# Patient Record
Sex: Female | Born: 1935 | Race: Asian | Hispanic: No | Smoking: Never smoker
Health system: Southern US, Community
[De-identification: ages and names within clinical notes are randomized; demographics above are authoritative.]

## PROBLEM LIST (undated history)

## (undated) DIAGNOSIS — M545 Low back pain, unspecified: Secondary | ICD-10-CM

## (undated) DIAGNOSIS — IMO0001 Reserved for inherently not codable concepts without codable children: Secondary | ICD-10-CM

## (undated) DIAGNOSIS — Z90710 Acquired absence of both cervix and uterus: Secondary | ICD-10-CM

## (undated) DIAGNOSIS — B1910 Unspecified viral hepatitis B without hepatic coma: Secondary | ICD-10-CM

## (undated) DIAGNOSIS — M4800 Spinal stenosis, site unspecified: Secondary | ICD-10-CM

## (undated) DIAGNOSIS — J449 Chronic obstructive pulmonary disease, unspecified: Secondary | ICD-10-CM

## (undated) DIAGNOSIS — E785 Hyperlipidemia, unspecified: Secondary | ICD-10-CM

## (undated) DIAGNOSIS — K5790 Diverticulosis of intestine, part unspecified, without perforation or abscess without bleeding: Secondary | ICD-10-CM

## (undated) DIAGNOSIS — N390 Urinary tract infection, site not specified: Secondary | ICD-10-CM

## (undated) DIAGNOSIS — I509 Heart failure, unspecified: Secondary | ICD-10-CM

## (undated) DIAGNOSIS — N184 Chronic kidney disease, stage 4 (severe): Secondary | ICD-10-CM

## (undated) DIAGNOSIS — I10 Essential (primary) hypertension: Secondary | ICD-10-CM

## (undated) DIAGNOSIS — N879 Dysplasia of cervix uteri, unspecified: Secondary | ICD-10-CM

## (undated) DIAGNOSIS — I2510 Atherosclerotic heart disease of native coronary artery without angina pectoris: Secondary | ICD-10-CM

## (undated) DIAGNOSIS — G8929 Other chronic pain: Secondary | ICD-10-CM

## (undated) DIAGNOSIS — K219 Gastro-esophageal reflux disease without esophagitis: Secondary | ICD-10-CM

## (undated) DIAGNOSIS — N952 Postmenopausal atrophic vaginitis: Secondary | ICD-10-CM

## (undated) DIAGNOSIS — E119 Type 2 diabetes mellitus without complications: Secondary | ICD-10-CM

## (undated) HISTORY — PX: BACK SURGERY: SHX140

## (undated) HISTORY — DX: Gastro-esophageal reflux disease without esophagitis: K21.9

## (undated) HISTORY — DX: Hyperlipidemia, unspecified: E78.5

## (undated) HISTORY — PX: CARDIAC CATHETERIZATION: SHX172

## (undated) HISTORY — DX: Urinary tract infection, site not specified: N39.0

## (undated) HISTORY — DX: Postmenopausal atrophic vaginitis: N95.2

## (undated) HISTORY — DX: Unspecified viral hepatitis B without hepatic coma: B19.10

## (undated) HISTORY — DX: Type 2 diabetes mellitus without complications: E11.9

## (undated) HISTORY — DX: Acquired absence of both cervix and uterus: Z90.710

## (undated) HISTORY — PX: TOTAL ABDOMINAL HYSTERECTOMY: SHX209

## (undated) HISTORY — DX: Spinal stenosis, site unspecified: M48.00

## (undated) HISTORY — DX: Dysplasia of cervix uteri, unspecified: N87.9

## (undated) HISTORY — DX: Atherosclerotic heart disease of native coronary artery without angina pectoris: I25.10

## (undated) HISTORY — DX: Essential (primary) hypertension: I10

---

## 1997-11-20 ENCOUNTER — Ambulatory Visit: Admission: RE | Admit: 1997-11-20 | Discharge: 1997-11-20 | Payer: Self-pay | Admitting: Gynecologic Oncology

## 1998-05-23 ENCOUNTER — Encounter: Payer: Self-pay | Admitting: Emergency Medicine

## 1998-05-23 ENCOUNTER — Emergency Department (HOSPITAL_COMMUNITY): Admission: EM | Admit: 1998-05-23 | Discharge: 1998-05-23 | Payer: Self-pay | Admitting: Emergency Medicine

## 1998-05-24 ENCOUNTER — Encounter: Payer: Self-pay | Admitting: Emergency Medicine

## 1998-07-02 ENCOUNTER — Ambulatory Visit (HOSPITAL_COMMUNITY): Admission: RE | Admit: 1998-07-02 | Discharge: 1998-07-02 | Payer: Self-pay | Admitting: Orthopedic Surgery

## 1998-08-05 ENCOUNTER — Ambulatory Visit: Admission: RE | Admit: 1998-08-05 | Discharge: 1998-08-05 | Payer: Self-pay | Admitting: Gynecologic Oncology

## 1998-08-06 ENCOUNTER — Other Ambulatory Visit: Admission: RE | Admit: 1998-08-06 | Discharge: 1998-08-06 | Payer: Self-pay | Admitting: Gynecologic Oncology

## 1998-10-15 ENCOUNTER — Emergency Department (HOSPITAL_COMMUNITY): Admission: EM | Admit: 1998-10-15 | Discharge: 1998-10-15 | Payer: Self-pay | Admitting: Emergency Medicine

## 1998-10-15 ENCOUNTER — Encounter: Payer: Self-pay | Admitting: Emergency Medicine

## 1998-10-20 ENCOUNTER — Encounter: Admission: RE | Admit: 1998-10-20 | Discharge: 1998-10-20 | Payer: Self-pay | Admitting: Internal Medicine

## 1999-05-07 ENCOUNTER — Encounter: Admission: RE | Admit: 1999-05-07 | Discharge: 1999-05-07 | Payer: Self-pay | Admitting: Hematology and Oncology

## 1999-05-21 ENCOUNTER — Encounter: Admission: RE | Admit: 1999-05-21 | Discharge: 1999-05-21 | Payer: Self-pay | Admitting: Internal Medicine

## 1999-05-25 ENCOUNTER — Encounter: Admission: RE | Admit: 1999-05-25 | Discharge: 1999-05-25 | Payer: Self-pay | Admitting: Internal Medicine

## 1999-06-10 ENCOUNTER — Encounter: Admission: RE | Admit: 1999-06-10 | Discharge: 1999-06-10 | Payer: Self-pay | Admitting: Internal Medicine

## 1999-08-06 ENCOUNTER — Encounter: Admission: RE | Admit: 1999-08-06 | Discharge: 1999-08-06 | Payer: Self-pay | Admitting: Internal Medicine

## 1999-08-18 ENCOUNTER — Other Ambulatory Visit: Admission: RE | Admit: 1999-08-18 | Discharge: 1999-08-18 | Payer: Self-pay | Admitting: Gynecologic Oncology

## 1999-08-18 ENCOUNTER — Ambulatory Visit: Admission: RE | Admit: 1999-08-18 | Discharge: 1999-08-18 | Payer: Self-pay | Admitting: Gynecologic Oncology

## 2000-01-14 ENCOUNTER — Ambulatory Visit (HOSPITAL_COMMUNITY): Admission: RE | Admit: 2000-01-14 | Discharge: 2000-01-14 | Payer: Self-pay | Admitting: Orthopedic Surgery

## 2000-01-14 ENCOUNTER — Encounter: Payer: Self-pay | Admitting: Orthopedic Surgery

## 2000-02-01 HISTORY — PX: LUMBAR LAMINECTOMY/DECOMPRESSION MICRODISCECTOMY: SHX5026

## 2000-02-26 ENCOUNTER — Encounter: Payer: Self-pay | Admitting: Orthopedic Surgery

## 2000-02-29 ENCOUNTER — Inpatient Hospital Stay (HOSPITAL_COMMUNITY): Admission: RE | Admit: 2000-02-29 | Discharge: 2000-03-03 | Payer: Self-pay | Admitting: Orthopedic Surgery

## 2000-02-29 ENCOUNTER — Encounter: Payer: Self-pay | Admitting: Orthopedic Surgery

## 2000-06-21 ENCOUNTER — Ambulatory Visit (HOSPITAL_COMMUNITY): Admission: RE | Admit: 2000-06-21 | Discharge: 2000-06-21 | Payer: Self-pay

## 2000-06-21 ENCOUNTER — Encounter: Admission: RE | Admit: 2000-06-21 | Discharge: 2000-06-21 | Payer: Self-pay

## 2001-01-11 ENCOUNTER — Encounter: Admission: RE | Admit: 2001-01-11 | Discharge: 2001-01-11 | Payer: Self-pay | Admitting: Internal Medicine

## 2001-01-16 ENCOUNTER — Encounter: Admission: RE | Admit: 2001-01-16 | Discharge: 2001-01-16 | Payer: Self-pay | Admitting: Internal Medicine

## 2001-02-13 ENCOUNTER — Encounter: Admission: RE | Admit: 2001-02-13 | Discharge: 2001-02-13 | Payer: Self-pay | Admitting: Internal Medicine

## 2001-02-23 ENCOUNTER — Encounter: Admission: RE | Admit: 2001-02-23 | Discharge: 2001-02-23 | Payer: Self-pay | Admitting: *Deleted

## 2001-03-08 ENCOUNTER — Ambulatory Visit (HOSPITAL_COMMUNITY): Admission: RE | Admit: 2001-03-08 | Discharge: 2001-03-08 | Payer: Self-pay | Admitting: *Deleted

## 2001-03-16 ENCOUNTER — Encounter: Admission: RE | Admit: 2001-03-16 | Discharge: 2001-03-16 | Payer: Self-pay | Admitting: Internal Medicine

## 2001-05-22 ENCOUNTER — Encounter: Admission: RE | Admit: 2001-05-22 | Discharge: 2001-05-22 | Payer: Self-pay | Admitting: Internal Medicine

## 2001-06-12 ENCOUNTER — Emergency Department (HOSPITAL_COMMUNITY): Admission: EM | Admit: 2001-06-12 | Discharge: 2001-06-12 | Payer: Self-pay | Admitting: Emergency Medicine

## 2001-06-12 ENCOUNTER — Encounter: Payer: Self-pay | Admitting: Emergency Medicine

## 2001-08-23 ENCOUNTER — Encounter: Admission: RE | Admit: 2001-08-23 | Discharge: 2001-08-23 | Payer: Self-pay

## 2002-04-17 ENCOUNTER — Encounter: Admission: RE | Admit: 2002-04-17 | Discharge: 2002-04-17 | Payer: Self-pay | Admitting: Internal Medicine

## 2002-10-26 ENCOUNTER — Emergency Department (HOSPITAL_COMMUNITY): Admission: EM | Admit: 2002-10-26 | Discharge: 2002-10-26 | Payer: Self-pay | Admitting: Emergency Medicine

## 2002-10-27 ENCOUNTER — Emergency Department (HOSPITAL_COMMUNITY): Admission: AD | Admit: 2002-10-27 | Discharge: 2002-10-27 | Payer: Self-pay

## 2002-10-28 ENCOUNTER — Emergency Department (HOSPITAL_COMMUNITY): Admission: EM | Admit: 2002-10-28 | Discharge: 2002-10-28 | Payer: Self-pay | Admitting: Emergency Medicine

## 2002-12-29 ENCOUNTER — Emergency Department (HOSPITAL_COMMUNITY): Admission: EM | Admit: 2002-12-29 | Discharge: 2002-12-29 | Payer: Self-pay

## 2003-01-02 ENCOUNTER — Encounter: Admission: RE | Admit: 2003-01-02 | Discharge: 2003-01-02 | Payer: Self-pay | Admitting: Internal Medicine

## 2003-01-07 ENCOUNTER — Emergency Department (HOSPITAL_COMMUNITY): Admission: AD | Admit: 2003-01-07 | Discharge: 2003-01-08 | Payer: Self-pay | Admitting: Emergency Medicine

## 2003-01-07 ENCOUNTER — Encounter: Payer: Self-pay | Admitting: Emergency Medicine

## 2003-01-25 ENCOUNTER — Encounter: Admission: RE | Admit: 2003-01-25 | Discharge: 2003-01-25 | Payer: Self-pay | Admitting: Internal Medicine

## 2003-02-04 ENCOUNTER — Encounter: Admission: RE | Admit: 2003-02-04 | Discharge: 2003-02-04 | Payer: Self-pay | Admitting: Internal Medicine

## 2003-03-07 ENCOUNTER — Encounter: Admission: RE | Admit: 2003-03-07 | Discharge: 2003-03-07 | Payer: Self-pay | Admitting: Internal Medicine

## 2003-07-08 ENCOUNTER — Emergency Department (HOSPITAL_COMMUNITY): Admission: EM | Admit: 2003-07-08 | Discharge: 2003-07-08 | Payer: Self-pay | Admitting: Family Medicine

## 2003-07-09 ENCOUNTER — Emergency Department (HOSPITAL_COMMUNITY): Admission: EM | Admit: 2003-07-09 | Discharge: 2003-07-10 | Payer: Self-pay | Admitting: Family Medicine

## 2003-07-12 ENCOUNTER — Ambulatory Visit (HOSPITAL_COMMUNITY): Admission: RE | Admit: 2003-07-12 | Discharge: 2003-07-12 | Payer: Self-pay | Admitting: Emergency Medicine

## 2003-08-04 ENCOUNTER — Encounter: Admission: RE | Admit: 2003-08-04 | Discharge: 2003-08-04 | Payer: Self-pay | Admitting: Orthopedic Surgery

## 2003-08-29 ENCOUNTER — Encounter: Admission: RE | Admit: 2003-08-29 | Discharge: 2003-08-29 | Payer: Self-pay | Admitting: Internal Medicine

## 2004-02-08 ENCOUNTER — Emergency Department (HOSPITAL_COMMUNITY): Admission: EM | Admit: 2004-02-08 | Discharge: 2004-02-09 | Payer: Self-pay | Admitting: Emergency Medicine

## 2004-09-17 ENCOUNTER — Emergency Department (HOSPITAL_COMMUNITY): Admission: EM | Admit: 2004-09-17 | Discharge: 2004-09-17 | Payer: Self-pay | Admitting: Family Medicine

## 2004-09-22 ENCOUNTER — Ambulatory Visit: Payer: Self-pay | Admitting: Internal Medicine

## 2004-09-25 ENCOUNTER — Ambulatory Visit: Payer: Self-pay | Admitting: Internal Medicine

## 2004-11-12 ENCOUNTER — Ambulatory Visit: Payer: Self-pay | Admitting: Internal Medicine

## 2004-11-25 ENCOUNTER — Ambulatory Visit: Payer: Self-pay | Admitting: Internal Medicine

## 2004-12-02 ENCOUNTER — Ambulatory Visit: Payer: Self-pay | Admitting: Internal Medicine

## 2004-12-09 ENCOUNTER — Ambulatory Visit: Payer: Self-pay | Admitting: Internal Medicine

## 2005-01-21 ENCOUNTER — Ambulatory Visit: Payer: Self-pay | Admitting: Internal Medicine

## 2005-03-09 ENCOUNTER — Emergency Department (HOSPITAL_COMMUNITY): Admission: EM | Admit: 2005-03-09 | Discharge: 2005-03-09 | Payer: Self-pay | Admitting: Family Medicine

## 2005-06-29 ENCOUNTER — Emergency Department (HOSPITAL_COMMUNITY): Admission: EM | Admit: 2005-06-29 | Discharge: 2005-06-29 | Payer: Self-pay | Admitting: *Deleted

## 2005-06-30 ENCOUNTER — Ambulatory Visit: Payer: Self-pay | Admitting: Internal Medicine

## 2005-07-14 ENCOUNTER — Ambulatory Visit (HOSPITAL_COMMUNITY): Admission: RE | Admit: 2005-07-14 | Discharge: 2005-07-14 | Payer: Self-pay | Admitting: Nephrology

## 2005-07-14 ENCOUNTER — Encounter: Payer: Self-pay | Admitting: Cardiology

## 2005-07-14 ENCOUNTER — Ambulatory Visit: Payer: Self-pay | Admitting: Cardiology

## 2005-08-15 ENCOUNTER — Emergency Department (HOSPITAL_COMMUNITY): Admission: EM | Admit: 2005-08-15 | Discharge: 2005-08-15 | Payer: Self-pay | Admitting: Emergency Medicine

## 2005-09-17 ENCOUNTER — Ambulatory Visit: Payer: Self-pay | Admitting: Internal Medicine

## 2005-10-22 ENCOUNTER — Emergency Department (HOSPITAL_COMMUNITY): Admission: EM | Admit: 2005-10-22 | Discharge: 2005-10-22 | Payer: Self-pay | Admitting: Emergency Medicine

## 2005-11-24 ENCOUNTER — Ambulatory Visit: Payer: Self-pay | Admitting: Hospitalist

## 2005-11-25 ENCOUNTER — Encounter (INDEPENDENT_AMBULATORY_CARE_PROVIDER_SITE_OTHER): Payer: Self-pay | Admitting: Internal Medicine

## 2005-11-25 LAB — CONVERTED CEMR LAB: Microalbumin U total vol: 9.94 mg/L

## 2005-12-13 ENCOUNTER — Ambulatory Visit: Payer: Self-pay | Admitting: Hospitalist

## 2006-02-23 ENCOUNTER — Ambulatory Visit: Payer: Self-pay | Admitting: Internal Medicine

## 2006-02-23 ENCOUNTER — Encounter (INDEPENDENT_AMBULATORY_CARE_PROVIDER_SITE_OTHER): Payer: Self-pay | Admitting: Internal Medicine

## 2006-02-23 LAB — CONVERTED CEMR LAB
AST: 19 units/L (ref 0–37)
Albumin: 4.3 g/dL (ref 3.5–5.2)
BUN: 21 mg/dL (ref 6–23)
Calcium: 9.4 mg/dL (ref 8.4–10.5)
Chloride: 106 meq/L (ref 96–112)
Glucose, Bld: 197 mg/dL — ABNORMAL HIGH (ref 70–99)
HDL: 46 mg/dL (ref >39)
Potassium: 4.4 meq/L (ref 3.5–5.3)
Sodium: 140 meq/L (ref 135–145)
Total Protein: 7.1 g/dL (ref 6.0–8.3)

## 2006-03-03 ENCOUNTER — Ambulatory Visit: Payer: Self-pay | Admitting: Internal Medicine

## 2006-03-03 ENCOUNTER — Encounter (INDEPENDENT_AMBULATORY_CARE_PROVIDER_SITE_OTHER): Payer: Self-pay | Admitting: Internal Medicine

## 2006-03-03 LAB — CONVERTED CEMR LAB: Hgb A1c MFr Bld: 7.4 %

## 2006-03-17 ENCOUNTER — Ambulatory Visit: Payer: Self-pay | Admitting: Internal Medicine

## 2006-03-18 ENCOUNTER — Ambulatory Visit (HOSPITAL_COMMUNITY): Admission: RE | Admit: 2006-03-18 | Discharge: 2006-03-18 | Payer: Self-pay | Admitting: Internal Medicine

## 2006-03-21 ENCOUNTER — Encounter (INDEPENDENT_AMBULATORY_CARE_PROVIDER_SITE_OTHER): Payer: Self-pay | Admitting: Internal Medicine

## 2006-03-21 DIAGNOSIS — Z205 Contact with and (suspected) exposure to viral hepatitis: Secondary | ICD-10-CM | POA: Insufficient documentation

## 2006-03-21 DIAGNOSIS — I10 Essential (primary) hypertension: Secondary | ICD-10-CM | POA: Insufficient documentation

## 2006-03-21 DIAGNOSIS — K219 Gastro-esophageal reflux disease without esophagitis: Secondary | ICD-10-CM | POA: Insufficient documentation

## 2006-03-21 DIAGNOSIS — E785 Hyperlipidemia, unspecified: Secondary | ICD-10-CM

## 2006-03-21 DIAGNOSIS — N879 Dysplasia of cervix uteri, unspecified: Secondary | ICD-10-CM | POA: Insufficient documentation

## 2006-03-21 DIAGNOSIS — E1169 Type 2 diabetes mellitus with other specified complication: Secondary | ICD-10-CM

## 2006-05-17 ENCOUNTER — Telehealth (INDEPENDENT_AMBULATORY_CARE_PROVIDER_SITE_OTHER): Payer: Self-pay | Admitting: *Deleted

## 2006-06-29 ENCOUNTER — Telehealth: Payer: Self-pay | Admitting: *Deleted

## 2006-08-02 DIAGNOSIS — I2510 Atherosclerotic heart disease of native coronary artery without angina pectoris: Secondary | ICD-10-CM

## 2006-08-02 HISTORY — DX: Atherosclerotic heart disease of native coronary artery without angina pectoris: I25.10

## 2006-08-02 HISTORY — PX: CORONARY ARTERY BYPASS GRAFT: SHX141

## 2006-08-03 ENCOUNTER — Encounter (INDEPENDENT_AMBULATORY_CARE_PROVIDER_SITE_OTHER): Payer: Self-pay | Admitting: Internal Medicine

## 2006-08-03 ENCOUNTER — Ambulatory Visit (HOSPITAL_COMMUNITY): Admission: RE | Admit: 2006-08-03 | Discharge: 2006-08-03 | Payer: Self-pay | Admitting: Internal Medicine

## 2006-08-03 ENCOUNTER — Ambulatory Visit: Payer: Self-pay | Admitting: Hospitalist

## 2006-08-03 DIAGNOSIS — R05 Cough: Secondary | ICD-10-CM

## 2006-08-03 LAB — CONVERTED CEMR LAB
Blood Glucose, Fingerstick: 171
Hgb A1c MFr Bld: 8.3 %

## 2006-08-04 ENCOUNTER — Telehealth (INDEPENDENT_AMBULATORY_CARE_PROVIDER_SITE_OTHER): Payer: Self-pay | Admitting: *Deleted

## 2006-08-05 ENCOUNTER — Encounter (INDEPENDENT_AMBULATORY_CARE_PROVIDER_SITE_OTHER): Payer: Self-pay | Admitting: Internal Medicine

## 2006-08-10 ENCOUNTER — Encounter (INDEPENDENT_AMBULATORY_CARE_PROVIDER_SITE_OTHER): Payer: Self-pay | Admitting: Internal Medicine

## 2006-08-10 ENCOUNTER — Ambulatory Visit: Payer: Self-pay | Admitting: Internal Medicine

## 2006-08-10 LAB — CONVERTED CEMR LAB
CO2: 24 meq/L (ref 19–32)
Calcium: 9 mg/dL (ref 8.4–10.5)
Creatinine, Ser: 1.16 mg/dL (ref 0.40–1.20)
Sodium: 141 meq/L (ref 135–145)

## 2006-08-16 ENCOUNTER — Inpatient Hospital Stay (HOSPITAL_COMMUNITY): Admission: EM | Admit: 2006-08-16 | Discharge: 2006-08-22 | Payer: Self-pay | Admitting: *Deleted

## 2006-08-16 ENCOUNTER — Ambulatory Visit: Payer: Self-pay | Admitting: Cardiology

## 2006-08-16 ENCOUNTER — Encounter: Payer: Self-pay | Admitting: Vascular Surgery

## 2006-08-16 ENCOUNTER — Ambulatory Visit: Payer: Self-pay | Admitting: Internal Medicine

## 2006-08-16 ENCOUNTER — Ambulatory Visit: Payer: Self-pay | Admitting: Cardiothoracic Surgery

## 2006-08-17 ENCOUNTER — Telehealth (INDEPENDENT_AMBULATORY_CARE_PROVIDER_SITE_OTHER): Payer: Self-pay | Admitting: *Deleted

## 2006-08-30 ENCOUNTER — Encounter (INDEPENDENT_AMBULATORY_CARE_PROVIDER_SITE_OTHER): Payer: Self-pay | Admitting: *Deleted

## 2006-09-04 ENCOUNTER — Ambulatory Visit: Payer: Self-pay | Admitting: Internal Medicine

## 2006-09-04 ENCOUNTER — Inpatient Hospital Stay (HOSPITAL_COMMUNITY): Admission: EM | Admit: 2006-09-04 | Discharge: 2006-09-08 | Payer: Self-pay | Admitting: Emergency Medicine

## 2006-09-04 ENCOUNTER — Ambulatory Visit: Payer: Self-pay | Admitting: Cardiology

## 2006-09-05 ENCOUNTER — Encounter: Payer: Self-pay | Admitting: Cardiology

## 2006-09-14 ENCOUNTER — Encounter (INDEPENDENT_AMBULATORY_CARE_PROVIDER_SITE_OTHER): Payer: Self-pay | Admitting: Internal Medicine

## 2006-09-29 ENCOUNTER — Ambulatory Visit: Payer: Self-pay | Admitting: Internal Medicine

## 2006-09-29 DIAGNOSIS — Z951 Presence of aortocoronary bypass graft: Secondary | ICD-10-CM | POA: Insufficient documentation

## 2006-10-07 ENCOUNTER — Encounter (INDEPENDENT_AMBULATORY_CARE_PROVIDER_SITE_OTHER): Payer: Self-pay | Admitting: Internal Medicine

## 2006-10-13 ENCOUNTER — Ambulatory Visit (HOSPITAL_COMMUNITY): Admission: RE | Admit: 2006-10-13 | Discharge: 2006-10-13 | Payer: Self-pay | Admitting: Internal Medicine

## 2006-10-13 ENCOUNTER — Ambulatory Visit: Payer: Self-pay | Admitting: Internal Medicine

## 2006-10-13 ENCOUNTER — Encounter (INDEPENDENT_AMBULATORY_CARE_PROVIDER_SITE_OTHER): Payer: Self-pay | Admitting: Dermatology

## 2006-10-13 LAB — CONVERTED CEMR LAB
Blood Glucose, Fingerstick: 185
Hemoglobin: 12.2 g/dL (ref 12.0–15.0)
Lymphocytes Relative: 24 % (ref 12–46)
Lymphs Abs: 1.4 10*3/uL (ref 0.7–3.3)
MCHC: 30.9 g/dL (ref 30.0–36.0)
Monocytes Absolute: 0.5 10*3/uL (ref 0.2–0.7)
Monocytes Relative: 9 % (ref 3–11)
Neutro Abs: 3.7 10*3/uL (ref 1.7–7.7)
Neutrophils Relative %: 64 % (ref 43–77)
RBC: 4.12 M/uL (ref 3.87–5.11)
WBC: 5.9 10*3/uL (ref 4.0–10.5)

## 2006-10-17 ENCOUNTER — Encounter (INDEPENDENT_AMBULATORY_CARE_PROVIDER_SITE_OTHER): Payer: Self-pay | Admitting: Internal Medicine

## 2006-10-17 ENCOUNTER — Ambulatory Visit: Payer: Self-pay | Admitting: Cardiology

## 2006-10-27 ENCOUNTER — Encounter (INDEPENDENT_AMBULATORY_CARE_PROVIDER_SITE_OTHER): Payer: Self-pay | Admitting: Dermatology

## 2006-10-27 ENCOUNTER — Ambulatory Visit: Payer: Self-pay | Admitting: Internal Medicine

## 2006-11-03 ENCOUNTER — Ambulatory Visit: Payer: Self-pay | Admitting: Internal Medicine

## 2006-11-03 LAB — CONVERTED CEMR LAB: Blood Glucose, Fingerstick: 445

## 2006-11-10 ENCOUNTER — Encounter (INDEPENDENT_AMBULATORY_CARE_PROVIDER_SITE_OTHER): Payer: Self-pay | Admitting: Internal Medicine

## 2006-11-15 ENCOUNTER — Encounter (INDEPENDENT_AMBULATORY_CARE_PROVIDER_SITE_OTHER): Payer: Self-pay | Admitting: Internal Medicine

## 2007-01-24 ENCOUNTER — Telehealth (INDEPENDENT_AMBULATORY_CARE_PROVIDER_SITE_OTHER): Payer: Self-pay | Admitting: *Deleted

## 2007-02-20 ENCOUNTER — Ambulatory Visit: Payer: Self-pay | Admitting: Cardiology

## 2007-02-27 ENCOUNTER — Telehealth (INDEPENDENT_AMBULATORY_CARE_PROVIDER_SITE_OTHER): Payer: Self-pay | Admitting: Internal Medicine

## 2007-03-24 ENCOUNTER — Telehealth: Payer: Self-pay | Admitting: *Deleted

## 2007-04-06 ENCOUNTER — Emergency Department (HOSPITAL_COMMUNITY): Admission: EM | Admit: 2007-04-06 | Discharge: 2007-04-06 | Payer: Self-pay | Admitting: Emergency Medicine

## 2007-04-17 ENCOUNTER — Telehealth (INDEPENDENT_AMBULATORY_CARE_PROVIDER_SITE_OTHER): Payer: Self-pay | Admitting: *Deleted

## 2007-05-10 ENCOUNTER — Telehealth (INDEPENDENT_AMBULATORY_CARE_PROVIDER_SITE_OTHER): Payer: Self-pay | Admitting: Internal Medicine

## 2007-06-16 ENCOUNTER — Telehealth (INDEPENDENT_AMBULATORY_CARE_PROVIDER_SITE_OTHER): Payer: Self-pay | Admitting: Internal Medicine

## 2007-07-05 ENCOUNTER — Encounter (INDEPENDENT_AMBULATORY_CARE_PROVIDER_SITE_OTHER): Payer: Self-pay | Admitting: Internal Medicine

## 2007-07-12 ENCOUNTER — Telehealth (INDEPENDENT_AMBULATORY_CARE_PROVIDER_SITE_OTHER): Payer: Self-pay | Admitting: Internal Medicine

## 2007-07-26 ENCOUNTER — Ambulatory Visit: Payer: Self-pay | Admitting: *Deleted

## 2007-07-26 LAB — CONVERTED CEMR LAB
Blood Glucose, Fingerstick: 352
Hgb A1c MFr Bld: 9.1 %

## 2007-08-01 ENCOUNTER — Encounter (INDEPENDENT_AMBULATORY_CARE_PROVIDER_SITE_OTHER): Payer: Self-pay | Admitting: Internal Medicine

## 2007-08-08 ENCOUNTER — Ambulatory Visit: Payer: Self-pay | Admitting: Internal Medicine

## 2007-08-08 ENCOUNTER — Encounter (INDEPENDENT_AMBULATORY_CARE_PROVIDER_SITE_OTHER): Payer: Self-pay | Admitting: Internal Medicine

## 2007-08-08 LAB — CONVERTED CEMR LAB: Blood Glucose, Fingerstick: 257

## 2007-08-10 LAB — CONVERTED CEMR LAB
AST: 16 units/L (ref 0–37)
Albumin: 4.3 g/dL (ref 3.5–5.2)
Alkaline Phosphatase: 93 units/L (ref 39–117)
BUN: 24 mg/dL — ABNORMAL HIGH (ref 6–23)
Creatinine, Ser: 1.15 mg/dL (ref 0.40–1.20)
Creatinine, Urine: 80.8 mg/dL
Glucose, Bld: 204 mg/dL — ABNORMAL HIGH (ref 70–99)
HDL: 34 mg/dL — ABNORMAL LOW (ref >39)
LDL Cholesterol: 81 mg/dL (ref 0–99)
Microalb, Ur: 12.4 mg/dL — ABNORMAL HIGH (ref 0.00–1.89)
Potassium: 4.1 meq/L (ref 3.5–5.3)
Total CHOL/HDL Ratio: 4.4
Triglycerides: 179 mg/dL — ABNORMAL HIGH (ref <150)

## 2007-08-14 ENCOUNTER — Ambulatory Visit (HOSPITAL_COMMUNITY): Admission: RE | Admit: 2007-08-14 | Discharge: 2007-08-14 | Payer: Self-pay | Admitting: Internal Medicine

## 2007-08-29 ENCOUNTER — Emergency Department (HOSPITAL_COMMUNITY): Admission: EM | Admit: 2007-08-29 | Discharge: 2007-08-29 | Payer: Self-pay | Admitting: Emergency Medicine

## 2007-09-12 ENCOUNTER — Encounter (INDEPENDENT_AMBULATORY_CARE_PROVIDER_SITE_OTHER): Payer: Self-pay | Admitting: Internal Medicine

## 2007-09-12 ENCOUNTER — Ambulatory Visit: Payer: Self-pay | Admitting: Internal Medicine

## 2007-09-12 LAB — CONVERTED CEMR LAB: Blood Glucose, Fingerstick: 278

## 2007-09-29 ENCOUNTER — Encounter (INDEPENDENT_AMBULATORY_CARE_PROVIDER_SITE_OTHER): Payer: Self-pay | Admitting: Internal Medicine

## 2007-10-02 ENCOUNTER — Ambulatory Visit: Payer: Self-pay | Admitting: *Deleted

## 2007-10-02 LAB — CONVERTED CEMR LAB

## 2008-03-01 ENCOUNTER — Ambulatory Visit: Payer: Self-pay | Admitting: Cardiology

## 2008-03-19 ENCOUNTER — Encounter (INDEPENDENT_AMBULATORY_CARE_PROVIDER_SITE_OTHER): Payer: Self-pay | Admitting: Internal Medicine

## 2008-03-19 ENCOUNTER — Telehealth (INDEPENDENT_AMBULATORY_CARE_PROVIDER_SITE_OTHER): Payer: Self-pay | Admitting: Internal Medicine

## 2008-03-27 ENCOUNTER — Encounter (INDEPENDENT_AMBULATORY_CARE_PROVIDER_SITE_OTHER): Payer: Self-pay | Admitting: Internal Medicine

## 2008-03-27 ENCOUNTER — Ambulatory Visit (HOSPITAL_COMMUNITY): Admission: RE | Admit: 2008-03-27 | Discharge: 2008-03-27 | Payer: Self-pay | Admitting: *Deleted

## 2008-03-27 ENCOUNTER — Ambulatory Visit: Payer: Self-pay | Admitting: *Deleted

## 2008-03-27 LAB — CONVERTED CEMR LAB
Bilirubin Urine: NEGATIVE
Blood Glucose, Fingerstick: 292
CO2: 23 meq/L (ref 19–32)
Calcium: 9.7 mg/dL (ref 8.4–10.5)
Cholesterol: 196 mg/dL (ref 0–200)
HDL: 47 mg/dL (ref >39)
Hemoglobin, Urine: NEGATIVE
Ketones, ur: NEGATIVE mg/dL
Potassium: 4.5 meq/L (ref 3.5–5.3)
Protein, ur: NEGATIVE mg/dL
Sodium: 138 meq/L (ref 135–145)
Total CHOL/HDL Ratio: 4.2
Urine Glucose: >1000 mg/dL — AB
Urobilinogen, UA: 0.2 (ref 0.0–1.0)
VLDL: 45 mg/dL — ABNORMAL HIGH (ref 0–40)

## 2008-03-31 ENCOUNTER — Ambulatory Visit: Payer: Self-pay | Admitting: Cardiology

## 2008-04-01 ENCOUNTER — Inpatient Hospital Stay (HOSPITAL_COMMUNITY): Admission: EM | Admit: 2008-04-01 | Discharge: 2008-04-03 | Payer: Self-pay | Admitting: Emergency Medicine

## 2008-04-03 ENCOUNTER — Ambulatory Visit: Payer: Self-pay | Admitting: Internal Medicine

## 2008-04-10 ENCOUNTER — Ambulatory Visit: Payer: Self-pay | Admitting: Internal Medicine

## 2008-04-23 ENCOUNTER — Ambulatory Visit: Payer: Self-pay | Admitting: Internal Medicine

## 2008-04-23 ENCOUNTER — Encounter (INDEPENDENT_AMBULATORY_CARE_PROVIDER_SITE_OTHER): Payer: Self-pay | Admitting: Internal Medicine

## 2008-04-23 LAB — CONVERTED CEMR LAB
BUN: 26 mg/dL — ABNORMAL HIGH (ref 6–23)
CO2: 22 meq/L (ref 19–32)
Eosinophils Relative: 1 % (ref 0–5)
Glucose, Bld: 299 mg/dL — ABNORMAL HIGH (ref 70–99)
HCT: 42.2 % (ref 36.0–46.0)
Hemoglobin: 13.3 g/dL (ref 12.0–15.0)
Lymphocytes Relative: 11 % — ABNORMAL LOW (ref 12–46)
Lymphs Abs: 0.8 10*3/uL (ref 0.7–4.0)
Monocytes Absolute: 0.6 10*3/uL (ref 0.1–1.0)
Monocytes Relative: 8 % (ref 3–12)
Platelets: 153 10*3/uL (ref 150–400)
Protein, ur: NEGATIVE mg/dL
RBC: 4.5 M/uL (ref 3.87–5.11)
Sodium: 141 meq/L (ref 135–145)
Total Bilirubin: 0.8 mg/dL (ref 0.3–1.2)
Total Protein: 7 g/dL (ref 6.0–8.3)
Urine Glucose: >1000 mg/dL — AB
Urobilinogen, UA: 0.2 (ref 0.0–1.0)
WBC: 7.1 10*3/uL (ref 4.0–10.5)

## 2008-04-29 ENCOUNTER — Telehealth (INDEPENDENT_AMBULATORY_CARE_PROVIDER_SITE_OTHER): Payer: Self-pay | Admitting: *Deleted

## 2008-05-14 ENCOUNTER — Ambulatory Visit: Payer: Self-pay | Admitting: Infectious Disease

## 2008-05-14 ENCOUNTER — Encounter (INDEPENDENT_AMBULATORY_CARE_PROVIDER_SITE_OTHER): Payer: Self-pay | Admitting: Internal Medicine

## 2008-05-19 ENCOUNTER — Encounter (INDEPENDENT_AMBULATORY_CARE_PROVIDER_SITE_OTHER): Payer: Self-pay | Admitting: Internal Medicine

## 2008-05-21 ENCOUNTER — Encounter (INDEPENDENT_AMBULATORY_CARE_PROVIDER_SITE_OTHER): Payer: Self-pay | Admitting: Internal Medicine

## 2008-05-21 ENCOUNTER — Ambulatory Visit: Payer: Self-pay | Admitting: Infectious Disease

## 2008-05-21 LAB — CONVERTED CEMR LAB
BUN: 23 mg/dL (ref 6–23)
CO2: 22 meq/L (ref 19–32)
Calcium: 9.2 mg/dL (ref 8.4–10.5)
Chloride: 105 meq/L (ref 96–112)
Cholesterol: 185 mg/dL (ref 0–200)
Creatinine, Ser: 1.06 mg/dL (ref 0.40–1.20)
HDL: 47 mg/dL (ref >39)
Total CHOL/HDL Ratio: 3.9

## 2008-05-29 ENCOUNTER — Encounter (INDEPENDENT_AMBULATORY_CARE_PROVIDER_SITE_OTHER): Payer: Self-pay | Admitting: Internal Medicine

## 2008-05-29 ENCOUNTER — Ambulatory Visit: Payer: Self-pay | Admitting: Internal Medicine

## 2008-05-29 LAB — CONVERTED CEMR LAB
BUN: 20 mg/dL (ref 6–23)
Chloride: 105 meq/L (ref 96–112)
Glucose, Bld: 248 mg/dL — ABNORMAL HIGH (ref 70–99)
Potassium: 5 meq/L (ref 3.5–5.3)

## 2008-06-17 ENCOUNTER — Ambulatory Visit: Payer: Self-pay | Admitting: *Deleted

## 2008-06-17 ENCOUNTER — Encounter (INDEPENDENT_AMBULATORY_CARE_PROVIDER_SITE_OTHER): Payer: Self-pay | Admitting: Internal Medicine

## 2008-06-17 LAB — CONVERTED CEMR LAB: Blood Glucose, Fingerstick: 175

## 2008-08-05 ENCOUNTER — Telehealth: Payer: Self-pay | Admitting: Internal Medicine

## 2008-08-05 ENCOUNTER — Telehealth (INDEPENDENT_AMBULATORY_CARE_PROVIDER_SITE_OTHER): Payer: Self-pay | Admitting: *Deleted

## 2008-09-09 ENCOUNTER — Telehealth (INDEPENDENT_AMBULATORY_CARE_PROVIDER_SITE_OTHER): Payer: Self-pay | Admitting: Internal Medicine

## 2008-09-24 ENCOUNTER — Telehealth (INDEPENDENT_AMBULATORY_CARE_PROVIDER_SITE_OTHER): Payer: Self-pay | Admitting: Internal Medicine

## 2008-10-29 ENCOUNTER — Ambulatory Visit: Payer: Self-pay | Admitting: Cardiology

## 2008-11-26 ENCOUNTER — Emergency Department (HOSPITAL_COMMUNITY): Admission: EM | Admit: 2008-11-26 | Discharge: 2008-11-27 | Payer: Self-pay | Admitting: Emergency Medicine

## 2008-11-30 ENCOUNTER — Emergency Department (HOSPITAL_COMMUNITY): Admission: EM | Admit: 2008-11-30 | Discharge: 2008-11-30 | Payer: Self-pay | Admitting: Emergency Medicine

## 2009-01-02 ENCOUNTER — Telehealth: Payer: Self-pay | Admitting: Internal Medicine

## 2009-02-26 ENCOUNTER — Ambulatory Visit: Payer: Self-pay | Admitting: Internal Medicine

## 2009-02-26 LAB — CONVERTED CEMR LAB
BUN: 21 mg/dL (ref 6–23)
Blood Glucose, Fingerstick: 314
Cholesterol: 161 mg/dL (ref 0–200)
Creatinine, Urine: 40 mg/dL
Hgb A1c MFr Bld: 10.3 %
Microalb, Ur: 5.39 mg/dL — ABNORMAL HIGH (ref 0.00–1.89)
Potassium: 4.2 meq/L (ref 3.5–5.3)
Sodium: 140 meq/L (ref 135–145)
Triglycerides: 226 mg/dL — ABNORMAL HIGH (ref <150)
VLDL: 45 mg/dL — ABNORMAL HIGH (ref 0–40)

## 2009-04-03 ENCOUNTER — Telehealth: Payer: Self-pay | Admitting: Internal Medicine

## 2009-04-29 ENCOUNTER — Encounter (INDEPENDENT_AMBULATORY_CARE_PROVIDER_SITE_OTHER): Payer: Self-pay | Admitting: *Deleted

## 2009-06-02 ENCOUNTER — Ambulatory Visit: Payer: Self-pay | Admitting: Cardiology

## 2009-06-17 ENCOUNTER — Telehealth: Payer: Self-pay | Admitting: Internal Medicine

## 2009-06-25 ENCOUNTER — Ambulatory Visit: Payer: Self-pay | Admitting: Internal Medicine

## 2009-07-03 ENCOUNTER — Telehealth: Payer: Self-pay | Admitting: Internal Medicine

## 2009-07-10 ENCOUNTER — Telehealth: Payer: Self-pay | Admitting: Internal Medicine

## 2009-07-10 ENCOUNTER — Encounter: Payer: Self-pay | Admitting: Internal Medicine

## 2009-07-11 ENCOUNTER — Ambulatory Visit: Payer: Self-pay | Admitting: Infectious Diseases

## 2009-07-23 ENCOUNTER — Telehealth: Payer: Self-pay | Admitting: Internal Medicine

## 2009-07-31 ENCOUNTER — Encounter: Payer: Self-pay | Admitting: Internal Medicine

## 2009-08-01 ENCOUNTER — Ambulatory Visit: Payer: Self-pay | Admitting: Internal Medicine

## 2009-08-01 ENCOUNTER — Telehealth: Payer: Self-pay | Admitting: Internal Medicine

## 2009-08-12 ENCOUNTER — Telehealth: Payer: Self-pay | Admitting: Internal Medicine

## 2009-12-25 ENCOUNTER — Telehealth: Payer: Self-pay | Admitting: Internal Medicine

## 2009-12-28 ENCOUNTER — Encounter: Payer: Self-pay | Admitting: Internal Medicine

## 2009-12-29 ENCOUNTER — Ambulatory Visit (HOSPITAL_COMMUNITY): Admission: RE | Admit: 2009-12-29 | Discharge: 2009-12-29 | Payer: Self-pay | Admitting: Internal Medicine

## 2009-12-29 ENCOUNTER — Ambulatory Visit: Payer: Self-pay | Admitting: Internal Medicine

## 2009-12-29 DIAGNOSIS — N390 Urinary tract infection, site not specified: Secondary | ICD-10-CM

## 2009-12-29 DIAGNOSIS — N185 Chronic kidney disease, stage 5: Secondary | ICD-10-CM | POA: Insufficient documentation

## 2009-12-29 DIAGNOSIS — R093 Abnormal sputum: Secondary | ICD-10-CM | POA: Insufficient documentation

## 2009-12-29 LAB — CONVERTED CEMR LAB
Bilirubin Urine: NEGATIVE
Blood Glucose, Fingerstick: 365
Glucose, Urine, Semiquant: >=1000
Ketones, urine, test strip: NEGATIVE
Nitrite: POSITIVE
Urobilinogen, UA: 0.2
WBC Urine, dipstick: NEGATIVE
pH: 6.5

## 2009-12-30 LAB — CONVERTED CEMR LAB
ALT: 26 units/L (ref 0–35)
AST: 24 units/L (ref 0–37)
Albumin: 4.1 g/dL (ref 3.5–5.2)
Alkaline Phosphatase: 78 units/L (ref 39–117)
Glucose, Bld: 344 mg/dL — ABNORMAL HIGH (ref 70–99)
HCT: 36.8 % (ref 36.0–46.0)
Hemoglobin: 11.9 g/dL — ABNORMAL LOW (ref 12.0–15.0)
MCHC: 32.3 g/dL (ref 30.0–36.0)
MCV: 90 fL (ref >=78.0)
Potassium: 4.2 meq/L (ref 3.5–5.3)
RBC: 4.09 M/uL (ref 3.87–5.11)
RDW: 13.5 % (ref 11.5–15.5)
Sodium: 138 meq/L (ref 135–145)
Total Protein: 6.7 g/dL (ref 6.0–8.3)

## 2009-12-31 ENCOUNTER — Ambulatory Visit: Payer: Self-pay | Admitting: Cardiology

## 2009-12-31 DIAGNOSIS — R0989 Other specified symptoms and signs involving the circulatory and respiratory systems: Secondary | ICD-10-CM

## 2010-01-01 ENCOUNTER — Telehealth: Payer: Self-pay | Admitting: Internal Medicine

## 2010-01-03 ENCOUNTER — Observation Stay (HOSPITAL_COMMUNITY): Admission: EM | Admit: 2010-01-03 | Discharge: 2010-01-05 | Payer: Self-pay | Admitting: Emergency Medicine

## 2010-01-03 ENCOUNTER — Ambulatory Visit: Payer: Self-pay | Admitting: Infectious Diseases

## 2010-01-03 ENCOUNTER — Encounter: Payer: Self-pay | Admitting: Internal Medicine

## 2010-01-05 ENCOUNTER — Encounter: Payer: Self-pay | Admitting: Ophthalmology

## 2010-01-29 ENCOUNTER — Telehealth: Payer: Self-pay | Admitting: Internal Medicine

## 2010-02-05 ENCOUNTER — Emergency Department (HOSPITAL_COMMUNITY): Admission: EM | Admit: 2010-02-05 | Discharge: 2010-02-06 | Payer: Self-pay | Admitting: Emergency Medicine

## 2010-02-06 ENCOUNTER — Encounter: Payer: Self-pay | Admitting: Internal Medicine

## 2010-02-20 ENCOUNTER — Ambulatory Visit: Payer: Self-pay

## 2010-02-20 ENCOUNTER — Encounter: Payer: Self-pay | Admitting: Cardiology

## 2010-03-11 ENCOUNTER — Ambulatory Visit: Payer: Self-pay | Admitting: Internal Medicine

## 2010-03-11 DIAGNOSIS — K591 Functional diarrhea: Secondary | ICD-10-CM | POA: Insufficient documentation

## 2010-05-15 ENCOUNTER — Inpatient Hospital Stay (HOSPITAL_COMMUNITY)
Admission: EM | Admit: 2010-05-15 | Discharge: 2010-05-16 | Payer: Self-pay | Source: Home / Self Care | Attending: Internal Medicine | Admitting: Internal Medicine

## 2010-05-16 ENCOUNTER — Encounter: Payer: Self-pay | Admitting: Internal Medicine

## 2010-05-16 DIAGNOSIS — N185 Chronic kidney disease, stage 5: Secondary | ICD-10-CM

## 2010-05-16 DIAGNOSIS — E1122 Type 2 diabetes mellitus with diabetic chronic kidney disease: Secondary | ICD-10-CM | POA: Insufficient documentation

## 2010-05-18 LAB — BASIC METABOLIC PANEL
BUN: 22 mg/dL (ref 6–23)
CO2: 22 mEq/L (ref 19–32)
Calcium: 9.2 mg/dL (ref 8.4–10.5)
Chloride: 104 mEq/L (ref 96–112)
Creatinine, Ser: 1.55 mg/dL — ABNORMAL HIGH (ref 0.4–1.2)
GFR calc Af Amer: 39 mL/min — ABNORMAL LOW (ref >60)
GFR calc non Af Amer: 32 mL/min — ABNORMAL LOW (ref >60)
Glucose, Bld: 157 mg/dL — ABNORMAL HIGH (ref 70–99)
Potassium: 3.6 mEq/L (ref 3.5–5.1)
Sodium: 137 mEq/L (ref 135–145)

## 2010-05-18 LAB — POCT CARDIAC MARKERS
CKMB, poc: 1.1 ng/mL (ref 1.0–8.0)
Myoglobin, poc: 95 ng/mL (ref 12–200)
Troponin i, poc: <0.05 ng/mL (ref 0.00–0.09)

## 2010-05-18 LAB — MRSA PCR SCREENING: MRSA by PCR: NEGATIVE

## 2010-05-18 LAB — DIFFERENTIAL
Basophils Absolute: 0 10*3/uL (ref 0.0–0.1)
Basophils Relative: 0 % (ref 0–1)
Eosinophils Absolute: 0.2 10*3/uL (ref 0.0–0.7)
Eosinophils Relative: 2 % (ref 0–5)
Lymphocytes Relative: 18 % (ref 12–46)
Lymphs Abs: 1.4 10*3/uL (ref 0.7–4.0)
Monocytes Absolute: 0.5 10*3/uL (ref 0.1–1.0)
Monocytes Relative: 6 % (ref 3–12)
Neutro Abs: 5.8 10*3/uL (ref 1.7–7.7)
Neutrophils Relative %: 75 % (ref 43–77)

## 2010-05-18 LAB — URINALYSIS, ROUTINE W REFLEX MICROSCOPIC
Bilirubin Urine: NEGATIVE
Hgb urine dipstick: NEGATIVE
Ketones, ur: NEGATIVE mg/dL
Nitrite: POSITIVE — AB
Protein, ur: 30 mg/dL — AB
Specific Gravity, Urine: 1.011 (ref 1.005–1.030)
Urine Glucose, Fasting: 250 mg/dL — AB
Urobilinogen, UA: 0.2 mg/dL (ref 0.0–1.0)
pH: 7 (ref 5.0–8.0)

## 2010-05-18 LAB — COMPREHENSIVE METABOLIC PANEL
ALT: 14 U/L (ref 0–35)
AST: 21 U/L (ref 0–37)
Albumin: 4.3 g/dL (ref 3.5–5.2)
Alkaline Phosphatase: 42 U/L (ref 39–117)
BUN: 26 mg/dL — ABNORMAL HIGH (ref 6–23)
CO2: 24 mEq/L (ref 19–32)
Calcium: 9.9 mg/dL (ref 8.4–10.5)
Chloride: 99 mEq/L (ref 96–112)
Creatinine, Ser: 1.32 mg/dL — ABNORMAL HIGH (ref 0.4–1.2)
GFR calc Af Amer: 47 mL/min — ABNORMAL LOW (ref >60)
GFR calc non Af Amer: 39 mL/min — ABNORMAL LOW (ref >60)
Glucose, Bld: 37 mg/dL — CL (ref 70–99)
Potassium: 3.5 mEq/L (ref 3.5–5.1)
Sodium: 137 mEq/L (ref 135–145)
Total Bilirubin: 0.8 mg/dL (ref 0.3–1.2)
Total Protein: 7.6 g/dL (ref 6.0–8.3)

## 2010-05-18 LAB — GLUCOSE, CAPILLARY
Glucose-Capillary: 143 mg/dL — ABNORMAL HIGH (ref 70–99)
Glucose-Capillary: 214 mg/dL — ABNORMAL HIGH (ref 70–99)
Glucose-Capillary: 221 mg/dL — ABNORMAL HIGH (ref 70–99)
Glucose-Capillary: 37 mg/dL — CL (ref 70–99)

## 2010-05-18 LAB — TSH: TSH: 0.496 u[IU]/mL (ref 0.350–4.500)

## 2010-05-18 LAB — CBC
HCT: 36.5 % (ref 36.0–46.0)
Hemoglobin: 11.7 g/dL — ABNORMAL LOW (ref 12.0–15.0)
MCH: 28.8 pg (ref 26.0–34.0)
MCHC: 32.1 g/dL (ref 30.0–36.0)
MCV: 89.9 fL (ref 78.0–100.0)
Platelets: 205 10*3/uL (ref 150–400)
RBC: 4.06 MIL/uL (ref 3.87–5.11)
RDW: 13.9 % (ref 11.5–15.5)
WBC: 7.8 10*3/uL (ref 4.0–10.5)

## 2010-05-18 LAB — HEMOGLOBIN A1C
Hgb A1c MFr Bld: 7.5 % — ABNORMAL HIGH (ref <5.7)
Mean Plasma Glucose: 169 mg/dL — ABNORMAL HIGH (ref <117)

## 2010-05-18 LAB — LIPID PANEL
Cholesterol: 142 mg/dL (ref 0–200)
HDL: 48 mg/dL (ref >39)
LDL Cholesterol: 70 mg/dL (ref 0–99)
Total CHOL/HDL Ratio: 3 RATIO
Triglycerides: 120 mg/dL (ref <150)
VLDL: 24 mg/dL (ref 0–40)

## 2010-05-18 LAB — URINE MICROSCOPIC-ADD ON

## 2010-05-18 LAB — CARDIAC PANEL(CRET KIN+CKTOT+MB+TROPI)
CK, MB: 1.3 ng/mL (ref 0.3–4.0)
Relative Index: INVALID (ref 0.0–2.5)
Total CK: 36 U/L (ref 7–177)
Troponin I: 0.01 ng/mL (ref 0.00–0.06)

## 2010-05-18 LAB — CLOSTRIDIUM DIFFICILE BY PCR: Toxigenic C. Difficile by PCR: NEGATIVE

## 2010-05-18 LAB — CK TOTAL AND CKMB (NOT AT ARMC)
CK, MB: 1.4 ng/mL (ref 0.3–4.0)
Relative Index: INVALID (ref 0.0–2.5)
Total CK: 51 U/L (ref 7–177)

## 2010-05-18 LAB — PROTIME-INR
INR: 0.96 (ref 0.00–1.49)
Prothrombin Time: 13 seconds (ref 11.6–15.2)

## 2010-05-18 LAB — TROPONIN I: Troponin I: 0.01 ng/mL (ref 0.00–0.06)

## 2010-05-18 LAB — SODIUM, URINE, RANDOM: Sodium, Ur: 95 mEq/L

## 2010-05-18 LAB — LIPASE, BLOOD: Lipase: 31 U/L (ref 11–59)

## 2010-05-20 LAB — URINE CULTURE
Colony Count: >=100000
Culture  Setup Time: 201201141730

## 2010-05-24 ENCOUNTER — Encounter: Payer: Self-pay | Admitting: Internal Medicine

## 2010-05-25 LAB — CULTURE, BLOOD (ROUTINE X 2)
Culture  Setup Time: 201201141122
Culture  Setup Time: 201201141122
Culture: NO GROWTH
Culture: NO GROWTH

## 2010-05-29 ENCOUNTER — Telehealth: Payer: Self-pay | Admitting: Internal Medicine

## 2010-06-04 NOTE — Assessment & Plan Note (Signed)
Summary: rov per pt call/lg  Medications Added NITROGLYCERIN 0.4 MG  SUBL (NITROGLYCERIN) take 1 tablet under tongue every 5 min foe a maximum of 3 doses CRESTOR 20 MG TABS (ROSUVASTATIN CALCIUM) 1 by mouth daily      Allergies Added: NKDA  Visit Type:  Follow-up Primary Provider:  Jackson Latino MD  CC:  CAD.  History of Present Illness: The patient returns for followup. Since I last saw her she has done well. She has missed a few appointments including with her primary provider. However, she denies any symptoms consistent with previous angina. She does get some pain in her right upper abdominal or lower rib area. This is sharp and fleeting. She does not have substernal chest pressure, neck or arm discomfort. She doesn't notice any palpitations and has had no presyncope or syncope. She has no PND or orthopnea.  Of note her last lipids demonstrated an HDL of 47, LDL 69 but triglycerides of 226. I do not see a recent hemoglobin A1c. She is not sure how well controlled her blood sugar is.  Current Medications (verified): 1)  Metformin Hcl 500 Mg Tabs (Metformin Hcl) .... Take 1 Tablet By Mouth Two Times A Day 2)  Multivitamins  Tabs (Multiple Vitamin) .... Take 1 Tablet By Mouth Once A Day 3)  Lantus 100 Unit/ml Soln (Insulin Glargine) .... Inject 55 Units Subcutaneously At Bedtime 4)  Cozaar 50 Mg Tabs (Losartan Potassium) .... Take 1 Tablet By Mouth Once A Day 5)  Coreg 25 Mg Tabs (Carvedilol) .... Take 1 Tablet By Mouth Two Times A Day 6)  Plavix 75 Mg Tabs (Clopidogrel Bisulfate) .... Take 1 Tablet By Mouth Once A Day 7)  Aspir-Low 81 Mg Tbec (Aspirin) .... Take 1 Tablet By Mouth Once A Day 8)  Nitroglycerin 0.4 Mg  Subl (Nitroglycerin) .... Take 1 Tablet Under Tongue Every 5 Min Foe A Maximum of 3 Doses 9)  Onetouch Ultrasoft Lancets  Misc (Lancets) .... Use As Directed To Check Blood Sugar At Least Twice A Day 10)  Onetouch Ultra Test  Strp (Glucose Blood) .... Use As Directed 11)   Crestor 20 Mg Tabs (Rosuvastatin Calcium) .Marland Kitchen.. 1 By Mouth Daily  Allergies (verified): No Known Drug Allergies  Past History:  Past Medical History: Reviewed history from 10/28/2008 and no changes required. Diabetes mellitus, type II GERD Hepatitis B Hyperlipidemia Hypertension Cervical dysplasia Atrophic vaginitis Positive PPD in 2008 (Normal CXR) CAD (April 2008 with LIMA to the LAD, SVG to OM, SVG to distal right coronary artery and PDA.  She had repeat cath along with this demonstrating 99% stenosis at the anastomosis of the graft to the PDA.  She was managed medically), Spinal stenosis s/p decompression  Past Surgical History: Reviewed history from 10/28/2008 and no changes required. Back surgery (decompression L3-S1) Hysterectomy (cervical dysplasia) CABG  Review of Systems       As stated in the HPI and negative for all other systems.   Vital Signs:  Patient profile:   77 year old female Height:      61 inches Weight:      112 pounds BMI:     21.24 Pulse rate:   71 / minute Resp:     16 per minute BP sitting:   174 / 83  (right arm)  Vitals Entered By: Marrion Coy, CNA (June 02, 2009 11:18 AM)  Physical Exam  General:  Well developed, well nourished, in no acute distress. Head:  normocephalic and atraumatic Eyes:  PERRLA/EOM  intact; conjunctiva and lids normal. Mouth:  Teeth, gums and palate normal. Oral mucosa normal. Neck:  Neck supple, no JVD. No masses, thyromegaly or abnormal cervical nodes. Chest Wall:  Well healed sternotomy scar. Lungs:  Clear bilaterally to auscultation and percussion. Heart:  Non-displaced PMI, chest non-tender; regular rate and rhythm, S1, S2 without murmurs, rubs or gallops. Carotid upstroke normal, no bruit. Normal abdominal aortic size, no bruits. Femorals normal pulses, no bruits. Pedals normal pulses. No edema, no varicosities. Abdomen:  Bowel sounds positive; abdomen soft and non-tender without masses, organomegaly,  or hernias noted. No hepatosplenomegaly. Msk:  Back normal, normal gait. Muscle strength and tone normal. Extremities:  No clubbing or cyanosis. Neurologic:  Alert and oriented x 3. Skin:  Intact without lesions or rashes. Psych:  Normal affect.   EKG  Procedure date:  06/02/2009  Findings:      sinus rhythm, rate 71, axis within normal limits, intervals within normal limits, old inferior infarct, no acute ST-T wave changes.  Impression & Recommendations:  Problem # 1:  CAD (ICD-414.00) The patient has disease as described. She is being managed medically. No further cardiovascular testing is suggested at this point. She needs continued risk reduction. Orders: EKG w/ Interpretation (93000)  Problem # 2:  HYPERTENSION (ICD-401.9) Her blood pressure was elevated today but for some reason she did not take her medications. It looks like it has been well controlled otherwise. I will make no change her regimen.  Problem # 3:  HYPERLIPIDEMIA (ICD-272.4) Her triglycerides were elevated. However, I suspect her blood sugar is not well controlled. I have asked her to please schedule with her primary physician as she has missed some appointments. Her daughter has not been involved in this. And will begin to take control of this.  Patient Instructions: 1)  Your physician recommends that you schedule a follow-up appointment in: 1 year 2)  Your physician recommends that you continue on your current medications as directed. Please refer to the Current Medication list given to you today. Prescriptions: NITROGLYCERIN 0.4 MG  SUBL (NITROGLYCERIN) take 1 tablet under tongue every 5 min foe a maximum of 3 doses  #25 x 10   Entered by:   Marrion Coy, CNA   Authorized by:   Rollene Rotunda, MD, [facility]   Signed by:   Marrion Coy, CNA on 06/02/2009   Method used:   Electronically to        [facility] [phone]* (retail)       [address]       [place], [place]  [address]       Ph: [phone] or  [phone]       Fax: [phone]   RxID:   [id number]

## 2010-06-04 NOTE — Assessment & Plan Note (Signed)
Summary: rov/new chest pains      Allergies Added: NKDA  Visit Type:  Follow-up Primary Provider:  Jackson Latino MD  CC:  CAD.  History of Present Illness: The patient presents for followup of known coronary disease. Since I last saw her she has had no new cardiovascular complaints. His difficult given the language barrier. However, we spoke through an interpreter phone. She has mostly had problems with weakness, cough productive of green and occasional blood-tinged sputum. She was having some throat discomfort. She has occasional sharp discomfort under her left breast. However, she's not had any symptoms consistent with angina. She has not had substernal chest pressure, neck or arm discomfort. She has not had palpitations, presyncope or syncope. She has had no PND or orthopnea.  Current Medications (verified): 1)  Metformin Hcl 500 Mg Tabs (Metformin Hcl) .... Take 2 Tablets By Mouth Two Times A Day 2)  Multivitamins  Tabs (Multiple Vitamin) .... Take 1 Tablet By Mouth Once A Day 3)  Lantus 100 Unit/ml Soln (Insulin Glargine) .... Inject 55 Units Subcutaneously At Bedtime 4)  Cozaar 100 Mg Tabs (Losartan Potassium) .... Take 1 Tablet By Mouth Daily 5)  Coreg 25 Mg Tabs (Carvedilol) .... Take 1 Tablet By Mouth Two Times A Day 6)  Plavix 75 Mg Tabs (Clopidogrel Bisulfate) .... Take 1 Tablet By Mouth Once A Day 7)  Aspir-Low 81 Mg Tbec (Aspirin) .... Take 1 Tablet By Mouth Once A Day 8)  Nitroglycerin 0.4 Mg  Subl (Nitroglycerin) .... Take 1 Tablet Under Tongue Every 5 Min Foe A Maximum of 3 Doses 9)  Onetouch Ultrasoft Lancets  Misc (Lancets) .... Use As Directed To Check Blood Sugar At Least Twice A Day 10)  Onetouch Ultra Test  Strp (Glucose Blood) .... Use As Directed 11)  Crestor 20 Mg Tabs (Rosuvastatin Calcium) .Marland Kitchen.. 1 By Mouth Daily 12)  Donatussin 02-01-14-100 Mg/63ml Syrp (Pe-Cpm-Dm-Gg) .... Take 10ml By Mouth Every 6 Hours As Needed For Cough 13)  Pepcid 20 Mg Tabs (Famotidine)  .... Take 1 Tablet By Mouth Two Times A Day 14)  Sb Insulin Syringe 31g X 5/16" 1 Ml Misc (Insulin Syringe-Needle U-100) .... Use To Inject Lantus Insulin One Time A Day- Patient Needs Shortest  Needles- 8mm or Smaller 15)  Levaquin 500 Mg Tabs (Levofloxacin) .... Take 1 Tablet By Mouth Daily For 10 Days  Allergies (verified): No Known Drug Allergies  Past History:  Past Medical History: Reviewed history from 10/28/2008 and no changes required. Diabetes mellitus, type II GERD Hepatitis B Hyperlipidemia Hypertension Cervical dysplasia Atrophic vaginitis Positive PPD in 2008 (Normal CXR) CAD (April 2008 with LIMA to the LAD, SVG to OM, SVG to distal right coronary artery and PDA.  She had repeat cath along with this demonstrating 99% stenosis at the anastomosis of the graft to the PDA.  She was managed medically), Spinal stenosis s/p decompression  Past Surgical History: Reviewed history from 12/29/2009 and no changes required. Back surgery (decompression L3-S1) Hysterectomy (cervical dysplasia) CABG - April 2008  Review of Systems       As stated in the HPI and negative for all other systems.   Vital Signs:  Patient profile:   77 year old female Height:      61 inches Weight:      110 pounds BMI:     20.86 Pulse rate:   68 / minute BP sitting:   148 / 78  (right arm) Cuff size:   regular  Vitals Entered By:  Marrion Coy, CNA (December 31, 2009 3:31 PM)  New Orders:     1)  Carotid Duplex (Carotid Duplex)   Physical Exam  General:  Well developed, well nourished, in no acute distress. Head:  normocephalic and atraumatic Eyes:  PERRLA/EOM intact; conjunctiva and lids normal. Mouth:  Teeth, gums and palate normal. Oral mucosa normal. Neck:  Neck supple, no JVD. No masses, thyromegaly or abnormal cervical nodes, positive carotid bruit on the right Chest Wall:  Well-healed sternotomy scar Lungs:  Clear bilaterally to auscultation and percussion. Heart:  S1 and S2  within normal limits, no S3, no S4, no clicks, no rubs Abdomen:  Bowel sounds positive; abdomen soft and non-tender without masses, organomegaly, or hernias noted. No hepatosplenomegaly. Msk:  Back normal, normal gait. Muscle strength and tone normal. Extremities:  No clubbing or cyanosis. Neurologic:  Alert and oriented x 3. Skin:  Intact without lesions or rashes. Cervical Nodes:  no significant adenopathy Psych:  Normal affect.   Impression & Recommendations:  Problem # 1:  CAD (ICD-414.00) She is having no new symptoms. No further cardiovascular testing is suggested. She needs to continue with aggressive risk reduction. I will see her yearly. I will defer management of her lipids to her primary care physician.  Problem # 2:  CAROTID BRUIT (ICD-785.9) I will followup with the carotid Doppler. Orders: Carotid Duplex (Carotid Duplex)  Problem # 3:  ABNORMAL SPUTUM (ICD-786.4) The patient is having a course of antibiotics. However, these were her main complaints. I have lowered he been in communication with her primary physician. They have treated her urinary infection. I have asked him to followup with her cough and other complaints.  Patient Instructions: 1)  Your physician recommends that you schedule a follow-up appointment in: 12 months with Dr Antoine Poche 2)  Your physician recommends that you continue on your current medications as directed. Please refer to the Current Medication list given to you today. 3)  Your physician has requested that you have a carotid duplex. This test is an ultrasound of the carotid arteries in your neck. It looks at blood flow through these arteries that supply the brain with blood. Allow one hour for this exam. There are no restrictions or special instructions.

## 2010-06-04 NOTE — Discharge Summary (Signed)
Summary: Hospital Discharge Update    Hospital Discharge Update:  Date of Admission: 05/16/2010 Date of Discharge: 05/16/2010  Brief Summary:  Patient was admitted for new onset weakness and 3 episodes of diarrhea since friday morning. Her CBG's were found to be in 30's while in ED. Patient was hypothermic in ED and we gave her a dose of antibiotic for asymptomatic bacteriuria. Patient was discharged home tolerating diet well and c.diff negative with a HFU in clinic early next week.  Lab or other results pending at discharge:  urine culture  Labs needed at follow-up: Basic metabolic panel  Other follow-up issues:  1.Patient needs her insulin regimen to be up titrated as per her meter readings as we recently decreased her lantus from 60units to 30 units 2/2 hypoglycemia. Also consider d/c metformin if she is having crt>1.4 consistently. 2. Please follow up urine culture results and enquire about urinary symptoms. Consider treatment if symptomatic. 3. Please obtain a Bmet as patient's creatinine was slightly above baseline at discharge.  Problem list changes:  Changed problem from DIABETES MELLITUS, TYPE II (ICD-250.00) to DIABETES MELLITUS, TYPE II (ICD-250.00)  Medication list changes:  Changed medication from LANTUS 100 UNIT/ML SOLN (INSULIN GLARGINE) Inject 55 units subcutaneously at bedtime to LANTUS 100 UNIT/ML SOLN (INSULIN GLARGINE) Inject 30 units subcutaneously at bedtime  The medication, problem, and allergy lists have been updated.  Please see the dictated discharge summary for details.  Discharge medications:  METFORMIN HCL 500 MG TABS (METFORMIN HCL) Take 2 tablets by mouth two times a day MULTIVITAMINS  TABS (MULTIPLE VITAMIN) Take 1 tablet by mouth once a day LANTUS 100 UNIT/ML SOLN (INSULIN GLARGINE) Inject 30 units subcutaneously at bedtime COZAAR 100 MG TABS (LOSARTAN POTASSIUM) Take 1 tablet by mouth daily COREG 25 MG TABS (CARVEDILOL) Take 1 tablet by mouth two  times a day PLAVIX 75 MG TABS (CLOPIDOGREL BISULFATE) Take 1 tablet by mouth once a day ASPIR-LOW 81 MG TBEC (ASPIRIN) Take 1 tablet by mouth once a day NITROGLYCERIN 0.4 MG  SUBL (NITROGLYCERIN) take 1 tablet under tongue every 5 min foe a maximum of 3 doses ONETOUCH ULTRASOFT LANCETS  MISC (LANCETS) Use as directed to check blood sugar at least twice a day ONETOUCH ULTRA TEST  STRP (GLUCOSE BLOOD) use as directed CRESTOR 20 MG TABS (ROSUVASTATIN CALCIUM) 1 by mouth daily DONATUSSIN 02-01-14-100 MG/5ML SYRP (PE-CPM-DM-GG) Take 10ml by mouth every 6 hours as needed for cough PEPCID 20 MG TABS (FAMOTIDINE) Take 1 tablet by mouth two times a day SB INSULIN SYRINGE 31G X 5/16" 1 ML MISC (INSULIN SYRINGE-NEEDLE U-100) use to inject lantus insulin one time a day- patient needs shortest  needles- 8mm or smaller PROMETHAZINE HCL 12.5 MG TABS (PROMETHAZINE HCL) Take 1 tablet by mouth every 4-6 hours as needed for nausea/vomiting HYDROCHLOROTHIAZIDE 25 MG TABS (HYDROCHLOROTHIAZIDE) Take one (1) by mouth daily TRAMADOL HCL 50 MG TABS (TRAMADOL HCL) Take 1 tablet by mouth three times a day as needed  Other patient instructions:  Please call up clinic on Tuesday to find out your appointment time. Please note that your lantus has been decreased to 30 units daily. Please continue to rehydrate yourself for next few days untill your diarrhea is completely resolved.  Note: Hospital Discharge Medications & Other Instructions handout was printed, one copy for patient and a second copy to be placed in hospital chart.

## 2010-06-04 NOTE — Progress Notes (Signed)
Summary: refill/gg  Phone Note Refill Request  on January 29, 2010 2:22 PM  Refills Requested: Medication #1:  COREG 25 MG TABS Take 1 tablet by mouth two times a day   Last Refilled: 11/22/2008 Please note the last refill date.   Hospital d/c 01/05/10   Method Requested: Electronic Initial call taken by: Merrie Roof RN,  January 29, 2010 2:22 PM  Follow-up for Phone Call        Rx denied because this was refilled recently. Follow-up by: Jackson Latino MD,  February 01, 2010 7:50 PM

## 2010-06-04 NOTE — Assessment & Plan Note (Signed)
Summary: EST-ROUTINE F/U VISIT PER [facility]   Vital Signs:  Patient profile:   77 year old female Height:      61 inches (154.94 cm) Weight:      107.1 pounds (48.68 kg) BMI:     20.31 Temp:     98.6 degrees F oral Pulse rate:   71 / minute BP sitting:   151 / 87  (left arm) Cuff size:   regular  Vitals Entered By: Cynda Familia Duncan Dull) (March 11, 2010 9:08 AM) CC: pt c/o diarrhea all night last night, couldnt sleep Is Patient Diabetic? Yes Did you bring your meter with you today? Yes Pain Assessment Patient in pain? yes     Location: left knee Intensity: unable to rat Type: sharp Onset of pain  s/p fracture 1 mth ago Nutritional Status BMI of 19 -24 = normal CBG Result 117  Have you ever been in a relationship where you felt threatened, hurt or afraid?Unable to ask   Does patient need assistance? Functional Status Self care Ambulation Normal   Diabetic Foot Exam Foot Inspection Is there a history of a foot ulcer?              No Is there a foot ulcer now?              No Is there swelling or an abnormal foot shape?          No Are the toenails long?                No Are the toenails thick?                No Are the toenails ingrown?              No Is there heavy callous build-up?              No Is there pain in the calf muscle (Intermittent claudication) when walking?    NoIs there a claw toe deformity?              No Is there elevated skin temperature?            No Is there limited ankle dorsiflexion?            No Is there foot or ankle muscle weakness?            No  Diabetic Foot Care Education Patient educated on appropriate care of diabetic feet.  Pulse Check          Right Foot          Left Foot Posterior Tibial:        normal            normal Dorsalis Pedis:        normal            normal  High Risk Feet? No   10-g (5.07) Semmes-Weinstein Monofilament Test Performed by: Lynn Ito          Right Foot          Left Foot Site 1          normal         normal Site 2         normal         normal Site 3         normal         normal Site 4  normal         normal Site 5         normal         normal Site 6         normal         normal  Impression      normal         normal   Primary Care Provider:  Jackson Latino MD  CC:  pt c/o diarrhea all night last night and couldnt sleep.  History of Present Illness: Patient is a 77 yr old woman with PMH of poorly controlled DM, HTN, and CAD  comes to the clinic for diarrhea. She said that last night she had diarrhea after drinking milk, watery, every 2-3 hours, no blood in her stool. This happened several times before after drinking milk. No abdominal pain, fever or muscle pain. This morning feels much better, no diarrhea so far. Good appetite. She also complaints of her left knee pain for several months and wants to get pain med. Patient reports that she CBGs were usually 80-120. No CP, dizziness, SOB, or other c/o.   Problems Prior to Update: 1)  Carotid Bruit  (ICD-785.9) 2)  Renal Insufficiency  (ICD-588.9) 3)  Uti  (ICD-599.0) 4)  Abnormal Sputum  (ICD-786.4) 5)  Preventive Health Care  (ICD-V70.0) 6)  Positive Ppd  (ICD-795.5) 7)  Cad  (ICD-414.00) 8)  Cough, Chronic  (ICD-786.2) 9)  Chest Pain  (ICD-786.50) 10)  Dysplasia, Cervix Nos  (ICD-622.10) 11)  Hysterectomy, Hx of  (ICD-V45.77) 12)  Hypertension  (ICD-401.9) 13)  Hyperlipidemia  (ICD-272.4) 14)  Hepatitis B, Hx of  (ICD-V12.09) 15)  Gerd  (ICD-530.81) 16)  Diabetes Mellitus, Type II  (ICD-250.00)  Medications Prior to Update: 1)  Metformin Hcl 500 Mg Tabs (Metformin Hcl) .... Take 2 Tablets By Mouth Two Times A Day 2)  Multivitamins  Tabs (Multiple Vitamin) .... Take 1 Tablet By Mouth Once A Day 3)  Lantus 100 Unit/ml Soln (Insulin Glargine) .... Inject 55 Units Subcutaneously At Bedtime 4)  Cozaar 100 Mg Tabs (Losartan Potassium) .... Take 1 Tablet By Mouth Daily 5)  Coreg 25 Mg Tabs  (Carvedilol) .... Take 1 Tablet By Mouth Two Times A Day 6)  Plavix 75 Mg Tabs (Clopidogrel Bisulfate) .... Take 1 Tablet By Mouth Once A Day 7)  Aspir-Low 81 Mg Tbec (Aspirin) .... Take 1 Tablet By Mouth Once A Day 8)  Nitroglycerin 0.4 Mg  Subl (Nitroglycerin) .... Take 1 Tablet Under Tongue Every 5 Min Foe A Maximum of 3 Doses 9)  Onetouch Ultrasoft Lancets  Misc (Lancets) .... Use As Directed To Check Blood Sugar At Least Twice A Day 10)  Onetouch Ultra Test  Strp (Glucose Blood) .... Use As Directed 11)  Crestor 20 Mg Tabs (Rosuvastatin Calcium) .Marland Kitchen.. 1 By Mouth Daily 12)  Donatussin 02-01-14-100 Mg/77ml Syrp (Pe-Cpm-Dm-Gg) .... Take 10ml By Mouth Every 6 Hours As Needed For Cough 13)  Pepcid 20 Mg Tabs (Famotidine) .... Take 1 Tablet By Mouth Two Times A Day 14)  Sb Insulin Syringe 31g X 5/16" 1 Ml Misc (Insulin Syringe-Needle U-100) .... Use To Inject Lantus Insulin One Time A Day- Patient Needs Shortest  Needles- 8mm or Smaller 15)  Promethazine Hcl 12.5 Mg Tabs (Promethazine Hcl) .... Take 1 Tablet By Mouth Every 4-6 Hours As Needed For Nausea/vomiting 16)  Augmentin 500-125 Mg Tabs (Amoxicillin-Pot Clavulanate) .... Take 1 Tablet By Mouth Twice A Day For 4 Days  Current Medications (verified): 1)  Metformin Hcl 500 Mg Tabs (Metformin Hcl) .... Take 2 Tablets By Mouth Two Times A Day 2)  Multivitamins  Tabs (Multiple Vitamin) .... Take 1 Tablet By Mouth Once A Day 3)  Lantus 100 Unit/ml Soln (Insulin Glargine) .... Inject 55 Units Subcutaneously At Bedtime 4)  Cozaar 100 Mg Tabs (Losartan Potassium) .... Take 1 Tablet By Mouth Daily 5)  Coreg 25 Mg Tabs (Carvedilol) .... Take 1 Tablet By Mouth Two Times A Day 6)  Plavix 75 Mg Tabs (Clopidogrel Bisulfate) .... Take 1 Tablet By Mouth Once A Day 7)  Aspir-Low 81 Mg Tbec (Aspirin) .... Take 1 Tablet By Mouth Once A Day 8)  Nitroglycerin 0.4 Mg  Subl (Nitroglycerin) .... Take 1 Tablet Under Tongue Every 5 Min Foe A Maximum of 3 Doses 9)   Onetouch Ultrasoft Lancets  Misc (Lancets) .... Use As Directed To Check Blood Sugar At Least Twice A Day 10)  Onetouch Ultra Test  Strp (Glucose Blood) .... Use As Directed 11)  Crestor 20 Mg Tabs (Rosuvastatin Calcium) .Marland Kitchen.. 1 By Mouth Daily 12)  Donatussin 02-01-14-100 Mg/32ml Syrp (Pe-Cpm-Dm-Gg) .... Take 10ml By Mouth Every 6 Hours As Needed For Cough 13)  Pepcid 20 Mg Tabs (Famotidine) .... Take 1 Tablet By Mouth Two Times A Day 14)  Sb Insulin Syringe 31g X 5/16" 1 Ml Misc (Insulin Syringe-Needle U-100) .... Use To Inject Lantus Insulin One Time A Day- Patient Needs Shortest  Needles- 8mm or Smaller 15)  Promethazine Hcl 12.5 Mg Tabs (Promethazine Hcl) .... Take 1 Tablet By Mouth Every 4-6 Hours As Needed For Nausea/vomiting  Allergies (verified): No Known Drug Allergies  Past History:  Past Medical History: Last updated: 10/28/2008 Diabetes mellitus, type II GERD Hepatitis B Hyperlipidemia Hypertension Cervical dysplasia Atrophic vaginitis Positive PPD in 2008 (Normal CXR) CAD (April 2008 with LIMA to the LAD, SVG to OM, SVG to distal right coronary artery and PDA.  She had repeat cath along with this demonstrating 99% stenosis at the anastomosis of the graft to the PDA.  She was managed medically), Spinal stenosis s/p decompression  Past Surgical History: Last updated: 12/29/2009 Back surgery (decompression L3-S1) Hysterectomy (cervical dysplasia) CABG - April 2008  Family History: Last updated: 10/28/2008  Noncontributory.  Social History: Last updated: 12/29/2009 She is a [place] immigrant She has been a housewife.   She is widowed and lives w/ her son. Tobacco - none Alcohol Use - no Illicit Drug Use - no  Risk Factors: Smoking Status: never (12/29/2009)  Family History: Reviewed history from 10/28/2008 and no changes required.  Noncontributory.  Social History: Reviewed history from 12/29/2009 and no changes required. She is a [place] immigrant She  has been a housewife.   She is widowed and lives w/ her son. Tobacco - none Alcohol Use - no Illicit Drug Use - no  Review of Systems  The patient denies fever, chest pain, syncope, dyspnea on exertion, peripheral edema, prolonged cough, headaches, hemoptysis, abdominal pain, and melena.    Physical Exam  General:  alert, well-developed, well-nourished, and well-hydrated.   Nose:  no nasal discharge.   Mouth:  pharynx pink and moist.   Neck:  supple.   Lungs:  normal respiratory effort, no intercostal retractions, no crackles, and no wheezes.   Heart:  normal rate, regular rhythm, no murmur, and no JVD.   Abdomen:  soft, non-tender, normal bowel sounds, no distention, and no masses.   Msk:  normal ROM, no joint swelling,  no joint warmth, and no redness over joints.  Left knee tenderness.  Pulses:  2+ Extremities:  No edema. Neurologic:  alert & oriented X3, cranial nerves II-XII intact, strength normal in all extremities, sensation intact to light touch, gait normal, and DTRs symmetrical and normal.    Diabetes Management Exam:    Foot Exam (with socks and/or shoes not present):       Sensory-Monofilament:          Left foot: normal          Right foot: normal   Impression & Recommendations:  Problem # 1:  DIARRHEA, FUNCTIONAL (ICD-564.5) Assessment Improved Her diarrhea is likely from intolerance to milk. Advised her to try soy milk or something else. Since her diarrha is better, no dehydration, no need for further management. Asked her to come if diarhhrea becomes worse.   Problem # 2:  DIABETES MELLITUS, TYPE II (ICD-250.00) Assessment: Improved Her A1C significantly improved, will continue current regimen. Will do foot exam and have eye referral. Her updated medication list for this problem includes:    Metformin Hcl 500 Mg Tabs (Metformin hcl) .Marland Kitchen... Take 2 tablets by mouth two times a day    Lantus 100 Unit/ml Soln (Insulin glargine) ..... Inject 55 units subcutaneously  at bedtime    Cozaar 100 Mg Tabs (Losartan potassium) .Marland Kitchen... Take 1 tablet by mouth daily    Aspir-low 81 Mg Tbec (Aspirin) .Marland Kitchen... Take 1 tablet by mouth once a day  Orders: T-Hgb A1C (in-house) (86578IO) T- Capillary Blood Glucose (96295) Ophthalmology Referral (Ophthalmology)  Labs Reviewed: Creat: 1.09 (12/29/2009)   Microalbumin: 9.94 (11/25/2005) Reviewed HgBA1c results: 7.3 (03/11/2010)  10.6 (12/29/2009)  Problem # 3:  HYPERTENSION (ICD-401.9) Assessment: Unchanged Her BP still not well controlled, will add HCTZ and recheck at next visit.  Her updated medication list for this problem includes:    Cozaar 100 Mg Tabs (Losartan potassium) .Marland Kitchen... Take 1 tablet by mouth daily    Coreg 25 Mg Tabs (Carvedilol) .Marland Kitchen... Take 1 tablet by mouth two times a day    Hydrochlorothiazide 25 Mg Tabs (Hydrochlorothiazide) .Marland Kitchen... Take one (1) by mouth daily  BP today: 151/87 Prior BP: 148/78 (12/31/2009)  Labs Reviewed: K+: 4.2 (12/29/2009) Creat: : 1.09 (12/29/2009)   Chol: 161 (02/26/2009)   HDL: 47 (02/26/2009)   LDL: 69 (02/26/2009)   TG: 226 (02/26/2009)  Problem # 4:  OSTEOARTHRITIS (ICD-715.90) Assessment: New Her left knee pain is likely due to OA. Will give tramadol for pain control. Advised her to exercise.  Her updated medication list for this problem includes:    Aspir-low 81 Mg Tbec (Aspirin) .Marland Kitchen... Take 1 tablet by mouth once a day    Tramadol Hcl 50 Mg Tabs (Tramadol hcl) .Marland Kitchen... Take 1 tablet by mouth three times a day as needed  Discussed use of medications, application of heat or cold, and exercises.   Complete Medication List: 1)  Metformin Hcl 500 Mg Tabs (Metformin hcl) .... Take 2 tablets by mouth two times a day 2)  Multivitamins Tabs (Multiple vitamin) .... Take 1 tablet by mouth once a day 3)  Lantus 100 Unit/ml Soln (Insulin glargine) .... Inject 55 units subcutaneously at bedtime 4)  Cozaar 100 Mg Tabs (Losartan potassium) .... Take 1 tablet by mouth daily 5)  Coreg  25 Mg Tabs (Carvedilol) .... Take 1 tablet by mouth two times a day 6)  Plavix 75 Mg Tabs (Clopidogrel bisulfate) .... Take 1 tablet by mouth once a day 7)  Aspir-low 81 Mg Tbec (Aspirin) .... Take 1 tablet by mouth once a day 8)  Nitroglycerin 0.4 Mg Subl (Nitroglycerin) .... Take 1 tablet under tongue every 5 min foe a maximum of 3 doses 9)  Onetouch Ultrasoft Lancets Misc (Lancets) .... Use as directed to check blood sugar at least twice a day 10)  Onetouch Ultra Test Strp (Glucose blood) .... Use as directed 11)  Crestor 20 Mg Tabs (Rosuvastatin calcium) .Marland Kitchen.. 1 by mouth daily 12)  Donatussin 02-01-14-100 Mg/65ml Syrp (Pe-cpm-dm-gg) .... Take 10ml by mouth every 6 hours as needed for cough 13)  Pepcid 20 Mg Tabs (Famotidine) .... Take 1 tablet by mouth two times a day 14)  Sb Insulin Syringe 31g X 5/16" 1 Ml Misc (Insulin syringe-needle u-100) .... Use to inject lantus insulin one time a day- patient needs shortest  needles- 8mm or smaller 15)  Promethazine Hcl 12.5 Mg Tabs (Promethazine hcl) .... Take 1 tablet by mouth every 4-6 hours as needed for nausea/vomiting 16)  Hydrochlorothiazide 25 Mg Tabs (Hydrochlorothiazide) .... Take one (1) by mouth daily 17)  Tramadol Hcl 50 Mg Tabs (Tramadol hcl) .... Take 1 tablet by mouth three times a day as needed  Patient Instructions: 1)  Please schedule a follow-up appointment in 2-3 months. 2)  Check your blood sugars regularly. If your readings are usually above : or below 70 you should contact our office. 3)  See your eye doctor yearly to check for diabetic eye damage. 4)  Check your feet each night for sore areas, calluses or signs of infection. Prescriptions: TRAMADOL HCL 50 MG TABS (TRAMADOL HCL) Take 1 tablet by mouth three times a day as needed  #60 x 2   Entered and Authorized by:   Jackson Latino MD   Signed by:   Jackson Latino MD on 03/11/2010   Method used:   Print then Give to Patient   RxID:   [id number] HYDROCHLOROTHIAZIDE 25  MG TABS (HYDROCHLOROTHIAZIDE) Take one (1) by mouth daily  #30 x 6   Entered and Authorized by:   Jackson Latino MD   Signed by:   Jackson Latino MD on 03/11/2010   Method used:   Reprint   RxID:   [id number] HYDROCHLOROTHIAZIDE 25 MG TABS (HYDROCHLOROTHIAZIDE) Take one (1) by mouth daily  #30 x 6   Entered and Authorized by:   Jackson Latino MD   Signed by:   Jackson Latino MD on 03/11/2010   Method used:   Print then Give to Patient   RxID:   [id number]    Orders Added: 1)  T-Hgb A1C (in-house) [01027OZ] 2)  T- Capillary Blood Glucose [82948] 3)  Ophthalmology Referral [Ophthalmology] 4)  Est. Patient Level IV [36644]    Prevention & Chronic Care Immunizations   Influenza vaccine: Historical  (02/23/2006)   Influenza vaccine deferral: Not indicated  (02/26/2009)    Tetanus booster: Not documented    Pneumococcal vaccine: Not documented   Pneumococcal vaccine deferral: Refused  (03/11/2010)    H. zoster vaccine: Not documented  Colorectal Screening   Hemoccult: Not documented   Hemoccult action/deferral: Refused  (07/11/2009)    Colonoscopy: Not documented   Colonoscopy action/deferral: Refused  (02/26/2009)  Other Screening   Pap smear: Not documented   Pap smear action/deferral: Not indicated S/P hysterectomy  (07/11/2009)    Mammogram: Not documented   Mammogram action/deferral: Refused  (02/26/2009)    DXA bone density scan: Not documented   Smoking status: never  (12/29/2009)  Diabetes Mellitus  HgbA1C: 7.3  (03/11/2010)    Eye exam: Not documented   Diabetic eye exam action/deferral: Ophthalmology referral  (03/11/2010)    Foot exam: yes  (03/11/2010)   Foot exam action/deferral: Do today   High risk foot: No  (03/11/2010)   Foot care education: Done  (03/11/2010)   Foot exam due: 12/14/2006    Urine microalbumin/creatinine ratio: 339.7  (12/29/2009)   Urine microalbumin action/deferral: Ordered   Urine microalbumin/cr due:  11/25/2006    Diabetes flowsheet reviewed?: Yes   Progress toward A1C goal: Improved  Lipids   Total Cholesterol: 161  (02/26/2009)   Lipid panel action/deferral: Lipid Panel ordered   LDL: 69  (02/26/2009)   LDL Direct: Not documented   HDL: 47  (02/26/2009)   Triglycerides: 226  (02/26/2009)   Lipid panel due: 02/24/2007    SGOT (AST): 24  (12/29/2009)   SGPT (ALT): 26  (12/29/2009)   Alkaline phosphatase: 78  (12/29/2009)   Total bilirubin: 0.9  (12/29/2009)    Lipid flowsheet reviewed?: Yes   Progress toward LDL goal: At goal  Hypertension   Last Blood Pressure: 151 / 87  (03/11/2010)   Serum creatinine: 1.09  (12/29/2009)   BMP action: Ordered   Serum potassium 4.2  (12/29/2009)    Hypertension flowsheet reviewed?: Yes   Progress toward BP goal: Deteriorated  Self-Management Support :   Personal Goals (by the next clinic visit) :     Personal A1C goal: 7  (02/26/2009)     Personal blood pressure goal: 130/80  (02/26/2009)     Personal LDL goal: 100  (02/26/2009)    Patient will work on the following items until the next clinic visit to reach self-care goals:     Medications and monitoring: take my medicines every day  (03/11/2010)     Eating: drink diet soda or water instead of juice or soda, eat more vegetables, use fresh or frozen vegetables, eat foods that are low in salt, eat baked foods instead of fried foods, eat fruit for snacks and desserts, limit or avoid alcohol  (12/29/2009)     Activity: take a 30 minute walk every day  (12/29/2009)    Diabetes self-management support: Copy of home glucose meter record, Written self-care plan  (03/11/2010)   Diabetes care plan printed   Last diabetes self-management training by diabetes educator: 08/01/2009   Last medical nutrition therapy: 05/21/2008    Hypertension self-management support: Written self-care plan  (03/11/2010)   Hypertension self-care plan printed.    Lipid self-management support: Written self-care  plan  (03/11/2010)   Lipid self-care plan printed.   Nursing Instructions: Give Flu vaccine today Diabetic foot exam today Refer for screening diabetic eye exam (see order)    Laboratory Results   Blood Tests   Date/Time Received: March 11, 2010 9:17 AM Date/Time Reported: Alric Quan  March 11, 2010 9:17 AM   HGBA1C: 7.3%   (Normal Range: Non-Diabetic - 3-6%   Control Diabetic - 6-8%) CBG Random:: 117mg /dL

## 2010-06-04 NOTE — Progress Notes (Signed)
Summary: pharmacy/gg  Phone Note From Pharmacy   Summary of Call: Pharmacy called for clarification on metformin Rx. Do you want metformin 500 mg 2 tabs three times a day ??  If so will yo change to # 124 instead of # 62 Initial call taken by: Merrie Roof RN,  July 23, 2009 11:16 AM  Follow-up for Phone Call        she should take metformin 500mg /tab, 2 tablets, by mouth two times a day  Follow-up by: Jackson Latino MD,  July 24, 2009 9:31 AM    Prescriptions: METFORMIN HCL 500 MG TABS (METFORMIN HCL) Take 2 tablets by mouth two times a day  #124 x 12   Entered and Authorized by:   Jackson Latino MD   Signed by:   Jackson Latino MD on 07/24/2009   Method used:   F[facility]ed to ...       [facility]       [address]       [place], [place]  [address]       Ph: [phone]       Fax: [phone]   RxID:   [id number]   Appended Document: pharmacy/gg faxed in

## 2010-06-04 NOTE — Progress Notes (Signed)
Summary: refill/ hla  Phone Note Refill Request Message from:  Pharmacy on July 03, 2009 5:30 PM  Refills Requested: Medication #1:  COZAAR 50 MG TABS Take 1 tablet by mouth once a day   Dosage confirmed as above?Dosage Confirmed   Last Refilled: 2/1 last visit 2/23 and labs 10/10  Initial call taken by: Marin Roberts RN,  July 03, 2009 5:30 PM    Prescriptions: COZAAR 50 MG TABS (LOSARTAN POTASSIUM) Take 1 tablet by mouth once a day  #30 x 11   Entered and Authorized by:   Jackson Latino MD   Signed by:   Jackson Latino MD on 07/07/2009   Method used:   [facility] to        [facility] [phone]* (retail)       [address]       [place], [place]  [address]       Ph: [phone] or [phone]       Fax: [phone]   RxID:   [id number]

## 2010-06-04 NOTE — Discharge Summary (Signed)
Summary: Hospital Discharge Update    Hospital Discharge Update:  Date of Admission: 01/03/2010 Date of Discharge: 01/05/2010  Brief Summary:  This is a 77 year old female who presented with nausea/vomiting, urinary retention, and productive cough in the setting of recent antibiotic treatment for UTI with both levofloxacin and augmentin.  During her hospitalization the patient was placed on primaxin initially which was changed over to Unasyn.  The patient received a total of 3 days or appropriate antibiotics.  By the time of discharge the patient was tolerating a full diet well and was no longer having urinary complaints.   Lab or other results pending at discharge:  Urine Culture, Cdiff toxin assay  Labs needed at follow-up: CBC with differential, Basic metabolic panel  Other follow-up issues:  Pt was anemic on admission, this improved by discharge.  Medication list changes:  Removed medication of LEVAQUIN 500 MG TABS (LEVOFLOXACIN) Take 1 tablet by mouth daily for 10 days Changed medication from AUGMENTIN 500-125 MG TABS (AMOXICILLIN-POT CLAVULANATE) Take 1 tablet by mouth twice a day for 7 days to AUGMENTIN 500-125 MG TABS (AMOXICILLIN-POT CLAVULANATE) Take 1 tablet by mouth twice a day for 4 days  The medication, problem, and allergy lists have been updated.  Please see the dictated discharge summary for details.  Discharge medications:  METFORMIN HCL 500 MG TABS (METFORMIN HCL) Take 2 tablets by mouth two times a day MULTIVITAMINS  TABS (MULTIPLE VITAMIN) Take 1 tablet by mouth once a day LANTUS 100 UNIT/ML SOLN (INSULIN GLARGINE) Inject 55 units subcutaneously at bedtime COZAAR 100 MG TABS (LOSARTAN POTASSIUM) Take 1 tablet by mouth daily COREG 25 MG TABS (CARVEDILOL) Take 1 tablet by mouth two times a day PLAVIX 75 MG TABS (CLOPIDOGREL BISULFATE) Take 1 tablet by mouth once a day ASPIR-LOW 81 MG TBEC (ASPIRIN) Take 1 tablet by mouth once a day NITROGLYCERIN 0.4 MG  SUBL  (NITROGLYCERIN) take 1 tablet under tongue every 5 min foe a maximum of 3 doses ONETOUCH ULTRASOFT LANCETS  MISC (LANCETS) Use as directed to check blood sugar at least twice a day ONETOUCH ULTRA TEST  STRP (GLUCOSE BLOOD) use as directed CRESTOR 20 MG TABS (ROSUVASTATIN CALCIUM) 1 by mouth daily DONATUSSIN 02-01-14-100 MG/5ML SYRP (PE-CPM-DM-GG) Take 10ml by mouth every 6 hours as needed for cough PEPCID 20 MG TABS (FAMOTIDINE) Take 1 tablet by mouth two times a day SB INSULIN SYRINGE 31G X 5/16" 1 ML MISC (INSULIN SYRINGE-NEEDLE U-100) use to inject lantus insulin one time a day- patient needs shortest  needles- 8mm or smaller PROMETHAZINE HCL 12.5 MG TABS (PROMETHAZINE HCL) Take 1 tablet by mouth every 4-6 hours as needed for nausea/vomiting AUGMENTIN 500-125 MG TABS (AMOXICILLIN-POT CLAVULANATE) Take 1 tablet by mouth twice a day for 4 days  Other patient instructions:  You will be contacted with your appointment date at the [facility].  Please come to this appointment. Please call [phone] if you are not contacted within one week.   Please take your medication as prescribed below. You will take your antibiotic (Augmentin) twice a day for four more days.     If you have any problem, Please call the clinic.     In case of an emergency dial 911 or go to the emergency department.   Note: Hospital Discharge Medications & Other Instructions handout was printed, one copy for patient and a second copy to be placed in hospital chart.

## 2010-06-04 NOTE — Progress Notes (Signed)
Summary: refill/gg  Phone Note Refill Request  on July 10, 2009 3:43 PM  Refills Requested: Medication #1:  PLAVIX 75 MG TABS Take 1 tablet by mouth once a day   Dosage confirmed as above?Dosage Confirmed  Method Requested: Electronic Initial call taken by: Merrie Roof RN,  July 10, 2009 3:43 PM    Prescriptions: PLAVIX 75 MG TABS (CLOPIDOGREL BISULFATE) Take 1 tablet by mouth once a day  #31 Tablet x 12   Entered and Authorized by:   Jackson Latino MD   Signed by:   Jackson Latino MD on 07/10/2009   Method used:   [facility]        [facility] [phone]* (retail)       [address]       [place], [place]  [address]       Ph: [phone] or [phone]       Fax: [phone]   RxID:   [id number]

## 2010-06-04 NOTE — Letter (Signed)
Summary: ONE TOUCH/02-06-2010-03-07-2010  ONE TOUCH/02-06-2010-03-07-2010   Imported By: Margie Billet 03/16/2010 15:42:16  _____________________________________________________________________  External Attachment:    Type:   Image     Comment:   External Document

## 2010-06-04 NOTE — Progress Notes (Signed)
  Phone Note Outgoing Call   Call placed by: Danelle Berry, MD,  January 01, 2010 3:01 PM Call placed to: Patient Summary of Call: Patient phoned to have results of CXR explained.  Pt's daughter explained that patient is nauseous with taking levaquin - appearantly this has happened in the past.  Follow-up for Phone Call        phoned patient back and spoke with daughter - phenergan by mouth Rx called in to pharmacy.  Accidentally called in suppositories...called pharmacy and corrected to tablets. Follow-up by: Danelle Berry, MD,  January 01, 2010 3:06 PM    New/Updated Medications: PROMETHAZINE HCL 12.5 MG SUPP (PROMETHAZINE HCL) Take 1 tablet by mouth every 4-6 hours as needed for nausea/vomiting. PROMETHAZINE HCL 12.5 MG TABS (PROMETHAZINE HCL) Take 1 tablet by mouth every 4-6 hours as needed for nausea/vomiting Prescriptions: PROMETHAZINE HCL 12.5 MG TABS (PROMETHAZINE HCL) Take 1 tablet by mouth every 4-6 hours as needed for nausea/vomiting  #30 x 0   Entered and Authorized by:   Danelle Berry, MD   Signed by:   Danelle Berry, MD on 01/01/2010   Method used:   Electronically to        [facility] [phone]* (retail)       [address], [place]  [address]       Ph: [phone] or [phone]       Fax: [phone]   RxID:   [id number] PROMETHAZINE HCL 12.5 MG SUPP (PROMETHAZINE HCL) Take 1 tablet by mouth every 4-6 hours as needed for nausea/vomiting.  #30 x 0   Entered and Authorized by:   Danelle Berry, MD   Signed by:   Danelle Berry, MD on 01/01/2010   Method used:   [facility] to        [facility] [phone]* (retail)       [address]       [place], [place]  [address]       Ph: [phone] or [phone]       Fax: [phone]   RxID:   [id number]

## 2010-06-04 NOTE — Assessment & Plan Note (Signed)
Summary: tired, heartburn, cough and runny nose 1- 2 wks/pcp-yang/hla   Vital Signs:  Patient profile:   77 year old female Height:      61 inches (154.94 cm) Weight:      109.0 pounds (49.55 kg) BMI:     20.67 Temp:     97.9 degrees F (36.61 degrees C) oral Pulse rate:   70 / minute BP sitting:   159 / 89  (right arm)  Vitals Entered By: Filomena Jungling NT II (June 25, 2009 9:29 AM) CC: cough, itching throat, runny nose x 2 weeks Is Patient Diabetic? Yes Did you bring your meter with you today? No Pain Assessment Patient in pain? no      Nutritional Status BMI of 19 -24 = normal  Does patient need assistance? Functional Status Self care Ambulation Normal   Primary Care Provider:  Jackson Latino MD  CC:  cough, itching throat, and runny nose x 2 weeks.  History of Present Illness: 77 yr old woman with pmhx as described below comes to the clinic for follow up. Patient reports that she has not checked her sugars for more than 2 months because she did not have any strips for her meter. [facility] were too expensive so she didn't buy them.   Patient reports to feeling sleepy alot. Associated with increase thirst, tremors, weakness. She is only taking metformin once a day.   Complains of abdominal fullness, burping, and alot of gas. When she burp its sour.    Preventive Screening-Counseling & Management  Alcohol-Tobacco     Smoking Status: never  Problems Prior to Update: 1)  Headache  (ICD-784.0) 2)  Preventive Health Care  (ICD-V70.0) 3)  Rlq Pain  (ICD-789.03) 4)  Urinary Frequency  (ICD-788.41) 5)  Pneumonia, Left Lower Lobe  (ICD-481) 6)  Closed Fracture of Unspecified Part of Humerus  (ICD-812.20) 7)  Sprain/strain, Back Nos  (ICD-847.9) 8)  Positive Ppd  (ICD-795.5) 9)  Cellulitis  (ICD-682.9) 10)  Cad  (ICD-414.00) 11)  Dental Pain  (ICD-525.9) 12)  Cough, Chronic  (ICD-786.2) 13)  Chest Pain  (ICD-786.50) 14)   Dysplasia, Cervix Nos  (ICD-622.10) 15)  Hysterectomy, Hx of  (ICD-V45.77) 16)  Hypertension  (ICD-401.9) 17)  Hyperlipidemia  (ICD-272.4) 18)  Hepatitis B, Hx of  (ICD-V12.09) 19)  Gerd  (ICD-530.81) 20)  Diabetes Mellitus, Type II  (ICD-250.00)  Medications Prior to Update: 1)  Metformin Hcl 500 Mg Tabs (Metformin Hcl) .... Take 1 Tablet By Mouth Two Times A Day 2)  Multivitamins  Tabs (Multiple Vitamin) .... Take 1 Tablet By Mouth Once A Day 3)  Lantus 100 Unit/ml Soln (Insulin Glargine) .... Inject 55 Units Subcutaneously At Bedtime 4)  Cozaar 50 Mg Tabs (Losartan Potassium) .... Take 1 Tablet By Mouth Once A Day 5)  Coreg 25 Mg Tabs (Carvedilol) .... Take 1 Tablet By Mouth Two Times A Day 6)  Plavix 75 Mg Tabs (Clopidogrel Bisulfate) .... Take 1 Tablet By Mouth Once A Day 7)  Aspir-Low 81 Mg Tbec (Aspirin) .... Take 1 Tablet By Mouth Once A Day 8)  Nitroglycerin 0.4 Mg  Subl (Nitroglycerin) .... Take 1 Tablet Under Tongue Every 5 Min Foe A Maximum of 3 Doses 9)  Onetouch Ultrasoft Lancets  Misc (Lancets) .... Use As Directed To Check Blood Sugar At Least Twice A Day 10)  Onetouch Ultra Test  Strp (Glucose Blood) .... Use As Directed 11)  Crestor 20  Mg Tabs (Rosuvastatin Calcium) .Marland Kitchen.. 1 By Mouth Daily  Current Medications (verified): 1)  Metformin Hcl 500 Mg Tabs (Metformin Hcl) .... Take 1 Tablet By Mouth Two Times A Day 2)  Multivitamins  Tabs (Multiple Vitamin) .... Take 1 Tablet By Mouth Once A Day 3)  Lantus 100 Unit/ml Soln (Insulin Glargine) .... Inject 55 Units Subcutaneously At Bedtime 4)  Cozaar 50 Mg Tabs (Losartan Potassium) .... Take 1 Tablet By Mouth Once A Day 5)  Coreg 25 Mg Tabs (Carvedilol) .... Take 1 Tablet By Mouth Two Times A Day 6)  Plavix 75 Mg Tabs (Clopidogrel Bisulfate) .... Take 1 Tablet By Mouth Once A Day 7)  Aspir-Low 81 Mg Tbec (Aspirin) .... Take 1 Tablet By Mouth Once A Day 8)  Nitroglycerin 0.4 Mg  Subl (Nitroglycerin) .... Take 1 Tablet Under  Tongue Every 5 Min Foe A Maximum of 3 Doses 9)  Onetouch Ultrasoft Lancets  Misc (Lancets) .... Use As Directed To Check Blood Sugar At Least Twice A Day 10)  Onetouch Ultra Test  Strp (Glucose Blood) .... Use As Directed 11)  Crestor 20 Mg Tabs (Rosuvastatin Calcium) .Marland Kitchen.. 1 By Mouth Daily  Allergies: No Known Drug Allergies  Past History:  Past Medical History: Last updated: 10/28/2008 Diabetes mellitus, type II GERD Hepatitis B Hyperlipidemia Hypertension Cervical dysplasia Atrophic vaginitis Positive PPD in 2008 (Normal CXR) CAD (April 2008 with LIMA to the LAD, SVG to OM, SVG to distal right coronary artery and PDA.  She had repeat cath along with this demonstrating 99% stenosis at the anastomosis of the graft to the PDA.  She was managed medically), Spinal stenosis s/p decompression  Past Surgical History: Last updated: 10/28/2008 Back surgery (decompression L3-S1) Hysterectomy (cervical dysplasia) CABG  Family History: Last updated: 10/28/2008  Noncontributory.  Social History: Last updated: 10/28/2008 She is a [place] immigrant She has been a housewife.   She is widowed and lives w/ her son. Alcohol Use - no Illicit Drug Use - no  Risk Factors: Smoking Status: never (06/25/2009)  Family History: Reviewed history from 10/28/2008 and no changes required.  Noncontributory.  Social History: Reviewed history from 10/28/2008 and no changes required. She is a [place] immigrant She has been a housewife.   She is widowed and lives w/ her son. Alcohol Use - no Illicit Drug Use - no  Review of Systems  The patient denies fever, chest pain, dyspnea on exertion, peripheral edema, hemoptysis, abdominal pain, melena, hematochezia, hematuria, muscle weakness, and difficulty walking.    Physical Exam  General:  alert, well-developed, well-nourished, and well-hydrated.   Mouth:  erythema pharynx, no exudates Neck:  supple.   Lungs:  normal respiratory effort,  normal breath sounds, no crackles, and no wheezes.   Heart:  normal rate, regular rhythm, no murmur, and no JVD.   Abdomen:  soft, non-tender, normal bowel sounds, no distention, and no masses.   Msk:  normal ROM, no joint tenderness, no joint swelling, no joint warmth, and no redness over joints.   Neurologic:  alert & oriented X3, cranial nerves II-XII intact, strength normal in all extremities, sensation intact to light touch, and gait normal.     Impression & Recommendations:  Problem # 1:  COUGH (ICD-786.2) Likely 2/2 Viral URTI. Will treat symptomatically. Patient was instructed to take docussin as directed.  Problem # 2:  DIABETES MELLITUS, TYPE II (ICD-250.00) Uncontrolled. Patient has not checked sugars since November 2010. Educated on importance of compliance to medication, and diet. Patient is  not taking medication as prescribed. Reports to have symptoms suggestive of hypoglycemia although her HgA1c is 11.4. Will have her start taking medication as prescribed, although would like to increase metformin but will consider after reviewing meter. Instructed patient to check sugars three times a day before meals X2 weeks until next visit. Will reasses at next visit.  Her updated medication list for this problem includes:    Metformin Hcl 500 Mg Tabs (Metformin hcl) .Marland Kitchen... Take 1 tablet by mouth two times a day    Lantus 100 Unit/ml Soln (Insulin glargine) ..... Inject 55 units subcutaneously at bedtime    Cozaar 50 Mg Tabs (Losartan potassium) .Marland Kitchen... Take 1 tablet by mouth once a day    Aspir-low 81 Mg Tbec (Aspirin) .Marland Kitchen... Take 1 tablet by mouth once a day  Orders: T- Capillary Blood Glucose (82948) T-Hgb A1C (in-house) (16109UE)  Problem # 3:  GERD (ICD-530.81) Currently experiencing symptoms suggestive of GERD. Patient had been taking protonix but it was discontinued as may decrease efficacy of clopidogrel. Will start H2 blocker and monitor response to medicaiton.  Her updated  medication list for this problem includes:    Pepcid 20 Mg Tabs (Famotidine) .Marland Kitchen... Take 1 tablet by mouth two times a day  Problem # 4:  HYPERTENSION (ICD-401.9) Improved but still elevated. Will recheck in two weeks and consider increasing Losartan.  Her updated medication list for this problem includes:    Cozaar 50 Mg Tabs (Losartan potassium) .Marland Kitchen... Take 1 tablet by mouth once a day    Coreg 25 Mg Tabs (Carvedilol) .Marland Kitchen... Take 1 tablet by mouth two times a day  BP today: 159/89 Prior BP: 174/83 (06/02/2009)  Labs Reviewed: K+: 4.2 (02/26/2009) Creat: : 1.23 (02/26/2009)   Chol: 161 (02/26/2009)   HDL: 47 (02/26/2009)   LDL: 69 (02/26/2009)   TG: 226 (02/26/2009)  Problem # 5:  CAD (ICD-414.00) Continue per Cards.  Her updated medication list for this problem includes:    Cozaar 50 Mg Tabs (Losartan potassium) .Marland Kitchen... Take 1 tablet by mouth once a day    Coreg 25 Mg Tabs (Carvedilol) .Marland Kitchen... Take 1 tablet by mouth two times a day    Plavix 75 Mg Tabs (Clopidogrel bisulfate) .Marland Kitchen... Take 1 tablet by mouth once a day    Aspir-low 81 Mg Tbec (Aspirin) .Marland Kitchen... Take 1 tablet by mouth once a day    Nitroglycerin 0.4 Mg Subl (Nitroglycerin) .Marland Kitchen... Take 1 tablet under tongue every 5 min foe a maximum of 3 doses  Problem # 6:  HYPERLIPIDEMIA (ICD-272.4) At goal. Continue current regimen. Check liver function test on follow up.  Her updated medication list for this problem includes:    Crestor 20 Mg Tabs (Rosuvastatin calcium) .Marland Kitchen... 1 by mouth daily  Labs Reviewed: SGOT: 15 (05/14/2008)   SGPT: 14 (05/14/2008)   HDL:47 (02/26/2009), 47 (05/14/2008)  LDL:69 (02/26/2009), 104 (45/40/9811)  Chol:161 (02/26/2009), 185 (05/14/2008)  Trig:226 (02/26/2009), 170 (05/14/2008)  Complete Medication List: 1)  Metformin Hcl 500 Mg Tabs (Metformin hcl) .... Take 1 tablet by mouth two times a day 2)  Multivitamins Tabs (Multiple vitamin) .... Take 1 tablet by mouth once a day 3)  Lantus 100 Unit/ml Soln  (Insulin glargine) .... Inject 55 units subcutaneously at bedtime 4)  Cozaar 50 Mg Tabs (Losartan potassium) .... Take 1 tablet by mouth once a day 5)  Coreg 25 Mg Tabs (Carvedilol) .... Take 1 tablet by mouth two times a day 6)  Plavix 75 Mg Tabs (Clopidogrel bisulfate) .Marland KitchenMarland KitchenMarland Kitchen  Take 1 tablet by mouth once a day 7)  Aspir-low 81 Mg Tbec (Aspirin) .... Take 1 tablet by mouth once a day 8)  Nitroglycerin 0.4 Mg Subl (Nitroglycerin) .... Take 1 tablet under tongue every 5 min foe a maximum of 3 doses 9)  Onetouch Ultrasoft Lancets Misc (Lancets) .... Use as directed to check blood sugar at least twice a day 10)  Onetouch Ultra Test Strp (Glucose blood) .... Use as directed 11)  Crestor 20 Mg Tabs (Rosuvastatin calcium) .Marland Kitchen.. 1 by mouth daily 12)  Donatussin 02-01-14-100 Mg/49ml Syrp (Pe-cpm-dm-gg) .... Take 10ml by mouth every 6 hours as needed for cough 13)  Pepcid 20 Mg Tabs (Famotidine) .... Take 1 tablet by mouth two times a day  Patient Instructions: 1)  Please schedule a follow-up appointment in 2 weeks for diabetes and hypertension management. 2)  Check sugars three times a day: once before breakfast, once before lunch, and once before dinner for the next two weeks. 3)  Bring meter to clinic visit. 4)  Start taking Metformin twice a day. 5)  Take Pepcid for bloating, and burping. 6)  Take Donatussin for cough as directed. Prescriptions: PEPCID 20 MG TABS (FAMOTIDINE) Take 1 tablet by mouth two times a day  #60 x 11   Entered and Authorized by:   Laren Everts MD   Signed by:   Laren Everts MD on 06/25/2009   Method used:   Print then Give to Patient   RxID:   [id number] DONATUSSIN 02-01-14-100 MG/5ML SYRP (PE-CPM-DM-GG) Take 10ml by mouth every 6 hours as needed for cough  #426ml x 0   Entered and Authorized by:   Laren Everts MD   Signed by:   Laren Everts MD on 06/25/2009   Method used:   Print then Give to Patient   RxID:    [id number] ONETOUCH ULTRA TEST  STRP (GLUCOSE BLOOD) use as directed  #100 x 11   Entered and Authorized by:   Laren Everts MD   Signed by:   Laren Everts MD on 06/25/2009   Method used:   Print then Give to Patient   RxID:   [id number] ONETOUCH ULTRASOFT LANCETS  MISC (LANCETS) Use as directed to check blood sugar at least twice a day  #1 box x 11   Entered and Authorized by:   Laren Everts MD   Signed by:   Laren Everts MD on 06/25/2009   Method used:   Print then Give to Patient   RxID:   [id number] METFORMIN HCL 500 MG TABS (METFORMIN HCL) Take 1 tablet by mouth two times a day  #60 x 5   Entered and Authorized by:   Laren Everts MD   Signed by:   Laren Everts MD on 06/25/2009   Method used:   Print then Give to Patient   RxID:   [id number] METFORMIN HCL 500 MG TABS (METFORMIN HCL) Take 1 tablet by mouth two times a day  #60 x 5   Entered and Authorized by:   Laren Everts MD   Signed by:   Laren Everts MD on 06/25/2009   Method used:   Print then Give to Patient   RxID:   [id number] ONETOUCH ULTRASOFT LANCETS  MISC (LANCETS) Use as directed to check blood sugar at least twice a day  #1 box x 11   Entered and Authorized by:   Laren Everts MD   Signed by:   Laren Everts MD on 06/25/2009   Method used:  Print then Give to Patient   RxID:   [id number] [facility] ULTRA TEST  STRP (GLUCOSE BLOOD) use as directed  #100 x 11   Entered and Authorized by:   Laren Everts MD   Signed by:   Laren Everts MD on 06/25/2009   Method used:   Print then Give to Patient   RxID:   [id number] PEPCID 20 MG TABS (FAMOTIDINE) Take 1 tablet by mouth two times a day  #60 x 11   Entered and Authorized by:   Laren Everts MD   Signed by:   Laren Everts MD on 06/25/2009   Method used:   Print then Give to Patient   RxID:    [id number] DONATUSSIN 02-01-14-100 MG/5ML SYRP (PE-CPM-DM-GG) Take 10ml by mouth every 6 hours as needed for cough  #430ml x 0   Entered and Authorized by:   Laren Everts MD   Signed by:   Laren Everts MD on 06/25/2009   Method used:   Print then Give to Patient   RxID:   [id number]    Laboratory Results   Blood Tests   Date/Time Received: June 25, 2009 10:28 AM Date/Time Reported: Alric Quan  June 25, 2009 10:28 AM   HGBA1C: 11.4%   (Normal Range: Non-Diabetic - 3-6%   Control Diabetic - 6-8%) CBG Fasting:: 278mg /dL       Prevention & Chronic Care Immunizations   Influenza vaccine: Historical  (02/23/2006)   Influenza vaccine deferral: Not indicated  (02/26/2009)    Tetanus booster: Not documented    Pneumococcal vaccine: Not documented    H. zoster vaccine: Not documented  Colorectal Screening   Hemoccult: Not documented    Colonoscopy: Not documented   Colonoscopy action/deferral: Refused  (02/26/2009)  Other Screening   Pap smear: Not documented   Pap smear action/deferral: Deferred  (02/26/2009)    Mammogram: Not documented   Mammogram action/deferral: Refused  (02/26/2009)    DXA bone density scan: Not documented   Smoking status: never  (06/25/2009)  Diabetes Mellitus   HgbA1C: 11.4  (06/25/2009)    Eye exam: Not documented   Diabetic eye exam action/deferral: Deferred  (02/26/2009)    Foot exam: yes  (02/26/2009)   Foot exam action/deferral: Do today   High risk foot: Not documented   Foot care education: Not documented   Foot exam due: 12/14/2006    Urine microalbumin/creatinine ratio: 134.8  (02/26/2009)   Urine microalbumin action/deferral: Ordered   Urine microalbumin/cr due: 11/25/2006    Diabetes flowsheet reviewed?: Yes   Progress toward A1C goal: Deteriorated  Lipids   Total Cholesterol: 161  (02/26/2009)   Lipid panel action/deferral: Lipid Panel ordered   LDL: 69  (02/26/2009)    LDL Direct: Not documented   HDL: 47  (02/26/2009)   Triglycerides: 226  (02/26/2009)   Lipid panel due: 02/24/2007    SGOT (AST): 15  (05/14/2008)   SGPT (ALT): 14  (05/14/2008)   Alkaline phosphatase: 99  (05/14/2008)   Total bilirubin: 0.9  (05/14/2008)    Lipid flowsheet reviewed?: Yes   Progress toward LDL goal: At goal  Hypertension   Last Blood Pressure: 159 / 89  (06/25/2009)   Serum creatinine: 1.23  (02/26/2009)   BMP action: Ordered   Serum potassium 4.2  (02/26/2009)    Hypertension flowsheet reviewed?: Yes   Progress toward BP goal: Improved  Self-Management Support :   Personal Goals (by the next clinic visit) :     Personal A1C goal: 7  (  02/26/2009)     Personal blood pressure goal: 130/80  (02/26/2009)     Personal LDL goal: 100  (02/26/2009)    Patient will work on the following items until the next clinic visit to reach self-care goals:     Medications and monitoring: take my medicines every day, bring all of my medications to every visit  (06/25/2009)     Eating: eat more vegetables, use fresh or frozen vegetables, eat foods that are low in salt  (06/25/2009)    Diabetes self-management support: Written self-care plan  (06/25/2009)   Diabetes care plan printed   Last diabetes self-management training by diabetes educator: 05/21/2008   Last medical nutrition therapy: 05/21/2008    Hypertension self-management support: Written self-care plan  (06/25/2009)   Hypertension self-care plan printed.    Lipid self-management support: Written self-care plan  (06/25/2009)   Lipid self-care plan printed.

## 2010-06-04 NOTE — Initial Assessments (Signed)
INTERNAL MEDICINE ADMISSION HISTORY AND PHYSICAL  PCP: Dr. Threasa Beards  1st contact Dr. Narda Bonds [phone] 2nd contact Dr Eben Burow [phone] Holidays or 5pm on weekdays:  1st contact [phone] 2nd contact [phone]  CC: Weakness  HPI: Patient is a 77 y/o female with PMH DM type II, GERD, HTN, and CAD s/p CABG who presents to the ED with a chief complaint of weakness.  The patient is [place] and speaks very limited [place], but her daughter was present to translate.  Per the patient at approximately 6am on the morning of admission she got sweaty and vomited twice and had 3 episodes of watery stool.  She then had a diminished appetite and felt weak all day, only eating a few spoonfuls of rice soup.  At 6pm she took her normal dose of 60u of lantus and shortly thereafter she began to feel chills and physically cold.  Her family thought she looked ill, so they brought her to the ED.  The patient endorses recent compliance with her medicines. She states that her blood sugars have been running in the 70s-80s for the past 2 weeks or so, and during this time she has felt weaker and slightly dizzy.  On arrival to the ED there is a report (but no written record) of a temperature of 54F, so the patient was placed under a Bair hugger, and when we went to examine her a few hours after her arrival she states that she is feeling much better, back to normal and her temp was 9F. Patient denies any fevers, chest pain, shortness of breath, abdominal pain, dysuria, frequency, urgency.  Endorses occasional cough that is chronic and unchanged.  ALLERGIES: NKDA  PAST MEDICAL HISTORY: Diabetes mellitus, type II - HgbA1c of 7.3% in Nov 2011 Renal insufficiency with baseline Cr 1.1.-1.2 GERD Hepatitis B Hyperlipidemia Hypertension Cervical dysplasia Atrophic vaginitis Positive PPD in 2008 (Normal CXR) CAD (April 2008 CABG with LIMA to the LAD, SVG to OM, SVG to distal right coronary artery and PDA;      cath May 2008  demonstrating  EF 40% and 99% stenosis at the anastomosis of graft to the PDA.) Spinal stenosis s/p decompression h/o UTI caused by E.Coli resistant to bactrim, ciprofloxacin, sensitive to aminoglycosides   MEDICATIONS: METFORMIN HCL 500 MG TABS (METFORMIN HCL) Take 2 tablets by mouth two times a day MULTIVITAMINS  TABS (MULTIPLE VITAMIN) Take 1 tablet by mouth once a day LANTUS 100 UNIT/ML SOLN (INSULIN GLARGINE) Inject 55 units subcutaneously at bedtime COZAAR 100 MG TABS (LOSARTAN POTASSIUM) Take 1 tablet by mouth daily COREG 25 MG TABS (CARVEDILOL) Take 1 tablet by mouth two times a day PLAVIX 75 MG TABS (CLOPIDOGREL BISULFATE) Take 1 tablet by mouth once a day ASPIR-LOW 81 MG TBEC (ASPIRIN) Take 1 tablet by mouth once a day NITROGLYCERIN 0.4 MG  SUBL (NITROGLYCERIN) take 1 tab under tongue every 5 min for max 3 doses ONETOUCH ULTRASOFT LANCETS  MISC (LANCETS) Check blood sugar at least twice a day ONETOUCH ULTRA TEST  STRP (GLUCOSE BLOOD) use as directed CRESTOR 20 MG TABS (ROSUVASTATIN CALCIUM) 1 by mouth daily DONATUSSIN 02-01-14-100 MG/5ML SYRP (PE-CPM-DM-GG) Take 10ml by mouth every 6 hours as needed for cough PEPCID 20 MG TABS (FAMOTIDINE) Take 1 tablet by mouth two times a day SB INSULIN SYRINGE 31G X 5/16" 1 ML MISC (INSULIN SYRINGE-NEEDLE U-100) use to inject lantus insulin one time a day- patient needs shortest  needles- 8mm or smaller PROMETHAZINE HCL 12.5 MG TABS (PROMETHAZINE HCL) Take 1  tablet by mouth every 4-6 hours as needed for nausea/vomiting HYDROCHLOROTHIAZIDE 25 MG TABS (HYDROCHLOROTHIAZIDE) Take one (1) by mouth daily TRAMADOL HCL 50 MG TABS (TRAMADOL HCL) Take 1 tablet by mouth three times a day as needed   SOCIAL HISTORY: She is a [place] immigrant.  She has been a housewife.  She is widowed and lives w/ her son. Tobacco - none Alcohol Use - no Illicit Drug Use - no   FAMILY HISTORY: Noncontributory.   ROS: As per HPI, all other systems reviewed and  negative  VITALS: T:(94?) 97  P:61  BP:201/86>>120/66  RR:16  O2SAT: 100% ON: RA  PHYSICAL EXAM: General:  alert, well-developed, and cooperative.  Patient speaks limited English but daughter able to translate. Head:  normocephalic and atraumatic.   Eyes:  vision grossly intact, pupils equal, pupils round, pupils reactive to light, no injection and anicteric.   Mouth:  pharynx pink and moist, no erythema, and no exudates.   Neck:  supple, full ROM   Lungs:  normal respiratory effort, normal breath sounds, no crackles, and no wheezes.  Heart:  normal rate, regular rhythm, no murmur.   Abdomen:  soft, non-tender, normal bowel sounds, no distention, no guarding, no rebound tenderness, no hepatomegaly, and no splenomegaly.   GU: no CVA tenderness Msk:  no joint swelling, no joint warmth, and no redness over joints.   Pulses:  1+ DP pulses bilaterally Extremities:  No edema  Neurologic:  alert & oriented X3, cranial nerves II-XII intact, strength normal in all extremities, sensation intact to light touch   Skin:  decreased turgor and no rashes.   Psych:  Oriented X3, memory intact for recent and remote, normally interactive, good eye contact, not anxious appearing, and not depressed appearing.  LABS:   WBC                                      7.8               4.0-10.5         K/uL  RBC                                      4.06              3.87-5.11        MIL/uL  Hemoglobin (HGB)                         11.7       l      12.0-15.0        g/dL  Hematocrit (HCT)                         36.5              36.0-46.0        %  MCV                                      89.9              78.0-100.0       fL  MCH -  28.8              26.0-34.0        pg  MCHC                                     32.1              30.0-36.0        g/dL  RDW                                      13.9              11.5-15.5        %  Platelet Count (PLT)                     205                150-400          K/uL  Neutrophils, %                           75                43-77            %  Lymphocytes, %                           18                12-46            %  Monocytes, %                             6                 3-12             %`  Eosinophils, %                           2                 0-5              %  Basophils, %                             0                 0-1              %  Neutrophils, Absolute                    5.8               1.7-7.7          K/uL  Lymphocytes, Absolute                    1.4               0.7-4.0          K/uL  Monocytes, Absolute  0.5               0.1-1.0          K/uL  Eosinophils, Absolute                    0.2               0.0-0.7          K/uL  Basophils, Absolute                      0.0               0.0-0.1          K/uL   Sodium (NA)                              137               135-145          mEq/L  Potassium (K)                            3.5               3.5-5.1          mEq/L  Chloride                                 99                96-112           mEq/L  CO2                                      24                19-32            mEq/L  Glucose                                  37         L      70-99            mg/dL  BUN                                      26         h      6-23             mg/dL  Creatinine                               1.32       h      0.4-1.2          mg/dL  GFR, Est Non African American            39         l      >60  mL/min  GFR, Est African American                47         l      >60              mL/min  Bilirubin, Total                         0.8               0.3-1.2          mg/dL  Alkaline Phosphatase                     42                39-117           U/L  SGOT (AST)                               21                0-37             U/L  SGPT (ALT)                               14                0-35             U/L  Total  Protein                            7.6               6.0-8.3          g/dL  Albumin-Blood                            4.3               3.5-5.2          g/dL  Calcium                                  9.9               8.4-10.5         mg/dL   Lipase                                   31                11-59            U/L    CKMB, POC                                1.1               1.0-8.0          ng/mL  Troponin I, POC                          <  0.05             0.00-0.09        ng/mL  Myoglobin, POC                           95.0              12-200           ng/mL   Protime ( Prothrombin Time)              13.0              11.6-15.2        seconds  INR                                      0.96              0.00-1.49   Color, Urine                             YELLOW            YELLOW  Appearance                               CLOUDY     a      CLEAR  Specific Gravity                         1.011             1.005-1.030  pH                                       7.0               5.0-8.0  Urine Glucose                            250        a      NEG              mg/dL  Bilirubin                                NEGATIVE          NEG  Ketones                                  NEGATIVE          NEG              mg/dL  Blood                                    NEGATIVE          NEG  Protein  30         a      NEG              mg/dL  Urobilinogen                             0.2               0.0-1.0          mg/dL  Nitrite                                  POSITIVE   a      NEG  Leukocytes                               TRACE      a      NEG   Squamous Epithelial / LPF                RARE              RARE  Casts / HPF                              SEE NOTE.  a      NEG    HYALINE CASTS  WBC / HPF                                0-2               <3               WBC/hpf  Bacteria / HPF                           MANY       a      RARE    CHEST - 2 VIEW  Comparison:  01/03/2010    Findings:   Upper normal-sized cardiac silhouette post CABG.   Atherosclerotic calcifications aorta.   Pulmonary vascularity normal.   Lungs clear.   No pleural effusion or pneumothorax.   Bones appear demineralized.    IMPRESSION:   Prior CABG.   No acute abnormalities.   ASSESSMENT AND PLAN:  (1) hypoglycemia in setting of DM, type II: HgbA1C 7.3 (03/2010). Pt has had nausea, vomiting and reduced by mouth intake for the past several days, with low CBG's reported at home for several weeks.  Lantus 60 Units and Metformin. In the ED her CBG was 37.  Plan: -Admit to tele for observation - check orthostatics -Check CBG q4h  -Reduce lantus to half her home dose -holding metformin -Full regular diet -Reglan prn for nausea.  () Hypothermia: most likely 2/2 to hypoglycemia, CXR was negative, UA only showed asymptomatic bacteruria, pt is normotensive, with no fever or whilte count which makes infection much less likely.  Will check TSH to r/o hyopthyroidism. However in the setting of hypothermia and pt is elderly female with h/o UTI, will treat asymptomatic bacteruria, see below.  () bacteruria: On UA patient has positive nitirite, trace leucocytes, however denies dysuria, urgency or change in urine color, given that patient is hypothermic we  will treat with Fosfomycin, given that her urine cultures were [facility] multidrug resistant in the past.   () HTN: well controlled, will continue home meds except for HCTZ given mild dehydration, Will continue to monitor.   () CAD: s/p CABG in 08/2006, denies CP, No acute EKG changes, however given her history will cycle cardiac enzymes X3 and monitor on TELE.   () chronic renal insufficiency: CKD stage 3:  Baseline Cr 1.1-1.25, Cr now 1.31, which is near her baseline.  Patient appears mildly dehydrated, will check Urine Na and give IVF.   () GERD:  Will provide protonix while hospitalized.  () VTE Proph: Lovenox   ATTENDING: I  performed and/or observed a history and physical examination of the patient.  I discussed the case with the residents as noted and reviewed the residents' notes.  I agree with the findings and plan--please refer to the attending physician note for more details.  Signature________________________________  Printed Name_____________________________

## 2010-06-04 NOTE — Progress Notes (Signed)
Summary: tired- appt/ hla  Phone Note Call from Patient   Caller: Daughter Summary of Call: daughter calls to say pt is tired all the time, has heartburn and has a cold w/ cough, runny nose x 1-2 wks. hard to gather info due to language barrier. appt offered for 2/16, states thurs or fri is best, fri am appt given. Initial call taken by: Marin Roberts RN,  June 17, 2009 2:20 PM  Follow-up for Phone Call        If her symptoms become worse can come to the [facility] if she can not follow the appointment this Friday.  Thanks Follow-up by: Jackson Latino MD,  June 18, 2009 11:39 AM

## 2010-06-04 NOTE — Letter (Signed)
Summary: BLOOD GLUCOSE LOG  BLOOD GLUCOSE LOG   Imported By: Margie Billet 08/05/2009 14:00:29  _____________________________________________________________________  External Attachment:    Type:   Image     Comment:   External Document

## 2010-06-04 NOTE — Assessment & Plan Note (Signed)
Summary: ACUTE/YANG/DM SUGARS RUNNING OVER 200/CH   Vital Signs:  Patient profile:   77 year old female Height:      61 inches Weight:      111.2 pounds BMI:     21.09 Temp:     97.5 degrees F oral Pulse rate:   71 / minute BP sitting:   143 / 77  (left arm) Cuff size:   regular  Vitals Entered By: Theotis Barrio NT II (December 29, 2009 11:45 AM) CC: SORE THROAT WITH PORDUCTIVE COUGH-SPUTUM THICK -GREENISH IN COLOR  X 1 MONTH -COUGHS ESPECIALY AT NIGHT Pain Assessment Patient in pain? yes     Location: THROAT Intensity:    8 Type:    SORE Onset of pain  FOR ABOUT A MONTH Nutritional Status BMI of 19 -24 = normal CBG Result 365  Have you ever been in a relationship where you felt threatened, hurt or afraid?No   Does patient need assistance? Functional Status Self care Ambulation Normal   Primary Care Jalee Saine:  Jackson Latino MD  CC:  SORE THROAT WITH PORDUCTIVE COUGH-SPUTUM THICK -GREENISH IN COLOR  X 1 MONTH -COUGHS ESPECIALY AT NIGHT.  History of Present Illness: The patient has not taken metformin for 2 months 55 units of lantus nightly. Patient is complaining of blurry vision and also headaches with some dizziness, increased fatigue.  Her blood sugards have been been running in the 300-400s since stopping the metformin.    Patient complains today of cough productive of green and red sputum with hard pieces that are red in color, possibly blood for the past 2 months.  Some sore throat.  Some subjective fevers and some chills.  She feels like these symptoms are getting worse.  Patient has not traveled overseas in 5 years.  h/o positive PPD - she took 9 months of the medicine.  Patient never smoked cigarettes.   For past few days pt has had occasional right sided, under her breast pain.  No difference with breathing. The pain feels like a pinch and lasts up to 30 minutes and comes and goes.  She takes NTG and the pain gets better within few minutes. States this pain is  different from her anginal pain only in location. Patient does not have an appointment with her cardiologist for follow-up,and last saw him in January 2011.  The pain is unrelated to exercise.  Can occur at rest.   She thinks it may be increasing in frequency.  Patient thinks she has been urinating more frequenty for the past 2 days with some "tightness" feeling with urination. Has h/o UTIs.  No headaches, nausea, abdominal pain, difficulty swallowing.  Preventive Screening-Counseling & Management  Alcohol-Tobacco     Smoking Status: never  Caffeine-Diet-Exercise     Does Patient Exercise: yes     Type of exercise: WALKING     Exercise (avg: min/session): FOR ABOUT 20 MIN     Times/week: 7  Current Problems (verified): 1)  Renal Insufficiency  (ICD-588.9) 2)  Uti  (ICD-599.0) 3)  Abnormal Sputum  (ICD-786.4) 4)  Preventive Health Care  (ICD-V70.0) 5)  Positive Ppd  (ICD-795.5) 6)  Cad  (ICD-414.00) 7)  Cough, Chronic  (ICD-786.2) 8)  Chest Pain  (ICD-786.50) 9)  Dysplasia, Cervix Nos  (ICD-622.10) 10)  Hysterectomy, Hx of  (ICD-V45.77) 11)  Hypertension  (ICD-401.9) 12)  Hyperlipidemia  (ICD-272.4) 13)  Hepatitis B, Hx of  (ICD-V12.09) 14)  Gerd  (ICD-530.81) 15)  Diabetes Mellitus, Type II  (ICD-250.00)  Current Medications (verified): 1)  Metformin Hcl 500 Mg Tabs (Metformin Hcl) .... Take 2 Tablets By Mouth Two Times A Day 2)  Multivitamins  Tabs (Multiple Vitamin) .... Take 1 Tablet By Mouth Once A Day 3)  Lantus 100 Unit/ml Soln (Insulin Glargine) .... Inject 55 Units Subcutaneously At Bedtime 4)  Cozaar 50 Mg Tabs (Losartan Potassium) .... Take 1 Tablet By Mouth Once A Day 5)  Coreg 25 Mg Tabs (Carvedilol) .... Take 1 Tablet By Mouth Two Times A Day 6)  Plavix 75 Mg Tabs (Clopidogrel Bisulfate) .... Take 1 Tablet By Mouth Once A Day 7)  Aspir-Low 81 Mg Tbec (Aspirin) .... Take 1 Tablet By Mouth Once A Day 8)  Nitroglycerin 0.4 Mg  Subl (Nitroglycerin) .... Take 1  Tablet Under Tongue Every 5 Min Foe A Maximum of 3 Doses 9)  Onetouch Ultrasoft Lancets  Misc (Lancets) .... Use As Directed To Check Blood Sugar At Least Twice A Day 10)  Onetouch Ultra Test  Strp (Glucose Blood) .... Use As Directed 11)  Crestor 20 Mg Tabs (Rosuvastatin Calcium) .Marland Kitchen.. 1 By Mouth Daily 12)  Donatussin 02-01-14-100 Mg/47ml Syrp (Pe-Cpm-Dm-Gg) .... Take 10ml By Mouth Every 6 Hours As Needed For Cough 13)  Pepcid 20 Mg Tabs (Famotidine) .... Take 1 Tablet By Mouth Two Times A Day 14)  Sb Insulin Syringe 31g X 5/16" 1 Ml Misc (Insulin Syringe-Needle U-100) .... Use To Inject Lantus Insulin One Time A Day- Patient Needs Shortest  Needles- 8mm or Smaller 15)  Levaquin 500 Mg Tabs (Levofloxacin) .... Take 1 Tablet By Mouth Daily For 10 Days  Allergies (verified): No Known Drug Allergies  Past History:  Family History: Last updated: 10/28/2008  Noncontributory.  Social History: Last updated: 12/29/2009 She is a [place] immigrant She has been a housewife.   She is widowed and lives w/ her son. Tobacco - none Alcohol Use - no Illicit Drug Use - no  Risk Factors: Exercise: yes (12/29/2009)  Risk Factors: Smoking Status: never (12/29/2009)  Past medical, surgical, family and social histories (including risk factors) reviewed for relevance to current acute and chronic problems.  Past Medical History: Reviewed history from 10/28/2008 and no changes required. Diabetes mellitus, type II GERD Hepatitis B Hyperlipidemia Hypertension Cervical dysplasia Atrophic vaginitis Positive PPD in 2008 (Normal CXR) CAD (April 2008 with LIMA to the LAD, SVG to OM, SVG to distal right coronary artery and PDA.  She had repeat cath along with this demonstrating 99% stenosis at the anastomosis of the graft to the PDA.  She was managed medically), Spinal stenosis s/p decompression  Past Surgical History: Back surgery (decompression L3-S1) Hysterectomy (cervical dysplasia) CABG -  April 2008  Family History: Reviewed history from 10/28/2008 and no changes required.  Noncontributory.  Social History: Reviewed history from 10/28/2008 and no changes required. She is a [place] immigrant She has been a housewife.   She is widowed and lives w/ her son. Tobacco - none Alcohol Use - no Illicit Drug Use - no Does Patient Exercise:  yes  Review of Systems       see HPI  Physical Exam  General:  NAD Mouth:  pharynx pink and moist, no erythema, no exudates, and no lesions.   Neck:  supple, full ROM, and no masses.   Chest Wall:  no tenderness.   Lungs:  normal respiratory effort, normal breath sounds, no crackles, and no wheezes.   Heart:  normal rate, regular rhythm, and no murmur.   Abdomen:  soft, non-tender, and normal bowel sounds.   Pulses:  diminished bilateral pedal pulses Extremities:  no edema bilaterally Neurologic:  alert & oriented X3.  CN grossly intact. gait wnl. Cervical Nodes:  no anterior cervical adenopathy.  no anterior cervical adenopathy.   Psych:  memory intact for recent and remote, normally interactive, good eye contact, not anxious appearing, and not depressed appearing.  memory intact for recent and remote, normally interactive, good eye contact, not anxious appearing, and not depressed appearing.     Impression & Recommendations:  Problem # 1:  ABNORMAL SPUTUM (ICD-786.4) Assessment New Pt has had bloody sputum for 2 months, green in color with red flecks or "pieces".  Pt with h/o positive PPD, s/p 9 month isoniazid treatment.  Will repeat CXR today.  Pt not a smoker and does not have COPD, however with h/o pneumonia and increased malaise and change in sputurm - Will give levaquin x10d to cover for possible infection.  f/u with [place] in 4 weeks.  Orders: Diagnostic X-Ray/Fluoroscopy (Diagnostic X-Ray/Flu)  Problem # 2:  UTI (ICD-599.0) Assessment: New UA showed positive nitrites, though negative leuks.  Will obtain culture and  fortunately levaquin given for #1 should cover for UTI.  Orders: T-Culture, Urine ([id number])  Her updated medication list for this problem includes:    Levaquin 500 Mg Tabs (Levofloxacin) .Marland Kitchen... Take 1 tablet by mouth daily for 10 days  Problem # 3:  CAD (ICD-414.00) Assessment: Deteriorated Patient with new right sided CP that resembles the angina she has had in the past.  EKG shows no changes from previous EKG from earlier this year.  Pt is diabetic with h/o CAD s/p CABG.  Last saw Dr. Antoine Poche (cards) in January 2011.  Will make referral for patient to be seen soon.  Will continue current medical management.  f/u with [place] in 4 weeks. Her updated medication list for this problem includes:    Cozaar 50 Mg Tabs (Losartan potassium) .Marland Kitchen... Take 1 tablet by mouth once a day    Coreg 25 Mg Tabs (Carvedilol) .Marland Kitchen... Take 1 tablet by mouth two times a day    Plavix 75 Mg Tabs (Clopidogrel bisulfate) .Marland Kitchen... Take 1 tablet by mouth once a day    Aspir-low 81 Mg Tbec (Aspirin) .Marland Kitchen... Take 1 tablet by mouth once a day    Nitroglycerin 0.4 Mg Subl (Nitroglycerin) .Marland Kitchen... Take 1 tablet under tongue every 5 min foe a maximum of 3 doses  Problem # 4:  HYPERLIPIDEMIA (ICD-272.4) Assessment: Comment Only Refilled crestor today. Needs FLP - can obtain at next visit in 4 weeks. CMet today. Her updated medication list for this problem includes:    Crestor 20 Mg Tabs (Rosuvastatin calcium) .Marland Kitchen... 1 by mouth daily  Problem # 5:  HYPERTENSION (ICD-401.9) Assessment: Improved Patient's BP improved but not quite at goal.  Will not change regiment for now, but continue meds at current dosing. Her updated medication list for this problem includes:    Cozaar 50 Mg Tabs (Losartan potassium) .Marland Kitchen... Take 1 tablet by mouth once a day    Coreg 25 Mg Tabs (Carvedilol) .Marland Kitchen... Take 1 tablet by mouth two times a day  Problem # 6:  DIABETES MELLITUS, TYPE II (ICD-250.00) Assessment: Comment Only Patient with very poor DM  control.  HgbA1c today and will need repeat in 3 months.  Will continue metformin - pt was explained dosing of 2 pills TWICE daily.  Pt historically hard to manage with insulin - refuses to check sugars more  frequently 2/2 cost.  Encouraged her to check her sugars once daily and we will reassess her insulin dosing once we have more data.  Patient was seen by Jamison Neighbor in April 2011. The patient would benefit from more frequent insulin dosing, however this may not be possible if she cannot check her sugars more frequently.  For now will continue with current Lantus dose of 55u nightly.     Her updated medication list for this problem includes:    Metformin Hcl 500 Mg Tabs (Metformin hcl) .Marland Kitchen... Take 2 tablets by mouth two times a day    Lantus 100 Unit/ml Soln (Insulin glargine) ..... Inject 55 units subcutaneously at bedtime    Cozaar 50 Mg Tabs (Losartan potassium) .Marland Kitchen... Take 1 tablet by mouth once a day    Aspir-low 81 Mg Tbec (Aspirin) .Marland Kitchen... Take 1 tablet by mouth once a day  Orders: T- Capillary Blood Glucose ([id number]) T-Hgb A1C (in-house) ([id number]) T-Urine Microalbumin w/creat. ratio [id number]) T-CMP with Estimated GFR ([id number]) Ophthalmology Referral (Ophthalmology)  Problem # 7:  RENAL INSUFFICIENCY (ICD-588.9) Assessment: Comment Only Likely 2/2 patient's poorly controlled diabetes. Cr in Oct 2010 1.23.  Ur microalb/creatinine and C-Met today.  pt on metformin - hopefully Cr not continuing to trend up (if so may need to consider stopping metformin).  Complete Medication List: 1)  Metformin Hcl 500 Mg Tabs (Metformin hcl) .... Take 2 tablets by mouth two times a day 2)  Multivitamins Tabs (Multiple vitamin) .... Take 1 tablet by mouth once a day 3)  Lantus 100 Unit/ml Soln (Insulin glargine) .... Inject 55 units subcutaneously at bedtime 4)  Cozaar 50 Mg Tabs (Losartan potassium) .... Take 1 tablet by mouth once a day 5)  Coreg 25 Mg Tabs (Carvedilol) .... Take 1 tablet by  mouth two times a day 6)  Plavix 75 Mg Tabs (Clopidogrel bisulfate) .... Take 1 tablet by mouth once a day 7)  Aspir-low 81 Mg Tbec (Aspirin) .... Take 1 tablet by mouth once a day 8)  Nitroglycerin 0.4 Mg Subl (Nitroglycerin) .... Take 1 tablet under tongue every 5 min foe a maximum of 3 doses 9)  Onetouch Ultrasoft Lancets Misc (Lancets) .... Use as directed to check blood sugar at least twice a day 10)  Onetouch Ultra Test Strp (Glucose blood) .... Use as directed 11)  Crestor 20 Mg Tabs (Rosuvastatin calcium) .Marland Kitchen.. 1 by mouth daily 12)  Donatussin 02-01-14-100 Mg/39ml Syrp (Pe-cpm-dm-gg) .... Take 10ml by mouth every 6 hours as needed for cough 13)  Pepcid 20 Mg Tabs (Famotidine) .... Take 1 tablet by mouth two times a day 14)  Sb Insulin Syringe 31g X 5/16" 1 Ml Misc (Insulin syringe-needle u-100) .... Use to inject lantus insulin one time a day- patient needs shortest  needles- 8mm or smaller 15)  Levaquin 500 Mg Tabs (Levofloxacin) .... Take 1 tablet by mouth daily for 10 days  Other Orders: Cardiology Referral (Cardiology) T-CBC No Diff ([id number]) 12 Lead EKG (12 Lead EKG) T-Urinalysis Dipstick only ([id number])  Patient Instructions: 1)  Please take the antibiotic levaquin 1 tablet by mouth daily for 10 days. 2)  You have been given a refill of your crestor (1 tablet daily) and metformin (2 tablets TWICE daily). 3)  Please continue to check your blood sugars every day. 4)  You will be called if any of your lab results are abnormal. 5)  Today you will get a chest X-ray.  If there are any abnormalities, you will be called. 6)  You will be called with an appointment to see your cardiologist soon. 7)  You will be called with an appointment to see an ophthalmologist for an eye exam.  8)  Please follow-up with [place] in 4 weeks, with Dr. Threasa Beards, if possible, for morning (fasting) appointment. Prescriptions: CRESTOR 20 MG TABS (ROSUVASTATIN CALCIUM) 1 by mouth daily  #30 x 11   Entered and  Authorized by:   Danelle Berry, MD   Signed by:   Danelle Berry, MD on 12/29/2009   Method used:   Electronically to        [facility] [phone]* (retail)       [address], [place]  [address]       Ph: [phone] or [phone]       Fax: [phone]   RxID:   [id number] METFORMIN HCL 500 MG TABS (METFORMIN HCL) Take 2 tablets by mouth two times a day  #120 x 11   Entered and Authorized by:   Danelle Berry, MD   Signed by:   Danelle Berry, MD on 12/29/2009   Method used:   Electronically to        [facility] [phone]* (retail)       [address], [place]  [address]       Ph: [phone] or [phone]       Fax: [phone]   RxID:   [id number] LEVAQUIN 500 MG TABS (LEVOFLOXACIN) Take 1 tablet by mouth daily for 10 days  #10 x 0   Entered and Authorized by:   Danelle Berry, MD   Signed by:   Danelle Berry, MD on 12/29/2009   Method used:   [facility] to        [facility] [phone]* (retail)       [address], [place]  [address]       Ph: [phone] or [phone]       Fax: [phone]   RxID:   [id number]  Process Orders Check Orders Results:     [facility]: Check successful Tests Sent for requisitioning (December 29, 2009 3:33 PM):     12/29/2009: [facility] -- T-Urine Microalbumin w/creat. ratio [[id number]] (signed)     12/29/2009: [facility] -- T-CBC No Diff [[id number]] (signed)     12/29/2009: [facility] -- T-CMP with Estimated GFR [[id number]] (signed)     12/29/2009: [facility] -- T-Culture, Urine [[id number]] (signed)     Prevention & Chronic Care Immunizations   Influenza vaccine: Historical  (02/23/2006)   Influenza vaccine deferral: Not indicated  (02/26/2009)    Tetanus booster: Not documented    Pneumococcal vaccine: Not documented    H. zoster vaccine: Not documented  Colorectal  Screening   Hemoccult: Not documented   Hemoccult action/deferral: Refused  (07/11/2009)    Colonoscopy: Not documented   Colonoscopy action/deferral: Refused  (02/26/2009)  Other Screening   Pap smear: Not documented   Pap smear action/deferral: Not indicated S/P hysterectomy  (07/11/2009)    Mammogram: Not documented   Mammogram action/deferral: Refused  (02/26/2009)    DXA bone density scan: Not documented   Smoking status: never  (12/29/2009)  Diabetes Mellitus   HgbA1C: 10.6  (12/29/2009)    Eye exam: Not documented   Diabetic eye exam action/deferral: Ophthalmology referral  (12/29/2009)  Foot exam: yes  (02/26/2009)   Foot exam action/deferral: Do today   High risk foot: Not documented   Foot care education: Not documented   Foot exam due: 12/14/2006    Urine microalbumin/creatinine ratio: 134.8  (02/26/2009)   Urine microalbumin action/deferral: Ordered   Urine microalbumin/cr due: 11/25/2006    Diabetes flowsheet reviewed?: Yes   Progress toward A1C goal: Deteriorated  Lipids   Total Cholesterol: 161  (02/26/2009)   Lipid panel action/deferral: Lipid Panel ordered   LDL: 69  (02/26/2009)   LDL Direct: Not documented   HDL: 47  (02/26/2009)   Triglycerides: 226  (02/26/2009)   Lipid panel due: 02/24/2007    SGOT (AST): 15  (05/14/2008)   SGPT (ALT): 14  (05/14/2008)   Alkaline phosphatase: 99  (05/14/2008)   Total bilirubin: 0.9  (05/14/2008)    Lipid flowsheet reviewed?: Yes   Progress toward LDL goal: Unchanged  Hypertension   Last Blood Pressure: 143 / 77  (12/29/2009)   Serum creatinine: 1.23  (02/26/2009)   BMP action: Ordered   Serum potassium 4.2  (02/26/2009)    Hypertension flowsheet reviewed?: Yes   Progress toward BP goal: Improved  Self-Management Support :   Personal Goals (by the next clinic visit) :     Personal A1C goal: 7  (02/26/2009)     Personal blood pressure goal: 130/80  (02/26/2009)     Personal LDL goal: 100   (02/26/2009)    Patient will work on the following items until the next clinic visit to reach self-care goals:     Medications and monitoring: take my medicines every day, check my blood sugar, bring all of my medications to every visit, examine my feet every day  (12/29/2009)     Eating: drink diet soda or water instead of juice or soda, eat more vegetables, use fresh or frozen vegetables, eat foods that are low in salt, eat baked foods instead of fried foods, eat fruit for snacks and desserts, limit or avoid alcohol  (12/29/2009)     Activity: take a 30 minute walk every day  (12/29/2009)    Diabetes self-management support: Education handout, Resources for patients handout, Written self-care plan  (07/11/2009)   Last diabetes self-management training by diabetes educator: 08/01/2009   Last medical nutrition therapy: 05/21/2008    Hypertension self-management support: Education handout, Resources for patients handout, Written self-care plan  (07/11/2009)    Lipid self-management support: Education handout, Resources for patients handout, Written self-care plan  (07/11/2009)    Nursing Instructions: Refer for screening diabetic eye exam (see order)    Laboratory Results   Urine Tests  Date/Time Received: 12/29/09  1:01PM Date/Time Reported: same  Routine Urinalysis   Color: yellow Appearance: Clear Glucose: >=1000   (Normal Range: Negative) Bilirubin: negative   (Normal Range: Negative) Ketone: negative   (Normal Range: Negative) Spec. Gravity: 1.015   (Normal Range: 1.003-1.035) Blood: negative   (Normal Range: Negative) pH: 6.5   (Normal Range: 5.0-8.0) Protein: 30   (Normal Range: Negative) Urobilinogen: 0.2   (Normal Range: 0-1) Nitrite: positive   (Normal Range: Negative) Leukocyte Esterace: negative   (Normal Range: Negative)     Blood Tests   Date/Time Received: December 29, 2009 1:21 PM Date/Time Reported: Burke Keels  December 29, 2009 1:21 PM   HGBA1C:  10.6%   (Normal Range: Non-Diabetic - 3-6%   Control Diabetic - 6-8%) CBG Random:: 365mg /dL         Appended Document: ACUTE/YANG/DM SUGARS  RUNNING OVER 200/CH I discussed Ms Hallstrom with Dr Claudette Laws and I agree with her note above. Due to pul sxs it would be worthwhile to obtain CXR but emperically tx for a PNA. If the hemoptysis doesn;t improve following the ABX, she will need further imaging and evaluation. The CP is atypical but pt is a female with known CAD and poorly controlled DM. EKG shows no change. Refer pt back to Dr Antoine Poche.

## 2010-06-04 NOTE — Progress Notes (Signed)
Summary: refill/gg  Phone Note Refill Request  on August 12, 2009 11:51 AM  Refills Requested: Medication #1:  LANTUS 100 UNIT/ML SOLN Inject 55 units subcutaneously at bedtime   Dosage confirmed as above?Dosage Confirmed   Last Refilled: 05/07/2009  Method Requested: Electronic Initial call taken by: Merrie Roof RN,  August 12, 2009 11:51 AM  Follow-up for Phone Call        Rx completed in Dr. Tiajuana Amass Follow-up by: Jackson Latino MD,  August 12, 2009 3:07 PM    Prescriptions: LANTUS 100 UNIT/ML SOLN (INSULIN GLARGINE) Inject 55 units subcutaneously at bedtime  #1 bottle x 6   Entered and Authorized by:   Jackson Latino MD   Signed by:   Jackson Latino MD on 08/12/2009   Method used:   [facility] to        [facility] [phone]* (retail)       [address]       [place], [place]  [address]       Ph: [phone] or [phone]       Fax: [phone]   RxID:   [id number]

## 2010-06-04 NOTE — Assessment & Plan Note (Signed)
Summary: Bianca training/vs  Is Patient Diabetic? Yes Did you bring your meter with you today? Yes   Allergies: No Known Drug Allergies   Complete Medication List: 1)  Metformin Hcl 500 Mg Tabs (Metformin hcl) .... Take 2 tablets by mouth two times a day 2)  Multivitamins Tabs (Multiple vitamin) .... Take 1 tablet by mouth once a day 3)  Lantus 100 Unit/ml Soln (Insulin glargine) .... Inject 55 units subcutaneously at bedtime 4)  Cozaar 50 Mg Tabs (Losartan potassium) .... Take 1 tablet by mouth once a day 5)  Coreg 25 Mg Tabs (Carvedilol) .... Take 1 tablet by mouth two times a day 6)  Plavix 75 Mg Tabs (Clopidogrel bisulfate) .... Take 1 tablet by mouth once a day 7)  Aspir-low 81 Mg Tbec (Aspirin) .... Take 1 tablet by mouth once a day 8)  Nitroglycerin 0.4 Mg Subl (Nitroglycerin) .... Take 1 tablet under tongue every 5 min foe a maximum of 3 doses 9)  Onetouch Ultrasoft Lancets Misc (Lancets) .... Use as directed to check blood sugar at least twice a day 10)  Onetouch Ultra Test Strp (Glucose blood) .... Use as directed 11)  Crestor 20 Mg Tabs (Rosuvastatin calcium) .Marland Kitchen.. 1 by mouth daily 12)  Donatussin 02-01-14-100 Mg/40ml Syrp (Pe-cpm-Bianca-gg) .... Take 10ml by mouth every 6 hours as needed for cough 13)  Pepcid 20 Mg Tabs (Famotidine) .... Take 1 tablet by mouth two times a day 14)  Sb Insulin Syringe 31g X 5/16" 1 Ml Misc (Insulin syringe-needle u-100) .... Use to inject lantus insulin one time a day  Other Orders: DSMT(Medicare) Individual, 30 Minutes (G0108)  Diabetes Self Management Training  PCP: Jackson Latino MD Referring MD: wilson Date diagnosed with diabetes: 05/03/2000 Diabetes Type: Type 2 insulin treated Other persons present: Other family-daughter is translating Current smoking Status: never  Assessment Work Hours: Not currently working Sources of Support: family- lives with son Special needs or Barriers: per daughter cannot see well enough to record blood  sugars in logbook. drew up 65 unit sof insulin instead of 55 Affect: Appropriate  Potential Barriers  Visual Impairment  Transportation  Economic/Supplies  Low Literacy  Non-English Speaking  Diabetes Medications:  Lipid lowering Meds? Yes Anti-platelet Meds? Yes Herbs or Supplements: Yes Comments: Meter showed increasing CBgs throughout day indicating insulin openia. weight is within normal limits so unable to use weight loss as tool to improve CBgs/A1C. Doubt patient as been taking lantus  insulin as prescribed for several reasons: 1- says she has been taking 60 units each day, drew up 64 in my office and 55 units is listed on her medication record, 2- has only been getting 1 vial whihc is only enough to take 30 units a day and prescription ran out in October 2010, 3- patient and daughter resport they have been paying cash for the lantus, but it is very expensive 60-80.00 per vial- which most persons would call us or question. Suggest starting with recalculated dose based on 0.5 units/kg = TDD= 25 units /day, suggest 50%or less of this be reinitiated as  lantus with  small - 2-3 units meal time doses . then follow-up to increase either the meal time or basal one or the other at each visit until targets reached. will need ot increae slef moniroting to 3-4 times a day at least with multiple daily injections    Monitoring Self monitoring blood glucose 2 times a day Name of Meter  One Touch Ultrasmart  Recent Episodes of: Requiring  Help from another person  Hyperglycemia : Yes Hypoglycemia: Yes Severe Hypoglycemia : No  Other Assessment Had an amputation due to diabetes? No   [employer] for Low Blood sugar No Can you tell if your blood sugar is low? Yes    Activity Limitations  Appropriate physical activity Would you  say you are physically active: Yes Diabetes Disease Process  Discussed today  Medications State name-action-dose-duration-side effects-and time to take  medication: Needs review/assistance   State appropriate timing of food related to medication: Needs review/assistance   Demonstrates/verbalizes site selection and rotation for injections Needs review/assistance   Correctly draw up and administer insulin-[place]-Symlin-glucagon: Needs review/assistance    Nutritional Management  Monitoring State purpose and frequency of monitoring BG-ketones-HgbA1C  : Needs review/assistance   Perform glucose monitoring/ketone testing and record results correctly: Needs review/assistance    State target blood glucose and HgbA1C goals: Needs review/assistance    Complications State the causes-signs and symptoms and prevention of Hyperglycemia: Needs review/assistance   Explain proper treatment of hyperglycemia: Needs review/assistance   State the causes- signs and symptoms and prevention of hypoglycemia: Needs review/assistance   Explain proper treatment of hypoglycemia: Demonstrates competency    Exercise Diabetes Management Education Done: 08/01/2009        Daugher request change in insulin as soon as possible so when patient goes on [place] in June- she will have her diabetes controlled and insulin pens in use and comfortable with them.   New One touch mini meter provided today with clock set.   strongly encouraged insulin pen today and despite patient practicing and daughter 's encouragement she was not ready to change. Has been paying cash for syringes and just bought some and gave this as a reason not ot change to pens today. Suggested to daughter that Neylan continue using syringes for lantus and usepens for meal time dose which she needs to improve her increasing daytime blood sugars. Lantus dose will need to be decreased to accommodate meal time doses as [employer] mostly in target wiht a few hypoglycemic.   F/up: ASAP   Self management support: son, daughter and clinic staff   Appended Document: Bianca training/vs note: patient brought  pill bottles today- reports that she is back to tkaing metformin 500 mg twice daily because the increased dose caused dizziness.

## 2010-06-04 NOTE — Letter (Signed)
Summary: DIABETIC LOGBOOK-07/30-08/28/2011  DIABETIC LOGBOOK-07/30-08/28/2011   Imported By: Shon Hough 01/06/2010 11:48:32  _____________________________________________________________________  External Attachment:    Type:   Image     Comment:   External Document

## 2010-06-04 NOTE — Progress Notes (Signed)
Summary: syringe prescription/dmr  Phone Note Outgoing Call   Call placed by: Jamison Neighbor RD,CDE,  August 01, 2009 4:15 PM Summary of Call: needs prescriptions for syringes    New/Updated Medications: SB INSULIN SYRINGE 31G X 5/16" 1 ML MISC (INSULIN SYRINGE-NEEDLE U-100) use to inject lantus insulin one time a day- patient needs shortest  needles- 8mm or smaller Prescriptions: SB INSULIN SYRINGE 31G X 5/16" 1 ML MISC (INSULIN SYRINGE-NEEDLE U-100) use to inject lantus insulin one time a day- patient needs shortest  needles- 8mm or smaller  #100 x 5   Entered by:   Jamison Neighbor RD,CDE   Authorized by:   Jackson Latino MD   Signed by:   Jackson Latino MD on 08/06/2009   Method used:   Electronically to        [facility]. [phone]* (retail)       [address]       [place], [place]  [address]       Ph: [phone] or [phone]       Fax: [phone]   RxID:   [id number]

## 2010-06-04 NOTE — Progress Notes (Signed)
Summary: appt/ hla  Phone Note Call from Patient   Summary of Call: relayed by charsetta, pt's daughter calls for appt, pt cbg >200 and she is sleepy= lethargic. appt given for pm, has not been to clinic since march 2011 Initial call taken by: Marin Roberts RN,  December 25, 2009 1:56 PM  Follow-up for Phone Call        Agree with the need to be seen in clinic if she is lethargic without explaination. Follow-up by: Doneen Poisson MD,  December 25, 2009 2:01 PM

## 2010-06-04 NOTE — Assessment & Plan Note (Signed)
Summary: EST- CALLED FOR INTER FOR CAMBODIAN/CFB   Vital Signs:  Patient profile:   77 year old female Height:      61 inches Weight:      110 pounds BMI:     20.86 Temp:     97.0 degrees F oral Pulse rate:   71 / minute BP sitting:   150 / 82  (right arm)  Vitals Entered By: Filomena Jungling NT II (July 11, 2009 10:34 AM) CC: CHECK-UP Is Patient Diabetic? Yes Did you bring your meter with you today? Yes Pain Assessment Patient in pain? no      Nutritional Status BMI of 19 -24 = normal  Does patient need assistance? Functional Status Self care Ambulation Normal   Primary Care Provider:  Jackson Latino MD  CC:  CHECK-UP.  History of Present Illness: Patient is a 77 yr old woman with PMH of poorly controlled DM, HTN, and CAD  comes to the clinic for follow up for her CBG. She doesn't speak [place] and the daughter accompanies with her and can speak [place]. Patient reports that she CBGs were usually above 300 and has been taking her metformin and lantus 55 units as instructed, no CP or SOB or abdominal pain, denies diarrhea or urination problem.    Preventive Screening-Counseling & Management  Alcohol-Tobacco     Smoking Status: never  Problems Prior to Update: 1)  Headache  (ICD-784.0) 2)  Preventive Health Care  (ICD-V70.0) 3)  Rlq Pain  (ICD-789.03) 4)  Urinary Frequency  (ICD-788.41) 5)  Pneumonia, Left Lower Lobe  (ICD-481) 6)  Closed Fracture of Unspecified Part of Humerus  (ICD-812.20) 7)  Sprain/strain, Back Nos  (ICD-847.9) 8)  Positive Ppd  (ICD-795.5) 9)  Cellulitis  (ICD-682.9) 10)  Cad  (ICD-414.00) 11)  Dental Pain  (ICD-525.9) 12)  Cough, Chronic  (ICD-786.2) 13)  Chest Pain  (ICD-786.50) 14)  Dysplasia, Cervix Nos  (ICD-622.10) 15)  Hysterectomy, Hx of  (ICD-V45.77) 16)  Hypertension  (ICD-401.9) 17)  Hyperlipidemia  (ICD-272.4) 18)  Hepatitis B, Hx of  (ICD-V12.09) 19)  Gerd  (ICD-530.81) 20)  Diabetes Mellitus, Type II   (ICD-250.00)  Medications Prior to Update: 1)  Metformin Hcl 500 Mg Tabs (Metformin Hcl) .... Take 1 Tablet By Mouth Two Times A Day 2)  Multivitamins  Tabs (Multiple Vitamin) .... Take 1 Tablet By Mouth Once A Day 3)  Lantus 100 Unit/ml Soln (Insulin Glargine) .... Inject 55 Units Subcutaneously At Bedtime 4)  Cozaar 50 Mg Tabs (Losartan Potassium) .... Take 1 Tablet By Mouth Once A Day 5)  Coreg 25 Mg Tabs (Carvedilol) .... Take 1 Tablet By Mouth Two Times A Day 6)  Plavix 75 Mg Tabs (Clopidogrel Bisulfate) .... Take 1 Tablet By Mouth Once A Day 7)  Aspir-Low 81 Mg Tbec (Aspirin) .... Take 1 Tablet By Mouth Once A Day 8)  Nitroglycerin 0.4 Mg  Subl (Nitroglycerin) .... Take 1 Tablet Under Tongue Every 5 Min Foe A Maximum of 3 Doses 9)  Onetouch Ultrasoft Lancets  Misc (Lancets) .... Use As Directed To Check Blood Sugar At Least Twice A Day 10)  Onetouch Ultra Test  Strp (Glucose Blood) .... Use As Directed 11)  Crestor 20 Mg Tabs (Rosuvastatin Calcium) .Marland Kitchen.. 1 By Mouth Daily 12)  Donatussin 02-01-14-100 Mg/45ml Syrp (Pe-Cpm-Dm-Gg) .... Take 10ml By Mouth Every 6 Hours As Needed For Cough 13)  Pepcid 20 Mg Tabs (Famotidine) .... Take 1 Tablet By Mouth Two Times A Day  Current Medications (  verified): 1)  Metformin Hcl 500 Mg Tabs (Metformin Hcl) .... Take 1 Tablet By Mouth Two Times A Day 2)  Multivitamins  Tabs (Multiple Vitamin) .... Take 1 Tablet By Mouth Once A Day 3)  Lantus 100 Unit/ml Soln (Insulin Glargine) .... Inject 55 Units Subcutaneously At Bedtime 4)  Cozaar 50 Mg Tabs (Losartan Potassium) .... Take 1 Tablet By Mouth Once A Day 5)  Coreg 25 Mg Tabs (Carvedilol) .... Take 1 Tablet By Mouth Two Times A Day 6)  Plavix 75 Mg Tabs (Clopidogrel Bisulfate) .... Take 1 Tablet By Mouth Once A Day 7)  Aspir-Low 81 Mg Tbec (Aspirin) .... Take 1 Tablet By Mouth Once A Day 8)  Nitroglycerin 0.4 Mg  Subl (Nitroglycerin) .... Take 1 Tablet Under Tongue Every 5 Min Foe A Maximum of 3 Doses 9)   Onetouch Ultrasoft Lancets  Misc (Lancets) .... Use As Directed To Check Blood Sugar At Least Twice A Day 10)  Onetouch Ultra Test  Strp (Glucose Blood) .... Use As Directed 11)  Crestor 20 Mg Tabs (Rosuvastatin Calcium) .Marland Kitchen.. 1 By Mouth Daily 12)  Donatussin 02-01-14-100 Mg/31ml Syrp (Pe-Cpm-Dm-Gg) .... Take 10ml By Mouth Every 6 Hours As Needed For Cough 13)  Pepcid 20 Mg Tabs (Famotidine) .... Take 1 Tablet By Mouth Two Times A Day  Allergies (verified): No Known Drug Allergies  Past History:  Past Medical History: Last updated: 10/28/2008 Diabetes mellitus, type II GERD Hepatitis B Hyperlipidemia Hypertension Cervical dysplasia Atrophic vaginitis Positive PPD in 2008 (Normal CXR) CAD (April 2008 with LIMA to the LAD, SVG to OM, SVG to distal right coronary artery and PDA.  She had repeat cath along with this demonstrating 99% stenosis at the anastomosis of the graft to the PDA.  She was managed medically), Spinal stenosis s/p decompression  Past Surgical History: Last updated: 10/28/2008 Back surgery (decompression L3-S1) Hysterectomy (cervical dysplasia) CABG  Family History: Last updated: 10/28/2008  Noncontributory.  Social History: Last updated: 10/28/2008 She is a [place] immigrant She has been a housewife.   She is widowed and lives w/ her son. Alcohol Use - no Illicit Drug Use - no  Risk Factors: Smoking Status: never (07/11/2009)  Family History: Reviewed history from 10/28/2008 and no changes required.  Noncontributory.  Social History: Reviewed history from 10/28/2008 and no changes required. She is a [place] immigrant She has been a housewife.   She is widowed and lives w/ her son. Alcohol Use - no Illicit Drug Use - no  Review of Systems  The patient denies anorexia, fever, decreased hearing, chest pain, dyspnea on exertion, peripheral edema, prolonged cough, headaches, abdominal pain, melena, hematuria, and abnormal bleeding.    Physical  Exam  General:  alert, well-developed, well-nourished, and well-hydrated.   Head:  normocephalic.   Ears:  no external deformities.   Nose:  no external erythema and no nasal discharge.   Mouth:  pharynx pink and moist.   Neck:  supple.   Lungs:  normal respiratory effort, normal breath sounds, no crackles, and no wheezes.   Heart:  normal rate, regular rhythm, no murmur, no gallop, and no JVD.   Abdomen:  soft, non-tender, normal bowel sounds, no distention, and no masses.   Msk:  normal ROM, no joint tenderness, no joint swelling, and no joint warmth.   Pulses:  2+ Extremities:  No edema. Neurologic:  alert & oriented X3, cranial nerves II-XII intact, strength normal in all extremities, sensation intact to light touch, gait normal, and DTRs symmetrical  and normal.     Impression & Recommendations:  Problem # 1:  DIABETES MELLITUS, TYPE II (ICD-250.00) Assessment Deteriorated Her CBG is poorly controlled, she said she has been taking metformin 500 mg two times a day  and lantus 55 daily recently. Because her CBg mostly above 300, will increase metformin to 1000 mg two times a day and recheck in 2-3 months. May increase lantus if still poorly controlled after maximizing metformin. Also will have DM education with Jamison Neighbor. She denies eye exam. Her updated medication list for this problem includes:    Metformin Hcl 500 Mg Tabs (Metformin hcl) .Marland Kitchen... Take 2 tablets by mouth two times a day    Lantus 100 Unit/ml Soln (Insulin glargine) ..... Inject 55 units subcutaneously at bedtime    Cozaar 50 Mg Tabs (Losartan potassium) .Marland Kitchen... Take 1 tablet by mouth once a day    Aspir-low 81 Mg Tbec (Aspirin) .Marland Kitchen... Take 1 tablet by mouth once a day  Labs Reviewed: Creat: 1.23 (02/26/2009)   Microalbumin: 9.94 (11/25/2005) Reviewed HgBA1c results: 11.4 (06/25/2009)  10.3 (02/26/2009)  Orders: Diabetic Clinic Referral (Diabetic)  Problem # 2:  HYPERTENSION (ICD-401.9) Assessment: Unchanged Her  BP fairly controlled, will continue the current medications.  Her updated medication list for this problem includes:    Cozaar 50 Mg Tabs (Losartan potassium) .Marland Kitchen... Take 1 tablet by mouth once a day    Coreg 25 Mg Tabs (Carvedilol) .Marland Kitchen... Take 1 tablet by mouth two times a day  BP today: 150/82 Prior BP: 159/89 (06/25/2009)  Labs Reviewed: K+: 4.2 (02/26/2009) Creat: : 1.23 (02/26/2009)   Chol: 161 (02/26/2009)   HDL: 47 (02/26/2009)   LDL: 69 (02/26/2009)   TG: 226 (02/26/2009)  Problem # 3:  Preventive Health Care (ICD-V70.0) Assessment: Comment Only She denies mamogram and colonoscopy or stool hemo card at this visit.  Complete Medication List: 1)  Metformin Hcl 500 Mg Tabs (Metformin hcl) .... Take 2 tablets by mouth two times a day 2)  Multivitamins Tabs (Multiple vitamin) .... Take 1 tablet by mouth once a day 3)  Lantus 100 Unit/ml Soln (Insulin glargine) .... Inject 55 units subcutaneously at bedtime 4)  Cozaar 50 Mg Tabs (Losartan potassium) .... Take 1 tablet by mouth once a day 5)  Coreg 25 Mg Tabs (Carvedilol) .... Take 1 tablet by mouth two times a day 6)  Plavix 75 Mg Tabs (Clopidogrel bisulfate) .... Take 1 tablet by mouth once a day 7)  Aspir-low 81 Mg Tbec (Aspirin) .... Take 1 tablet by mouth once a day 8)  Nitroglycerin 0.4 Mg Subl (Nitroglycerin) .... Take 1 tablet under tongue every 5 min foe a maximum of 3 doses 9)  Onetouch Ultrasoft Lancets Misc (Lancets) .... Use as directed to check blood sugar at least twice a day 10)  Onetouch Ultra Test Strp (Glucose blood) .... Use as directed 11)  Crestor 20 Mg Tabs (Rosuvastatin calcium) .Marland Kitchen.. 1 by mouth daily 12)  Donatussin 02-01-14-100 Mg/19ml Syrp (Pe-cpm-dm-gg) .... Take 10ml by mouth every 6 hours as needed for cough 13)  Pepcid 20 Mg Tabs (Famotidine) .... Take 1 tablet by mouth two times a day  Patient Instructions: 1)  Please schedule a follow-up appointment in 2 months. 2)  Check your blood sugars regularly. If  your readings are usually above : or below 70 you should contact our office. 3)  It is important that your Diabetic A1c level is checked every 3 months. 4)  Check your Blood Pressure regularly. If  it is above: you should make an appointment. Prescriptions: METFORMIN HCL 500 MG TABS (METFORMIN HCL) Take 2 tablets by mouth two times a day  #62 x 12   Entered and Authorized by:   Jackson Latino MD   Signed by:   Jackson Latino MD on 07/11/2009   Method used:   Print then Give to Patient   RxID:   [id number]   Prevention & Chronic Care Immunizations   Influenza vaccine: Historical  (02/23/2006)   Influenza vaccine deferral: Not indicated  (02/26/2009)    Tetanus booster: Not documented    Pneumococcal vaccine: Not documented    H. zoster vaccine: Not documented  Colorectal Screening   Hemoccult: Not documented   Hemoccult action/deferral: Refused  (07/11/2009)    Colonoscopy: Not documented   Colonoscopy action/deferral: Refused  (02/26/2009)  Other Screening   Pap smear: Not documented   Pap smear action/deferral: Not indicated S/P hysterectomy  (07/11/2009)    Mammogram: Not documented   Mammogram action/deferral: Refused  (02/26/2009)    DXA bone density scan: Not documented  Reports requested:   Last Pap report requested.  Smoking status: never  (07/11/2009)  Diabetes Mellitus   HgbA1C: 11.4  (06/25/2009)    Eye exam: Not documented   Diabetic eye exam action/deferral: Refused  (07/11/2009)    Foot exam: yes  (02/26/2009)   Foot exam action/deferral: Do today   High risk foot: Not documented   Foot care education: Not documented   Foot exam due: 12/14/2006    Urine microalbumin/creatinine ratio: 134.8  (02/26/2009)   Urine microalbumin action/deferral: Ordered   Urine microalbumin/cr due: 11/25/2006    Diabetes flowsheet reviewed?: Yes   Progress toward A1C goal: Deteriorated  Lipids   Total Cholesterol: 161  (02/26/2009)   Lipid panel  action/deferral: Lipid Panel ordered   LDL: 69  (02/26/2009)   LDL Direct: Not documented   HDL: 47  (02/26/2009)   Triglycerides: 226  (02/26/2009)   Lipid panel due: 02/24/2007    SGOT (AST): 15  (05/14/2008)   SGPT (ALT): 14  (05/14/2008)   Alkaline phosphatase: 99  (05/14/2008)   Total bilirubin: 0.9  (05/14/2008)    Lipid flowsheet reviewed?: Yes   Progress toward LDL goal: At goal  Hypertension   Last Blood Pressure: 150 / 82  (07/11/2009)   Serum creatinine: 1.23  (02/26/2009)   BMP action: Ordered   Serum potassium 4.2  (02/26/2009)    Hypertension flowsheet reviewed?: Yes   Progress toward BP goal: Unchanged  Self-Management Support :   Personal Goals (by the next clinic visit) :     Personal A1C goal: 7  (02/26/2009)     Personal blood pressure goal: 130/80  (02/26/2009)     Personal LDL goal: 100  (02/26/2009)    Patient will work on the following items until the next clinic visit to reach self-care goals:     Medications and monitoring: take my medicines every day, bring all of my medications to every visit  (07/11/2009)     Eating: drink diet soda or water instead of juice or soda, eat more vegetables, use fresh or frozen vegetables, eat foods that are low in salt, eat baked foods instead of fried foods  (07/11/2009)    Diabetes self-management support: Education handout, Resources for patients handout, Written self-care plan  (07/11/2009)   Diabetes care plan printed   Diabetes education handout printed   Last diabetes self-management training by diabetes educator: 05/21/2008   Referred for diabetes self-mgmt  training.   Last medical nutrition therapy: 05/21/2008    Hypertension self-management support: Education handout, Resources for patients handout, Written self-care plan  (07/11/2009)   Hypertension self-care plan printed.   Hypertension education handout printed    Lipid self-management support: Education handout, Resources for patients handout,  Written self-care plan  (07/11/2009)   Lipid self-care plan printed.   Lipid education handout printed      Resource handout printed.   Nursing Instructions: Request report of last Pap

## 2010-06-04 NOTE — Letter (Signed)
Summary: MeterDownLoad  MeterDownLoad   Imported By: Florinda Marker 07/15/2009 09:35:31  _____________________________________________________________________  External Attachment:    Type:   Image     Comment:   External Document

## 2010-06-04 NOTE — Initial Assessments (Signed)
INTERNAL MEDICINE ADMISSION HISTORY AND PHYSICAL  First Contact: Dr. Arnell Asal [phone] Second Contact: Dr. Bethel Born [phone] Attending: Dr. Doneen Poisson  PCP: Dr. Threasa Beards  CC: Nausea/vomiting in setting levofloxacin therapy  HPI: Bianca [name] is a 77 year old woman with pmh significant for DM II, CAD, HTN, HLD and latent TB s/p INH therapy who was recently seen in clinic 08/29 for evaluation of urinary symptoms and productive sputum. She was given a prescription for Levaquin, however culture data came back showed it was resistant, and it was changed to Augmentin. Pt also complained of subjective fevers and blood tinged productive sputum. It was thought that Levaquin would cover CAP. Pt returns to ED today accompanied by her son, for complaints of inability to urinate, nausea, vomiting, and poor oral intake. She has not been able to keep down orals including antibiotics. Her son provides history, and denies any cough or productive sputum today and one day prior. She has also not vomited for the past 10hours. Patient denies abdominal pain, dysuria, urgency, or changes in bowel habits.  ALLERGIES:NKDA  PAST MEDICAL HISTORY:  Diabetes mellitus, type II GERD Hepatitis B Hyperlipidemia Hypertension Cervical dysplasia Atrophic vaginitis Positive PPD in 2008 (Normal CXR) CAD (April 2008 with LIMA to the LAD, SVG to OM, SVG to distal right coronary artery and PDA.  She had repeat cath along with this demonstrating 99% stenosis at the anastomosis of the graft to the PDA.  She was managed medically), Spinal stenosis s/p decompression Latent TB s/p INH therapy (08/2007)   MEDICATIONS:  METFORMIN HCL 500 MG TABS (METFORMIN HCL) Take 2 tablets by mouth two times a day MULTIVITAMINS  TABS (MULTIPLE VITAMIN) Take 1 tablet by mouth once a day LANTUS 100 UNIT/ML SOLN (INSULIN GLARGINE) Inject 55 units subcutaneously at bedtime COZAAR 100 MG TABS (LOSARTAN POTASSIUM) Take 1 tablet by mouth  daily COREG 25 MG TABS (CARVEDILOL) Take 1 tablet by mouth two times a day PLAVIX 75 MG TABS (CLOPIDOGREL BISULFATE) Take 1 tablet by mouth once a day ASPIR-LOW 81 MG TBEC (ASPIRIN) Take 1 tablet by mouth once a day NITROGLYCERIN 0.4 MG  SUBL (NITROGLYCERIN) take 1 tablet under tongue every 5 min foe a maximum of 3 doses ONETOUCH ULTRASOFT LANCETS  MISC (LANCETS) Use as directed to check blood sugar at least twice a day ONETOUCH ULTRA TEST  STRP (GLUCOSE BLOOD) use as directed CRESTOR 20 MG TABS (ROSUVASTATIN CALCIUM) 1 by mouth daily DONATUSSIN 02-01-14-100 MG/5ML SYRP (PE-CPM-DM-GG) Take 10ml by mouth every 6 hours as needed for cough PEPCID 20 MG TABS (FAMOTIDINE) Take 1 tablet by mouth two times a day SB INSULIN SYRINGE 31G X 5/16" 1 ML MISC (INSULIN SYRINGE-NEEDLE U-100) use to inject lantus insulin one time a day- patient needs shortest  needles- 8mm or smaller LEVAQUIN 500 MG TABS (LEVOFLOXACIN) Take 1 tablet by mouth daily for 10 days PROMETHAZINE HCL 12.5 MG TABS (PROMETHAZINE HCL) Take 1 tablet by mouth every 4-6 hours as needed for nausea/vomiting AUGMENTIN 500-125 MG TABS (AMOXICILLIN-POT CLAVULANATE) Take 1 tablet by mouth twice a day for 7 days   SOCIAL HISTORY: Social History: She is a [place] immigrant She has been a housewife.   She is widowed and lives w/ her son. Tobacco - none Alcohol Use - no Illicit Drug Use - no   FAMILY HISTORY Noncontributory.   ROS: As per HPI  VITALS: T:98.1 Tmax 98.6  P: 67 BP:140/77  R: 16 O2SAT: 98% ON:RA  PHYSICAL EXAM: General:  alert, well-developed,  and cooperative to examination.   Head:  normocephalic and atraumatic.   Eyes:  vision grossly intact, pupils equal, pupils round, pupils reactive to light, no injection and anicteric.   Mouth:  pharynx pink and moist, no erythema, and no exudates.   Neck:  supple, full ROM, no thyromegaly, no JVD, and no carotid bruits.   Lungs:  normal respiratory effort, no accessory muscle use,  normal breath sounds, no crackles, and no wheezes.  Heart:  normal rate, regular rhythm, no murmur, no gallop, and no rub.   Abdomen:  soft, non-tender, normal bowel sounds, no distention, no guarding, no rebound tenderness, no hepatomegaly, and no splenomegaly.   Msk:  no joint swelling, no joint warmth, and no redness over joints.   Pulses:  2+ DP/PT pulses bilaterally Extremities:  No cyanosis, clubbing, edema  Neurologic:  no focal deficits   LABS:   Color, Urine                             YELLOW            YELLOW  Appearance                               CLOUDY     a      CLEAR  Specific Gravity                         1.015             1.005-1.030  pH                                       5.5               5.0-8.0  Urine Glucose                            250        a      NEG              mg/dL  Bilirubin                                NEGATIVE          NEG  Ketones                                  NEGATIVE          NEG              mg/dL  Blood                                    NEGATIVE          NEG  Protein                                  NEGATIVE          NEG  mg/dL  Urobilinogen                             0.2               0.0-1.0          mg/dL  Nitrite                                  POSITIVE   a      NEG  Leukocytes                               NEGATIVE          NEG   WBC / HPF                                0-2               <3               WBC/hpf  RBC / HPF                                0-2               <3               RBC/hpf  Bacteria / HPF                           MANY       a      RARE  Urine-Other                              SEE NOTE.    MUCOUS PRESENT  Sodium (NA)                              136               135-145          mEq/L  Potassium (K)                            4.0               3.5-5.1          mEq/L  Chloride                                 106               96-112           mEq/L  CO2                                       20                19-32  mEq/L  Glucose                                  201        h      70-99            mg/dL  BUN                                      18                6-23             mg/dL  Creatinine                               1.28       h      0.4-1.2          mg/dL  GFR, Est Non African American            41         l      >60              mL/min  GFR, Est African American                49         l      >60              mL/min    Oversized comment, see footnote  1  Calcium                                  9.5               8.4-10.5         mg/dL   WBC                                      8.8               4.0-10.5         K/uL  RBC                                      4.07              3.87-5.11        MIL/uL  Hemoglobin (HGB)                         12.2              12.0-15.0        g/dL  Hematocrit (HCT)                         36.2              36.0-46.0        %  MCV  88.9              78.0-100.0       fL  MCH -                                    30.0              26.0-34.0        pg  MCHC                                     33.7              30.0-36.0        g/dL  RDW                                      13.6              11.5-15.5        %  Platelet Count (PLT)                     153               150-400          K/uL  Neutrophils, %                           75                43-77            %  Lymphocytes, %                           17                12-46            %  Monocytes, %                             7                 3-12             %  Eosinophils, %                           2                 0-5              %  Basophils, %                             0                 0-1              %  Neutrophils, Absolute                    6.6               1.7-7.7  K/uL  Lymphocytes, Absolute                    1.5               0.7-4.0          K/uL  Monocytes, Absolute                      0.6                0.1-1.0          K/uL  Eosinophils, Absolute                    0.1               0.0-0.7          K/uL  Basophils, Absolute                      0.0               0.0-0.1          K/uL  UC&S from clinic Tests: (1) Culture, Urine ([id number]) ! COLONY COUNT:       RESULT: >=100,000 COLONIES/ML ! FINAL REPORT              ESCHERICHIA COLI     Confirmed Extended Spectrum Beta-Lactamase     Producer (ESBL)  Tests: (2) Sensitivity for: ESCHERICHIA COLI (ECOL) ! AMPICILLIN                R, >=32 ! AMPICILLIN/SUL            S, 8 ! IMIPENEM                  S, <=1 ! CEFAZOLIN                 R, >=64 ! CEFOXITIN                 R, 32 ! CEFTRIAXONE               R, >=64 ! CEFTAZIDIME               R ! CEFEPIME                  R ! AMIKACIN                  S, 16 ! GENTAMICIN                S, 4 ! TOBRAMYCIN                S, 2 ! CIPROFLOXACIN             R, >=4 ! LEVOFLOXACIN              R, >=8 ! NITROFURANTOIN            S, 32 ! TRIMETH/SULFA             R, >=320 Labs from clinic 08/29 Tests: (2) Microalbumin Creatinine Ratio (6100)   Microalbumin         [H]  14.03 mg/dL                 (9.14-7.82   Creatinine, Urine         41.3 mg/dL  Microalbumin/Creatinine Ratio                        [  H]  339.7 mg/g                  (0.0-30.0)  Tests: (3) CBC NO Diff (Complete Blood Count) (10000)   WBC                       7.8 K/uL                    (4.0-10.5)   RBC                       4.09 MIL/uL                 (3.87-5.11   Hemoglobin           [L]  11.9 g/dL                   (38.7-56.4   Hematocrit                36.8 %                      (36.0-46.0   MCV                       90.0 fL                     (78.0-100. ! MCH                       29.1 pg                     (26.0-34.0   MCHC                      32.3 g/dL                   (33.2-95.1   RDW                       13.5 %                      (11.5-15.5   Platelet Count            153 K/uL                     (150-400)   CXR 08/29 From Clinic  IMPRESSION:   Mild right lower lung opacity - suspect infection/pneumonia. Chest   x-ray follow-up to resolution is recommended.   ASSESSMENT AND PLAN:  1) Nausea/vomiting with urinary complaints - secondary to UTI. UC from 09/01 positive for E Coli, sensitive to augmentin. Pt however cannot tolerate by mouth meds, and it is also sensitive to Imipenem, thus will start imipenem, while in hospital. New culture data is pending. Her repeat UA in ED is nitrite positive, and leukocyte negative with no wbc per high power field. Lytes such as potassium are currently stable.   2) Productive sputum - Symptom onset x1 week. Hx of latent TB s/p INH tx x36m. Her CXR from 08/29, is not concerning for re-activated TB, and shows some haziness suggestive for right lower lobe infiltrate, suspicious for PNA. Will treat for CAP with Azithro and Imipenem (as it covers gm positive, gm negative, and anaerobes). Pt has no white count.   3) DM II -  Poorly controlled, A1C 10.6 08/29. Will start her on sliding scale in setting of poor oral intake. Can restart her lantus if her blood sugars remain high. Last microalb/cr ratio indicate she has significant proteinuria, and is on Losartan. In setting of nausea and vomiting, will not restart her ARB to prevent pre-renal azotemia.   4) Acute on chronic renal insufficiency - Baseline Cr 1.2. Last Cr obtained from clinic on 09/29, was 1.09. Today it is 1.28, just slightly above her baseline. This is likely pre-renal in setting nausea and vomiting. Will give fluids, NS at 125 cc/hr, hold Losartan while in hospital and check Cr in AM. Pt does not have hx of CHF, last Echo was done in 2008, which showed EF of 50-55% and normal systolic function.   5) CAD - s/p 5-vessel CABG in 2008, Hx of NSTEMI in April 2008 after CABG. Pt is followed by Dr. Antoine Poche of [facility]. Pt seen recently in his clinic, and he did not feel her chest pain was  cardiac in nature, and believe it was more pleuritic, related to her PNA. Will continue medical management with Coreg, Plavix and ASA.  6) HTN - Will resume Coreg, Hold Losarta, see above  7) HLD - Will resume Crestor, and check FLP.   8) VTE PROPH: lovenox  Attending Physician: I performed and/or observed a history and physical examination of the patient.  I discussed the case with the residents as noted and reviewed the residents' notes.  I agree with the findings and plan--please refer to the attending physician note for more details.  Signature  Printed Name

## 2010-06-10 NOTE — Progress Notes (Signed)
Summary: Refill/gh  Phone Note Refill Request Message from:  Fax from Pharmacy on May 29, 2010 12:17 PM  Refills Requested: Medication #1:  Levofloxacin 500 mg tablets 1 tablet by mouth every day for 10 days.   Last Refilled: 12/29/2009 Pt is having painful urination.  Cough.  Goes to the bathroom frequently.  No fever or chills.  Does not have any abdominal or back pain.  Daughter feels she needs.   Method Requested: Electronic Initial call taken by: Angelina Ok RN,  May 29, 2010 12:18 PM  Follow-up for Phone Call        Rx completed in Dr. Tiajuana Amass. Will give levaquin for 3 days. If no improvement, she needs to come in [facility] for further evaluation.  Follow-up by: Jackson Latino MD,  June 02, 2010 12:28 PM    New/Updated Medications: LEVAQUIN 500 MG TABS (LEVOFLOXACIN) Take 1 tablet by mouth once a day for 3 days Prescriptions: LEVAQUIN 500 MG TABS (LEVOFLOXACIN) Take 1 tablet by mouth once a day for 3 days  #3 x 0   Entered and Authorized by:   Jackson Latino MD   Signed by:   Jackson Latino MD on 06/02/2010   Method used:   Electronically to        [facility] [phone]* (retail)       [address]       [place], [place]  [address]       Ph: [phone] or [phone]       Fax: [phone]   RxID:   [id number]

## 2010-07-06 ENCOUNTER — Encounter: Payer: Self-pay | Admitting: Internal Medicine

## 2010-07-06 ENCOUNTER — Ambulatory Visit (INDEPENDENT_AMBULATORY_CARE_PROVIDER_SITE_OTHER): Payer: Self-pay | Admitting: Internal Medicine

## 2010-07-06 VITALS — BP 156/85 | HR 73 | Temp 97.1°F | Resp 20 | Ht 61.0 in | Wt 109.6 lb

## 2010-07-06 DIAGNOSIS — M1990 Unspecified osteoarthritis, unspecified site: Secondary | ICD-10-CM

## 2010-07-06 DIAGNOSIS — I2510 Atherosclerotic heart disease of native coronary artery without angina pectoris: Secondary | ICD-10-CM

## 2010-07-06 DIAGNOSIS — G4700 Insomnia, unspecified: Secondary | ICD-10-CM

## 2010-07-06 DIAGNOSIS — E119 Type 2 diabetes mellitus without complications: Secondary | ICD-10-CM

## 2010-07-06 DIAGNOSIS — I10 Essential (primary) hypertension: Secondary | ICD-10-CM

## 2010-07-06 LAB — BASIC METABOLIC PANEL
CO2: 26 mEq/L (ref 19–32)
Chloride: 104 mEq/L (ref 96–112)
Glucose, Bld: 144 mg/dL — ABNORMAL HIGH (ref 70–99)
Potassium: 4.3 mEq/L (ref 3.5–5.3)
Sodium: 140 mEq/L (ref 135–145)

## 2010-07-06 MED ORDER — ZOLPIDEM TARTRATE 5 MG PO TABS: 5.0000 mg | ORAL_TABLET | Freq: Every evening | ORAL | Status: DC | PRN

## 2010-07-06 MED ORDER — HYDROCODONE-ACETAMINOPHEN 5-500 MG PO TABS: 1.0000 | ORAL_TABLET | ORAL | Status: DC | PRN

## 2010-07-06 NOTE — Progress Notes (Signed)
  Subjective:    Patient ID: Bianca [name], female    DOB: [birth year], 77 y.o.   MRN: [id number]  HPI Pt is a 77 yo asian female with PMH of DM, HTN, CKD and OA who came here for regular f/u. She has been doing well, no CP, SOB, diarrhea or abdominal pain, dysuria. She had some poor sleep due to a lots stress from taking care of children and also had both knee pain from her OA, tramadol does not help too much. She has no other c/o.      Review of Systems  Constitutional: Negative for fever, chills, activity change, appetite change and fatigue.  HENT: Negative.   Eyes: Negative for pain.  Respiratory: Negative for apnea, cough, shortness of breath and wheezing.   Cardiovascular: Negative for chest pain, palpitations and leg swelling.  Gastrointestinal: Negative for nausea, abdominal pain, diarrhea, constipation and blood in stool.  Genitourinary: Negative for dysuria and hematuria.  Musculoskeletal: Positive for arthralgias.  Neurological: Negative for dizziness, seizures, syncope and light-headedness.       Objective:   Physical Exam  Constitutional: She is oriented to person, place, and time. She appears well-developed and well-nourished.  HENT:  Head: Normocephalic and atraumatic.  Eyes: Conjunctivae and EOM are normal. Pupils are equal, round, and reactive to light.  Neck: Normal range of motion. Neck supple.  Cardiovascular: Normal rate, regular rhythm, normal heart sounds and intact distal pulses.   Pulmonary/Chest: Effort normal and breath sounds normal.  Abdominal: Soft. Bowel sounds are normal.  Musculoskeletal: Normal range of motion. She exhibits tenderness.       Both knees have mild tenderness.   Neurological: She is alert and oriented to person, place, and time. She has normal reflexes.          Assessment & Plan:

## 2010-07-06 NOTE — Assessment & Plan Note (Addendum)
Her CBG has been well controlled, ranging 89-167.  Will check BMET for Cr, if eGFR still low, will decrease her metformin and increase her lantus to avoid lactic acidosis. Will have eye referral for annual exam.

## 2010-07-06 NOTE — Assessment & Plan Note (Signed)
No chest pain, will continue her current meds including plavix, ARB, and BB, control BP.

## 2010-07-06 NOTE — Assessment & Plan Note (Signed)
Her BP is above her goal and but at her baseline, will continue her current meds and check BMET. Also give her ambien for sleep, which may help her BP.

## 2010-07-06 NOTE — Assessment & Plan Note (Signed)
This is likely due to life stress from taking care of her grandchildren. Will give ambien and advised to decrease this stress if possible.

## 2010-07-06 NOTE — Patient Instructions (Signed)
Please check your blood pressure at least twice a week and bring your measurements back with you at next visit.  We will call you if any abnormal labs.

## 2010-07-06 NOTE — Assessment & Plan Note (Signed)
Her both knees still painful, and tramadol does not help too much, will change tramadol to vicodin. Also advised to use heating/icy pad.

## 2010-07-16 LAB — CBC
HCT: 31.7 % — ABNORMAL LOW (ref 36.0–46.0)
HCT: 35 % — ABNORMAL LOW (ref 36.0–46.0)
HCT: 35.5 % — ABNORMAL LOW (ref 36.0–46.0)
HCT: 36.2 % (ref 36.0–46.0)
MCHC: 32.4 g/dL (ref 30.0–36.0)
MCHC: 33.7 g/dL (ref 30.0–36.0)
MCV: 88.2 fL (ref 78.0–100.0)
MCV: 88.9 fL (ref 78.0–100.0)
MCV: 89.9 fL (ref 78.0–100.0)
RBC: 3.97 MIL/uL (ref 3.87–5.11)
RDW: 13.6 % (ref 11.5–15.5)
RDW: 13.7 % (ref 11.5–15.5)
RDW: 13.8 % (ref 11.5–15.5)
WBC: 4.5 10*3/uL (ref 4.0–10.5)
WBC: 6.1 10*3/uL (ref 4.0–10.5)
WBC: 8.8 10*3/uL (ref 4.0–10.5)

## 2010-07-16 LAB — BASIC METABOLIC PANEL
BUN: 13 mg/dL (ref 6–23)
BUN: 18 mg/dL (ref 6–23)
BUN: 6 mg/dL (ref 6–23)
CO2: 20 mEq/L (ref 19–32)
CO2: 24 mEq/L (ref 19–32)
Calcium: 8.4 mg/dL (ref 8.4–10.5)
Chloride: 106 mEq/L (ref 96–112)
Chloride: 109 mEq/L (ref 96–112)
Chloride: 113 mEq/L — ABNORMAL HIGH (ref 96–112)
GFR calc Af Amer: >60 mL/min (ref >60)
GFR calc non Af Amer: 55 mL/min — ABNORMAL LOW (ref >60)
Glucose, Bld: 121 mg/dL — ABNORMAL HIGH (ref 70–99)
Glucose, Bld: 201 mg/dL — ABNORMAL HIGH (ref 70–99)
Potassium: 3.3 mEq/L — ABNORMAL LOW (ref 3.5–5.1)
Potassium: 3.7 mEq/L (ref 3.5–5.1)
Potassium: 3.7 mEq/L (ref 3.5–5.1)
Potassium: 4 mEq/L (ref 3.5–5.1)
Sodium: 142 mEq/L (ref 135–145)
Sodium: 142 mEq/L (ref 135–145)

## 2010-07-16 LAB — GLUCOSE, CAPILLARY
Glucose-Capillary: 128 mg/dL — ABNORMAL HIGH (ref 70–99)
Glucose-Capillary: 173 mg/dL — ABNORMAL HIGH (ref 70–99)
Glucose-Capillary: 198 mg/dL — ABNORMAL HIGH (ref 70–99)
Glucose-Capillary: 298 mg/dL — ABNORMAL HIGH (ref 70–99)
Glucose-Capillary: 361 mg/dL — ABNORMAL HIGH (ref 70–99)
Glucose-Capillary: 390 mg/dL — ABNORMAL HIGH (ref 70–99)

## 2010-07-16 LAB — DIFFERENTIAL
Basophils Absolute: 0 10*3/uL (ref 0.0–0.1)
Basophils Absolute: 0 10*3/uL (ref 0.0–0.1)
Basophils Relative: 0 % (ref 0–1)
Eosinophils Relative: 2 % (ref 0–5)
Eosinophils Relative: 2 % (ref 0–5)
Lymphocytes Relative: 17 % (ref 12–46)
Monocytes Absolute: 0.6 10*3/uL (ref 0.1–1.0)
Monocytes Absolute: 0.6 10*3/uL (ref 0.1–1.0)

## 2010-07-16 LAB — LIPID PANEL
Cholesterol: 102 mg/dL (ref 0–200)
LDL Cholesterol: 22 mg/dL (ref 0–99)
Triglycerides: 218 mg/dL — ABNORMAL HIGH (ref <150)
VLDL: 44 mg/dL — ABNORMAL HIGH (ref 0–40)

## 2010-07-16 LAB — URINALYSIS, ROUTINE W REFLEX MICROSCOPIC
Bilirubin Urine: NEGATIVE
Glucose, UA: 250 mg/dL — AB
Ketones, ur: NEGATIVE mg/dL
Nitrite: POSITIVE — AB
pH: 5.5 (ref 5.0–8.0)

## 2010-07-16 LAB — LEGIONELLA ANTIGEN, URINE

## 2010-07-16 LAB — URINE CULTURE

## 2010-07-16 LAB — URINE MICROSCOPIC-ADD ON

## 2010-07-20 ENCOUNTER — Other Ambulatory Visit: Payer: Self-pay | Admitting: Internal Medicine

## 2010-08-06 LAB — GLUCOSE, CAPILLARY: Glucose-Capillary: 314 mg/dL — ABNORMAL HIGH (ref 70–99)

## 2010-08-09 LAB — CBC
HCT: 34.3 % — ABNORMAL LOW (ref 36.0–46.0)
Hemoglobin: 11.5 g/dL — ABNORMAL LOW (ref 12.0–15.0)
Platelets: 137 10*3/uL — ABNORMAL LOW (ref 150–400)
WBC: 4.9 10*3/uL (ref 4.0–10.5)

## 2010-08-09 LAB — GLUCOSE, CAPILLARY: Glucose-Capillary: 236 mg/dL — ABNORMAL HIGH (ref 70–99)

## 2010-08-09 LAB — DIFFERENTIAL
Eosinophils Relative: 2 % (ref 0–5)
Lymphocytes Relative: 16 % (ref 12–46)
Lymphs Abs: 0.8 10*3/uL (ref 0.7–4.0)
Monocytes Absolute: 0.5 10*3/uL (ref 0.1–1.0)

## 2010-08-12 ENCOUNTER — Encounter: Payer: Self-pay | Admitting: Internal Medicine

## 2010-08-18 LAB — GLUCOSE, CAPILLARY: Glucose-Capillary: 175 mg/dL — ABNORMAL HIGH (ref 70–99)

## 2010-08-28 ENCOUNTER — Other Ambulatory Visit: Payer: Self-pay | Admitting: *Deleted

## 2010-08-28 MED ORDER — FAMOTIDINE 20 MG PO TABS: 20.0000 mg | ORAL_TABLET | Freq: Two times a day (BID) | ORAL | Status: DC

## 2010-09-02 ENCOUNTER — Encounter: Payer: Self-pay | Admitting: Internal Medicine

## 2010-09-15 NOTE — H&P (Signed)
[name], [name]                  ACCOUNT NO.:  [id number]   MEDICAL RECORD NO.:  [id number]          PATIENT TYPE:  INP   LOCATION:  3709                         FACILITY:  [facility]   PHYSICIAN:  Rollene Rotunda, MD, FACCDATE OF BIRTH:  17-Feb-1931   DATE OF ADMISSION:  03/31/2008  DATE OF DISCHARGE:                              HISTORY & PHYSICAL   PRIMARY CARE PHYSICIAN:  Dr. Ernest Haber at the outpatient clinic.   PRIMARY CARDIOLOGIST:  Rollene Rotunda, MD, [facility].   CHIEF COMPLAINT:  Lower abdominal pain, chest pain.   HISTORY OF PRESENT ILLNESS:  The patient is a 77 year old female who is  non-English speaking with past medical history of coronary artery  disease status post CABG in April 2008 and NSTEMI post CABG,  hypertension, hyperlipidemia, and poorly controlled insulin-dependent  diabetes mellitus who presented to the emergency room complaining of  several hours of lower abdominal pain that radiated to her chest.  It is  worse with exertion and associated with nausea, global weakness,  shortness of breath, and dizziness.  She says it feels different than  her previous chest pain when she had symptoms of angina and previous MI.  She also continues to have a nonproductive cough that has been ongoing  for a year and is presently being treated with Levaquin in the  outpatient setting.  She notes that she has recently been walking around  for exercise and has not had any exertional shortness of breath or chest  pain.  She denies headache, fevers, and diarrhea.  She also notes  increased urinary frequency last week and dysuria for the last 3 to 4  days.  Her symptoms have currently resolved at this time, but has been  recurring intermittently for the last 6-7 hours.   PAST MEDICAL HISTORY:  1. Coronary artery disease with      a.     CABG with LIMA to LAD.      b.     Saphenous vein graft to OM.      c.     Saphenous vein graft to distal RCA with  anastomosis to PDA.  2. Repeat cath with patent graft except for 99% stenosis and      anastomosis of graft PDA.  3. NSTEMI in May 2008.  4. Ischemic cardiomyopathy with EF of 40% with repeat echo showing EF      50-55% in May 2008.  5. Dyslipidemia.  6. Hypertension.  7. Insulin-dependent diabetes with A1c this month 10.3  8. Spinal stenosis.  9. GERD.  10.Hepatis B.  11.Positive PPD in the past.  12.Right humerus fracture.   HOME MEDICATIONS:  1. Glipizide 10 mg p.o. b.i.d.  2. Protonix 40 mg p.o. daily.  3. Plavix 75 mg p.o. daily.  4. Aspirin 81 mg p.o. daily.  5. Coreg 12.5 mg p.o. b.i.d.  6. Nitroglycerin p.r.n.  7. Zocor 40 mg p.o. daily.  8. Lantus 40 units nightly.  9. Cozaar 50 mg p.o. daily.  10.Levaquin 500 mg p.o. daily.   ALLERGIES:  No known drug allergies.  SOCIAL HISTORY:  She lives in [place] with her daughter and she has 9  children in total.  She denies any tobacco, alcohol, or any illicit drug  use.  She is active and walks multiple times a week.   FAMILY HISTORY:  She does not know parents' medical history.  Her  siblings are healthy.   REVIEW OF SYSTEMS:  Significant for lower abdominal pain, nausea, chest  pain, shortness of breath, dry cough, vertigo, increased urinary  frequency, dysuria, and global weakness.  All other systems reviewed and  were negative.   PHYSICAL EXAMINATION:  VITALS:  Temperature 98.0, blood pressure 178/99  rechecked at 132/72, heart rate 71, respiratory rate 16, and O2 sat 99%  on room air.  GENERAL:  She is alert, oriented, and no distress.  HEENT:  Sclerae were anicteric bilaterally, arcus senilis bilaterally,  PERRLA, EOMI, moist mucous membranes, multiple teeth missing.  NECK:  Supple without lymphadenopathy, thyromegaly, carotid bruit, JVD.  CARDIOVASCULAR:  Normal S1, S2.  Regular rate and rhythm without  murmurs, rubs, or gallops.  Carotid, radial, femoral, popliteal, and  dorsalis pedis pulses were 2+  bilaterally.  LUNGS:  Clear to auscultation bilaterally with normal respiratory  effort.  ABDOMEN:  Good bowel sounds, soft, nontender, and nondistended.  No  hepatosplenomegaly noted.  EXTREMITIES:  A 1+ pitting edema bilateral lower extremities.  NEUROLOGIC:  Alert and oriented x3.  Cranial II through XII were intact.   DIAGNOSTIC IMAGING:  Portable chest x-ray without an acute process.   EKG shows a normal sinus rhythm with heart rate of 74, normal axis,  normal intervals  with question of left atrial enlargement.  Q-waves  noted inferiorly.  Some mild T-wave flattening in V2 and no T-wave  inversion, ST segment depression, or ST segment elevation.   ADMISSION LABORATORY:  ISTAT; sodium 137, potassium 3.8, chloride 107,  bicarb 22, BUN 13, creatinine 1.1, and glucose 213.  Hemoglobin 12.9.  D-  dimer 0.701.  CK-MB 1.0.  Troponin less than 0.05 and myoglobin 32.   ASSESSMENT AND PLAN:  The patient is a 77 year old female with;  1. Abdominal/chest pain.  Acute coronary syndrome is a possibility      especially given her history of diabetes and known coronary artery      disease.  We will cycle cardiac enzymes x3, repeat EKG in the      morning, and monitor on telemetry.  Also consider other etiologies      because this is quite atypical for ischemic pain and the patient      notes that the pain is markedly different than her previous angina.      We will check a urinalysis and urine culture.  Also check lipase      and LFTs.  Consider aortic pathology in the differential, but the      patient has very good pulses all over and history is not consistent      with a dissection.  I do not feel like any additional imaging is      indicated at this time.  For now, we will continue her home      medications to include aspirin, Plavix, Coreg, and statin therapy.      We will continue on nitroglycerin drip for now since that has been      started.  She is pain free presently, I do not think  she needs a      heparin drip at this point.  2.  Hypertension.  We will restart her medications.  3. Hyperlipidemia.  We will check fasting lipid panel in the morning      and adjust statin therapy as needed for goal LDL less than 70.  4. Diabetes mellitus, insulin dependent.  Hemoglobin A1c recently      checked this month and elevated at 10.3.  We will start slightly      lower dose of Lantus and add sliding scale insulin to her home      regimen.      Joaquin Courts, MD  Electronically Signed      Rollene Rotunda, MD, [facility]  Electronically Signed    VW/MEDQ  D:  04/01/2008  T:  04/01/2008  Job:  [id number]

## 2010-09-15 NOTE — Discharge Summary (Signed)
NAMELARUEN, RISSER                  ACCOUNT NO.:  [id number]   MEDICAL RECORD NO.:  [id number]          PATIENT TYPE:  INP   LOCATION:  2033                         FACILITY:  [facility]   PHYSICIAN:  Rollene Rotunda, MD, FACCDATE OF BIRTH:  05/17/1930   DATE OF ADMISSION:  09/04/2006  DATE OF DISCHARGE:  09/08/2006                         DISCHARGE SUMMARY - REFERRING   DISCHARGE DIAGNOSES:  1. Non-ST segment elevated myocardial infarction.  2. Progressive coronary artery disease with occlusion of anastomotic      saphenous vein graft to the posterior descending artery.  3. Hyperglycemia with a history of diabetes and elevated hemoglobin      A1c.  4. Normocytic anemia.  5. Hyperlipidemia.  6. Non-English speaking.  7. Cellulitis.  8. History as noted below.   PROCEDURES PERFORMED:  Cardiac catheterization on Sep 06, 2006, by Dr.  Samule Ohm.   SUMMARY OF HISTORY:  [name] is a 77 year old female who underwent  five-vessel bypass surgery on August 17, 2006, by Dr. Donata Clay.  She was  awakened suddenly at 2:00 a.m. from sleep with chest discomfort  associated with shortness of breath and diaphoresis.  She gave it a 7 on  a scale of 0-10.  She has done well since her bypass surgery until the  day of admission.  She felt the discomfort was similar to her symptoms  prior to her bypass surgery when she had a non-ST segment elevated  myocardial infarction.  She also notes some slight cellulitis at her  vein harvest site, that she has been taking antibiotics.  The patient  does not speak [place], her daughter provide the majority of  translation.   PAST MEDICAL HISTORY:  1. Also notable for diabetes.  2. Hypertension.  3. Hyperlipidemia.  4. Status post laminectomy.   LABORATORY DATA:  Admission chest x-ray on Sep 04, 2006, showed  borderline cardiomegaly without edema, stable mid indistinctness of the  left hemidiaphragm suggesting atelectasis or possibly a small left  pleural  effusion.   Echocardiogram on Sep 05, 2006, revealed moderate hypokinesis of the mid  distal inferior septum, mild hypokinesis of the distal inferior wall,  overall ejection fraction was between 50-55%, mild increased aortic  valve thickness.  Discharge, weight was 49.5 kg.   Admission H&H was 10.5 and 31.3, normal indices, platelets 14.4, WBC 6.  On Sep 07, 2006, H&H was 9.8 and 29.2, normal indices, platelets 214,  WBCs 5.  Admission PTT 34, PT 13.8.  Admission sodium was 135, potassium  4.5, BUN 26, creatinine 1.18, glucose 267.  Alkaline phosphatase, AST  and ALT were slightly elevated at 131, 201 and 83, respectively.  On Sep 07, 2006, sodium was 135, potassium 4.7, BUN 10, creatinine 0.89,  glucose 163.  Hemoglobin A1c on admission was 7.7.  CK, MBs, relative  indexes were within normal limits x4.  Admission troponin was 1.8,  subsequent troponins x2 were declining.  Fasting lipids showed a total  cholesterol 139, triglycerides 181, HDL 31, LDL 72.   EKGs showed normal sinus rhythm, normal axis, evolving inferior lateral  myocardial infarction.  EKG  showed diffuse ST-segment elevation  inferiorly and anterolaterally, Q-waves inferiorly with slight ST-  segment elevation inferiorly.  It was felt that these changes were not  new compared to her recent admission.   HOSPITAL COURSE:  Ms. Siciliano was admitted to the unit 2000.  She was  placed on heparin and Integrilin.  Her initial chest discomfort was  relieved, however, she had a residual chest discomfort that was painful  to the touch.  On comparison, her old EKGs did not show any new changes.  She was initially admitted to the CCU for further evaluation..  Her  Keflex was continued for her cellulitis.  The patient had improved  symptomatically with beta blockers, nitroglycerin and Reglan was  discontinued.  Plavix was added given it was felt that she had a non-ST  segment elevated myocardial infarction.  Catheterization was felt  to be  necessary, given her presentation echocardiograms performed.  Catheterization was performed on Sep 06, 2006 by Dr. Samule Ohm.  This  revealed native three-vessel coronary artery disease.  The LIMA to the  LAD was patent, the saphenous vein graft to the OM1 was patent, the  saphenous vein graft to the distal RCA was patent.  The anastomosis to  the PDA had a long 99% lesion with TIMI-1 flow.  Dr. Samule Ohm reviewed  this and felt that she should continue medical treatment..  She had some  shoulder discomfort that was relieved with Percocet.  Medications  continued to be adjusted.  Findings were also reviewed by Dr. Gala Romney  who agreed with plan.  She was noted to be hyperglycemia post her  catheterization.  Her glipizide was resumed.  Cardiac rehab assisted  with education and ambulation.  By Sep 07, 2006, she was doing much  better.  By Sep 08, 2006, Dr. Antoine Poche felt that she could be discharged  home.  Prior to her discharge, he asked for a CBC and a BMET to be  ordered. It is noted at the time of this dictation that those labs are  pending.   DISPOSITION:  Prior to discharge, her CBC and BMET will be called to  myself for approval of final discharge.  She was asked to maintain a low-  sodium, heart-healthy, ADA diet.  Wound care and activities are per  supplemental sheet and post bypass instructions.   MEDICATION CHANGES:  1. A new prescription for Keflex 500 mg daily for 4 days.  2. Plavix 75 mg daily.  3. Coreg 12.5 mg b.i.d.  4. Nitroglycerin 0.4 p.r.n.  5. Lisinopril 20 mg daily.  6. Her aspirin was increased to 325 mg daily.  7. She was asked to continue her Lipitor 40 mg nightly.  8. Protonix 40 mg daily.  9. Glipizide 10 mg b.i.d.  10.Multivitamin daily.  She was asked to bring all medications to all appointments.  She was  asked to discontinue her Lopressor, Cozaar and Lasix.  She will follow up with Dr. Antoine Poche on Sep 20, 2006, at 10:45 a.m.  She  was also asked  to call Dr. Harvie Junior to arrange a two-week follow-up  appointment in regards to her history of diabetes.  She will follow up  with CVTS as dictated.   DISCHARGE TIME:  45 minutes.      Joellyn Rued, PA-C      Rollene Rotunda, MD, [facility]  Electronically Signed    EW/MEDQ  D:  09/08/2006  T:  09/08/2006  Job:  [id number]   cc:   Steele Berg. Phifer,  M.D.  Kerin Perna, M.D.

## 2010-09-15 NOTE — Cardiovascular Report (Signed)
NAMEDEAJA, RIZO                  ACCOUNT NO.:  [id number]   MEDICAL RECORD NO.:  [id number]          PATIENT TYPE:  INP   LOCATION:  2033                         FACILITY:  [facility]   PHYSICIAN:  Salvadore Farber, MD  DATE OF BIRTH:  05/17/1930   DATE OF PROCEDURE:  09/06/2006  DATE OF DISCHARGE:                            CARDIAC CATHETERIZATION   PROCEDURE:  Left heart catheterization, left ventriculography, coronary  bypass graft angiography.   INDICATIONS:  [name] is a 77 year old woman with atherosclerotic  coronary disease for which she is status post recent coronary artery  bypass grafting surgery.  She now presents with non-ST elevation  myocardial infarction with troponin rising to 1.5.  She is referred for  diagnostic angiography.   PROCEDURAL TECHNIQUE:  Informed consent was obtained.  Under 1%  lidocaine local anesthesia, a 5-French sheath was placed in the right  common femoral artery using modified Seldinger technique.  Diagnostic  angiography and ventriculography were performed using JL4, JR4, and  pigtail catheters.  Angiography of the grafts was performed using JR4  catheter for each of the veins and a LIMA catheter for the left internal  mammary artery.  The mammary was not selectively engaged due to  tortuosity in the subclavian artery, but the LIMA and LAD were well  visualized from a nonselective injection.   The patient was then transferred to the holding room in stable condition  having tolerated the procedure well.   COMPLICATIONS:  None.   FINDINGS:  1. EF 40% with inferior and inferoapical hypokinesis.  2. No aortic stenosis or mitral regurgitation.  3. Left main:  Angiographically normal.  4. LAD:  Moderate-sized vessel giving rise to a single diagonal      branch.  The LAD has an 80% stenosis proximally and a second 80%      stenosis in the midvessel.  The LIMA is anastomosed to the distal      LAD were it has good runoff.  The vein graft to  the diagonal is      occluded.  There is a 90% stenosis in the diagonal.  There is TIMI      I flow in the diagonal itself.  5. Circumflex:  Moderate-sized vessel giving rise to one large      marginal.  This marginal has a proximal 80% stenosis.  The vein      graft to the marginal is widely patent with good runoff.  6. RCA:  The vessel is occluded proximally.  There is a Y-graft to the      distal RCA and the PDA.  The portion of the graft to the distal RCA      is widely patent with excellent distal runoff.  The Y portion      supplying the most distal portion of the PDA, beyond the 95%      stenosis in the mid PDA, is full of thrombus with approximately 99%      stenosis.   IMPRESSION/PLAN:  Occlusion of the vein graft to diagonal and subtotal  occlusion of the portion of  the Y-graft supplying the right posterior  descending artery.  Given the TIMI I flow to the diagonal, this may be  the acute lesion.  However, the inferior hypokinesis argues that the posterior descending  artery is the acute lesion.  In either case, she has had hemoptysis and  no postinfarct angina.  I, therefore, recommend medical therapy while  the cause of her hemoptysis is evaluated.      Salvadore Farber, MD  Electronically Signed     WED/MEDQ  D:  09/08/2006  T:  09/08/2006  Job:  [id number]

## 2010-09-15 NOTE — Assessment & Plan Note (Signed)
[facility]                            CARDIOLOGY OFFICE NOTE   NAME:App, Bianca [name]                         MRN:          [id number]  DATE:03/01/2008                            DOB:          05/17/1930    PRIMARY CARE PHYSICIAN:  [facility],  Dr. Elvera Lennox   REASON FOR PRESENTATION:  Evaluate the patient with coronary artery  disease.   HISTORY OF PRESENT ILLNESS:  The patient returns for yearly followup.  Since I last saw her she has had no new cardiovascular problems.  However, she has been complaining of a cough.  Turns out that this has  been particularly problematic.  It is causing her sometimes to have  difficulty sleeping.  It is a nonproductive cough.  It has been fairly  persistent.  She is not describing any shortness of breath.  She has not  been having palpitation, syncope or syncope.  She has not been having  any chest discomfort, neck or arm discomfort.  Previous incisional chest  discomfort seems to have completely healed.  She is doing some walking,  but not as routinely as I would hope.   PAST MEDICAL HISTORY:  Coronary artery disease (status post CABG with a  LIMA to the LAD, SVG to OM, SVG to distal right coronary artery with  anastomosed to the PDA.  Following, the patient did have a  catheterization demonstrating patent grafts except for 99% stenosis with  TIMI 1 flow at the anastomosis of the graft to the PDA.  This was  managed medically.), mildly reduced ejection fraction (40% with  inferoapical hypokinesis), dyslipidemia, diabetes mellitus,  hypertension, spinal stenosis, gastroesophageal reflux disease,  hepatitis B, lumbar spine compression, apparent positive PPD in the  past.   ALLERGIES:  None.   MEDICATIONS:  1. Glipizide 10 mg b.i.d.  2. Multivitamin.  3. Lantus.  4. Cozaar 25 mg daily.  5. Coreg 12.5 b.i.d.  6. Plavix 75 mg daily.  7. Aspirin 81 mg daily.  8. Lisinopril 20 mg daily.  9. Vitamin  B6.  10.Simvastatin 40 mg daily.   REVIEW OF SYSTEMS:  As stated in the HPI and otherwise negative other  systems.   PHYSICAL EXAMINATION:  GENERAL:  The patient is in no distress.  VITAL SIGNS:  Blood pressure 141/90, heart rate 79 and regular, weight  118 pounds, body mass index 23.  HEENT:  Eyelids are unremarkable.  Pupils are equal, round and reactive  to light.  Fundi not visualized.  Oral mucosa unremarkable.  NECK:  No jugular venous distention at 45 degrees.  Carotid upstroke  brisk and symmetric.  No bruits.  No thyromegaly.  LYMPHATICS:  No cervical, axillary or inguinal adenopathy.  LUNGS:  Clear to auscultation bilaterally.  BACK:  No costovertebral angle tenderness.  CHEST:  Unremarkable.  HEART:  PMI not displaced or sustained.  S1 and S2 within normal.  No  S3, no S4, no clicks, no rubs, no murmurs.  ABDOMEN:  Flat, positive bowel sounds normal in frequency and pitch.  No  bruits, no rebound, no guarding or  midline pulsatile mass, no  organomegaly.  SKIN:  No rashes, no nodules.  EXTREMITIES:  2+ pulse, no edema.   EKG sinus rhythm, old inferior infarct, no acute ST-T wave changes.   ASSESSMENT AND PLAN:  1. Coronary artery disease.  The patient is having no new symptoms.      No further cardiovascular testing is suggested.  She should      continue with risk reduction.  2. Dyslipidemia.  I reviewed her labs.  Her HDL is a little bit low.      I have encouraged to increase walking.  She might need combinations      therapy, but I will defer her to primary care doctor.  The goal      should be an LDL less than 70 and HDL greater than 50.  3. Cough.  This may well be related to the ACE inhibitor.  I have      taken the liberty of discontinuing the lisinopril.  I have doubled      her Cozaar.  Because of this, I will be seeing her back in a couple      months.  4. Hypertension.  It has been managed in the context of treating her      coronary artery disease and  with the changes as above.  5. Diabetes, per her primary care physician.  6. Followup.  I will see her back in 2 months and then hopefully      yearly thereafter.     Rollene Rotunda, MD, [facility]  Electronically Signed    JH/MedQ  DD: 03/01/2008  DT: 03/02/2008  Job #: [id number]

## 2010-09-15 NOTE — Assessment & Plan Note (Signed)
[facility]                            CARDIOLOGY OFFICE NOTE   NAME:Bianca [name], Bianca [name]                         MRN:          [id number]  DATE:10/17/2006                            DOB:          05/17/1930    PRIMARY CARE PHYSICIAN:  [facility].   REASON FOR PRESENTATION:  Patient is status post a coronary artery  bypass graft surgery.   HISTORY OF PRESENT ILLNESS:  The patient is a 77 year old who presents  for followup of coronary artery disease as described below.  She has had  some mild incisional chest discomfort; however, she has been walking and  doing some cooking.  She is not able to bring on the same kind of  symptoms that she had prior to her bypass.  She is not describing any  substernal chest pressure, neck or arm discomfort.  She is not having  any palpitations, pre-syncope or syncope.  She is not having any PND or  orthopnea.  She has had a cough, which she was having before her bypass.  She did get a chest x-ray on October 13, 2006, ordered by her primary care  physician apparently.  This demonstrated no acute findings, with normal  postoperative change.  There was no mention of any effusion.  She has  not seen Dr. Kathlee Nations Trigt.  She is taking medicines as described  below.  She is not having any palpitations, pre-syncope or syncope.  She  is having no PND or orthopnea.  She is having no fevers or chills.  She  has a good appetite.   PAST MEDICAL HISTORY:  1. Coronary artery disease, status post coronary artery bypass graft      surgery with a LIMA to the LAD, saphenous vein graft to OM-I,      saphenous vein graft to distal right coronary artery with      anastomosis to the PDA.  The last catheterization demonstrated all      the grafts to be patent, except for a 99% stenosis with TIMI-I flow      at the anastomosis of the PDA.  This was managed medically.      (Mildly reduced ejection fraction of  40%, with inferior and      inferoapical hypokinesis.)  2. Dyslipidemia.  3. Diabetes.  4. Hypertension.  5. Spinal stenosis.  6. Gastroesophageal reflux disease.  7. Hepatitis-B.  8. Lumbar spine decompression.   ALLERGIES:  No known drug allergies.   MEDICAL DECISION MAKING:  1. Glipizide 10 mg b.i.d.  2. Multivitamin.  3. Protonix 40 mg daily.  4. Lipitor 40 mg daily.  5. Lantus.  6. Cozaar 25 mg daily.  7. Coreg 12.5 mg b.i.d.  8. Plavix 75 mg daily.  9. Aspirin 81 mg daily.   REVIEW OF SYSTEMS:  As stated in the HPI and otherwise negative for  other systems.   PHYSICAL EXAMINATION:  GENERAL:  The patient is in no distress.  VITAL SIGNS:  Blood pressure 153/91, heart rate 88 and regular, weight  110 pounds, body mass  index 22.  HEENT:  Eyes unremarkable.  Pupils equal, round, reactive to light.  Fundi not visualized.  Oral mucosa not remarkable.  NECK:  No jugular venous distention at 45 degrees.  Carotid upstrokes  brisk and symmetric.  No bruits, no thyromegaly.  LYMPHATICS:  No cervical, axillary or inguinal adenopathy.  LUNGS:  Clear to auscultation bilaterally.  BACK:  No costovertebral angle tenderness.  CHEST:  A well-healed sternotomy scar with a small remnant of suture  protruding from the cephalad area of the scar.  HEART:  PMI not displaced or sustained.  S1 and S2 within normal limits.  No S3, no S4, no clicks, no rubs, no murmurs.  ABDOMEN:  Flat, positive bowel sounds, normal in frequency and pitch.  No bruits, no rebound, no guarding, no midline pulsatile mass, no  organomegaly, no splenomegaly.  SKIN:  No rashes, no nodules.  EXTREMITIES:  With 2+ pulses throughout.  No clubbing, cyanosis or  edema.  NEUROLOGIC:  Grossly intact throughout.   IMPRESSION:  1. Coronary artery disease:  The patient is doing well with respect to      this.  She is going to continue secondary risk reduction.  No      further cardiovascular testing is suggested.  She is  encouraged to      continue walking.  2. Sternal wound suture:  I extracted this.  There was no erythema or      exudate.  This was bandaged and she was given instructions on care.      I do not suspect this will be a problem.  3. Ischemic cardiomyopathy:  I will titrate her medications over time.      She will continue the current regimen for now.  4. Dyslipidemia:  Will follow this over time.  5. Diabetes:  She is encouraged to follow with her primary care      physician, as they will manage this.  6. Hypertension:  Her blood pressure is slightly elevated but she has      just started on the Cozaar.  This is a drug we will titrate upward      for her ischemic cardiomyopathy as well, but I will not make any      changes, as this was just started.   FOLLOWUP:  She should come back in about one month or sooner if needed.     Rollene Rotunda, MD, [facility]  Electronically Signed    JH/MedQ  DD: 10/17/2006  DT: 10/18/2006  Job #: [id number]   cc:   [facility]

## 2010-09-15 NOTE — Assessment & Plan Note (Signed)
[facility]                            CARDIOLOGY OFFICE NOTE   NAME:Bianca [name], Bianca [name]                         MRN:          [id number]  DATE:02/20/2007                            DOB:          05/17/1930    PRIMARY CARE PHYSICIAN:  [facility].   REASON FOR PROCEDURE:  Evaluate the patient with coronary disease status  post CABG.   HISTORY OF PRESENT ILLNESS:  The patient returns for follow up.  She is  still getting some sternal chest discomfort that is related to the  bypass incision.  However, this was mild.  She is not getting any of the  chest discomfort that was previous angina.  She is not having any  shortness of breath, PND or orthopnea.  She walks outside when the  weather is nice.  She is followed according to her granddaughter at  [facility] and has had her medications adjusted for diabetes control.  She thinks her cholesterol has been checked as well.   PAST MEDICAL HISTORY:  1. Coronary artery disease (status post CABG with LIMA to the LAD, SVG      to OM1, SVG to distal right coronary artery with anastomosis to the      PDA.  Following this demonstrated patent grafts.  There was a 99%      stenosis with TIMI I flow at the anastomosis with PDA.  This was      managed medically).  Mildly reduced ejection fraction (40% with      inferoapical hypokinesis).  2. Dyslipidemia.  3. Diabetes mellitus.  4. Hypertension.  5. Spinal stenosis.  6. Gastroesophageal reflux disease.  7. Hepatitis B.  8. Lumbar spine compression.  9. Apparent positive PPD (I do not have a report of this, although,      she is on active treatment.).   ALLERGIES:  None.   CURRENT MEDICATIONS:  1. Glipizide 10 mg b.i.d.  2. Multivitamin.  3. Protonix 40 mg daily.  4. Lipitor 40 mg daily.  5. Lantus.  6. Cozaar 25 mg daily.  7. Coreg 12.5 mg b.i.d.  8. Aspirin 81 mg daily.  9. Isoniazid 300 mg daily.  10.Lisinopril 20 mg daily.  11.Vitamin  B6.   REVIEW OF SYSTEMS:  As stated in the HPI and otherwise negative for  other systems.   PHYSICAL EXAMINATION:  GENERAL:  The patient is in no distress.  VITAL SIGNS:  Blood pressure 138/76, heart rate 70 and regular, weight  108 pounds, body mass index 22.  HEENT:  Eyes unremarkable.  Pupils equal, round and reactive to light.  Fundi not visualized.  Oral mucosa unremarkable.  NECK:  No jugular venous distention at 45 degrees.  Carotid upstrokes  brisk and symmetric.  No bruits or thyromegaly.  LYMPHATICS.  No adenopathy.  LUNGS:  Clear to auscultation bilaterally.  CHEST:  Well-healed sternotomy scar.  HEART:  PMI not displaced or sustained.  S1, S2 within normal limits.  No S3, S4.  No clicks, rubs, murmurs.  ABDOMEN:  Flat, positive bowel sounds.  Normal in frequency  and pitch.  No bruits, rebound, guarding.  No midline pulsatile mass.  No  hepatomegaly.  No splenomegaly.  SKIN:  No rashes, no nodules.  EXTREMITIES:  Pulses 2+ without edema.   ASSESSMENT/PLAN:  1. Coronary Disease: The patient is doing well except for this.  No      further cardiovascular testing is suggested.  She needs aggressive      primary risk reduction, and I did review this at length with her      granddaughter.  They understand and agree to comply with followup      at [facility].  I have instructed her on an exercise regimen too,      and thought maybe getting a bicycle in the house would be the most      prudent for her activity.  2. Hypertension.  She is on medications as listed for control of blood      pressure as well as risk reduction and because of her diabetes.      However, she was coughing.  It was thought that it might have been      the Lisinopril, so she was to substitute Cozaar.  However, they      were confused and did not stop the Cozaar.  She has been taking      both.  I told her that she could stop the Cozaar as the cough is      very mild.  This decision could be reversed in  the future, and she      could take the ARB if her primary care doctor feels that ACE is      indeed contributing to problematic cough.   Of note, I did get a request from the dentist to discontinue the Plavix  prior to the procedure that she is to have.  I did give the okay for  this.   FOLLOWUP:  She will come back to this clinic in one year, sooner if  needed.     Rollene Rotunda, MD, [facility]  Electronically Signed    JH/MedQ  DD: 02/20/2007  DT: 02/21/2007  Job #: [id number]

## 2010-09-18 NOTE — Cardiovascular Report (Signed)
NAMEAKYA, FIORELLO NO.:  [id number]   MEDICAL RECORD NO.:  [id number]          PATIENT TYPE:  INP   LOCATION:  6531                         FACILITY:  [facility]   PHYSICIAN:  Jonelle Sidle, MD DATE OF BIRTH:  Sep 04, 1930   DATE OF PROCEDURE:  08/16/2006  DATE OF DISCHARGE:                            CARDIAC CATHETERIZATION   CARDIOLOGIST:  Rollene Rotunda, MD, [facility]   INDICATIONS:  Ms. Fazekas is a 77 year old [place] woman with a history  of type 2 diabetes mellitus, hypertension, hyperlipidemia,  gastroesophageal reflux disease and hepatitis B.  She has a 2 to 63-month  history of chest discomfort that has increased in frequency and  intensity of late.  She is admitted to the hospital now on the teaching  service, and her cardiac markers were abnormal with a troponin I level  of 0.24.  She was seen by Dr. Antoine Poche in consultation, and a diagnostic  cardiac catheterization was recommended.  I discussed this with the  patient in the cardiac catheterization lab through the use of a language  interpreter, and she is in agreement to proceed.  Informed consent was  obtained.   PROCEDURE PERFORMED:  Selective coronary angiography.   ACCESS AND EQUIPMENT:  The area about the right femoral artery was  anesthetized with 1% lidocaine, and a 5-French sheath was placed in the  right femoral artery via the modified Seldinger technique.  Standard  preformed 5-French JL-4 and JR-4 catheters were used for selective  coronary angiography.  Left ventriculography was not requested given the  fact that the patient had some mild baseline renal insufficiency, and an  echocardiogram already demonstrated preserved ventricular systolic  function. A total of 50 mL Omnipaque were used.  The patient tolerated  the procedure well without immediate complications.   HEMODYNAMIC RESULTS:  Aorta 155/74 mmHg.   ANGIOGRAPHIC FINDINGS:  1. The left main coronary artery gives rise to the  left anterior      descending and circumflex vessels.  This is a relatively smooth-      caliber vessel with approximately 20% distal tapering.  2. The left anterior descending is a small-caliber vessel with      proximal and distal diagonal branches.  There is calcification      noted in the proximal portion of the vessel.  There is a diffuse      area of 80% stenosis in the mid left anterior descending around the      origin of the first diagonal branch which itself has a 50% mid      vessel stenosis.  More distally in the left anterior descending is      noted a 75% stenosis.  These lesions did not improve significantly      following intracoronary nitroglycerin.  3. The circumflex vessel is medium in caliber.  There are four obtuse      marginal branches, the third of which is the largest.  Luminal      irregularities are noted within the circumflex proper,  no more      than approximately 20%.  There is a very small first obtuse      marginal that has a 75% area of diffuse stenosis.  The large third      obtuse marginal branch also has a 75% stenosis in the mid portion.  4. The right coronary artery is a dominant, medium-caliber vessel with      large posterior descending branch.  There is a focal 99% stenosis      in the proximal vessel followed by 30% mid vessel stenosis as well      as a 70% stenosis within the posterior descending.  These lesions      did not improve significantly following intracoronary      nitroglycerin.   Ventriculography was not performed.   DIAGNOSES:  1. Multivessel coronary artery disease as outlined above including a      99% proximal right coronary artery stenosis, 70% posterior      descending stenosis, 80% mid left anterior descending stenosis, and      75% obtuse marginal stenosis.  The vessels are relatively small in      caliber in the setting of diabetes mellitus, and there was no      significant improvement following intracoronary  nitroglycerin.  2,  Left ventriculography was not performed given some baseline degree  of renal insufficiency and the fact an echocardiogram and already been  obtained.   DISCUSSION:  I reviewed the films today with Dr. Juanda Chance.  I also spoke  with the patient through an interpreter and with the patient's family  about the results.  Given the relatively small caliber vessels in the  setting of diabetes, multivessel percutaneous intervention would carry a  very high restenosis risk.  Coronary artery bypass grafting should be  discussed, and we will ask for a CVTS consultation to review the films  and discuss options with the patient and her family.      Jonelle Sidle, MD  Electronically Signed     SGM/MEDQ  D:  08/16/2006  T:  08/16/2006  Job:  [id number]   cc:   Rollene Rotunda, MD, [facility]

## 2010-09-18 NOTE — Op Note (Signed)
[name], Bianca [name]                  ACCOUNT NO.:  [id number]   MEDICAL RECORD NO.:  [id number]          PATIENT TYPE:  INP   LOCATION:  2305                         FACILITY:  [facility]   PHYSICIAN:  Bedelia Person, M.D.        DATE OF BIRTH:  09-15-1930   DATE OF PROCEDURE:  08/17/2006  DATE OF DISCHARGE:                               OPERATIVE REPORT   SURGEON:  Kerin Perna, M.D.   PROCEDURE:  Transesophageal echocardiography done intraoperatively.   DESCRIPTION OF PROCEDURE:  Ms. Wildermuth denied any esophageal or gastric  medical conditions.  The TE will be used intraoperatively to assess left  ventricular function; as well as valvular function, some: no left  ventriculogram was performed during cardiac catheterization.  After  induction of general anesthesia, the airway with secured with an oral  endotracheal tube.  The transesophageal transducer was heavily  lubricated, placed in the sleeve, and the sleeve was lubricated and  passed on the oropharynx; blindly to the 40 cm mark.  They remained on  various Omniplane views throughout the procedure.  During the bypass  period it was left in the neutral, unflexed position.   The pre-bypass examination revealed the left ventricle to be slightly  thickened.  There was good overall contractility.  No segmental defects  were detected.  Mitral valve was normal in appearance and function.  The  color Doppler of the mitral valve revealed a trace to 1+ central  regurgitate pattern, along the entire coapting surface of the leaflets.  The appendage was clean.  The left atrium was normal size.  There was no  septal defects.  The aortic valve had three leaflets which opened and  closed appropriately.  The color Doppler revealed no aortic  insufficiency.  The aorta appear normal size.  The tricuspid valve was  normal in appearance.  There was a 1+ regurgitate flow.  It should be  noted that the Swan-Ganz catheter was across the valve.   The patient  underwent coronary artery bypass grafting x3.  At the  completion of the procedure a small amount of inotropic dobutamine was  used.  This was all within the dynamic contractility of the left  ventricle.  No other changes in the pre-bypass examination were noted.  No segmental defects, again, were detected in the left ventricle.           ______________________________  Bedelia Person, M.D.     LK/MEDQ  D:  08/18/2006  T:  08/18/2006  Job:  [id number]

## 2010-09-18 NOTE — Consult Note (Signed)
NAMEJATAYA, Bianca [name]                  ACCOUNT NO.:  [id number]   MEDICAL RECORD NO.:  [id number]          PATIENT TYPE:  INP   LOCATION:  6531                         FACILITY:  [facility]   PHYSICIAN:  Kerin Perna, M.D.  DATE OF BIRTH:  April 14, 1931   DATE OF CONSULTATION:  08/16/2006  DATE OF DISCHARGE:                                 CONSULTATION   REASON FOR CONSULTATION:  Severe three-vessel coronary artery disease,  Class IV unstable angina, subendocardial MI.   CHIEF COMPLAINT:  Chest pain and dizziness.   HISTORY OF PRESENT ILLNESS:  I was asked to evaluate this 77 year old  [place] female for potential surgical coronary revascularization for  recently diagnosed severe three-vessel coronary artery disease.  The  patient was admitted with recent onset of exertional chest pain and  dizziness and shortness of breath as well as nausea.  She was evaluated  in the emergency department earlier today by the teaching service and  was found to have positive cardiac enzymes.  Her 12-lead EKG showed  nonspecific changes.  A CT scan ruled out pulmonary embolus or  dissection.  A 2-D echocardiogram was ordered.  The patient underwent  cardiac catheterization by Dr. Diona Browner which demonstrated severe three-  vessel disease.  A left ventriculogram was not performed due to  creatinine clearance of 40 mL per minute.  She was found to have an 80%  stenosis of the LAD diagonal, 80% stenosis of the OM, and a 99% stenosis  of the proximal right with a 70% stenosis of the mid distal PDA.  The  last echocardiogram one year ago showed normal LV function with EF of 55-  60% and no significant valvular abnormalities.  Based on her coronary  anatomy and her diabetic history, surgical revascularization was  recommended.   PAST MEDICAL HISTORY:  1. Diabetes mellitus.  2. Hypertension.  3. Hyperlipidemia.  4. Status post lumbar laminectomy in 2001.  5. No known drug allergies.   HOME MEDICATIONS:  1.  Lantus insulin 15 units nightly.  2. Lipitor 40 mg.  3. Glipizide 10 mg b.i.d.  4. Cozaar 25 mg daily.  5. Lopressor 25 mg b.i.d.  6. Aspirin daily.  7. Protonix 40 mg daily.   SOCIAL HISTORY:  The patient is retired and lives with children.  Does  not smoke.   FAMILY HISTORY:  Noncontributory.   REVIEW OF SYSTEMS:  Negative for weight loss or fever.  She has a  history of hepatitis B, but apparently that has resolved with a negative  serology now.  She denies any significant trauma, DVT, varicose veins,  or bleeding disorder.  She says she tolerated the lumbar laminectomy in  2001 without problems from anesthesia, bleeding, or infection.  No  history of stroke, mini stroke, seizure, or concussion.  She does have  intermittent constipation and GERD.   PHYSICAL EXAMINATION:  VITAL SIGNS:  She is 5 feet tall, weighs 112  pounds.  Blood pressure 140/80, pulse 80, respirations 18.  GENERAL:  She is a pleasant, Oriental, petite woman in her hospital room  following cardiac catheterization,  surrounded by family, in no distress.  HEENT:  Normocephalic.  NECK:  Without JVD or carotid bruits.  LYMPHATICS:  No palpable adenopathy.  LUNGS:  Breath sounds are clear, and there is no thoracic deformity.  CARDIAC:  Regular rate and rhythm without murmur or S3 gallop.  ABDOMEN:  Soft without pulsatile mass.  EXTREMITIES:  Reveal no clubbing or cyanosis, and pulses are present.  SKIN:  Without rash or lesion.  NEUROLOGIC:  Exam is nonfocal.   She does not speak English, and the entire evaluation, discussion, and  recommendations were translated from the daughter to the patient and  back to me.   LABORATORY DATA:  I have reviewed the coronary arteriograms.  She has  severe three-vessel disease.  I reviewed the last echocardiogram in  2007, and she has good LV function.  Blood sugar was 320 on presentation  with a creatinine of 1.2.  BNP is 90.  Troponin 0.1.  LFTs normal.   PLAN:  The  patient will be prepared for surgical revascularization in  the morning.  I discussed the procedure in detail with the patient and  family including alternatives and associated risks of bleeding, MI,  stroke, infection, and death.  All members of the family including the  patient understand and agree to proceed with surgery in the morning.   Thank you for the consultation.      Kerin Perna, M.D.  Electronically Signed     PV/MEDQ  D:  08/16/2006  T:  08/16/2006  Job:  [id number]   cc:   CVTS Office  [facility]

## 2010-09-18 NOTE — Op Note (Signed)
[facility]  Patient:    Bianca [name], Bianca [name]                           MRN: [id number] Proc. Date: 02/29/00 Adm. Date:  [id number] Attending:  Marlowe Kays Page                           Operative Report  PREOPERATIVE DIAGNOSIS:  Central and lateral restenosis L3-4, L4-5, L5-S1 status post central laminectomy and diskectomy L4-5.  POSTOPERATIVE DIAGNOSIS:  Central and lateral restenosis L3-4, L4-5, L5-S1 status post central laminectomy and diskectomy L4-5.  PROCEDURE:  Central and lateral decompression L3-4, L4-5, L5-S1.  SURGEON:  Illene Labrador. [place], M.D.  ASSISTANT:  Philips J. Montez Morita, M.D.  ANESTHESIA:  General.  PROCEDURE:  In 1992 she had had a central decompression bilateral diskectomy at L4-5 and had done well until several years ago when she fell sustaining a fracture of the body of L4.  She has had progressive left leg pain mainly, but slightly right leg pain, and lumbar myelogram has demonstrated spinal stenosis at L3-4, L4-5, and L5-S1 with poor filling of the nerve roots bilaterally. She has subluxation of L4 on L5.  PROCEDURE:  Prophylactic antibiotics, satisfactory general anesthesia, Foley catheter inserted, placed in knee chest position on the [place] frame.  Back was prepped with duraPrep and draped in a sterile field, IV employed. Surgical approach was through the old surgical incision and extended proximally and distally.  The defect of the previous removal of the spinous process and neural arch of L4 was noted and the 2 spinous processes above and below were tied with Kocher clamps and lateral x-ray taken to confirm that these were L3 and L5.  We carefully dissected the soft tissue off the neural arches of L3 and L5 including partly the sacrum, and L2.  Self retaining McCullough retractors were inserted being careful that we did not injure the dura at the previous level of surgery.  We first used a combination of double action  rongeur and Kerrison rongeurs to remove the neural arch of L5 and portion of superior sacrum.  She was quite tight at this level.  Removed bone and ligamentum flavum laterally and did a wide foraminotomy decompressing the S1 nerve root.  I then began working superiorly, working around the previous bony perimeter until we could up to the L5 nerve roots.  Bilateral foraminotomies were performed so that hockey stick fit easily in both the L5 nerve root foramina bilaterally.  We then went superiorly and removed the spinous process and neural arch of L3, working up to L2.  Then cautiously worked distally getting to down to the previous level of dissection, removing bone and ligamentum flavum.  A wide foraminotomy of the L4 nerve roots was performed with direct visualization of the nerve.  This brought [place] down to the superior level of the previous central decompression and from both ends I worked removing some of the bone at the L4-5 space, but I tried to minimize a risk by not removing any more scar than need be once the nerve roots of L4, L5 and S1 had been decompressed on each side.  When we felt the decompression had been completed and that centrally she was wide open at both ends beneath the previous scar at L4-5, we went ahead and concluded the case.  The wound was irrigated with sterile saline. She had  a good bit of oozing throughout the case.  A 1/4 inch Penrose drain was placed subcutaneously on the left side and on direct visualization the wound was closed around it with interrupted #1 Vicryl in the paralumbar muscles and fascia, 0 in the deep subcutaneous tissue, and 2-0 Vicryl in the superficial subcutaneous tissue and staples in the skin.  Betadine, Adaptic, dry sterile dressing were applied.  She tolerated the procedure well and was taken to the recovery room in satisfactory condition with no known complications.  ESTIMATED BLOOD LOSS:  Roughly 400 cc.  No blood replacement. DD:   02/29/00 TD:  02/29/00 Job: [id number]

## 2010-09-18 NOTE — Discharge Summary (Signed)
NAMESTEPHENIA, Bianca [name]                  ACCOUNT NO.:  [id number]   MEDICAL RECORD NO.:  [id number]          PATIENT TYPE:  INP   LOCATION:  3709                         FACILITY:  [facility]   PHYSICIAN:  Rollene Rotunda, MD, FACCDATE OF BIRTH:  01-Sep-1930   DATE OF ADMISSION:  04/01/2008  DATE OF DISCHARGE:  04/03/2008                               DISCHARGE SUMMARY   REASON FOR PRESENTATION:  Abdominal pain.   PRIMARY DISCHARGE DIAGNOSIS:  Abdominal pain of unclear etiology,  gastrointestinal bleed.   HISTORY OF PRESENT ILLNESS:  The patient was admitted on April 01, 2008, with what was said to be chest discomfort, but really was  intermittent abdominal pain.  It did radiate to her chest.  It was  different from the previous angina.  There was no objective evidence of  ischemia.  She did have prior coronary disease as described.  She had no  fevers or chills.  She did have some mild nausea.  She is weak.  She is  dizzy.  She has some mild shortness of breath.  This has been going on  for several hours prior to admission.   PAST MEDICAL HISTORY:  Coronary artery disease, status post CABG (April  2008 with LIMA to the LAD, SVG to OM, SVG to distal right coronary  artery and PDA.  She had repeat cath along with this demonstrating 99%  stenosis at the anastomosis of the graft to the PDA.  She was managed  medically), mildly reduced ejection fraction (40%), dyslipidemia,  diabetes mellitus, hypertension, spinal stenosis, gastroesophageal  reflux disease, hepatitis B, positive PPD in the past, positive right  humerus fracture.   HOSPITAL COURSE:  The patient was admitted.  She ruled out for  myocardial infarction.  She had negative EKGs.  During the course of her  hospitalization, she was found not to have a urinary infection.  She had  normal lipase.  She did not develop white count.  She did have elevated  blood sugars and this was treated with adjusted medications.  Her  triglycerides  were noted to be slightly elevated at 211, but again  treated with better blood pressure control.  She was found to have a  urinary tract infection with cultures being identified positive as an  outpatient.  This was managed as an outpatient.  Prior to discharge, she  was noted to be somewhat anemic.  She had stool guaiac that was  positive.  GI was consulted.  She was seen by Dr. Lina Sar.  The plan  was for an outpatient colonoscopy.  The patient was given these  instructions.   DISCHARGE INSTRUCTIONS:  The patient was on medications as listed prior.  She is to follow up with her primary care doctor.  She is to follow up  for management of UTI with cultures coming back after discharge.  She is  to follow up with Dr. Lina Sar for management of her heme positive  stool and anemia.  Cardiology also called her for followup appointment.   DISCHARGE MEDICATIONS:  1. Glipizide 10 mg daily b.i.d.  2. Protonix 40 mg daily.  3. Plavix 75 mg daily.  4. Aspirin 81 mg daily.  5. Coreg 12.5 mg b.i.d.  6. Nitroglycerin p.r.n.  7. Zocor 40 mg daily.  8. Lantus 40 units nightly.  9. Cozaar 50 mg daily.      Rollene Rotunda, MD, [facility]  Electronically Signed     JH/MEDQ  D:  06/13/2008  T:  06/13/2008  Job:  [id number]

## 2010-09-18 NOTE — Op Note (Signed)
NAMEVILA, DORY                  ACCOUNT NO.:  [id number]   MEDICAL RECORD NO.:  [id number]          PATIENT TYPE:  INP   LOCATION:  2305                         FACILITY:  [facility]   PHYSICIAN:  Kerin Perna, M.D.  DATE OF BIRTH:  [birth year]   DATE OF PROCEDURE:  08/17/2006  DATE OF DISCHARGE:                               OPERATIVE REPORT   OPERATION:  1. Coronary artery bypass grafting x5 (left internal mammary artery to      left anterior descending, sequential saphenous vein graft to right      coronary artery and posterior descending, saphenous vein graft to      diagonal, saphenous vein graft to obtuse marginal).  2. Endoscopic harvest of the greater saphenous vein, bilateral thighs.   SURGEON:  Kerin Perna, M.D.   ASSISTANT:  Coral Ceo, P.A.-C.   ANESTHESIA:  General.   PREOPERATIVE DIAGNOSIS:  Unstable angina, subendocardial myocardial  infarction, severe three vessel coronary artery disease, diabetes  mellitus.   POSTOPERATIVE DIAGNOSIS:  Unstable angina, subendocardial myocardial  infarction, severe three vessel coronary artery disease, diabetes  mellitus.   INDICATIONS:  The patient is a 77 year old [place] female who has had  exertional chest pain and shortness of breath.  This became more severe  and at rest and she was admitted with positive enzymes.  Cardiac  catheterization performed by Dr. Diona Browner demonstrated severe three  vessel disease with preserved LV function.  She is felt to be a  candidate for surgical evaluation.   Prior to surgery, I discussed the situation and results of the cath with  the patient and her family.  The patient does not understand English  well and the information I provided at the consultation and discussion  was translated by the daughters and family.  The patient and family  understood the indications and benefits as well as alternatives to  surgery for treatment of her severe three vessel coronary artery  disease.  They understood the major aspects including the choice of  conduit, location of the surgical incisions, use of general anesthesia  and cardiopulmonary bypass, and the expected postoperative recovery.  I  discussed with the patient and family the risks including the  probability of requiring blood transfusions due to her anemia and small  stature, the risks of MI, CVA, infection, and death.  After reviewing  this information, the patient and her daughters and family indicated  their understanding and agreement to proceed with surgery.   OPERATIVE FINDINGS:  The saphenous vein was harvested from both thighs  and was of good quality.  The mammary artery was small but had excellent  flow.  The coronaries were severely diseased.  The patient required 3  units of packed cells in the operating room to keep her hematocrit above  20.  The patient was given a unit of platelets at the end of the pump  run for a platelet count of 68,000.   PROCEDURE:  The patient was brought to operating room and placed supine  on the operating table where general anesthesia was induced.  The chest,  abdomen and legs were prepped with Betadine and draped as a sterile  field.  A sternal incision was made as the saphenous vein was harvested  endoscopically from both upper legs.  The left internal mammary artery  was harvested as a pedicle graft from its origin at the subclavian  vessels.  The sternal retractor was placed and the pericardium was  opened and suspended.  Pursestrings were placed in the ascending aorta  and right atrium and after the vein had been harvested and inspected,  the patient was cannulated and placed on bypass after heparin was  administered and the ACT was therapeutic.  The coronaries were  identified for grafting and the mammary artery and vein grafts were  prepared for the distal anastomoses.  A cardioplegia catheter was placed  in the ascending aorta and the patient was cooled to  32 degrees.  The  aortic crossclamp was applied and 500 mL of cold blood cardioplegia was  delivered to the aortic root with good cardioplegic arrest and septal  temperature dropping to less than 12 degrees.  The patient was given  cardioplegia every 20 minutes while the crossclamp was in place.   The distal coronary anastomoses were then performed.  The first distal  anastomosis was to the right coronary artery and was a component of a  sequential vein graft to the RCA continuing to the posterior descending.  The RCA was a 1.5 mm vessel and had a proximal 99% stenosis and a side-  to-side anastomosis of the vein was constructed using running 7-0  Prolene.  The second distal anastomosis was a continuation of the  sequential vein graft to the posterior descending.  This was a smaller  1.5 mm vessel but had a proximal 80-90% stenosis and the anastomosis was  constructed end-to-side with a running 7-0 Prolene.  Cardioplegia was  dosed to the vein graft.  The third distal anastomosis was the OM branch  of the circumflex.  This was a 1.5 mm vessel and had a proximal 80%  stenosis and a reverse saphenous vein was sewn end-to-side with running  7-0 Prolene.  The fourth distal anastomosis was to the diagonal branch  of the LAD.  This was a 1.4 mm vessel with a proximal 80% stenosis and a  reverse saphenous vein was sewn end-to-side with running 7-0 Prolene.  Cardioplegia was redosed.  The fifth distal anastomosis was to the  distal aspect of the LAD.  This was a 1.5 mm vessel and had a proximal  90% stenosis.  The left IMA pedicle was brought through an opening  created in the left lateral pericardium and was brought down to the LAD  and sewn end-to-side with running 8-0 Prolene.  There was good flow  through the anastomosis after briefly releasing the pedicle bulldog on  the mammary artery.  The mammary pedicle was clamped with the bulldog and the pedicle secured to the epicardium with 6-0  Prolene.  Cardioplegia was redosed.   While the crossclamp was still in place, three proximal vein anastomoses  were performed on the ascending aorta using a 4 mm punch and running 6-0  Prolene.  Prior to tying down the final proximal anastomosis, air was  vented from the coronaries and the left side of the heart using the  usual de-airing maneuvers on bypass.  The crossclamp was then removed.   The heart resumed a spontaneous rhythm.  Air was aspirated from the vein  grafts with a 27 gauge needle.  The proximal and distal anastomoses were  checked and found to be hemostatic.  The patient was rewarmed to 37  degrees.  Temporary pacing wires were applied.  The lungs were re-  expanded and the ventilator was resumed.  The patient was weaned from  bypass after being rewarmed to 37 degrees and reperfused.  Cardiac  output and blood pressure were stable.  Protamine was administered  without adverse reaction.  The cannulae were removed and the mediastinum  was irrigated with warm antibiotic irrigation.  The leg incision was  irrigated and closed in a standard fashion.  The superior pericardial  fat was closed over the aorta and two mediastinal and a left pleural  chest tube were placed and brought out through separate incisions.  The  sternum was closed with interrupted steel wire.  The pectoralis fascia  and subcutaneous layers were closed using running Vicryl.  The skin was  closed with a subcuticular suture.  Total bypass time was 130 minutes  with a crossclamp time of 81 minutes.     Kerin Perna, M.D.  Electronically Signed    PV/MEDQ  D:  08/17/2006  T:  08/17/2006  Job:  [id number]

## 2010-09-18 NOTE — Consult Note (Signed)
Bianca [name], Bianca [name]                  ACCOUNT NO.:  [id number]   MEDICAL RECORD NO.:  [id number]          PATIENT TYPE:  INP   LOCATION:  6529                         FACILITY:  [facility]   PHYSICIAN:  Rollene Rotunda, MD, FACCDATE OF BIRTH:  09-Mar-1931   DATE OF CONSULTATION:  08/16/2006  DATE OF DISCHARGE:                                 CONSULTATION   PRIMARY CARE PHYSICIAN:  Dr. Harriett Sine Phifer.   REASON FOR CONSULTATION:  Evaluate patient with chest pain and abnormal  cardiac enzymes.   HISTORY OF PRESENT ILLNESS:  The patient is a 77 year old [place]  female.  She had had no prior cardiac history, but has multiple  cardiovascular risk factors.  Her granddaughter interprets for her.  She  reports 2-3 months of chest discomfort.  This has been a left-sided  sharp discomfort.  It typically radiates through to her back.  She has  been noticing it reproducibly with the walking that she does daily when  she gets to a certain level.  It seems to be coming on with more  frequency and intensity.  She started having resting discomfort  recently, and that is what brought her to the emergency room.  She  describes the discomfort as severe and occasionally 10/10.  It has been  improved with nitrates but not completely gone away here.  It has been  improved with morphine as well.  She has been short of breath with it.  She gets somewhat nauseated.  She is not having any diaphoresis.  She is  not having any palpitations, presyncope or syncope.  She is not having  PND or orthopnea.  She has never had this kind of discomfort prior to  this.  There may be some worsening with deep inspiration.  She did have  a CT which was negative for any acute process and negative for pulmonary  embolism.   PAST MEDICAL HISTORY:  Insulin-dependent diabetes mellitus x3-4 years,  hypertension x3-4 years, hyperlipidemia x3-4 years, spinal stenosis,  gastroesophageal reflux disease, hepatitis B.   PAST SURGICAL  HISTORY:  Lumbar spine decompression November 2001.   ALLERGIES:  None.   MEDICATIONS:  1. Lantus insulin.  2. Lipitor 40 mg daily.  3. Glipizide.  4. Cozaar 25 mg daily.  5. Robitussin.  6. Metoprolol 25 mg b.i.d.  7. Aspirin.  8. Protonix.   SOCIAL HISTORY:  The patient lives in [place] with family.  She has  eight children.  Does not smoke cigarettes or drink alcohol.   FAMILY HISTORY:  Unknown.   REVIEW OF SYSTEMS:  Positive for some dizziness in the ER yesterday,  constipation, questionable fevers and sweats at home.  Negative for  other systems.   PHYSICAL EXAMINATION:  GENERAL:  Patient is in no distress.  She is  pleasant.  VITAL SIGNS:  Blood pressure 156/76, heart rate 76 and regular,  temperature 98.6, blood pressure 110/56.  HEENT:  Eyes unremarkable, pupils equal, round, and react to light,  fundi not visualized, oral mucosa unremarkable.  NECK:  No jugular distension, waveform within normal limits, carotid  upstroke brisk  and symmetric, no bruits, no thyromegaly.  LYMPHATICS:  No cervical, axillary, inguinal adenopathy.  LUNGS:  Clear to auscultation bilaterally.  BACK:  No costovertebral angle tenderness.  CHEST:  Unremarkable.  HEART:  PMI not displaced or sustained, S1, S2 within normal limits.  No  S3, no S4.  No clicks, no rubs, no murmurs.  ABDOMEN:  Flat, positive bowel sounds normal in frequency and pitch.  No  bruits, no rebound, no guarding, no midline pulsatile mass.  No  hepatomegaly, no splenomegaly.  SKIN:  No rashes.  EXTREMITIES:  2+ pulses throughout.  No edema, no cyanosis, no clubbing.  NEUROLOGICAL:  Oriented to person, place, time.  Cranial nerves II-XII  grossly intact.  Motor grossly intact.   EKG:  Sinus rhythm, rate 62, axis within normal limits, intervals within  normal limits.  No acute ST-T wave change.   LABORATORY DATA:  WBC 5.7, hemoglobin 11.5, platelets 177.  Sodium 143,  potassium 4, BUN 19, creatinine 1.1. Troponin  0.24, CK-MB 98 and 2.3.   ASSESSMENT/PLAN:  1. Chest:  The patient's chest discomfort has some typical but also      some atypical features.  However, she has significant      cardiovascular risk factors.  She has an abnormal troponin.  There      is no other etiology identified.  Given this the pretest      probability of obstructive coronary disease is very high as the      culprit for her pain.  Therefore, the next step would be a cardiac      catheterization.  I have explained this to the granddaughter in      great detail including the risk of stroke, death, myocardial      infarction, allergy, renal insufficiency, embolism, vascular      trauma, bleeding, bruising, and infection.  She understands and      agrees to proceed.  She will continue with current therapy to      include heparin.  Will use beta blocker and aspirin.  2. Renal sufficiency.  She still does have some mild renal      insufficiency.  She is being hydrated currently.  Will avoid a left      ventriculogram (I do know that her echo shows a normal EF).      However, I think given her ongoing symptoms it is important to      proceed with the cardiac catheterization using minimal dye.  3. Diabetes per the primary team.  4. Risk reduction.  She needs a lipid profile and TSH.      Rollene Rotunda, MD, [facility]  Electronically Signed     JH/MEDQ  D:  08/16/2006  T:  08/16/2006  Job:  [id number]

## 2010-09-18 NOTE — H&P (Signed)
Bianca [name], Bianca [name]                  ACCOUNT NO.:  [id number]   MEDICAL RECORD NO.:  [id number]          PATIENT TYPE:  INP   LOCATION:  1827                         FACILITY:  [facility]   PHYSICIAN:  Duke Salvia, MD, FACCDATE OF BIRTH:  05/17/1930   DATE OF ADMISSION:  09/04/2006  DATE OF DISCHARGE:                              HISTORY & PHYSICAL   PRIMARY CARDIOLOGIST:  Dr. Antoine Poche.   CHIEF COMPLAINT:  Chest pain.   HISTORY OF PRESENT ILLNESS:  This is a 77 year old woman who recently  underwent a five-vessel CABG on August 17, 2006 who had sudden onset of  chest pain at 2 a.m. while asleep.  She describes the chest pain as all  throughout her anterior precordium, chest pain with shortness of breath  and diaphoresis.  She endorses a pleuritic component but no relievers.  She says it is a 7 out of 10.  She describes not radiating pain but  whole body pain.  She states that she has done well after her CABG and  has been without any symptoms of chest pain.  She states the chest pain  that brought her into the emergency room tonight was similar to the  chest pain that she had when she had when she had her non-ST segment  elevation myocardial infarction that prompted her three-vessel CABG.  She states that she had also been compliant with her home regimen of  medications.  Furthermore, she states that she has had a little bit of a  right thigh cellulitis from her vein harvest site that she had been  taking antibiotics for.  Her daughter provides the bulk of translation  given that the patient does  not speak much [place].   In the Emergency Department, the patient was started on nitroglycerin  drip and given four 81 mg doses of aspirin with some improvement in her  symptoms.   REVIEW OF SYSTEMS:  As above in the HPI.  Remaining 18-point review of  systems is negative.   PAST MEDICAL HISTORY:  1. Non-ST-elevation MI in April 2008 with coronary catheterization      that showed  three-vessel coronary disease status post five CABG on      08/17/2006 with a LIMA to the LAD, a sequential SVG graft to the      RCA and the PDA, a SVG to a diagonal, and an SVG to an OM.  2. Diabetes mellitus.  3. Hypertension.  4. Hyperlipidemia.  5. Status post laminectomy in 2001.   ALLERGIES:  None.   MEDICATIONS:  1. Cozaar 25 mg daily.  2. Lipitor 40 mg daily.  3. Protonix 40 mg daily.  4. Lasix 20 mg daily.  5. Glipizide 10 mg b.i.d.   SOCIAL HISTORY:  She lives in [place] with her son.  She has no  habits.   FAMILY HISTORY:  Noncontributory.   PHYSICAL EXAMINATION:  VITAL SIGNS:  Blood pressure is 132/78.  Pulse is  93.  Respiratory rate 13.  Temperature 98.0.  Her weight is 108 pounds.  O2 saturation is 97% on room air.  GENERAL:  In general, her daughter is at her bedside.  She is a thin-  appearing Asian woman.  HEENT EXAM:  Normocephalic, atraumatic.  Pupils are equal, round, and  reactive to light.  Extraocular movements are intact.  NECK:  Her neck is supple with no lymphadenopathy; no bruits; JVP is  flat.  LUNGS:  Clear to auscultation bilaterally without any wheezes, rhonchi,  or rales.  CARDIOVASCULAR EXAM:  Regular rate and rhythm; normal S1 and S2 without  any murmurs, rubs, or gallops.  She has some fremitus with slight  palpation along her sternum; however her median sternotomy wound is well  healed.  ABDOMEN:  Positive bowel sounds; soft, nontender, nondistended without  any palpable masses.  EXTREMITY EXAM:  2+ pulses; no lower extremity edema, clubbing, or  cyanosis.  She has erythema probably about 10 cm x 10 cm along her right  medial thigh where her vein harvest was done on her thigh.  Extremities:  2+ pulses; no lower extremity edema, clubbing, or cyanosis.  NEUROLOGIC EXAM:  Alert and oriented x3; cranial nerves III-XII; she is  moving all extremities spontaneously.   LABORATORY DATA:  Chest x-ray shows status post median sternotomy  with a  small left effusion; normal cardiac silhouette without any acute  pulmonary edema.  Her EKG shows normal sinus rhythm at a rate of 92 with  normal axis, normal intervals, with inferior Q waves and T-wave  inversions laterally which are new compared to a prior during her  previous hospitalization.  White count of 6.1, hematocrit 31, platelets  228, creatinine 4.3, BUN 26, glucose 287, potassium 4.5, bicarbonate 21.  CK-MB is 2.1 with troponin of 1.2.   ASSESSMENT:  1. Potential non-ST-elevation myocardial infarction in the setting of      a recent five-vessel coronary artery bypass graft; this could most      likely be due to early graft failure.  2. Diabetes mellitus.  3. Hypertension.  4. Mild cellulitis in the harvest site along the right medial aspect      of her thigh.   PLAN:  The patient will be admitted to a stepdown bed.  She will be  started on heparin and a IIb/IIIa.  She will also be continued on  secondary prevention with beta blocker, aspirin, and an ARB.  She will  be placed on an insulin sliding scale.  We will hold her oral  hypoglycemics.  We will have the primary team decide in the morning  about the timing of the cardiac catheterization. We will continue to  titrate up her nitroglycerin drip until she is chest pain-free as well  as give her some morphine as needed for her ongoing chest pain.  Additionally, we will continue to cycle her enzymes, check basic labs  again in the morning.     ______________________________  Eston Esters. Sherryll Burger, MD      Duke Salvia, MD, [facility]  Electronically Signed   BRS/MEDQ  D:  09/04/2006  T:  09/04/2006  Job:  [id number]

## 2010-09-18 NOTE — Discharge Summary (Signed)
Bianca [name], [name] NO.:  [id number]   MEDICAL RECORD NO.:  [id number]          PATIENT TYPE:  INP   LOCATION:  2033                         FACILITY:  [facility]   PHYSICIAN:  Kerin Perna, M.D.  DATE OF BIRTH:  11/16/1930   DATE OF ADMISSION:  08/15/2006  DATE OF DISCHARGE:                               DISCHARGE SUMMARY   PRIMARY DIAGNOSES:  1. Unstable angina.  2. Subendocardial myocardial infarction.  3. Severe three-vessel coronary artery disease.   IN-HOSPITAL DIAGNOSIS:  Postoperative thrombocytopenia.   SECONDARY DIAGNOSES:  1. Diabetes mellitus.  2. Hypertension.  3. Hyperlipidemia.  4. Status post lumbar laminectomy in 2001.   IN-HOSPITAL OPERATIONS AND PROCEDURES:  1. Cardiac catheterization.  2. Coronary artery bypass grafting x5 using a left internal mammary      artery to the left anterior descending, sequential saphenous vein      graft to right coronary artery and posterior descending, saphenous      vein graft to the diagonal, saphenous vein graft to obtuse      marginal.  3. Endoscopic vein harvesting of greater saphenous veins from      bilateral thighs.   HISTORY PHYSICAL AND HOSPITAL COURSE:  The patient is a 77 year old  [place] female who was admitted with recent onset of exertional chest  pain and dizziness with shortness of breath as well as nausea.  He was  evaluated in the emergency department and was found to have positive  cardiac enzymes.  A 12-lead EKG showed nonspecific changes.  CT scan  ruled out pulmonary emboli or dissection.  The patient then underwent  cardiac catheterization by Dr. Diona Browner, which demonstrates severe three-  vessel disease.  A left ventriculogram was not performed due to  creatinine clearance of 48 mL/min.  Following catheterization, Dr. Donata Clay was consulted.  Dr. Donata Clay saw and evaluated the patient.  He  discussed with patient undergoing coronary artery bypass grafting.  The  patient  and family acknowledged their understanding and agreed to  proceed.  Surgery was scheduled for August 17, 2006.  Preoperatively, the  patient underwent bilateral carotid duplex ultrasounds showing no  evidence of significant ICA stenosis.  She also had bilateral ABIs done  and showed bilateral of greater than 1.0.  The patient remained stable  prior to going to the operating room.  For details of the patient's past  medical history and physical exam, please see dictated H&P.   The patient was taken to the operating room on August 17, 2006, where she  underwent coronary artery bypass grafting x5, using a left internal  mammary artery to the left anterior descending, sequential saphenous  vein graft to right coronary artery and posterior descending, saphenous  vein graft to the diagonal, saphenous vein graft to obtuse marginal.  The patient tolerated the procedure well and was transferred to the  intensive care unit in stable condition.  Immediately postoperatively,  the patient was hemodynamically stable.  She was extubated the evening  of surgery.  Following extubation, patient was noted to be alert and  oriented  x4.  The patient's postoperative course was pretty much  unremarkable.  Vital signs postop day #1 remained stable.  Postoperative  chest x-ray was stable.  The patient's pulmonary status was stable.  Lines and chest tubes were discontinued.  The patient was transferred  out to 2000 postop day #2.  Vital signs continued to be monitored.  The  patient was able to be weaned off oxygen, saturating greater than 90% on  room air.  The patient does have a history of diabetes mellitus and  blood sugars were followed closely.  The patient was restarted back on  her glipizide p.o. and continue and sliding-scale insulin.  If blood  sugars remain elevated, we will need to restart Lantus insulin.  The  patient did have postoperative thrombocytopenia; this was followed  closely and seemed to be  improving prior to discharge home.  The patient  remained in normal sinus rhythm postoperatively.  The patient's  pulmonary status remained stable.  Incisions were clean, dry and intact  and healing well.  The patient was out of bed and ambulating well with  assistance.  She was tolerating diet well with no nausea or vomiting  noted.   Labs postop day #4:  White count showed 10.8, hemoglobin 11.5,  hematocrit 32.8 and platelet count was 115,000.  BMP postop day #3  showed a sodium of 139, potassium 3.8, chloride of 104, bicarb of 26,  BUN of 10, creatinine of 0.9, glucose of 73.   The patient is tentatively ready for discharge home on the a.m., postop  day #5, August 22, 2006.   FOLLOWUP APPOINTMENTS:  A followup appointment will be arranged with Dr.  Donata Clay to occur in 3 weeks.  Our office will contact the patient with  this information.  The patient will need to follow up Dr. Antoine Poche in 2  weeks; she will need to contact Dr. Jenene Slicker office to arrange this  appointment.   ACTIVITY:  The patient is instructed in no driving until released to do  so, no lifting over 10 pounds.  The patient is told to ambulate 3-4  times per day, progress as tolerated and to continue her breathing  exercises.   INCISIONAL CARE:  The patient is told to shower, washing her incisions  using soap and water.  She is contact the office if she develops any  drainage or opening from any of her incision sites.   DIET:  The patient was educated on diet to be low-fat, low-salt as well  as carbohydrate-modified medium-calorie diet.   DISCHARGE MEDICATIONS:  1. Aspirin 81 mg daily.  2. Lopressor 25 mg b.i.d..  3. Lipitor 40 mg at night.  4. Glipizide 10 mg b.i.d.  5. Multivitamin daily.  6. Protonix 40 mg daily.  7. Cozaar 25 mg daily.  8. Vicodin 5/325 one to two tablets q.4-6 h. p.r.n..      Bianca Belfast, PA      Kerin Perna, M.D.  Electronically Signed    KMD/MEDQ  D:   08/21/2006  T:  08/22/2006  Job:  [id number]   cc:   Rollene Rotunda, MD, [facility]

## 2010-09-18 NOTE — Discharge Summary (Signed)
[place]  Patient:    Bianca [name], Bianca [name]                           MRN: [id number] Adm. Date:  [id number] Disc. Date: [id number] Attending:  Marlowe Kays Page Dictator:   Alexzandrew L. Perkins, P.A.-C.                           Discharge Summary  ADMITTING DIAGNOSES: 1. Spinal stenosis L3-4, L4-5, L5-S1. 2. Hypertension. 3. Insulin dependent diabetes mellitus. 4. Hypercholesterolemia.  DISCHARGE DIAGNOSES: 1. Central and lateral restenosis at L3-4, L4-5, L5-S1 status post central and    lateral decompression L3-4, L4-5, L5-S1. 2. Postoperative hypokalemia, improved. 3. Postoperative hemorrhagic anemia. 4. Status post transfusion. 5. Hypertension. 6. Insulin-dependent diabetes mellitus. 7. Hypercholesterolemia.  PROCEDURES:  The patient was taken to the OR on February 29, 2000 and underwent a central and lateral decompression of the L3-4, L4-5, L5-S1 levels.  SURGEON:  Illene Labrador. Aplington, M.D.  ASSISTANT:  Philips J. Montez Morita, M.D.  ANESTHESIA:  General.  CONSULTS:  None.  BRIEF HISTORY:  The patient is a 77 year old female who previously had a central decompression and bilateral diskectomy at L4-5 back in 1992. She did quite well for several years until sustaining a fall and a fracture of the body of L4. She has had progressive left leg pain mainly, but slight right leg pain also. She was seen and evaluated and sent for a lumbar myelogram, which did demonstrate a spinal stenosis at L3-4, L4-5 and L5-S1 with poor filling of the _____ bilaterally. She also had some subluxation of the L4 on L5. These findings were discussed and it was felt that she would benefit from undergoing surgical intervention. She was subsequently admitted to [facility].  LABORATORY DATA:  CBC on admission; hemoglobin 13.4, hematocrit 38.1, white cell count 8.1, red cell count 4.23. Postoperative hemoglobin dropped down to a level of 7.6 and then again noted  at 7.1 with a hematocrit of 20. She was transfused with a post transfusion hemoglobin of 9.2 and 25.9. The last noted H&H was 9.3 and 26.9. Differential on admission was within normal limits. PT and PTT on admission were 12.6 and 29 respectively with an INR of 1.0. Chemistry panel on admission; slightly low potassium to start out with on admission of 3.4, glucose 116, AST elevation at 47. The remaining chemistry panel all within normal limits. Followup BMET showed a large drop in potassium fracture from 3.4 to 2.9 and then again was noted at 3.0 on the following day, October 30 and October 31 respectively. The patient was treated for hypokalemia and the potassium came back up to 3.7. She was also noted to have a drop in sodium from 135 to 130. Fluids were adjusted and the sodium came back up to 135, noted on March 03, 2000. Urinalysis on admission showed a moderate leukocyte esterase with a few epithelial cells, 0 to 5 white cells and only a few bacteria, blood group type B+.  EKG dated February 26, 2000, normal sinus rhythm, T wave flattening, normal EKG, confirmed by Dr. Berton Mount. Portable spine film dated February 29, 2000, single lateral view shows clamps on the spinous processes at L3 and L5. Chest x-ray report dated on the chart as October 15, 1998, right hilar fullness suspicious for adenopathy. Lungs are clear, heart is normal in appearance, increased from the prior exam suspicious  of adenopathy and CT is recommended.  HOSPITAL COURSE:  The patient was admitted to [facility] and taken to the OR and underwent the above set of procedures without complications. The patient tolerated the procedure well and was later taken to the recovery room and then to the orthopedic floor for continued postoperative care. Vital signs are followed on the patient throughout the hospital course. A Penrose drain was placed at the time of surgery. The patient was found to have a drop  in hemoglobin postoperatively to 7.6. The patient was typed and crossed and transfused with blood. Communication with the patient throughout the hospital course was somewhat difficult since the patient did not speak [place]. However, she did have a daughter which was very attentive during the hospital course and that aided in translation and communication with this patient. The patient tolerated the transfusion well. Hemoglobin responded. BMETs were followed very closely and she was noted to have a drop in potassium. She was placed on potassium supplements and responded well. Physical therapy was consulted to assist with gait training and ambulation. The patient tolerated therapy quite well and improved daily. By day #3, she was ambulating greater than 250 feet. She had been weaned on the hospital down to p.o. analgesics. The Penrose drain was pulled by postoperative day #2. The Foley was discontinued on postoperative day #2. By day #3, the patient was doing quite well and it was decided that the patient could be discharged home.  DISCHARGE PLAN:  The patient is discharged home on March 03, 2000.  DISCHARGE DIAGNOSES:  Please see above.  DISCHARGE MEDICATIONS: 1. The patient was given Percocet p.r.n. pain. 2. Robaxin p.r.n. spasm. 3. Colace.  FOLLOWUP:  Two weeks from surgery.  DISCHARGE DIET:  As tolerated.  DISCHARGE ACTIVITIES:  Up as tolerated. Walking for exercise. Placed on back precautions.  DISPOSITION:  Home.  CONDITION ON DISCHARGE:  Improved. DD:  03/23/00 TD:  03/25/00 Job: [id number]

## 2010-09-29 ENCOUNTER — Other Ambulatory Visit: Payer: Self-pay | Admitting: Internal Medicine

## 2010-10-21 ENCOUNTER — Other Ambulatory Visit: Payer: Self-pay | Admitting: Cardiology

## 2010-10-28 ENCOUNTER — Other Ambulatory Visit (INDEPENDENT_AMBULATORY_CARE_PROVIDER_SITE_OTHER): Payer: Medicare Other | Admitting: *Deleted

## 2010-10-28 DIAGNOSIS — E119 Type 2 diabetes mellitus without complications: Secondary | ICD-10-CM

## 2010-10-28 MED ORDER — ONETOUCH ULTRASOFT LANCETS MISC: Status: DC

## 2010-11-09 ENCOUNTER — Other Ambulatory Visit: Payer: Self-pay | Admitting: *Deleted

## 2010-11-09 MED ORDER — GLUCOSE BLOOD VI STRP: ORAL_STRIP | Status: DC

## 2010-12-14 ENCOUNTER — Emergency Department (HOSPITAL_COMMUNITY): Payer: Medicare Other

## 2010-12-14 ENCOUNTER — Inpatient Hospital Stay (HOSPITAL_COMMUNITY)
Admission: EM | Admit: 2010-12-14 | Discharge: 2010-12-16 | DRG: 287 | Disposition: A | Discharge status: Home / Self Care | Payer: Medicare Other | Source: Ambulatory Visit | Attending: Cardiology | Admitting: Cardiology

## 2010-12-14 DIAGNOSIS — Z794 Long term (current) use of insulin: Secondary | ICD-10-CM

## 2010-12-14 DIAGNOSIS — I2510 Atherosclerotic heart disease of native coronary artery without angina pectoris: Secondary | ICD-10-CM

## 2010-12-14 DIAGNOSIS — E876 Hypokalemia: Secondary | ICD-10-CM | POA: Diagnosis present

## 2010-12-14 DIAGNOSIS — I25810 Atherosclerosis of coronary artery bypass graft(s) without angina pectoris: Principal | ICD-10-CM | POA: Diagnosis present

## 2010-12-14 DIAGNOSIS — E785 Hyperlipidemia, unspecified: Secondary | ICD-10-CM | POA: Diagnosis present

## 2010-12-14 DIAGNOSIS — I129 Hypertensive chronic kidney disease with stage 1 through stage 4 chronic kidney disease, or unspecified chronic kidney disease: Secondary | ICD-10-CM | POA: Diagnosis present

## 2010-12-14 DIAGNOSIS — K219 Gastro-esophageal reflux disease without esophagitis: Secondary | ICD-10-CM | POA: Diagnosis present

## 2010-12-14 DIAGNOSIS — I252 Old myocardial infarction: Secondary | ICD-10-CM

## 2010-12-14 DIAGNOSIS — D696 Thrombocytopenia, unspecified: Secondary | ICD-10-CM | POA: Diagnosis present

## 2010-12-14 DIAGNOSIS — E119 Type 2 diabetes mellitus without complications: Secondary | ICD-10-CM | POA: Diagnosis present

## 2010-12-14 DIAGNOSIS — N183 Chronic kidney disease, stage 3 unspecified: Secondary | ICD-10-CM | POA: Diagnosis present

## 2010-12-14 DIAGNOSIS — Z7982 Long term (current) use of aspirin: Secondary | ICD-10-CM

## 2010-12-14 DIAGNOSIS — D649 Anemia, unspecified: Secondary | ICD-10-CM | POA: Diagnosis present

## 2010-12-14 DIAGNOSIS — R079 Chest pain, unspecified: Secondary | ICD-10-CM

## 2010-12-14 DIAGNOSIS — Z7902 Long term (current) use of antithrombotics/antiplatelets: Secondary | ICD-10-CM

## 2010-12-14 DIAGNOSIS — I200 Unstable angina: Secondary | ICD-10-CM | POA: Diagnosis present

## 2010-12-14 LAB — CK TOTAL AND CKMB (NOT AT ARMC)
CK, MB: 2.2 ng/mL (ref 0.3–4.0)
Relative Index: INVALID (ref 0.0–2.5)
Total CK: 66 U/L (ref 7–177)

## 2010-12-14 LAB — CBC
Hemoglobin: 11.1 g/dL — ABNORMAL LOW (ref 12.0–15.0)
MCHC: 33 g/dL (ref 30.0–36.0)
Platelets: 130 10*3/uL — ABNORMAL LOW (ref 150–400)
RDW: 14.2 % (ref 11.5–15.5)

## 2010-12-14 LAB — GLUCOSE, CAPILLARY
Glucose-Capillary: 180 mg/dL — ABNORMAL HIGH (ref 70–99)
Glucose-Capillary: 125 mg/dL — ABNORMAL HIGH (ref 70–99)
Glucose-Capillary: 341 mg/dL — ABNORMAL HIGH (ref 70–99)

## 2010-12-14 LAB — HEPATIC FUNCTION PANEL
ALT: 12 U/L (ref 0–35)
AST: 19 U/L (ref 0–37)
Alkaline Phosphatase: 52 U/L (ref 39–117)
Bilirubin, Direct: 0.1 mg/dL (ref 0.0–0.3)
Indirect Bilirubin: 0.6 mg/dL (ref 0.3–0.9)

## 2010-12-14 LAB — POCT I-STAT TROPONIN I: Troponin i, poc: 0 ng/mL (ref 0.00–0.08)

## 2010-12-14 LAB — BASIC METABOLIC PANEL
Calcium: 9.3 mg/dL (ref 8.4–10.5)
GFR calc Af Amer: 51 mL/min — ABNORMAL LOW (ref >60)
GFR calc non Af Amer: 42 mL/min — ABNORMAL LOW (ref >60)
Potassium: 3.2 mEq/L — ABNORMAL LOW (ref 3.5–5.1)
Sodium: 138 mEq/L (ref 135–145)

## 2010-12-14 LAB — DIFFERENTIAL
Basophils Absolute: 0 10*3/uL (ref 0.0–0.1)
Basophils Relative: 0 % (ref 0–1)
Eosinophils Absolute: 0.1 10*3/uL (ref 0.0–0.7)
Monocytes Absolute: 0.5 10*3/uL (ref 0.1–1.0)
Neutro Abs: 3.7 10*3/uL (ref 1.7–7.7)

## 2010-12-14 LAB — CARDIAC PANEL(CRET KIN+CKTOT+MB+TROPI)
CK, MB: 1.8 ng/mL (ref 0.3–4.0)
Total CK: 41 U/L (ref 7–177)

## 2010-12-14 LAB — HEPARIN LEVEL (UNFRACTIONATED): Heparin Unfractionated: 0.13 IU/mL — ABNORMAL LOW (ref 0.30–0.70)

## 2010-12-14 LAB — POCT ACTIVATED CLOTTING TIME: Activated Clotting Time: 144 seconds

## 2010-12-14 NOTE — H&P (Addendum)
Bianca [name], TSE NO.:  [id number]  MEDICAL RECORD NO.:  [id number]  LOCATION:  [facility]                         FACILITY:  [facility]  PHYSICIAN:  Cassell Clement, M.D. DATE OF BIRTH:  [birth year]  DATE OF ADMISSION:  12/14/2010 DATE OF DISCHARGE:                             HISTORY & PHYSICAL   PRIMARY CARDIOLOGIST:  Rollene Rotunda, MD, [facility]  PRIMARY MEDICAL DOCTOR:  [facility].  CHIEF COMPLAINT:  Chest pain.  HISTORY OF PRESENT ILLNESS:  [name] is a 77 year old female, non- English speaking [place] lady, with history of CAD status post CABG in 2008. Her last catheterization was in 09/2006, when she suffered an NSTEMI one month after her bypass surgery with catheterization showing occluded vein graft to the diagonal and subtotally occluded Y-graft to the right PDA, for medical therapy.  She also has history of hypertension and diabetes mellitus.  Translation was utilized via the telephone translation system for the patient here in the ER.  She is admitted to the hospital with chest pain.  It started yesterday afternoon while riding in car, and has been intermittent since then.  It is the tearing/pressure-like pain that comes and goes.  It is not made worse by anything.  She has had some dizziness associated with this.  She has tried Tylenol with no relief.  Sublingual nitroglycerin at 5:00 a.m. helped a little bit.  The pain continues to be in and out. She generally overall feels very fatigued, and has had some nausea without vomiting as well.  She told Dr. Ethelda Chick that it reminded her of her prior heart pain, the patient is unclear on this to [place].  Cardiac enzymes are negative x1.  The EKG is with similar changes as before. She is currently on a nitro drip and received morphine with relief of her pain.  PAST MEDICAL HISTORY: 1. Coronary artery disease.     a.     Status post CABG x1 in April 2008 having had an MI at that      time.     b.     Status post NSTEMI in May 2008 with occluded vein graft to      the diagonal and subtotally occluded Y-graft to the right PDA, for      medical therapy.  There was some statement that the patient had      hemoptysis at that time which is why medical therapy was elected. 2. Diabetes mellitus. 3. Hypertension. 4. GERD. 5. Chronic renal insufficiency with baseline creatinine 1.3, stage     III. 6. Hyperlipidemia. 7. Hepatitis B. 8. Cervical dysplasia status post hysterectomy. 9. Positive PPD in 2008 with normal chest x-ray reportedly at that     time. 10.Spinal stenosis status post decompression. 11.History of multidrug resistant UTIs. 12.Carotid bruit with negative carotid Dopplers, August 2011. 13.LVEF of 50-55% in 2008. 14.Mild anemia and thrombocytopenia over the last year.  MEDICATIONS:  The patient brings in a bag of per medicines which do not include Lantus which is on her previous list.  We will ask pharmacy to clarify.  Otherwise include: 1. Metformin 500 mg two tabs b.i.d. 2. Cozaar 50 mg  daily.3. Coreg 12.5 mg two times b.i.d.  Unclear if the patient is actually     taking her Coreg as she brings in three bottles that are relatively     full of pills.  However, she does endorse taking her medication. 4. Plavix 75 mg daily. 5. Aspirin 81 mg daily. 6. Nitroglycerin sublingual 0.4 mg every 5 minutes as needed up to 3     doses for chest pain. 7. Crestor 20 mg daily. 8. Pepcid 20 mg b.i.d. 9. Promethazine 12.5 mg p.r.n.  She received morphine and nitroglycerin drip and 40 mEq of potassium in the ER.  ALLERGIES:  No known drug allergies.  She has had a cough with an ACE INHIBITOR.  SOCIAL HISTORY:  The patient lives in [place].  Her primary contact is her son, Selena Batten [phone], who is English speaking.  She is retired. She does not have a history of smoking cigarettes and does not drink.  FAMILY HISTORY:  Unknown per the patient.  REVIEW OF SYSTEMS:  She  does endorse some occasional dizziness as well as nausea.  All other systems reviewed and otherwise negative.  LABORATORY DATA:  WBC 5.4, hemoglobin 11.1, hematocrit 32.6, platelet count 190.  Sodium 138, potassium 3.2, chloride 101, CO2 is 25, glucose 168, BUN 23, creatinine 1.23.  Cardiac enzymes are negative x1.  EKG shows normal sinus rhythm with Q-waves in II, III, and aVF and borderline T-waves in V5-V6 with similar to January 2012, although with some T-wave flattening.  RADIOLOGIC STUDIES:  Chest x-ray showed no active cardiopulmonary disease.  PHYSICAL EXAMINATION:  VITAL SIGNS:  Temperature 97.1, pulse 68, respirations 18, blood pressure 171/85 with most recent BP 113/58, pulse ox 100% on 2 liters. GENERAL:  This is a pleasant Asian elderly woman, in no acute distress. HEENT:  Head is normocephalic, atraumatic with extraocular movements intact.  Clear sclerae.  Nares are without discharge. NECK:  Supple without carotid bruit. HEART:  Auscultation of the heart reveals regular rate and rhythm with S1 and S2 without murmurs, rubs, or gallops. LUNGS:  Clear to auscultation bilaterally without wheezes, rales, or rhonchi. ABDOMEN:  Soft, nontender, and nondistended with positive bowel sounds. No areas of pulsations.  No rebound or guarding. EXTREMITIES:  Warm, dry, and without edema.  She has 2+ pedal pulses which equal bilaterally.  Radial pulses are also equal bilaterally. NEUROLOGIC:  She is alert and oriented x3, responds to questions appropriately with the use of the [place] translator, although it is somewhat tangential at times.  ASSESSMENT/PLAN:  The patient was seen and examined by Dr. Patty Sermons and myself.  This is a 77 year old [place] lady with a history of known prior coronary artery disease status post coronary artery bypass graft, diabetes mellitus, and mild chronic renal insufficiency who presents with recurrent chest pain.  Pain may be similar to her prior  heart pain but is difficult to tell secondary to translation.  However, there are concerning factors including her nausea and continued intermittent discomfort.  It was partially relieved by nitroglycerin and then mostly relieved here in the ER with IV nitroglycerin, although she continues to have intermittent grade 2 pain.  EKG shows no acute changes.  We would Bianca [name] cardiac catheterization given that her last cath was 4 years ago. The patient prefers to wait her son's arrival to explain the procedure further to the patient.  This was discussed in depth with the translation services via the translator phone, but the patient prefers to have a family member present  which is understandable.  We have asked the ER to page [place] when he is available, and the patient's son is unaware and plans to come up to the ER.     Ronie Spies, P.A.C.   ______________________________ Cassell Clement, M.D.    DD/MEDQ  D:  12/14/2010  T:  12/14/2010  Job:  [id number]  cc:   Rollene Rotunda, MD, [facility]  Electronically Signed by Ronie Spies  on 12/14/2010 01:21:47 PM Electronically Signed by Cassell Clement M.D. on 12/14/2010 02:35:46 PM

## 2010-12-15 DIAGNOSIS — I200 Unstable angina: Secondary | ICD-10-CM

## 2010-12-15 LAB — CARDIAC PANEL(CRET KIN+CKTOT+MB+TROPI)
CK, MB: 1.7 ng/mL (ref 0.3–4.0)
Relative Index: INVALID (ref 0.0–2.5)
Total CK: 34 U/L (ref 7–177)
Troponin I: <0.3 ng/mL (ref <0.30)

## 2010-12-15 LAB — CBC
HCT: 29.2 % — ABNORMAL LOW (ref 36.0–46.0)
Hemoglobin: 9.7 g/dL — ABNORMAL LOW (ref 12.0–15.0)
MCH: 29.2 pg (ref 26.0–34.0)
MCHC: 33.2 g/dL (ref 30.0–36.0)
RBC: 3.32 MIL/uL — ABNORMAL LOW (ref 3.87–5.11)

## 2010-12-15 LAB — BASIC METABOLIC PANEL
CO2: 22 mEq/L (ref 19–32)
Calcium: 8.5 mg/dL (ref 8.4–10.5)
Chloride: 105 mEq/L (ref 96–112)
Glucose, Bld: 192 mg/dL — ABNORMAL HIGH (ref 70–99)
Sodium: 137 mEq/L (ref 135–145)

## 2010-12-15 LAB — GLUCOSE, CAPILLARY
Glucose-Capillary: 132 mg/dL — ABNORMAL HIGH (ref 70–99)
Glucose-Capillary: 143 mg/dL — ABNORMAL HIGH (ref 70–99)

## 2010-12-15 LAB — HEPARIN LEVEL (UNFRACTIONATED): Heparin Unfractionated: <0.1 IU/mL — ABNORMAL LOW (ref 0.30–0.70)

## 2010-12-16 LAB — CBC
HCT: 31.4 % — ABNORMAL LOW (ref 36.0–46.0)
Hemoglobin: 10.3 g/dL — ABNORMAL LOW (ref 12.0–15.0)
MCH: 28.7 pg (ref 26.0–34.0)
MCHC: 32.8 g/dL (ref 30.0–36.0)
MCV: 87.5 fL (ref 78.0–100.0)
RDW: 14.2 % (ref 11.5–15.5)

## 2010-12-22 ENCOUNTER — Telehealth: Payer: Self-pay | Admitting: *Deleted

## 2010-12-22 ENCOUNTER — Telehealth: Payer: Self-pay | Admitting: Cardiology

## 2010-12-22 NOTE — Telephone Encounter (Signed)
Daughter calls to ask for appt for mother, pt recently disch from hosp/ cardiology- dr hochrein. States pt has been weak, dizzy, very sleepy since disch and seems worse since 8/20. Daughter was ask to take pt to ED, she would like appt in clinic. Dr Coralee Pesa spoke to daughter and explained that pt may be too sick at this point and may need to be in emergency setting and that office visit may delay emergent care. She agrees to take pt to ED. Dr Coralee Pesa instructed triage nurse to call ED and make chg nurse aware of pt being in route.  Daughter calls back and states pt tells via ph that she feels much better today and does not want to go to ED. Daughter is ask to call dr hochrein's office and speak w/ triage for possible eval in his office today or possible clinic appt on his recommendation, she is given his office# and instructed to call clinic back with plan for pt's care.

## 2010-12-22 NOTE — Telephone Encounter (Signed)
Pt's dtr calling re pt having dizziness and fatigue, worse yesterday though

## 2010-12-22 NOTE — Telephone Encounter (Signed)
Agree, as noted

## 2010-12-22 NOTE — Cardiovascular Report (Signed)
NAMEYOUSRA, IVENS                  ACCOUNT NO.:  [id number]  MEDICAL RECORD NO.:  [id number]  LOCATION:                                 FACILITY:  PHYSICIAN:  Noralyn Pick. Eden Emms, MD, FACCDATE OF BIRTH:  05/14/1930  DATE OF PROCEDURE: DATE OF DISCHARGE:                           CARDIAC CATHETERIZATION   PROCEDURE:  Coronary arteriography.  OPERATOR:  Noralyn Pick. Eden Emms, MD, [facility]  INDICATIONS:  Known coronary artery disease with previous bypass surgery.  Last cath in July 2011 showed an aneurysmal graft with poor runoff to an obtuse marginal branch and an occlusion of the vein graft to the diagonal branch.  The Y-portion of the vein graft to distal RCA which was supposed to anastomose with the PDA was also occluded.  The patient was admitted with chest pain.  History is somewhat difficult as it is done through a [place] interpreter.  Cine catheterization was done from the right femoral artery as before. It was impossible to selectively engage the LIMA due to unusual anatomy. However, it was well visualized with nonselective shots of the left subclavian  Left main coronary artery was normal.  Left anterior descending artery was 100% occluded proximally.  Circumflex coronary artery was nondominant.  First obtuse marginal branch was small with 30-40% diffuse disease.  The second obtuse marginal branch was larger and had an 80% long tubular area of disease proximally.  There was a 60% focal area of disease distal to the old anastomotic site, which tented the artery somewhat.  The right coronary artery was 100% occluded proximally.  The saphenous vein graft to the right coronary artery was patent to the distal RCA.  The Y-graft area was 100% occluded and noted previously. The PDA filled from left-to-right collaterals.  The saphenous vein graft to diagonal branch was known to be occluded and was flush occluded again.  The saphenous vein graft to the second obtuse marginal  branch was 100% occluded.  This was a new finding since previous study.  The LIMA to the LAD was widely patent with the distal LAD supplying collaterals to the PDA.  The patient was somewhat hypertensive during the case with blood pressure as high as 195/105.  She was treated with increasing doses of IV nitro and hydralazine.  For the majority of the case, aortic pressure is 185/84, but did come down to approximately 160 systolic by the end of the case.  IMPRESSION:  The films were reviewed with Dr. Clifton James.  The only area that could be revascularized percutaneously would be the OM branch.  It is a small vessel.  We both thought initial attempt at medical therapy would be warranted particularly since her blood pressure seems to be poorly controlled.  We will keep her nitro on until morning.  We will add hydralazine and further up-titrate her medicines to get better control of her heart rate and blood pressure.  As indicated by Dr. Clifton James if she continues to have chest pain that is not amenable to medical therapy, the proximal portion of that OM can probably be angioplastied.     Noralyn Pick. Eden Emms, MD, [facility]     PCN/MEDQ  D:  12/14/2010  T:  12/15/2010  Job:  [id number]  Electronically Signed by Charlton Haws MD [place] on 12/22/2010 09:23:52 AM

## 2010-12-22 NOTE — Telephone Encounter (Signed)
Unable to reach today - will continue to attempt to call tomorrow.  Lm for Bianca [name] to call back.  Pt is scheduled for an upcoming appt

## 2010-12-23 NOTE — Telephone Encounter (Signed)
NA  Will call back in the AM

## 2010-12-23 NOTE — Telephone Encounter (Signed)
Pt daughter calling back pt mom still having dizziness, fatigue, also stomach pain this am. Patient daughter states she would like a call back today

## 2010-12-24 NOTE — Telephone Encounter (Signed)
Per daughter - pt has been having chest "discomfort" that has woke her from her sleep around 4 am for the past 2 mornings.  She reports feeling fine now.  Discomfort resolves by itself.  She also complains of H/A, dizziness and daughter reports she is sleeping a lot.  She also has "stomach pain." instructed daughter that I will discuss with Dr Antoine Poche and call her back this afternoon.

## 2010-12-24 NOTE — Telephone Encounter (Signed)
NA - will continue to attempt to call

## 2010-12-24 NOTE — Telephone Encounter (Signed)
CALLED MARIE TO SCHEDULE APPT FOR PT.  LEFT MESSAGE FOR HER TO CALL BACK TO SCHEDULE.

## 2010-12-24 NOTE — Telephone Encounter (Signed)
The patient should be scheduled with a follow up.  She had a recent cath and medical management was suggested.

## 2011-01-05 ENCOUNTER — Encounter: Payer: Medicare Other | Admitting: Physician Assistant

## 2011-01-05 ENCOUNTER — Encounter: Payer: Self-pay | Admitting: Physician Assistant

## 2011-01-05 ENCOUNTER — Ambulatory Visit (INDEPENDENT_AMBULATORY_CARE_PROVIDER_SITE_OTHER): Payer: Medicare Other | Admitting: Physician Assistant

## 2011-01-05 DIAGNOSIS — R079 Chest pain, unspecified: Secondary | ICD-10-CM

## 2011-01-05 DIAGNOSIS — R42 Dizziness and giddiness: Secondary | ICD-10-CM | POA: Insufficient documentation

## 2011-01-05 DIAGNOSIS — I10 Essential (primary) hypertension: Secondary | ICD-10-CM

## 2011-01-05 DIAGNOSIS — I2510 Atherosclerotic heart disease of native coronary artery without angina pectoris: Secondary | ICD-10-CM

## 2011-01-05 MED ORDER — ISOSORBIDE MONONITRATE ER 30 MG PO TB24: 30.0000 mg | ORAL_TABLET | Freq: Every day | ORAL | Status: DC

## 2011-01-05 NOTE — Patient Instructions (Addendum)
Your physician recommends that you schedule a follow-up appointment in: 1 week for BP check and 2 weeks with Dr Antoine Poche or Tereso Newcomer, PA-C   Your physician has recommended you make the following change in your medication: STOP Hydralazine and START Isosorbide 30 mg daily  Your physician recommends that you return for lab work in: BMP & CBC today

## 2011-01-05 NOTE — Assessment & Plan Note (Signed)
Adjust meds as noted.  Stop Hydralazine due to side effects.  Consider amlodipine as noted.

## 2011-01-05 NOTE — Assessment & Plan Note (Signed)
Adjust meds as noted.  Check orthostatic vital signs.  If she has a significant BP drop with standing, I will have her get compression stockings.  Check CBC and BMET today as well.

## 2011-01-05 NOTE — Progress Notes (Signed)
History of Present Illness: Primary Cardiologist:  Dr. Rollene Rotunda  Bianca [name] is a 77 y.o. female who presents for post hospital follow up.  She has a history of CAD, status post CABG, hypertension, hyperlipidemia, diabetes mellitus, GERD, chronic kidney disease stage III and hepatitis B.  Echocardiogram 5/08: Mid to distal inferior septal hypokinesis, inferior hypokinesis, EF 50-55%, normal wall thickness.  Carotids 10/11: No ICA stenosis bilaterally.  She was admitted 8/13-8/15.  She presented with chest pain.  She ruled out for myocardial infarction.  Cardiac catheterization performed 12/15/10 via the right femoral artery: Left main patent, proximal LAD occluded, OM1 30-40%, proximal OM2 80% and 60% after the anastomosis, proximal RCA occluded, SVG-DX occluded (old), SVG-OM 2 occluded (new), LIMA-LAD patent, distal LAD provided collaterals to the PDA.  Medical therapy was recommended.  It was felt that the proximal OM 2 could be treated with PCI if the patient failed medical therapy.  She did have significant blood pressure issues and her medications were adjusted.  Labs: Hemoglobin 10.3, platelet count 126,000, potassium 3.9, BUN 20, creatinine 1.23, ALT 12, cardiac enzymes negative x4.  Chest x-ray was unremarkable.  The patient called in recently with complaints of chest pain, headaches, dizziness, fatigue stomach pain.  Here with an interpreter.  She has had chest tightness since d/c from the hospital.  She notes increased symptoms with exertion.  Also notes DOE, class 2b.  No orthopnea, PND or significant edema.  She denies syncope.  Notes dizziness and nausea with each dose of hydralazine.  Also feels lightheadedness with standing.    Past Medical History  Diagnosis Date  . Diabetes mellitus, type 2   . GERD (gastroesophageal reflux disease)   . Hepatitis B   . Hyperlipemia   . Hypertension   . Cervical dysplasia   . Atrophic vaginitis   . Spinal stenosis     s/p decompression  .  CAD (coronary artery disease) April 2008    s/p CABG;    cath 8/12:  LM patent, pLAD occluded, OM1 30-40%, pOM2 80% and 60% after the anastomosis, pRCA occluded, S-DX occluded (old), S-OM 2 occluded (new), L-LAD patent, dLAD provided collats to the PDA; Med Rx  rec.; consider PCI to OM2 if fails med Rx    Current Outpatient Prescriptions  Medication Sig Dispense Refill  . ALBUTEROL IN Inhale into the lungs.        Marland Kitchen aspirin 81 MG tablet Take 81 mg by mouth daily.        . carvedilol (COREG) 25 MG tablet Take 25 mg by mouth. 1 by mouth twice daily       . CRESTOR 20 MG tablet TAKE 1 TABLET BY MOUTH EVERY DAY  30 tablet  8  . famotidine (PEPCID) 20 MG tablet Take 1 tablet (20 mg total) by mouth 2 (two) times daily.  60 tablet  4  . hydrALAZINE (APRESOLINE) 25 MG tablet Take 25 mg by mouth 3 (three) times daily.        . hydrochlorothiazide 25 MG tablet Take 25 mg by mouth daily.        . insulin glargine (LANTUS) 100 UNIT/ML injection Inject 55 Units into the skin at bedtime.        Marland Kitchen losartan (COZAAR) 50 MG tablet TAKE 1 TABLET BY MOUTH ONCE A DAY  30 tablet  4  . metFORMIN (GLUCOPHAGE) 500 MG tablet Take 500 mg by mouth. 2 by mouth twice daily       .  Multiple Vitamin (MULTIVITAMIN) tablet Take 1 tablet by mouth daily.        . nitroGLYCERIN (NITROSTAT) 0.4 MG SL tablet Place 0.4 mg under the tongue every 5 (five) minutes as needed. Maximum of 3 doses       . PLAVIX 75 MG tablet TAKE 1 TABLET BY MOUTH ONCE A DAY  31 tablet  10  . glucose blood test strip Use as instructed  100 each  12  . HYDROcodone-acetaminophen (VICODIN) 5-500 MG per tablet Take 1 tablet by mouth every 4 (four) hours as needed for pain.  60 tablet  0  . Insulin Syringe-Needle U-100 (SB INSULIN SYRINGE) 31G X 5/16" 1 ML MISC Use to inject Lantus once daily       . Lancets (ONETOUCH ULTRASOFT) lancets Use as instructed  100 each  11  . promethazine (PHENERGAN) 12.5 MG tablet TAKE 1 TABLET BY MOUTH EVERY 4-6 HOURS AS NEEDED  FOR NAUSEA/VOMITING  30 tablet  2    Allergies: No Known Allergies  Vital Signs: BP 140/66  Pulse 73  Ht 5' (1.524 m)  Wt 104 lb 1.9 oz (47.229 kg)  BMI 20.33 kg/m2  Filed Vitals:   01/05/11 1556 01/05/11 1600 01/05/11 1601 01/05/11 1602  BP: 134/78 145/77 154/81 156/82  Pulse: 68 73 72 76  Height:      Weight:         PHYSICAL EXAM: Well nourished, well developed, in no acute distress HEENT: normal Neck: no JVD Cardiac:  normal S1, S2; RRR; no murmur Lungs:  clear to auscultation bilaterally, no wheezing, rhonchi or rales Abd: soft, nontender, no hepatomegaly Ext: no edema; RFA site without hematoma or bruit Skin: warm and dry Neuro:  CNs 2-12 intact, no focal abnormalities noted  EKG:  NSR, HR 73, normal axis, NSSTTW changes  ASSESSMENT AND PLAN:

## 2011-01-05 NOTE — Assessment & Plan Note (Signed)
Adjust meds as noted.  Continue ASA and Plavix.  If she fails medical Rx, consider PCI of OM2.  Follow up in 2 weeks as noted.

## 2011-01-05 NOTE — Assessment & Plan Note (Signed)
She is still having angina.  She is not on a long acting Nitrate.  I will start her on Imdur 30 mg QD.  She is having significant side effects to Hydralazine.  I will have her stop this.  She will be brought back in one week with the RN to check her BP.  If it is elevated, I would suggest starting amlodipine to see if she can tolerate it as it would also serve as an antianginal.  She will be brought back for follow up with Dr. Antoine Poche or me in 2 weeks.

## 2011-01-06 LAB — BASIC METABOLIC PANEL
BUN: 27 mg/dL — ABNORMAL HIGH (ref 6–23)
CO2: 26 mEq/L (ref 19–32)
Glucose, Bld: 156 mg/dL — ABNORMAL HIGH (ref 70–99)
Sodium: 137 mEq/L (ref 135–145)

## 2011-01-06 LAB — CBC WITH DIFFERENTIAL/PLATELET
Basophils Relative: 0.1 % (ref 0.0–3.0)
Eosinophils Relative: 2.7 % (ref 0.0–5.0)
HCT: 30 % — ABNORMAL LOW (ref 36.0–46.0)
Lymphs Abs: 0.5 10*3/uL — ABNORMAL LOW (ref 0.7–4.0)
MCV: 89.3 fl (ref 78.0–100.0)
Monocytes Absolute: 0.4 10*3/uL (ref 0.1–1.0)
Neutrophils Relative %: 78.6 % — ABNORMAL HIGH (ref 43.0–77.0)
RBC: 3.36 Mil/uL — ABNORMAL LOW (ref 3.87–5.11)
WBC: 4.6 10*3/uL (ref 4.5–10.5)

## 2011-01-07 ENCOUNTER — Encounter: Payer: Self-pay | Admitting: Cardiology

## 2011-01-07 NOTE — Telephone Encounter (Signed)
error 

## 2011-01-08 ENCOUNTER — Other Ambulatory Visit: Payer: Self-pay | Admitting: *Deleted

## 2011-01-08 ENCOUNTER — Other Ambulatory Visit: Payer: Self-pay | Admitting: Physician Assistant

## 2011-01-08 DIAGNOSIS — I10 Essential (primary) hypertension: Secondary | ICD-10-CM

## 2011-01-08 MED ORDER — AMLODIPINE BESYLATE 5 MG PO TABS: 5.0000 mg | ORAL_TABLET | Freq: Every day | ORAL | Status: DC

## 2011-01-13 ENCOUNTER — Other Ambulatory Visit (INDEPENDENT_AMBULATORY_CARE_PROVIDER_SITE_OTHER): Payer: Medicare Other | Admitting: *Deleted

## 2011-01-13 ENCOUNTER — Ambulatory Visit (INDEPENDENT_AMBULATORY_CARE_PROVIDER_SITE_OTHER): Payer: Medicare Other

## 2011-01-13 VITALS — BP 124/60 | HR 64 | Wt 106.5 lb

## 2011-01-13 DIAGNOSIS — I10 Essential (primary) hypertension: Secondary | ICD-10-CM

## 2011-01-13 LAB — BASIC METABOLIC PANEL
BUN: 34 mg/dL — ABNORMAL HIGH (ref 6–23)
CO2: 23 mEq/L (ref 19–32)
GFR: 27.35 mL/min — ABNORMAL LOW (ref >60.00)
Glucose, Bld: 88 mg/dL (ref 70–99)
Potassium: 4.4 mEq/L (ref 3.5–5.1)

## 2011-01-13 NOTE — Progress Notes (Signed)
The patient is here today to followup on her BP. Hydralazine was discontinued on 01/05/11 and she was started on Isosorbide MN 30mg  once daily. She is taking this as prescribed. She did not bring a list of her current medications, therefore these could not be verified today. She does have some complaint of headache and she has been instructed that she may use Tylenol as needed for this. She will have a repeat BMP today as per Tereso Newcomer, PA. She has been reminded to keep her already scheduled followup with Tereso Newcomer, PA on 01/19/11.

## 2011-01-13 NOTE — Patient Instructions (Signed)
Your physician recommends that you continue on your current medications as directed. Please refer to the Current Medication list given to you today.  Please keep your  follow-up appointment on: Tuesday 01/19/11 @ 10:30am with Tereso Newcomer, PA.

## 2011-01-14 ENCOUNTER — Ambulatory Visit: Payer: Medicare Other | Admitting: Cardiology

## 2011-01-19 ENCOUNTER — Encounter: Payer: Self-pay | Admitting: Physician Assistant

## 2011-01-19 ENCOUNTER — Ambulatory Visit (INDEPENDENT_AMBULATORY_CARE_PROVIDER_SITE_OTHER): Payer: Medicare Other | Admitting: Physician Assistant

## 2011-01-19 VITALS — BP 130/64 | HR 64 | Resp 18 | Ht 61.0 in | Wt 104.1 lb

## 2011-01-19 DIAGNOSIS — N289 Disorder of kidney and ureter, unspecified: Secondary | ICD-10-CM

## 2011-01-19 DIAGNOSIS — I2510 Atherosclerotic heart disease of native coronary artery without angina pectoris: Secondary | ICD-10-CM

## 2011-01-19 DIAGNOSIS — E119 Type 2 diabetes mellitus without complications: Secondary | ICD-10-CM

## 2011-01-19 DIAGNOSIS — I10 Essential (primary) hypertension: Secondary | ICD-10-CM

## 2011-01-19 LAB — BASIC METABOLIC PANEL
BUN: 22 mg/dL (ref 6–23)
CO2: 23 mEq/L (ref 19–32)
Calcium: 9.2 mg/dL (ref 8.4–10.5)
Creatinine, Ser: 1.6 mg/dL — ABNORMAL HIGH (ref 0.4–1.2)

## 2011-01-19 MED ORDER — AMLODIPINE BESYLATE 5 MG PO TABS: 10.0000 mg | ORAL_TABLET | Freq: Every day | ORAL | Status: DC

## 2011-01-19 NOTE — Assessment & Plan Note (Signed)
As noted, losartan will be discontinued.  Check a basic metabolic panel and repeat this in one week.

## 2011-01-19 NOTE — Progress Notes (Signed)
History of Present Illness: Primary Cardiologist:  Dr. Rollene Rotunda  [name] is a 77 y.o. female who presents for follow up.  She has a history of CAD, status post CABG, hypertension, hyperlipidemia, diabetes mellitus, GERD, chronic kidney disease stage III and hepatitis B.  Echocardiogram 5/08: Mid to distal inferior septal hypokinesis, inferior hypokinesis, EF 50-55%, normal wall thickness.  Carotids 10/11: No ICA stenosis bilaterally.  She was admitted 8/13-8/15 with chest pain and MI was ruled out.  Cath performed 12/15/10: LM patent, pLAD occluded, OM1 30-40%, pOM2 80% and 60% after the anastomosis, pRCA occluded, SVG-DX occluded (old), SVG-OM 2 occluded (new), LIMA-LAD patent, dLAD provided collaterals to the PDA.  Medical therapy was recommended.  It was felt that the pOM2 could be treated with PCI if the patient failed medical therapy.  She did have significant blood pressure issues and her medications were adjusted.    I saw her on 9/4 in post hospital followup.  She described chest tightness with exertion as well as dyspnea with exertion.  She had side effects from hydralazine.  I discontinue the hydralazine.  I placed her on isosorbide.  Followup lab work demonstrated a potassium of 4.1 and a creatinine of 1.8.  In light of this, her Cozaar and HCTZ were both discontinued and she was placed on amlodipine 5 mg a day.  Hemoglobin was 10 and this is stable.  Repeat labs 9/12: Potassium 4.4, creatinine 1.9, BUN 34.  She returns for followup.  She feels much better.  She denies any further headaches.  She denies any chest pain.  She denies exertional chest discomfort.  She denies shortness of breath.  She denies syncope.  She denies orthopnea, PND or edema.  She did not get the instructions to stop her Cozaar.  She is still taking this medication.  Of note, she is also on metformin for diabetes.  Past Medical History  Diagnosis Date  . Diabetes mellitus, type 2   . GERD (gastroesophageal  reflux disease)   . Hepatitis B   . Hyperlipemia   . Hypertension   . Cervical dysplasia   . Atrophic vaginitis   . Spinal stenosis     s/p decompression  . CAD (coronary artery disease) April 2008    s/p CABG;    cath 8/12:  LM patent, pLAD occluded, OM1 30-40%, pOM2 80% and 60% after the anastomosis, pRCA occluded, S-DX occluded (old), S-OM 2 occluded (new), L-LAD patent, dLAD provided collats to the PDA; Med Rx  rec.; consider PCI to OM2 if fails med Rx    Current Outpatient Prescriptions  Medication Sig Dispense Refill  . ALBUTEROL IN Inhale into the lungs.        Marland Kitchen amLODipine (NORVASC) 5 MG tablet Take 1 tablet (5 mg total) by mouth daily.  30 tablet  11  . aspirin 325 MG EC tablet Take 325 mg by mouth daily.        . carvedilol (COREG) 25 MG tablet Take 25 mg by mouth 2 (two) times daily with a meal.        . CRESTOR 20 MG tablet TAKE 1 TABLET BY MOUTH EVERY DAY  30 tablet  8  . famotidine (PEPCID) 20 MG tablet Take 1 tablet (20 mg total) by mouth 2 (two) times daily.  60 tablet  4  . glucose blood test strip Use as instructed  100 each  12  . insulin glargine (LANTUS) 100 UNIT/ML injection Inject 55 Units into the skin at  bedtime.        . Insulin Syringe-Needle U-100 (SB INSULIN SYRINGE) 31G X 5/16" 1 ML MISC Use to inject Lantus once daily       . isosorbide mononitrate (IMDUR) 30 MG 24 hr tablet Take 1 tablet (30 mg total) by mouth daily.  30 tablet  6  . Lancets (ONETOUCH ULTRASOFT) lancets Use as instructed  100 each  11  . losartan (COZAAR) 50 MG tablet Take 50 mg by mouth daily.        . metFORMIN (GLUCOPHAGE) 500 MG tablet Take 500 mg by mouth. 2 by mouth twice daily       . nitroGLYCERIN (NITROSTAT) 0.4 MG SL tablet Place 0.4 mg under the tongue every 5 (five) minutes as needed. Maximum of 3 doses       . PLAVIX 75 MG tablet TAKE 1 TABLET BY MOUTH ONCE A DAY  31 tablet  10  . promethazine (PHENERGAN) 12.5 MG tablet TAKE 1 TABLET BY MOUTH EVERY 4-6 HOURS AS NEEDED FOR  NAUSEA/VOMITING  30 tablet  2    Allergies: No Known Allergies  Vital Signs: BP 130/64  Pulse 64  Resp 18  Ht 5\' 1"  (1.549 m)  Wt 104 lb 1.9 oz (47.229 kg)  BMI 19.67 kg/m2  PHYSICAL EXAM: Well nourished, well developed, in no acute distress HEENT: normal Neck: no JVD Cardiac:  normal S1, S2; RRR; no murmur Lungs:  clear to auscultation bilaterally, no wheezing, rhonchi or rales Abd: soft, nontender, no hepatomegaly Ext: no edema Skin: warm and dry Neuro:  CNs 2-12 intact, no focal abnormalities noted  ASSESSMENT AND PLAN:

## 2011-01-19 NOTE — Assessment & Plan Note (Signed)
Better controlled.  However, she needs to stop taking her losartan.  Her creatinine has continued to increase.  I will have her increase her amlodipine to 10 mg a day.  Stop losartan.  Check a basic metabolic panel today.  Repeat a basic metabolic panel and blood pressure check in one week.

## 2011-01-19 NOTE — Patient Instructions (Signed)
Your physician recommends that you schedule a follow-up appointment in: DR. Antoine Poche IN 2 MONTHS PER SCOTT WEAVER PA-C  Your physician recommends that you return for lab work in: TODAY BMET 401.9 HTN, AND RENAL INSUFFICIENCY  Your physician recommends that you return for lab work in: 01/27/11 FOR REPEAT BMET HTN AND RENAL INSUFFICIENCY PT ALSO NEEDS SAME DAY NEEDS A NURSE VISIT FOR A BLOOD PRESSURE CHECK.   PER SCOTT WEAVER, PA-C YOU NEED TO FOLLOW UP WITH YOUR PRIMARY CARE PHYSICIAN TO DISCUSS IF YOU NEED TO STAY ON METFORMIN OR NOT.

## 2011-01-19 NOTE — Assessment & Plan Note (Signed)
As noted, her chest discomfort is resolved.  She will continue on aspirin, Plavix, carvedilol, amlodipine and Crestor.  Followup with Dr. Antoine Poche in 2 months.

## 2011-01-19 NOTE — Assessment & Plan Note (Signed)
Resolved.  She will continue on her current antianginal therapy.

## 2011-01-19 NOTE — Assessment & Plan Note (Signed)
With her elevated creatinine, she may not be a candidate for metformin any longer.  I have recommended that she followup with her PCP to discuss this further.

## 2011-01-25 ENCOUNTER — Other Ambulatory Visit: Payer: Self-pay | Admitting: Internal Medicine

## 2011-01-25 NOTE — Telephone Encounter (Signed)
I refilled for 1 month supply.  Please make an appointment for patient to be seen within 1 month to recheck renal function and consider alternatives to metformin (last creatinine was 1.6).

## 2011-01-27 ENCOUNTER — Other Ambulatory Visit (INDEPENDENT_AMBULATORY_CARE_PROVIDER_SITE_OTHER): Payer: Medicare Other | Admitting: *Deleted

## 2011-01-27 ENCOUNTER — Ambulatory Visit: Payer: Medicare Other

## 2011-01-27 VITALS — BP 132/60 | HR 68

## 2011-01-27 DIAGNOSIS — I2510 Atherosclerotic heart disease of native coronary artery without angina pectoris: Secondary | ICD-10-CM

## 2011-01-27 DIAGNOSIS — I10 Essential (primary) hypertension: Secondary | ICD-10-CM

## 2011-01-27 DIAGNOSIS — N289 Disorder of kidney and ureter, unspecified: Secondary | ICD-10-CM

## 2011-01-27 LAB — BASIC METABOLIC PANEL
BUN: 19 mg/dL (ref 6–23)
Calcium: 9.7 mg/dL (ref 8.4–10.5)
Creatinine, Ser: 1.6 mg/dL — ABNORMAL HIGH (ref 0.4–1.2)
GFR: 32.67 mL/min — ABNORMAL LOW (ref >60.00)
Glucose, Bld: 143 mg/dL — ABNORMAL HIGH (ref 70–99)
Sodium: 143 mEq/L (ref 135–145)

## 2011-02-02 LAB — CK TOTAL AND CKMB (NOT AT ARMC)
CK, MB: 1.2 ng/mL (ref 0.3–4.0)
CK, MB: 1.8 ng/mL (ref 0.3–4.0)
Relative Index: INVALID (ref 0.0–2.5)
Relative Index: INVALID (ref 0.0–2.5)
Total CK: 42 U/L (ref 7–177)
Total CK: 43 U/L (ref 7–177)
Total CK: 61 U/L (ref 7–177)

## 2011-02-02 LAB — CBC
HCT: 34.3 % — ABNORMAL LOW (ref 36.0–46.0)
MCV: 91.1 fL (ref 78.0–100.0)
Platelets: 154 10*3/uL (ref 150–400)
RDW: 13.4 % (ref 11.5–15.5)
WBC: 6.8 10*3/uL (ref 4.0–10.5)

## 2011-02-02 LAB — COMPREHENSIVE METABOLIC PANEL
AST: 24 U/L (ref 0–37)
Albumin: 3.1 g/dL — ABNORMAL LOW (ref 3.5–5.2)
BUN: 14 mg/dL (ref 6–23)
Chloride: 108 mEq/L (ref 96–112)
Creatinine, Ser: 0.99 mg/dL (ref 0.4–1.2)
GFR calc Af Amer: >60 mL/min (ref >60)
Total Protein: 5.8 g/dL — ABNORMAL LOW (ref 6.0–8.3)

## 2011-02-02 LAB — POCT I-STAT, CHEM 8
Calcium, Ion: 1.11 mmol/L — ABNORMAL LOW (ref 1.12–1.32)
Glucose, Bld: 213 mg/dL — ABNORMAL HIGH (ref 70–99)
HCT: 38 % (ref 36.0–46.0)
TCO2: 22 mmol/L (ref 0–100)

## 2011-02-02 LAB — URINE CULTURE: Colony Count: >=100000

## 2011-02-02 LAB — URINE MICROSCOPIC-ADD ON

## 2011-02-02 LAB — APTT: aPTT: 29 seconds (ref 24–37)

## 2011-02-02 LAB — GLUCOSE, CAPILLARY
Glucose-Capillary: 176 mg/dL — ABNORMAL HIGH (ref 70–99)
Glucose-Capillary: 224 mg/dL — ABNORMAL HIGH (ref 70–99)
Glucose-Capillary: 276 mg/dL — ABNORMAL HIGH (ref 70–99)
Glucose-Capillary: 292 — ABNORMAL HIGH

## 2011-02-02 LAB — URINALYSIS, ROUTINE W REFLEX MICROSCOPIC
Bilirubin Urine: NEGATIVE
Ketones, ur: NEGATIVE mg/dL
Leukocytes, UA: NEGATIVE
Nitrite: POSITIVE — AB
Protein, ur: 100 mg/dL — AB

## 2011-02-02 LAB — TSH: TSH: 0.783 u[IU]/mL (ref 0.350–4.500)

## 2011-02-02 LAB — D-DIMER, QUANTITATIVE: D-Dimer, Quant: 0.71 ug/mL-FEU — ABNORMAL HIGH (ref 0.00–0.48)

## 2011-02-02 LAB — TROPONIN I
Troponin I: 0.01 ng/mL (ref 0.00–0.06)
Troponin I: <0.01 ng/mL (ref 0.00–0.06)

## 2011-02-02 LAB — LIPASE, BLOOD: Lipase: 43 U/L (ref 11–59)

## 2011-02-02 LAB — PROTIME-INR: INR: 1 (ref 0.00–1.49)

## 2011-02-05 LAB — GLUCOSE, CAPILLARY
Glucose-Capillary: 221 mg/dL — ABNORMAL HIGH (ref 70–99)
Glucose-Capillary: 241 mg/dL — ABNORMAL HIGH (ref 70–99)
Glucose-Capillary: 273 mg/dL — ABNORMAL HIGH (ref 70–99)
Glucose-Capillary: 314 mg/dL — ABNORMAL HIGH (ref 70–99)

## 2011-02-05 LAB — URINALYSIS, DIPSTICK ONLY
Glucose, UA: >1000 mg/dL — AB
Leukocytes, UA: NEGATIVE
Nitrite: POSITIVE — AB
Specific Gravity, Urine: 1.019 (ref 1.005–1.030)
pH: 6 (ref 5.0–8.0)

## 2011-02-05 LAB — CBC
HCT: 37.5 % (ref 36.0–46.0)
Hemoglobin: 12.6 g/dL (ref 12.0–15.0)
MCV: 92.2 fL (ref 78.0–100.0)
Platelets: 156 10*3/uL (ref 150–400)
RDW: 13.2 % (ref 11.5–15.5)

## 2011-02-05 LAB — URINE CULTURE
Colony Count: >=100000
Special Requests: POSITIVE

## 2011-02-05 LAB — LIPID PANEL
Triglycerides: 211 mg/dL — ABNORMAL HIGH (ref <150)
VLDL: 42 mg/dL — ABNORMAL HIGH (ref 0–40)

## 2011-02-07 ENCOUNTER — Emergency Department (HOSPITAL_COMMUNITY)
Admission: EM | Admit: 2011-02-07 | Discharge: 2011-02-07 | Disposition: A | Discharge status: Home / Self Care | Payer: Medicare Other | Attending: Emergency Medicine | Admitting: Emergency Medicine

## 2011-02-07 ENCOUNTER — Encounter: Payer: Self-pay | Admitting: Internal Medicine

## 2011-02-07 ENCOUNTER — Emergency Department (HOSPITAL_COMMUNITY): Payer: Medicare Other

## 2011-02-07 DIAGNOSIS — Z7982 Long term (current) use of aspirin: Secondary | ICD-10-CM | POA: Insufficient documentation

## 2011-02-07 DIAGNOSIS — I10 Essential (primary) hypertension: Secondary | ICD-10-CM | POA: Insufficient documentation

## 2011-02-07 DIAGNOSIS — R079 Chest pain, unspecified: Secondary | ICD-10-CM | POA: Insufficient documentation

## 2011-02-07 DIAGNOSIS — Z79899 Other long term (current) drug therapy: Secondary | ICD-10-CM | POA: Insufficient documentation

## 2011-02-07 DIAGNOSIS — E785 Hyperlipidemia, unspecified: Secondary | ICD-10-CM | POA: Insufficient documentation

## 2011-02-07 DIAGNOSIS — R109 Unspecified abdominal pain: Secondary | ICD-10-CM | POA: Insufficient documentation

## 2011-02-07 DIAGNOSIS — R1110 Vomiting, unspecified: Secondary | ICD-10-CM | POA: Insufficient documentation

## 2011-02-07 DIAGNOSIS — I2510 Atherosclerotic heart disease of native coronary artery without angina pectoris: Secondary | ICD-10-CM | POA: Insufficient documentation

## 2011-02-07 DIAGNOSIS — E119 Type 2 diabetes mellitus without complications: Secondary | ICD-10-CM | POA: Insufficient documentation

## 2011-02-07 LAB — DIFFERENTIAL
Eosinophils Absolute: 0.1 10*3/uL (ref 0.0–0.7)
Eosinophils Relative: 1 % (ref 0–5)
Lymphocytes Relative: 10 % — ABNORMAL LOW (ref 12–46)
Lymphs Abs: 0.8 10*3/uL (ref 0.7–4.0)
Monocytes Relative: 9 % (ref 3–12)
Neutrophils Relative %: 80 % — ABNORMAL HIGH (ref 43–77)

## 2011-02-07 LAB — CBC
HCT: 28.4 % — ABNORMAL LOW (ref 36.0–46.0)
MCH: 28.9 pg (ref 26.0–34.0)
MCV: 87.4 fL (ref 78.0–100.0)
Platelets: 126 10*3/uL — ABNORMAL LOW (ref 150–400)
RBC: 3.25 MIL/uL — ABNORMAL LOW (ref 3.87–5.11)
WBC: 8.2 10*3/uL (ref 4.0–10.5)

## 2011-02-07 LAB — BASIC METABOLIC PANEL
BUN: 16 mg/dL (ref 6–23)
CO2: 22 mEq/L (ref 19–32)
Glucose, Bld: 156 mg/dL — ABNORMAL HIGH (ref 70–99)
Potassium: 3.5 mEq/L (ref 3.5–5.1)
Sodium: 141 mEq/L (ref 135–145)

## 2011-02-07 LAB — POCT I-STAT TROPONIN I

## 2011-02-07 LAB — HEPATIC FUNCTION PANEL
ALT: 28 U/L (ref 0–35)
AST: 35 U/L (ref 0–37)
Albumin: 3.9 g/dL (ref 3.5–5.2)
Alkaline Phosphatase: 50 U/L (ref 39–117)
Bilirubin, Direct: 0.1 mg/dL (ref 0.0–0.3)
Total Bilirubin: 0.6 mg/dL (ref 0.3–1.2)

## 2011-02-07 NOTE — Progress Notes (Signed)
77 y/o woman with PMH significant for CAD s/p CABG, HTN, GERD, DM II, Hyperlipdemia with chief complaint of abdominal discomfort and burning for two days.  She reported experiencing strong supra-umbilical/sub-epigastric burning pain two evenings ago with one episode of vomiting that brought up non-bilious, non-bloody, clear/white fluid.  She notes that her abdominal pain has slowly been decreasing in intensity since onset and that she currently only is experiencing mild discomfort now.  She does not note that the burning sensation is changed by position or affected by eating in any way.  She noted subjective fever at this time, but no diaphoresis, SOB, chest pain, or pain anywhere else in her body.  She notes no recent diarrhea, sick contacts, unusual foods.  She has had some recent dysuria, but no increase in urge or frequency.  She has not had any difficulty ambulating or had any dizziness or lightheadness.   VITALS: T-98.2, BP- 114/65, HR- 75, R-11, O2 sats- 99% on RA.  HEENT: normocephalic, atraumatic, sclera anicteric, PERRL, EOMI, moist mucous membranes Lungs: CTAB, nml WOB CVS: RRR, S1, S2 normal , no M/R/G.  Abdomen: soft, non-distended, mild tender over sub-epigastric region on palpation, normoactive BS, no hepatosplenomegaly  Extremities: no edema, effusion or erythema  Labs: reviewed. CBC, BMet non-contributory, LFTs wnl, lipase wnl, troponin negative CXR: reviewed. AXR: reviewed, non-obstructive bowel gas pattern EKG:  Reviewed, q-waves in inferiorlateral leads present on previous EKG, no new ST-segment changes  Assessment : Patient likely is experiencing a reflux episode accompanied by possible acute gastritis; she states she is not currently taking any anti-reflux medications    This is unlikely a cardiac process given the presentation of her symptoms and her negative cardiac workup.  She does not appear to have any immediately concerning abdominal findings and shows no signs of bowel  obstruction.  She was negative for peritoneal signs and afebrile, suggesting against an inflammatory abdominal process.   Plan:  Will discharge patient home with omeprazole. Will schedule a followup appointment for her in the outpatient clinic.     Note was left in Dr Jory Ee inbasket after her last day. I am signing the note to complete the process but was not involved in the pat's care.

## 2011-02-08 LAB — CBC
HCT: 37.3
Hemoglobin: 12.6
MCHC: 33.8
MCV: 92
Platelets: 165
RDW: 13.7

## 2011-02-08 LAB — I-STAT 8, (EC8 V) (CONVERTED LAB)
BUN: 24 — ABNORMAL HIGH
Chloride: 108
Glucose, Bld: 207 — ABNORMAL HIGH
Hemoglobin: 13.6
Operator id: 270111
pCO2, Ven: 28.2 — ABNORMAL LOW

## 2011-02-08 LAB — POCT CARDIAC MARKERS
CKMB, poc: 1.4
Myoglobin, poc: 62.8
Troponin i, poc: <0.05

## 2011-02-08 LAB — DIFFERENTIAL
Basophils Absolute: 0
Basophils Relative: 1
Eosinophils Absolute: 0.2
Eosinophils Relative: 3
Lymphocytes Relative: 23
Monocytes Absolute: 0.5

## 2011-02-11 NOTE — Discharge Summary (Signed)
Bianca [name], Bianca [name] NO.:  [id number]  MEDICAL RECORD NO.:  [id number]  LOCATION:  2002                         FACILITY:  [facility]  PHYSICIAN:  Rollene Rotunda, MD, FACCDATE OF BIRTH:  June 25, 1930  DATE OF ADMISSION:  12/14/2010 DATE OF DISCHARGE:  12/16/2010                              DISCHARGE SUMMARY   PRIMARY CARDIOLOGIST:  Rollene Rotunda, MD, [facility]  PRIMARY CARE PROVIDER:  [facility].  DISCHARGE DIAGNOSIS:  Unstable angina.  SECONDARY DIAGNOSES: 1. Coronary artery disease status post prior coronary artery bypass     grafting with 2/5 patent grafts by catheterization this admission. 2. Hypertension. 3. Hyperlipidemia. 4. Diabetes mellitus. 5. Gastroesophageal reflux disease. 6. Stage III chronic kidney disease. 7. History of hepatitis B. 8. History of cervical dysplasia status post hysterectomy. 9. History of positive purified protein derivative in 2008, with     normal chest x-ray. 10.History of spinal stenosis status post decompression. 11.History of recurrent urinary tract infections. 12.History of carotid bruit with normal Doppler in August 2011.  ALLERGIES:  ACE INHIBITORS cause cough.  PROCEDURES:  Left heart cardiac catheterization performed on December 14, 2010, revealing severe native vessel coronary artery disease with an occlusion of the LAD and the right coronary artery and 30% first obtuse marginal and 80% second obtuse marginal lesions.  The vein graft to the distal RCA was patent while the jump graft from that area to the PDA was occluded (old).  Vein graft to the diagonal was occluded (old).  Vein graft to the second obtuse marginal was occluded (new).  The LIMA to the LAD was patent with collaterals to the PDA.  Medical therapy was recommended.  HISTORY OF PRESENT ILLNESS:  A 77 year old female with prior history of coronary artery disease status post coronary artery bypass grafting in 2008, who was in her usual  state of health until the evening prior to admission when she developed substernal chest discomfort which was intermittent in nature, associated with dizziness.  Tylenol and nitroglycerin seemed to help some, but she could never quite get full relief.  She presented to [facility] ED on December 14, 2010, where ECG showed no acute changes and point-of-care markers were negative. She was admitted for evaluation.  HOSPITAL COURSE:  The patient ruled out for MI.  She continued to have intermittent chest discomfort and decision was made to pursue catheterization which showed severe multivessel native coronary artery disease as well as significant graft disease and a new occlusion of the vein graft to the second obtuse marginal (new since July 2011).  Films were reviewed and it was felt that the trial of medical therapy was warranted.  However, that if the patient had recurrent chest pain, an attempt could be made at PCI of the native second obtuse marginal.  The patient was hypertensive during this admission and her home medications were continued.  She was also placed on hydralazine therapy. Long-acting nitrate was added in the setting of her catheterization findings and she has not had any recurrent chest pain.  She has been ambulating without difficulty and will be discharged home today in good condition.  DISCHARGE LABS:  Hemoglobin 10.3,  hematocrit 31.4, WBC 6.0, platelets 126.  INR 1.0.  Sodium 137, potassium 3.9, chloride 105, CO2 22, BUN 20, creatinine 1.23, glucose 192.  Total bilirubin 0.7, alkaline phosphatase 52, AST 19, ALT 12, total protein 6.6, albumin 3.4, calcium 8.5, CK 34, MB 1.7, troponin I less than 0.30.  MRSA screen was negative.  DISPOSITION:  The patient will be discharged home today in good condition.  FOLLOWUP PLANS AND APPOINTMENTS:  The patient will follow up with Tereso Newcomer, PA, on January 05, 2011, at 10:30 a.m.  She will follow up with [facility] as previously scheduled for continued followup of chronic anemia and thrombocytopenia.  DISCHARGE MEDICATIONS: 1. Hydralazine 25 mg t.i.d. 2. Imdur 30 mg daily. 3. Carvedilol 25 mg b.i.d. 4. Nitroglycerin 0.4 mg sublingual p.r.n. chest pain. 5. Aspirin 81 mg daily. 6. Crestor 20 mg daily. 7. Famotidine 20 mg b.i.d. 8. Lantus 55 units at bedtime. 9. Losartan 50 mg daily. 10.Metformin 500 mg 2 tablets b.i.d. 11.Plavix 75 mg daily. 12.Promethazine 12.5 mg q.6 hours p.r.n. nausea.  OUTSTANDING LAB STUDIES:  None.  DURATION OF DISCHARGE ENCOUNTER:  35 minutes including physician time.     Nicolasa Ducking, ANP   ______________________________ Rollene Rotunda, MD, [facility]    CB/MEDQ  D:  12/16/2010  T:  12/16/2010  Job:  [id number]  cc:   [facility]  Electronically Signed by Nicolasa Ducking ANP on 12/24/2010 03:00:49 PM Electronically Signed by Rollene Rotunda MD [facility] on 02/11/2011 01:28:11 PM

## 2011-02-12 ENCOUNTER — Ambulatory Visit (INDEPENDENT_AMBULATORY_CARE_PROVIDER_SITE_OTHER): Payer: Medicare Other | Admitting: Internal Medicine

## 2011-02-12 ENCOUNTER — Encounter: Payer: Self-pay | Admitting: Internal Medicine

## 2011-02-12 VITALS — BP 106/68 | HR 83 | Temp 98.1°F | Ht 61.5 in | Wt 104.6 lb

## 2011-02-12 DIAGNOSIS — Z23 Encounter for immunization: Secondary | ICD-10-CM

## 2011-02-12 DIAGNOSIS — E119 Type 2 diabetes mellitus without complications: Secondary | ICD-10-CM

## 2011-02-12 DIAGNOSIS — I10 Essential (primary) hypertension: Secondary | ICD-10-CM

## 2011-02-12 DIAGNOSIS — I2510 Atherosclerotic heart disease of native coronary artery without angina pectoris: Secondary | ICD-10-CM

## 2011-02-12 DIAGNOSIS — Z299 Encounter for prophylactic measures, unspecified: Secondary | ICD-10-CM

## 2011-02-12 LAB — GLUCOSE, CAPILLARY: Glucose-Capillary: 145 mg/dL — ABNORMAL HIGH (ref 70–99)

## 2011-02-12 LAB — POCT GLYCOSYLATED HEMOGLOBIN (HGB A1C): Hemoglobin A1C: 7.6

## 2011-02-12 NOTE — Assessment & Plan Note (Signed)
Lab Results  Component Value Date   NA 141 02/07/2011   K 3.5 02/07/2011   CL 105 02/07/2011   CO2 22 02/07/2011   BUN 16 02/07/2011   CREATININE 1.28* 02/07/2011   CREATININE 1.19 07/06/2010    BP Readings from Last 3 Encounters:  02/12/11 106/68  01/27/11 132/60  01/19/11 130/64    Assessment: Hypertension control:  controlled  Progress toward goals:  improved Barriers to meeting goals:  no barriers identified  Plan: Hypertension treatment:  continue current medications

## 2011-02-12 NOTE — Assessment & Plan Note (Signed)
Lab Results  Component Value Date   HGBA1C 7.6 02/12/2011   HGBA1C  Value: 7.5 (NOTE)                                                                       According to the ADA Clinical Practice Recommendations for 2011, when HbA1c is used as a screening test:   >=6.5%   Diagnostic of Diabetes Mellitus           (if abnormal result  is confirmed)  5.7-6.4%   Increased risk of developing Diabetes Mellitus  References:Diagnosis and Classification of Diabetes Mellitus,Diabetes Care,2011,34(Suppl 1):S62-S69 and Standards of Medical Care in         Diabetes - 2011,Diabetes Care,2011,34  (Suppl 1):S11-S61.* 05/16/2010   CREATININE 1.28* 02/07/2011   CREATININE 1.19 07/06/2010   MICROALBUR 14.03* 12/29/2009   MICRALBCREAT 339.7* 12/29/2009   CHOL  Value: 142        ATP III CLASSIFICATION:  <200     mg/dL   Desirable  846-962  mg/dL   Borderline High  >=952    mg/dL   High        8/41/3244   HDL 48 05/16/2010   TRIG 120 05/16/2010    Last eye exam and foot exam: No results found for this basename: HMDIABEYEEXA, HMDIABFOOTEX    Assessment: Diabetes control: controlled Progress toward goals: unchanged Barriers to meeting goals: no barriers identified  Plan: Diabetes treatment: continue current medications Refer to: none Instruction/counseling given: reminded to bring blood glucose meter & log to each visit, reminded to bring medications to each visit, discussed foot care and discussed diet

## 2011-02-12 NOTE — Progress Notes (Signed)
  Subjective:    Patient ID: Bianca [name], female    DOB: 1931/03/01, 77 y.o.   MRN: [id number]  HPI Ms. Altmann is a pleasant 20 year woman with history of CAD- status post CABG, hypertension, hyperlipidemia, diabetes mellitus, GERD, chronic kidney disease stage III and hepatitis B, comes the clinic for ER followup. She was seen in the emergency room on 02/07/2011 for chest pain-which was ruled out for any ACS. All the lab tests were at baseline. No increased troponin and no x-ray changes or EKG changes. She comes today for followup and denies any significant chest pain or shortness of breath. She does complain of chest tightness after walking about 15-20 minutes- which she is having for long time now-stable and not getting worse. She denies any fever, chills, nausea vomiting, abdominal pain, diarrhea, headache. During her last visit with cardiologist office on 01/19/2011, Cozaar was stopped due to increasing creatinine from 1.2 to1.8. After that her creatinine came back to baseline and was checked in ER on 02/06/2011.      Review of Systems    as per history of present illness, all other systems reviewed and negative. Objective:   Physical Exam Vitals: Reviewed and stable General: NAD HEENT: PERRL, EOMI, no scleral icterus Cardiac: S1, S2 RRR, no rubs, murmurs or gallops Pulm: clear to auscultation bilaterally, moving normal volumes of air Abd: soft, nontender, nondistended, BS present Ext: warm and well perfused, no pedal edema Neuro: alert and oriented X3, cranial nerves II-XII grossly intact, strength and sensation to light touch equal in bilateral upper and lower extremities        Assessment & Plan:

## 2011-02-12 NOTE — Patient Instructions (Signed)
Please make a followup appointment in 2-3 months. Followup with cardiologist and follow the recommendations for her medications. No change in medication today. Please take all your medications regularly.

## 2011-02-12 NOTE — Assessment & Plan Note (Signed)
Continue aspirin, Plavix, Norvasc, Imdur, Crestor, carvedilol per cardiologist recommendations. Recently stopped Cozaar(due to increase in creatinine) and hydralazine  due to intolerance. Appointment with Dr. Antoine Poche on 03/18/2011.

## 2011-02-16 ENCOUNTER — Encounter: Payer: Medicare Other | Admitting: Internal Medicine

## 2011-02-22 ENCOUNTER — Other Ambulatory Visit: Payer: Self-pay | Admitting: Internal Medicine

## 2011-03-18 ENCOUNTER — Ambulatory Visit: Payer: Medicare Other | Admitting: Cardiology

## 2011-03-23 ENCOUNTER — Ambulatory Visit (INDEPENDENT_AMBULATORY_CARE_PROVIDER_SITE_OTHER): Payer: Medicare Other | Admitting: Cardiology

## 2011-03-23 ENCOUNTER — Encounter: Payer: Self-pay | Admitting: Cardiology

## 2011-03-23 VITALS — BP 126/72 | HR 73 | Wt 103.0 lb

## 2011-03-23 DIAGNOSIS — I10 Essential (primary) hypertension: Secondary | ICD-10-CM

## 2011-03-23 DIAGNOSIS — N259 Disorder resulting from impaired renal tubular function, unspecified: Secondary | ICD-10-CM

## 2011-03-23 DIAGNOSIS — I2510 Atherosclerotic heart disease of native coronary artery without angina pectoris: Secondary | ICD-10-CM

## 2011-03-23 DIAGNOSIS — E785 Hyperlipidemia, unspecified: Secondary | ICD-10-CM

## 2011-03-23 NOTE — Assessment & Plan Note (Signed)
She is at target with her lipids.  She will remain on the meds as listed.

## 2011-03-23 NOTE — Assessment & Plan Note (Signed)
The patient has no new sypmtoms.  No further cardiovascular testing is indicated.  We will continue with aggressive risk reduction and meds as listed.  

## 2011-03-23 NOTE — Assessment & Plan Note (Signed)
The blood pressure is at target. No change in medications is indicated. We will continue with therapeutic lifestyle changes (TLC).  

## 2011-03-23 NOTE — Assessment & Plan Note (Signed)
Her creat in Oct was better than previous off of ARB.  She will remain on the meds as listed.

## 2011-03-23 NOTE — Patient Instructions (Signed)
Follow up in 6 months with Dr Hochrein.  You will receive a letter in the mail 2 months before you are due.  Please call us when you receive this letter to schedule your follow up appointment.  Continue current medications as listed 

## 2011-03-23 NOTE — Progress Notes (Signed)
HPI  The patient returns for follow up of CAD.  Since I last saw her she has done well.  She walks for exercise. The patient denies any new symptoms such as chest discomfort, neck or arm discomfort. There has been no new shortness of breath, PND or orthopnea. There have been no reported palpitations, presyncope or syncope.  Allergies  Allergen Reactions  . Cozaar Other (See Comments)    Sig increase in her creatinine that resolved once it was D/C'd    Current Outpatient Prescriptions  Medication Sig Dispense Refill  . ALBUTEROL IN Inhale into the lungs.        Marland Kitchen amLODipine (NORVASC) 5 MG tablet Take 2 tablets (10 mg total) by mouth daily.  60 tablet  11  . aspirin 325 MG EC tablet Take 81 mg by mouth daily.       . carvedilol (COREG) 25 MG tablet Take 25 mg by mouth 2 (two) times daily with a meal.        . CRESTOR 20 MG tablet TAKE 1 TABLET BY MOUTH EVERY DAY  30 tablet  8  . famotidine (PEPCID) 20 MG tablet Take 1 tablet (20 mg total) by mouth 2 (two) times daily.  60 tablet  4  . glucose blood test strip Use as instructed  100 each  12  . hydrALAZINE (APRESOLINE) 25 MG tablet Take 25 mg by mouth 3 (three) times daily.        . hydrochlorothiazide (HYDRODIURIL) 25 MG tablet TAKE 1 TABLET BY MOUTH EVERY DAY  30 tablet  5  . insulin glargine (LANTUS) 100 UNIT/ML injection Inject 55 Units into the skin at bedtime.        . Insulin Syringe-Needle U-100 (SB INSULIN SYRINGE) 31G X 5/16" 1 ML MISC Use to inject Lantus once daily       . isosorbide mononitrate (IMDUR) 30 MG 24 hr tablet Take 1 tablet (30 mg total) by mouth daily.  30 tablet  6  . Lancets (ONETOUCH ULTRASOFT) lancets Use as instructed  100 each  11  . metFORMIN (GLUCOPHAGE) 500 MG tablet TAKE 2 TABLETS BY MOUTH TWO TIMES A DAY  120 tablet  0  . nitroGLYCERIN (NITROSTAT) 0.4 MG SL tablet Place 0.4 mg under the tongue every 5 (five) minutes as needed. Maximum of 3 doses       . PLAVIX 75 MG tablet TAKE 1 TABLET BY MOUTH ONCE A  DAY  31 tablet  10  . promethazine (PHENERGAN) 12.5 MG tablet TAKE 1 TABLET BY MOUTH EVERY 4-6 HOURS AS NEEDED FOR NAUSEA/VOMITING  30 tablet  2    Past Medical History  Diagnosis Date  . Diabetes mellitus, type 2   . GERD (gastroesophageal reflux disease)   . Hepatitis B   . Hyperlipemia   . Hypertension   . Cervical dysplasia   . Atrophic vaginitis   . Spinal stenosis     s/p decompression  . CAD (coronary artery disease) April 2008    s/p CABG;    cath 8/12:  LM patent, pLAD occluded, OM1 30-40%, pOM2 80% and 60% after the anastomosis, pRCA occluded, S-DX occluded (old), S-OM 2 occluded (new), L-LAD patent, dLAD provided collats to the PDA; Med Rx  rec.; consider PCI to OM2 if fails med Rx    Past Surgical History  Procedure Date  . Back surgery     decompression L3-S1  . Coronary artery bypass graft April 2008  . Total abdominal hysterectomy  cervical dysplasia    ROS:  As stated in the HPI and negative for all other systems.  PHYSICAL EXAM BP 126/72  Pulse 73  Wt 103 lb (46.72 kg) GENERAL:  Well appearing HEENT:  Pupils equal round and reactive, fundi not visualized, oral mucosa unremarkable NECK:  No jugular venous distention, waveform within normal limits, carotid upstroke brisk and symmetric, no bruits, no thyromegaly LYMPHATICS:  No cervical, inguinal adenopathy LUNGS:  Clear to auscultation bilaterally BACK:  No CVA tenderness CHEST:  Unremarkable HEART:  PMI not displaced or sustained,S1 and S2 within normal limits, no S3, no S4, no clicks, no rubs, no murmurs ABD:  Flat, positive bowel sounds normal in frequency in pitch, no bruits, no rebound, no guarding, no midline pulsatile mass, no hepatomegaly, no splenomegaly EXT:  2 plus pulses throughout, no edema, no cyanosis no clubbing SKIN:  No rashes no nodules NEURO:  Cranial nerves II through XII grossly intact, motor grossly intact throughout PSYCH:  Cognitively intact, oriented to person place and  time  EKG:  Sinus rhythm, rate 66, axis within normal limits, intervals within normal limits, no acute ST-T wave changes.   ASSESSMENT AND PLAN

## 2011-05-18 ENCOUNTER — Encounter: Payer: Self-pay | Admitting: Internal Medicine

## 2011-05-18 ENCOUNTER — Ambulatory Visit (INDEPENDENT_AMBULATORY_CARE_PROVIDER_SITE_OTHER): Payer: Medicare Other | Admitting: Internal Medicine

## 2011-05-18 VITALS — BP 160/80 | HR 67 | Temp 97.1°F | Ht 61.5 in | Wt 101.1 lb

## 2011-05-18 DIAGNOSIS — K219 Gastro-esophageal reflux disease without esophagitis: Secondary | ICD-10-CM

## 2011-05-18 DIAGNOSIS — E119 Type 2 diabetes mellitus without complications: Secondary | ICD-10-CM

## 2011-05-18 DIAGNOSIS — R059 Cough, unspecified: Secondary | ICD-10-CM

## 2011-05-18 DIAGNOSIS — R05 Cough: Secondary | ICD-10-CM

## 2011-05-18 MED ORDER — GLUCOSE BLOOD VI STRP: ORAL_STRIP | Status: DC

## 2011-05-18 MED ORDER — ALBUTEROL SULFATE HFA 108 (90 BASE) MCG/ACT IN AERS: 1.0000 | INHALATION_SPRAY | Freq: Four times a day (QID) | RESPIRATORY_TRACT | Status: DC | PRN

## 2011-05-18 MED ORDER — ONETOUCH ULTRASOFT LANCETS MISC: Status: DC

## 2011-05-18 MED ORDER — FAMOTIDINE 20 MG PO TABS: 20.0000 mg | ORAL_TABLET | Freq: Two times a day (BID) | ORAL | Status: DC

## 2011-05-18 NOTE — Progress Notes (Signed)
Subjective:   Patient ID: Bianca [name] female   DOB: 1930-07-12 78 y.o.   MRN: [id number]  HPI: [name] is a 78 y.o.   [name] with PMH of CAD- status post CABG, hypertension, hyperlipidemia, diabetes mellitus, GERD, chronic kidney disease stage III and hepatitis B, who presents for a regular follow-up visit.  She dose not have any complaints. Denies fever, chills, fatigue, headaches,  cough, chest pain, SOB,  abdominal pain,diarrhea, constipation, dysuria, urgency, frequency, hematuria, joint pain or leg swelling. She wants to get refill of some of he medications.   Past Medical History  Diagnosis Date  . Diabetes mellitus, type 2   . GERD (gastroesophageal reflux disease)   . Hepatitis B   . Hyperlipemia   . Hypertension   . Cervical dysplasia   . Atrophic vaginitis   . Spinal stenosis     s/p decompression  . CAD (coronary artery disease) April 2008    s/p CABG;    cath 8/12:  LM patent, pLAD occluded, OM1 30-40%, pOM2 80% and 60% after the anastomosis, pRCA occluded, S-DX occluded (old), S-OM 2 occluded (new), L-LAD patent, dLAD provided collats to the PDA; Med Rx  rec.; consider PCI to OM2 if fails med Rx   Current Outpatient Prescriptions  Medication Sig Dispense Refill  . ALBUTEROL IN Inhale into the lungs.        Marland Kitchen amLODipine (NORVASC) 5 MG tablet Take 2 tablets (10 mg total) by mouth daily.  60 tablet  11  . aspirin 325 MG EC tablet Take 81 mg by mouth daily.       . carvedilol (COREG) 25 MG tablet Take 25 mg by mouth 2 (two) times daily with a meal.        . CRESTOR 20 MG tablet TAKE 1 TABLET BY MOUTH EVERY DAY  30 tablet  8  . famotidine (PEPCID) 20 MG tablet Take 1 tablet (20 mg total) by mouth 2 (two) times daily.  60 tablet  4  . glucose blood test strip Use as instructed  100 each  12  . hydrALAZINE (APRESOLINE) 25 MG tablet Take 25 mg by mouth 3 (three) times daily.        . hydrochlorothiazide (HYDRODIURIL) 25 MG tablet TAKE 1 TABLET BY  MOUTH EVERY DAY  30 tablet  5  . insulin glargine (LANTUS) 100 UNIT/ML injection Inject 55 Units into the skin at bedtime.        . Insulin Syringe-Needle U-100 (SB INSULIN SYRINGE) 31G X 5/16" 1 ML MISC Use to inject Lantus once daily       . isosorbide mononitrate (IMDUR) 30 MG 24 hr tablet Take 1 tablet (30 mg total) by mouth daily.  30 tablet  6  . metFORMIN (GLUCOPHAGE) 500 MG tablet TAKE 2 TABLETS BY MOUTH TWO TIMES A DAY  120 tablet  0  . nitroGLYCERIN (NITROSTAT) 0.4 MG SL tablet Place 0.4 mg under the tongue every 5 (five) minutes as needed. Maximum of 3 doses       . albuterol (VENTOLIN HFA) 108 (90 BASE) MCG/ACT inhaler Inhale 1 puff into the lungs every 6 (six) hours as needed for wheezing.  1 Inhaler  6  . Lancets (ONETOUCH ULTRASOFT) lancets Use as instructed  100 each  11  . PLAVIX 75 MG tablet TAKE 1 TABLET BY MOUTH ONCE A DAY  31 tablet  10  . promethazine (PHENERGAN) 12.5 MG tablet TAKE 1 TABLET BY MOUTH EVERY  4-6 HOURS AS NEEDED FOR NAUSEA/VOMITING  30 tablet  2   No family history on file. History   Social History  . Marital Status: Widowed    Spouse Name: N/A    Number of Children: N/A  . Years of Education: N/A   Social History Main Topics  . Smoking status: Never Smoker   . Smokeless tobacco: None  . Alcohol Use: No  . Drug Use: No  . Sexually Active: None   Other Topics Concern  . None   Social History Narrative   Chinese immigrantHousewifeWidowedLives w/ her son   Review of Systems: as per HPI  Objective:  Physical Exam: Filed Vitals:   05/18/11 1412  BP: 160/80  Pulse: 67  Temp: 97.1 F (36.2 C)  TempSrc: Oral  Height: 5' 1.5" (1.562 m)  Weight: 101 lb 1.6 oz (45.859 kg)  SpO2: 100%    Vitals: Reviewed and stable General: not in acute distress. HEENT: PERRL, EOMI, no scleral icterus Cardiac: S1, S2 RRR, no rubs, murmurs or gallops Pulm: clear to auscultation bilaterally, moving normal volumes of air Abd: soft, nontender, nondistended, BS  present Ext: warm and well perfused, no pedal edema Neuro: alert and oriented X3, cranial nerves II-XII grossly intact, strength and sensation to light touch equal in bilateral upper and lower extremities   Assessment & Plan:    # Anemia: unclear etiology. Her Hgb was 9.4 on 02/07/2011. Likely due to her chronic anemia. However, at her age, colon cancer should be ruled out. I discussed this issue and my concern with patient. She would like to think it over and make her decision on next visit. My plan is to address this question on her next visit. If she still dose not want to do colonoscopy, will order FOBT and anemia panel.  #. CAD: stabel, no chest pain, will continue her current meds and follow up.    # HTN: her blood pressure is 160/80 mmHg. She is already on 4 mediations now, including amlodipine, coreg, hydralazine, HCTZ. Also on imdur for her CAD. I would like to observe her until next visit before making any changes. Will continue the current regimen and monitor.  # DM-II: her A1c is 6.7 today. Will continue the current regimen and follow up.  # HLD: well controlled.  her LDL was 70 on 05/16/10. Her ALT and AST was normal on 02/07/11. Will continue her current regimen and follow up.   Lorretta Harp

## 2011-05-18 NOTE — Assessment & Plan Note (Signed)
A 

## 2011-05-18 NOTE — Patient Instructions (Signed)
1. Please take all medications as prescribed.  2. If you have worsening of your symptoms or new symptoms arise, please call the clinic [phone]), or go to the ER immediately if symptoms are severe. 3. Please think about doing colonoscope in your next visit.

## 2011-05-19 NOTE — Progress Notes (Signed)
I agree with Dr. Niu's assessment and plan. 

## 2011-05-22 ENCOUNTER — Other Ambulatory Visit: Payer: Self-pay | Admitting: Internal Medicine

## 2011-05-25 ENCOUNTER — Other Ambulatory Visit: Payer: Self-pay | Admitting: Internal Medicine

## 2011-06-30 ENCOUNTER — Other Ambulatory Visit: Payer: Self-pay | Admitting: Internal Medicine

## 2011-06-30 NOTE — Telephone Encounter (Signed)
Pls put pt on [facility] wait list. Otherwise, needs May appt as no clinics in April.

## 2011-07-06 ENCOUNTER — Encounter: Payer: Medicare Other | Admitting: Internal Medicine

## 2011-08-11 ENCOUNTER — Other Ambulatory Visit: Payer: Self-pay | Admitting: Internal Medicine

## 2011-08-11 DIAGNOSIS — E119 Type 2 diabetes mellitus without complications: Secondary | ICD-10-CM

## 2011-09-01 ENCOUNTER — Encounter: Payer: Self-pay | Admitting: Physician Assistant

## 2011-09-01 ENCOUNTER — Telehealth: Payer: Self-pay | Admitting: Dietician

## 2011-09-01 ENCOUNTER — Ambulatory Visit (INDEPENDENT_AMBULATORY_CARE_PROVIDER_SITE_OTHER): Payer: Medicare Other | Admitting: Physician Assistant

## 2011-09-01 VITALS — BP 147/67 | HR 70 | Ht 60.0 in | Wt 108.0 lb

## 2011-09-01 DIAGNOSIS — R42 Dizziness and giddiness: Secondary | ICD-10-CM

## 2011-09-01 DIAGNOSIS — R0602 Shortness of breath: Secondary | ICD-10-CM

## 2011-09-01 DIAGNOSIS — I10 Essential (primary) hypertension: Secondary | ICD-10-CM

## 2011-09-01 DIAGNOSIS — E119 Type 2 diabetes mellitus without complications: Secondary | ICD-10-CM

## 2011-09-01 DIAGNOSIS — R002 Palpitations: Secondary | ICD-10-CM

## 2011-09-01 DIAGNOSIS — R61 Generalized hyperhidrosis: Secondary | ICD-10-CM

## 2011-09-01 DIAGNOSIS — I2510 Atherosclerotic heart disease of native coronary artery without angina pectoris: Secondary | ICD-10-CM

## 2011-09-01 LAB — CBC WITH DIFFERENTIAL/PLATELET
Basophils Absolute: 0 10*3/uL (ref 0.0–0.1)
Eosinophils Absolute: 0.1 10*3/uL (ref 0.0–0.7)
Lymphocytes Relative: 19.2 % (ref 12.0–46.0)
Lymphs Abs: 1.2 10*3/uL (ref 0.7–4.0)
MCHC: 33.4 g/dL (ref 30.0–36.0)
Monocytes Relative: 8.9 % (ref 3.0–12.0)
Platelets: 154 10*3/uL (ref 150.0–400.0)
RDW: 15 % — ABNORMAL HIGH (ref 11.5–14.6)

## 2011-09-01 LAB — BASIC METABOLIC PANEL
BUN: 30 mg/dL — ABNORMAL HIGH (ref 6–23)
Calcium: 9.3 mg/dL (ref 8.4–10.5)
GFR: 30.03 mL/min — ABNORMAL LOW (ref >60.00)
Potassium: 4.1 mEq/L (ref 3.5–5.1)
Sodium: 142 mEq/L (ref 135–145)

## 2011-09-01 LAB — TSH: TSH: 0.83 u[IU]/mL (ref 0.35–5.50)

## 2011-09-01 MED ORDER — ISOSORBIDE MONONITRATE ER 30 MG PO TB24: 30.0000 mg | ORAL_TABLET | Freq: Every day | ORAL | Status: DC

## 2011-09-01 NOTE — Progress Notes (Signed)
[address] [place], [place]  [address] Phone: [phone] Fax:  [phone]  Date:  09/01/2011   Name:  Bianca [name]       DOB:  1931/03/19 MRN:  [id number]  PCP:  Lorretta Harp, MD, MD  Primary Cardiologist:  Dr. Rollene Rotunda  Primary Electrophysiologist:  None    History of Present Illness: Bianca [name] is a 78 y.o. female who returns for evaluation of episodic diaphoresis and palpitations.  She has a history of CAD, status post CABG, hypertension, hyperlipidemia, diabetes mellitus, GERD, chronic kidney disease stage III and hepatitis B. Echocardiogram 5/08: Mid to distal inferior septal hypokinesis, inferior hypokinesis, EF 50-55%, normal wall thickness. Carotids 10/11: No ICA stenosis bilaterally.   Admitted 12/2011 with chest pain and MI was ruled out. LHC 12/15/10: LM patent, pLAD occluded, OM1 30-40%, pOM2 80% and 60% after the anastomosis, pRCA occluded, SVG-DX occluded (old), SVG-OM 2 occluded (new), LIMA-LAD patent, dLAD provided collaterals to the PDA. Medical therapy was recommended. It was felt that the pOM2 could be treated with PCI if the patient failed medical therapy.   Does not speak [place].  Here with her daughter who interprets.  Over the last 2 weeks, she notes sudden awakening around 3 or 4:00 in the morning with diaphoresis and tachycardia palpitations.  This may last 20 minutes.  She feels dizzy.  She denies syncope.  She does feel chest tightness and shortness of breath.  Nitroglycerin has helped.  She  also has had juice to drink which has helped.  Of note, she does not have refills for her glucometer strips and has not been able to check her sugar.  The CMA noted today that her insulin has not been stored in the refrigerator of late.  She does note some chronic left-sided chest tightness with exertion without change.  She denies dyspnea with exertion.  She denies orthopnea, PND or edema.  She denies palpitations during the day.  She does  have some dizziness with standing at times.  Past Medical History  Diagnosis Date  . Diabetes mellitus, type 2   . GERD (gastroesophageal reflux disease)   . Hepatitis B   . Hyperlipemia   . Hypertension   . Cervical dysplasia   . Atrophic vaginitis   . Spinal stenosis     s/p decompression  . CAD (coronary artery disease) April 2008    s/p CABG;    cath 8/12:  LM patent, pLAD occluded, OM1 30-40%, pOM2 80% and 60% after the anastomosis, pRCA occluded, S-DX occluded (old), S-OM 2 occluded (new), L-LAD patent, dLAD provided collats to the PDA; Med Rx  rec.; consider PCI to OM2 if fails med Rx    Current Outpatient Prescriptions  Medication Sig Dispense Refill  . albuterol (VENTOLIN HFA) 108 (90 BASE) MCG/ACT inhaler Inhale 1 puff into the lungs every 6 (six) hours as needed for wheezing.  1 Inhaler  6  . amLODipine (NORVASC) 5 MG tablet Take 2 tablets (10 mg total) by mouth daily.  60 tablet  11  . aspirin 325 MG EC tablet Take 81 mg by mouth daily.       . carvedilol (COREG) 25 MG tablet Take 25 mg by mouth 2 (two) times daily with a meal.        . CRESTOR 20 MG tablet TAKE 1 TABLET BY MOUTH EVERY DAY  30 tablet  8  . famotidine (PEPCID) 20 MG tablet Take 1 tablet (20 mg total) by  mouth 2 (two) times daily.  60 tablet  4  . glucose blood test strip Use as instructed  100 each  12  . hydrALAZINE (APRESOLINE) 25 MG tablet Take 25 mg by mouth 3 (three) times daily.        . hydrochlorothiazide (HYDRODIURIL) 25 MG tablet TAKE 1 TABLET BY MOUTH EVERY DAY  30 tablet  5  . Insulin Syringe-Needle U-100 (SB INSULIN SYRINGE) 31G X 5/16" 1 ML MISC Use to inject Lantus once daily       . Lancets (ONETOUCH ULTRASOFT) lancets USE AS DIRECTED TO CHECK BLOOD SUGER AT LEAST TWICE A DAY  100 each  3  . LANTUS 100 UNIT/ML injection INJECT 55 UNITS SUBCUTANEOUSLY AT BEDTIME  20 mL  5  . metFORMIN (GLUCOPHAGE) 500 MG tablet TAKE 2 TABLETS BY MOUTH TWO TIMES A DAY  120 tablet  0  . nitroGLYCERIN  (NITROSTAT) 0.4 MG SL tablet Place 0.4 mg under the tongue every 5 (five) minutes as needed. Maximum of 3 doses       . promethazine (PHENERGAN) 12.5 MG tablet TAKE 1 TABLET BY MOUTH EVERY 4-6 HOURS AS NEEDED FOR NAUSEA/VOMITING  30 tablet  2  . clopidogrel (PLAVIX) 75 MG tablet       . isosorbide mononitrate (IMDUR) 30 MG 24 hr tablet Take 1 tablet (30 mg total) by mouth daily.  30 tablet  11    Allergies: Allergies  Allergen Reactions  . Cozaar Other (See Comments)    Sig increase in her creatinine that resolved once it was D/C'd    History  Substance Use Topics  . Smoking status: Never Smoker   . Smokeless tobacco: Not on file  . Alcohol Use: No     ROS:  Please see the history of present illness.   + constipation; ? Black stools.   All other systems reviewed and negative.   PHYSICAL EXAM: VS:  BP 147/67  Pulse 70  Ht 5' (1.524 m)  Wt 108 lb (48.988 kg)  BMI 21.09 kg/m2  Filed Vitals:   09/01/11 0923 09/01/11 0924 09/01/11 0925 09/01/11 0926  BP: 139/72 133/69 139/71 147/67  Pulse: 69 68 69 70  Height:      Weight:        Well nourished, well developed, in no acute distress HEENT: normal Neck: no JVD Cardiac:  normal S1, S2; RRR; no murmur Lungs:  clear to auscultation bilaterally, no wheezing, rhonchi or rales Abd: soft, nontender, no hepatomegaly Ext: no edema Skin: warm and dry Neuro:  CNs 2-12 intact, no focal abnormalities noted  EKG:  Sinus rhythm, heart rate 69, normal axis, Inferior Q waves, nonspecific ST-T wave changes, no change from prior tracing  ASSESSMENT AND PLAN:  1. Diaphoresis Etiology unclear.  Strong suspicion for hypoglycemia as a cause.  She cannot check her sugars at this time.  I discussed her case with Dr. Antoine Poche.  We considered whether or not to proceed to stress testing.  She has chronic kidney disease and would be high risk for contrast-induced nephropathy should she need cath.  At this point in time, we do not suspect angina as the  cause of her present symptoms.  Obtain labs today: Basic metabolic panel, CBC, TSH, BNP.  I will also arrange an event monitor.  Followup with Dr. Antoine Poche in 4-6 weeks  2. Palpitations Obtain event monitor as noted.  3. Coronary artery disease Appears stable.  Continue asa and plavix.  She is no longer on Imdur  for unclear reasons.  Restart Imdur 30 mg QD.  4. Hypertension Restart Imdur.    5. Diabetes mellitus Arrange early follow up with PCP.  As noted, I have a strong suspicion for early morning hypoglycemia as a cause for her symptoms.     Signed, Tereso Newcomer, PA-C  3:55 PM 09/01/2011

## 2011-09-01 NOTE — Patient Instructions (Addendum)
LABS TODAY, BMET, CBC W/DIFF, TSH, BNP   Your physician has recommended that you wear an event monitor DX 785.1. Event monitors are medical devices that record the heart's electrical activity. Doctors most often [place] these monitors to diagnose arrhythmias. Arrhythmias are problems with the speed or rhythm of the heartbeat. The monitor is a small, portable device. You can wear one while you do your normal daily activities. This is usually used to diagnose what is causing palpitations/syncope (passing out).   START IMDUR 30 MG 1 TABLET DAILY APRESCRIPTION  FOLLOW UP WITH DR. KAPADIA @ [facility] ON 09/03/11 @ 1:30 WITH DONNA WITH THE DIABETES CLINIC AND THEN WITH DR. Maisie Fus 3:15 PM  Your physician recommends that you schedule a follow-up appointment in: APPROX 4 WEEKS WITH DR. HOCHREIN

## 2011-09-01 NOTE — Telephone Encounter (Signed)
Per nurse at [facility] cardiology- patient is there in office and reports profuse sweating at 3 am nightly, they question  if she's having hypoglycemia. She's not checking blood sugars, has no diabetes testing supplies. They called [facility]. Prescription needs dx code for Medicare to cover.  Transferred to front office for appointment and will request new prescription be sent to [facility] for diabetes testing supplies.

## 2011-09-02 ENCOUNTER — Other Ambulatory Visit: Payer: Self-pay | Admitting: Internal Medicine

## 2011-09-02 ENCOUNTER — Telehealth: Payer: Self-pay | Admitting: *Deleted

## 2011-09-02 ENCOUNTER — Encounter: Payer: Self-pay | Admitting: Internal Medicine

## 2011-09-02 DIAGNOSIS — I10 Essential (primary) hypertension: Secondary | ICD-10-CM

## 2011-09-02 DIAGNOSIS — E119 Type 2 diabetes mellitus without complications: Secondary | ICD-10-CM

## 2011-09-02 MED ORDER — INSULIN GLARGINE 100 UNIT/ML ~~LOC~~ SOLN: 50.0000 [IU] | Freq: Every day | SUBCUTANEOUS | Status: DC

## 2011-09-02 MED ORDER — ONETOUCH ULTRASOFT LANCETS MISC: Status: DC

## 2011-09-02 MED ORDER — GLUCOSE BLOOD VI STRP: ORAL_STRIP | Status: DC

## 2011-09-02 NOTE — Telephone Encounter (Signed)
Message copied by Tarri Fuller on Thu Sep 02, 2011  2:58 PM ------      Message from: [place], [place] T      Created: Wed Sep 01, 2011  5:29 PM       Hgb stable      BNP slightly elevated        -  Not convinced she has acute CHF        -  Arrange 2 D echo      Creatinine stable, but higher than last        -  Repeat BMET in 2 weeks      Tereso Newcomer, PA-C  5:29 PM 09/01/2011

## 2011-09-02 NOTE — Telephone Encounter (Signed)
s/w pt's son Selena Batten due to pt does not speak any english. Son aware of lab results and we scheduled echo and bmet 09/14/11

## 2011-09-02 NOTE — Telephone Encounter (Signed)
Discussed with Dr. Clyde Lundborg who wants patient to decrease her Lantus to 50 units. Called and spoke to patients son who acknowledged decrease in lantus dose by repeating back to CDE. He was also informed that prescription for testing supplies sent to pharmacy.

## 2011-09-02 NOTE — Progress Notes (Signed)
   Patient ID: Bianca [name], female   DOB: Nov 10, 1930, 78 y.o.   MRN: [id number]                                                    Brief progressive note  On 09/01/11, per nurse at [facility] cardiology- patient was there in office and reports profuse sweating at 3 am nightly, they question if she's having hypoglycemia. She's not checking blood sugars, has no diabetes testing supplies. [facility]. Prescription needs dx code for Medicare to cover.  I wrote prescription of diabetes testing supplies and sent to her pharmacy.   Due to the possible hypoglycemia, I will decrease her lantus dose from 55 to 50 U from now. Ms. Gavin Pound called and spoke to patient's son who acknowledged decrease in lantus dose by repeating back to CDE. He was also informed that prescription for testing supplies sent to pharmacy.     Lorretta Harp, MD PGY1, Internal Medicine Teaching Service Pager: [phone]

## 2011-09-03 ENCOUNTER — Ambulatory Visit (INDEPENDENT_AMBULATORY_CARE_PROVIDER_SITE_OTHER): Payer: Medicare Other | Admitting: Dietician

## 2011-09-03 ENCOUNTER — Encounter: Payer: Medicare Other | Admitting: Internal Medicine

## 2011-09-03 ENCOUNTER — Ambulatory Visit (INDEPENDENT_AMBULATORY_CARE_PROVIDER_SITE_OTHER): Payer: Medicare Other | Admitting: Ophthalmology

## 2011-09-03 VITALS — BP 125/67 | HR 71 | Temp 97.1°F | Ht 60.0 in | Wt 106.1 lb

## 2011-09-03 DIAGNOSIS — E119 Type 2 diabetes mellitus without complications: Secondary | ICD-10-CM

## 2011-09-03 DIAGNOSIS — E785 Hyperlipidemia, unspecified: Secondary | ICD-10-CM

## 2011-09-03 DIAGNOSIS — M7632 Iliotibial band syndrome, left leg: Secondary | ICD-10-CM

## 2011-09-03 LAB — POCT GLYCOSYLATED HEMOGLOBIN (HGB A1C): Hemoglobin A1C: 6.5

## 2011-09-03 LAB — LIPID PANEL
Cholesterol: 134 mg/dL (ref 0–200)
Total CHOL/HDL Ratio: 2.5 Ratio

## 2011-09-03 NOTE — Progress Notes (Signed)
Medical Nutrition Therapy:  Appt start time: 1330 end time:  1430.  Assessment:  Daughter [name] her with patient and serving as Nurse, learning disability. Primary concerns today: Blood sugar control. CBG 74 on her new Contour Next meter. Patient reports before she stopped testing her blood sugar because she couldn't get test strips she had several blood sugars in 60s in the middle of the night. Usual eating pattern includes 1-2 meals and 0-1 snacks per day. Patient complains of decreased appetite, hard stools and has been awakened almost every night for past month with bad feeling and profuse sweating  Everyday foods include coffee,water rice, fish and vegetables.  Avoided foods include fruits and sweets   24-hr recall: B (10-11 AM)-  2/3 cup rice and fish or today homemade croissant sandwich at dtr's house, (1 sl cheese, 1 slice [place], 1 egg) coffee with cream & sugar L ( 1 PM)- today small bowl of clear soup with bits of pork, usually rice and vegetables D (5-7 PM)- soup Usual physical activity includes less activity lately due to knee pain.  Progress Towards Goal(s):  In progress.   Nutritional Diagnosis:  [place]-2.3 Food-medication interaction As related to imbalance between nutrient intake and exogenous insulin.  As evidenced by suspected low blood sugars and mildly elevated A1C.    Intervention:  1- Nutrition education about prevention of low blood sugars. 2- Coordination of care- discuss adjusting insulin dose with physician calculated estimated insulin dose 20- 59 units per day therefore estimated basal insulin dose 50% of that is 10-30 units a day. Could consider adding small dose branding 1-3 daily before meals with rice or bread if self monitoring reveals a need for prandial insulin. 3- Coordination of care- called pharmacy to be sure they got Rx for testing supplies and have patient type of strips needed.  Monitoring/Evaluation:  Dietary intake, exercise, blood sugars, and body weight in 3-4  week(S).

## 2011-09-03 NOTE — Assessment & Plan Note (Signed)
Patient's episodes of coldness/sweatiness are concerning for hypoglycemia. She has had a steady weight loss in past few years (which began before dentures) so she likely needs less insulin since she has lost 10% of her body weight. I have asked her to stop metformin since her kidney function is worsened. Asked her to decrease lantus to 30 units and check sugar twice a day before breakfast and before dinner/before bedtime and bring in meter so we can further make adjustments to the regimen. Daughter in law will also take to dentist to have her dentures better fit to her mouth.

## 2011-09-03 NOTE — Assessment & Plan Note (Signed)
Printed out images of several stretches that patient could do and demonstrated them for her and daughter in law. Advised her to take tylenol as needed for pain since renal function is poor.

## 2011-09-03 NOTE — Patient Instructions (Signed)
-  Ask her to do stretches everyday for 10-15 minutes -take tylenol as needed for pain -avoid ibuprofen, aleeve, naprosyn, advil etc because her kidney function is low -TAKE ONLY 30 UNITS of lantus -measure blood sugar twice a day- when you wake up and before dinner OR before bedtime for 2 weeks and bring in log of blood sugars so we can adjust insulin

## 2011-09-03 NOTE — Progress Notes (Signed)
Subjective:   Patient ID: Bianca [name] female   DOB: 1931-03-02 78 y.o.   MRN: [id number]  HPI: [name] is a 78 y.o. woman who presents for concern for hypoglycemia. She is currently on a high dose of lantus. She felt very cold and sweaty and checked blood sugar and it was in 60s about 2 weeks ago. She started lantus about 2-3 years ago. She reports she has been on a high dose the whole time 50-60 units. She eats less now than she did previously, appetite is less since she got her teeth pulled and she got dentures about 1 year ago. She reports that dentures are loose and they hurt her gums when she chews. Daughter in law reports that she is having hard stool as well.  Left knee hurts when she walks. Hurts in a band like distribution up to hip area. Right knee also hurts but less so.   Feeling very cold and is wearing many jackets for last 5 weeks. Hgb is 10 and TSH in WNL.  Past Medical History  Diagnosis Date  . Diabetes mellitus, type 2   . GERD (gastroesophageal reflux disease)   . Hepatitis B   . Hyperlipemia   . Hypertension   . Cervical dysplasia   . Atrophic vaginitis   . Spinal stenosis     s/p decompression  . CAD (coronary artery disease) April 2008    s/p CABG;    cath 8/12:  LM patent, pLAD occluded, OM1 30-40%, pOM2 80% and 60% after the anastomosis, pRCA occluded, S-DX occluded (old), S-OM 2 occluded (new), L-LAD patent, dLAD provided collats to the PDA; Med Rx  rec.; consider PCI to OM2 if fails med Rx   Current Outpatient Prescriptions  Medication Sig Dispense Refill  . albuterol (VENTOLIN HFA) 108 (90 BASE) MCG/ACT inhaler Inhale 1 puff into the lungs every 6 (six) hours as needed for wheezing.  1 Inhaler  6  . amLODipine (NORVASC) 5 MG tablet Take 2 tablets (10 mg total) by mouth daily.  60 tablet  11  . aspirin 325 MG EC tablet Take 81 mg by mouth daily.       . carvedilol (COREG) 25 MG tablet Take 25 mg by mouth 2 (two) times daily with a meal.        .  clopidogrel (PLAVIX) 75 MG tablet       . CRESTOR 20 MG tablet TAKE 1 TABLET BY MOUTH EVERY DAY  30 tablet  8  . famotidine (PEPCID) 20 MG tablet Take 1 tablet (20 mg total) by mouth 2 (two) times daily.  60 tablet  4  . glucose blood test strip Use to check blood sugar before meals and bedtime and at 3 AM. Dx code: 250.00  150 each  12  . hydrALAZINE (APRESOLINE) 25 MG tablet Take 25 mg by mouth 3 (three) times daily.        . hydrochlorothiazide (HYDRODIURIL) 25 MG tablet TAKE 1 TABLET BY MOUTH EVERY DAY  30 tablet  5  . insulin glargine (LANTUS) 100 UNIT/ML injection Inject 50 Units into the skin at bedtime.  10 mL  0  . Insulin Syringe-Needle U-100 (SB INSULIN SYRINGE) 31G X 5/16" 1 ML MISC Use to inject Lantus once daily       . isosorbide mononitrate (IMDUR) 30 MG 24 hr tablet Take 1 tablet (30 mg total) by mouth daily.  30 tablet  11  . Lancets (ONETOUCH ULTRASOFT) lancets Use to check  blood sugar up to 5 time a day. Dx code: 250.00  100 each  12  . metFORMIN (GLUCOPHAGE) 500 MG tablet TAKE 2 TABLETS BY MOUTH TWO TIMES A DAY  120 tablet  0  . nitroGLYCERIN (NITROSTAT) 0.4 MG SL tablet Place 0.4 mg under the tongue every 5 (five) minutes as needed. Maximum of 3 doses       . promethazine (PHENERGAN) 12.5 MG tablet TAKE 1 TABLET BY MOUTH EVERY 4-6 HOURS AS NEEDED FOR NAUSEA/VOMITING  30 tablet  2   No family history on file. History   Social History  . Marital Status: Widowed    Spouse Name: N/A    Number of Children: N/A  . Years of Education: N/A   Social History Main Topics  . Smoking status: Never Smoker   . Smokeless tobacco: Not on file  . Alcohol Use: No  . Drug Use: No  . Sexually Active: Not on file   Other Topics Concern  . Not on file   Social History Narrative   Chinese immigrantHousewifeWidowedLives w/ her son   Objective:  Physical Exam: Filed Vitals:   09/03/11 1434  BP: 125/67  Pulse: 71  Temp: 97.1 F (36.2 C)  TempSrc: Oral  Height: 5' (1.524 m)    Weight: 106 lb 1.6 oz (48.127 kg)  SpO2: 100%   General: petite elderly woman with coat on sitting in chair with daughter in law HEENT: PERRL, EOMI, no scleral icterus Ext: warm and well perfused, no pedal edema, legs very thin. Patient does not have tenderness to palpation of her knee, does have tenderness to palpation along her lateral thigh on the left. No tenderness to palpation of anterior or lateral hip. IT band feels tight. Neuro: alert and oriented X3, cranial nerves II-XII grossly intact  Assessment & Plan:

## 2011-09-03 NOTE — Progress Notes (Signed)
Addendum: patient verbalizes appropriate treatment of hypoglycemia.

## 2011-09-07 ENCOUNTER — Encounter (INDEPENDENT_AMBULATORY_CARE_PROVIDER_SITE_OTHER): Payer: Medicare Other

## 2011-09-07 DIAGNOSIS — R002 Palpitations: Secondary | ICD-10-CM

## 2011-09-14 ENCOUNTER — Other Ambulatory Visit (HOSPITAL_COMMUNITY): Payer: Medicare Other

## 2011-09-16 ENCOUNTER — Ambulatory Visit (INDEPENDENT_AMBULATORY_CARE_PROVIDER_SITE_OTHER): Payer: Medicare Other | Admitting: Ophthalmology

## 2011-09-16 ENCOUNTER — Encounter: Payer: Self-pay | Admitting: Ophthalmology

## 2011-09-16 VITALS — BP 118/67 | HR 72 | Temp 97.1°F | Ht 60.0 in | Wt 105.7 lb

## 2011-09-16 DIAGNOSIS — E119 Type 2 diabetes mellitus without complications: Secondary | ICD-10-CM

## 2011-09-16 DIAGNOSIS — I10 Essential (primary) hypertension: Secondary | ICD-10-CM

## 2011-09-16 MED ORDER — INSULIN GLARGINE 100 UNIT/ML ~~LOC~~ SOLN: 35.0000 [IU] | Freq: Every day | SUBCUTANEOUS | Status: DC

## 2011-09-16 MED ORDER — ROSUVASTATIN CALCIUM 20 MG PO TABS: 20.0000 mg | ORAL_TABLET | Freq: Every day | ORAL | Status: DC

## 2011-09-16 MED ORDER — HYDROCHLOROTHIAZIDE 25 MG PO TABS: 25.0000 mg | ORAL_TABLET | Freq: Every day | ORAL | Status: DC

## 2011-09-16 MED ORDER — INSULIN ASPART 100 UNIT/ML ~~LOC~~ SOLN: SUBCUTANEOUS | Status: DC

## 2011-09-16 MED ORDER — HYDRALAZINE HCL 25 MG PO TABS: 25.0000 mg | ORAL_TABLET | Freq: Three times a day (TID) | ORAL | Status: DC

## 2011-09-16 MED ORDER — CARVEDILOL 25 MG PO TABS: 25.0000 mg | ORAL_TABLET | Freq: Two times a day (BID) | ORAL | Status: DC

## 2011-09-16 MED ORDER — CLOPIDOGREL BISULFATE 75 MG PO TABS: 75.0000 mg | ORAL_TABLET | Freq: Every day | ORAL | Status: DC

## 2011-09-16 NOTE — Assessment & Plan Note (Addendum)
Will increase lantus to 35 units since her AM sugars were high except for the first day and her evening sugars were very high. Will start novolog 15 units 30 minutes before evening mealtime since she has had the highest blood sugars in the evening.

## 2011-09-16 NOTE — Patient Instructions (Addendum)
-  Lantus dose with be 35 UNITS -Novolog (new mealtime insulin) will be 15 UNITS 30 minutes before the evening meal -Please continue checking your blood sugar as you have so we can further adjust it

## 2011-09-16 NOTE — Assessment & Plan Note (Signed)
Patient's blood pressure is low today. Should consider stopping amlodipine or hydralazine on next visit.

## 2011-09-16 NOTE — Progress Notes (Signed)
Subjective:   Patient ID: Bianca [name] female   DOB: [birth year] 78 y.o.   MRN: [id number]  HPI: BiancaJessieca T [name] is a 78 y.o. woman here for follow up of diabetic regimen.  She was experiencing some episodes of hypoglycemia so reduced her lantus dose from 60 units to 30 units.  Evening numbers are high, 186-386. Midday is a bit better 120-281 but still high. She is feeling better than before and is no longer feeling sweaty, has better appetite than before. Insulin had been kept at room temperature and not in the refrigerator for several months. Last time A1c was 6.5. Stopped metformin as well due to renal insufficiency. Denies polyuria or polydipsia, doesn't drink many liquids.   Daughter in law is saying that she wasn't eating much protein, mostly broth and vegetables.  Is due for ophthalmology exam as well.  Past Medical History  Diagnosis Date  . Diabetes mellitus, type 2   . GERD (gastroesophageal reflux disease)   . Hepatitis B   . Hyperlipemia   . Hypertension   . Cervical dysplasia   . Atrophic vaginitis   . Spinal stenosis     s/p decompression  . CAD (coronary artery disease) April 2008    s/p CABG;    cath 8/12:  LM patent, pLAD occluded, OM1 30-40%, pOM2 80% and 60% after the anastomosis, pRCA occluded, S-DX occluded (old), S-OM 2 occluded (new), L-LAD patent, dLAD provided collats to the PDA; Med Rx  rec.; consider PCI to OM2 if fails med Rx   Current Outpatient Prescriptions  Medication Sig Dispense Refill  . albuterol (VENTOLIN HFA) 108 (90 BASE) MCG/ACT inhaler Inhale 1 puff into the lungs every 6 (six) hours as needed for wheezing.  1 Inhaler  6  . amLODipine (NORVASC) 5 MG tablet Take 2 tablets (10 mg total) by mouth daily.  60 tablet  11  . aspirin 325 MG EC tablet Take 81 mg by mouth daily.       . carvedilol (COREG) 25 MG tablet Take 25 mg by mouth 2 (two) times daily with a meal.        . clopidogrel (PLAVIX) 75 MG tablet       . CRESTOR 20 MG tablet TAKE 1  TABLET BY MOUTH EVERY DAY  30 tablet  8  . famotidine (PEPCID) 20 MG tablet Take 1 tablet (20 mg total) by mouth 2 (two) times daily.  60 tablet  4  . glucose blood test strip Use to check blood sugar before meals and bedtime and at 3 AM. Dx code: 250.00  150 each  12  . hydrALAZINE (APRESOLINE) 25 MG tablet Take 25 mg by mouth 3 (three) times daily.        . hydrochlorothiazide (HYDRODIURIL) 25 MG tablet TAKE 1 TABLET BY MOUTH EVERY DAY  30 tablet  5  . insulin glargine (LANTUS) 100 UNIT/ML injection Inject 50 Units into the skin at bedtime.  10 mL  0  . Insulin Syringe-Needle U-100 (SB INSULIN SYRINGE) 31G X 5/16" 1 ML MISC Use to inject Lantus once daily       . isosorbide mononitrate (IMDUR) 30 MG 24 hr tablet Take 1 tablet (30 mg total) by mouth daily.  30 tablet  11  . Lancets (ONETOUCH ULTRASOFT) lancets Use to check blood sugar up to 5 time a day. Dx code: 250.00  100 each  12  . nitroGLYCERIN (NITROSTAT) 0.4 MG SL tablet Place 0.4 mg under the tongue every 5 (  five) minutes as needed. Maximum of 3 doses       . promethazine (PHENERGAN) 12.5 MG tablet TAKE 1 TABLET BY MOUTH EVERY 4-6 HOURS AS NEEDED FOR NAUSEA/VOMITING  30 tablet  2   No family history on file. History   Social History  . Marital Status: Widowed    Spouse Name: N/A    Number of Children: N/A  . Years of Education: N/A   Social History Main Topics  . Smoking status: Never Smoker   . Smokeless tobacco: None  . Alcohol Use: No  . Drug Use: No  . Sexually Active: None   Other Topics Concern  . None   Social History Narrative   Chinese immigrantHousewifeWidowedLives w/ her son    Objective:  Physical Exam: Filed Vitals:   09/16/11 1043  BP: 118/67  Pulse: 72  Temp: 97.1 F (36.2 C)  TempSrc: Oral  Height: 5' (1.524 m)  Weight: 105 lb 11.2 oz (47.945 kg)  SpO2: 100%   General: elderly woman sitting on exam table in no apparent distress HEENT: PERRL, EOMI, no scleral icterus Neuro: alert and  oriented X3, cranial nerves II-XII grossly intact  Assessment & Plan:

## 2011-10-01 ENCOUNTER — Ambulatory Visit: Payer: Medicare Other | Admitting: Cardiology

## 2011-10-05 ENCOUNTER — Encounter: Payer: Medicare Other | Admitting: Internal Medicine

## 2011-10-05 ENCOUNTER — Ambulatory Visit (HOSPITAL_COMMUNITY)
Admission: RE | Admit: 2011-10-05 | Discharge: 2011-10-05 | Disposition: A | Discharge status: Home / Self Care | Payer: Medicare Other | Source: Ambulatory Visit | Attending: Internal Medicine | Admitting: Internal Medicine

## 2011-10-05 ENCOUNTER — Ambulatory Visit (INDEPENDENT_AMBULATORY_CARE_PROVIDER_SITE_OTHER): Payer: Medicare Other | Admitting: Internal Medicine

## 2011-10-05 ENCOUNTER — Encounter: Payer: Self-pay | Admitting: Internal Medicine

## 2011-10-05 VITALS — BP 133/69 | HR 64 | Temp 97.1°F | Ht 60.0 in | Wt 107.6 lb

## 2011-10-05 DIAGNOSIS — M25561 Pain in right knee: Secondary | ICD-10-CM | POA: Insufficient documentation

## 2011-10-05 DIAGNOSIS — M899 Disorder of bone, unspecified: Secondary | ICD-10-CM | POA: Insufficient documentation

## 2011-10-05 DIAGNOSIS — M25569 Pain in unspecified knee: Secondary | ICD-10-CM | POA: Insufficient documentation

## 2011-10-05 DIAGNOSIS — M949 Disorder of cartilage, unspecified: Secondary | ICD-10-CM | POA: Insufficient documentation

## 2011-10-05 DIAGNOSIS — X500XXA Overexertion from strenuous movement or load, initial encounter: Secondary | ICD-10-CM | POA: Insufficient documentation

## 2011-10-05 DIAGNOSIS — E119 Type 2 diabetes mellitus without complications: Secondary | ICD-10-CM

## 2011-10-05 LAB — GLUCOSE, CAPILLARY: Glucose-Capillary: 525 mg/dL — ABNORMAL HIGH (ref 70–99)

## 2011-10-05 MED ORDER — INSULIN GLARGINE 100 UNIT/ML ~~LOC~~ SOLN: 40.0000 [IU] | Freq: Every day | SUBCUTANEOUS | Status: DC

## 2011-10-05 MED ORDER — INSULIN ASPART 100 UNIT/ML ~~LOC~~ SOLN: SUBCUTANEOUS | Status: DC

## 2011-10-05 MED ORDER — ASPIRIN EC 81 MG PO TBEC: 81.0000 mg | DELAYED_RELEASE_TABLET | Freq: Every day | ORAL | Status: AC

## 2011-10-05 NOTE — Assessment & Plan Note (Addendum)
Will increase Lantus to 40 units since her morning sugars are still ranging very high. Will have her continue the NovoLog 15 units with her evening meal to help her remember it better. I will have her followup with me in 2 weeks. If her blood glucose is not improved I will start 3 times a day NovoLog therapy. I do not think we have much more room to go up on the Lantus as she has had a low in the 160s on 35 units of Lantus.I asked her to decrease her aspirin to 81 mg daily. She had been taking 325 mg daily.

## 2011-10-05 NOTE — Assessment & Plan Note (Signed)
Likely a bad patellar bone bruise in the setting of mechanical fall. As her pain has seemed to be persistent we will get a plain film to make sure she does not have a patellar fracture. I would expect increased pain and more difficulty walking if this was the case.

## 2011-10-05 NOTE — Progress Notes (Signed)
Subjective:     Patient ID: Bianca [name], female   DOB: 02/15/1931, 78 y.o.   MRN: [id number]  Diabetes She presents for her follow-up diabetic visit. She has type 2 diabetes mellitus. Her disease course has been worsening. There are no hypoglycemic associated symptoms. Pertinent negatives for hypoglycemia include no dizziness. (Patient states she has had no symptoms of hypoglycemia since early May) There are no diabetic associated symptoms. Pertinent negatives for diabetes include no chest pain and no weakness. Current diabetic treatment includes insulin injections. She is currently taking insulin pre-dinner and at bedtime (patient states she gives herself long-acting insulin at that time and short acting insulin before dinner. She states that she does not eever forget doses or give herself extra doses because she has forgotten ). Insulin injections are given by patient. Her highest blood glucose is >200 mg/dl. Her overall blood glucose range is >200 mg/dl. (Glucose ranges on her current regimen of insulin are between a low in the morning of 165 to an AM high of 454. Evening ranges are consistently high in the 300s to 500s.)  Knee Pain  The incident occurred 5 to 7 days ago. The incident occurred at home (She was walking in the garage and tripped over a garden hose.). The injury mechanism was a fall (she fell onto her right knee and right forearm. She states that her sugar was not low at the time.). The pain is present in the right knee. The pain is moderate. The pain has been improving since onset. Pertinent negatives include no inability to bear weight, loss of motion, loss of sensation, muscle weakness, numbness or tingling. Foreign body present: there is an abrasion over the right kneecap. The symptoms are aggravated by movement and palpation. She has tried ice for the symptoms. The treatment provided mild relief.     Review of Systems  Respiratory: Negative for chest tightness and shortness of  breath.   Cardiovascular: Positive for leg swelling. Negative for chest pain.  Neurological: Negative for dizziness, tingling, syncope, weakness and numbness.       Objective:   Physical Exam  Constitutional: She appears well-developed and well-nourished. No distress.  Cardiovascular: Normal rate, regular rhythm and normal heart sounds.   Pulmonary/Chest: Effort normal and breath sounds normal. No respiratory distress. She has no wheezes. She has no rales.  Abdominal: Soft. Bowel sounds are normal.  Musculoskeletal: She exhibits no edema.       Right knee: She exhibits swelling, effusion, ecchymosis, laceration, erythema and bony tenderness. She exhibits normal patellar mobility. tenderness found.       Legs:      Large ecchymosis on the medial thigh just superior to the knee. There is also ecchymosis around the ankle. No effusion swelling or tenderness around the ankle.the right knee does have a small effusion. There is significant crepitus on range of motion. Patient does complain of bony tenderness. She is able to walk without much difficulty.  Skin: Skin is warm and dry. She is not diaphoretic.       Assessment:     Please see problem oriented charting for assessment and plan by problem (best viewed under encounters tab).

## 2011-10-05 NOTE — Patient Instructions (Addendum)
Please start taking 40 units of your lantus insulin daily at bed time. Please continue to check your blood sugar at least twice per day. Continue taking 15 units of short acting insulin with each dinner.  Take only a baby Aspirin 81 mg daily. Do not take full dose aspirin.    We have ordered an xray for your knee.   Return to clinic to see Dr. Candy Sledge in 2 weeks.  Please bring all your medications to your next clinic appointment.

## 2011-10-12 ENCOUNTER — Ambulatory Visit (INDEPENDENT_AMBULATORY_CARE_PROVIDER_SITE_OTHER): Payer: Medicare Other | Admitting: Cardiology

## 2011-10-12 ENCOUNTER — Other Ambulatory Visit (HOSPITAL_COMMUNITY): Payer: Medicare Other

## 2011-10-12 ENCOUNTER — Ambulatory Visit (HOSPITAL_COMMUNITY): Payer: Medicare Other | Attending: Internal Medicine | Admitting: Radiology

## 2011-10-12 ENCOUNTER — Encounter: Payer: Self-pay | Admitting: Cardiology

## 2011-10-12 ENCOUNTER — Ambulatory Visit: Payer: Medicare Other | Admitting: Cardiology

## 2011-10-12 VITALS — BP 120/70 | HR 60 | Ht 59.0 in | Wt 104.1 lb

## 2011-10-12 DIAGNOSIS — I517 Cardiomegaly: Secondary | ICD-10-CM | POA: Insufficient documentation

## 2011-10-12 DIAGNOSIS — I2510 Atherosclerotic heart disease of native coronary artery without angina pectoris: Secondary | ICD-10-CM

## 2011-10-12 DIAGNOSIS — E785 Hyperlipidemia, unspecified: Secondary | ICD-10-CM | POA: Insufficient documentation

## 2011-10-12 DIAGNOSIS — R0989 Other specified symptoms and signs involving the circulatory and respiratory systems: Secondary | ICD-10-CM | POA: Insufficient documentation

## 2011-10-12 DIAGNOSIS — R0609 Other forms of dyspnea: Secondary | ICD-10-CM | POA: Insufficient documentation

## 2011-10-12 DIAGNOSIS — R42 Dizziness and giddiness: Secondary | ICD-10-CM

## 2011-10-12 DIAGNOSIS — I059 Rheumatic mitral valve disease, unspecified: Secondary | ICD-10-CM | POA: Insufficient documentation

## 2011-10-12 DIAGNOSIS — R079 Chest pain, unspecified: Secondary | ICD-10-CM | POA: Insufficient documentation

## 2011-10-12 DIAGNOSIS — I10 Essential (primary) hypertension: Secondary | ICD-10-CM | POA: Insufficient documentation

## 2011-10-12 DIAGNOSIS — E119 Type 2 diabetes mellitus without complications: Secondary | ICD-10-CM | POA: Insufficient documentation

## 2011-10-12 DIAGNOSIS — R002 Palpitations: Secondary | ICD-10-CM | POA: Insufficient documentation

## 2011-10-12 NOTE — Assessment & Plan Note (Signed)
The patient has no new sypmtoms.  No further cardiovascular testing is indicated.  We will continue with aggressive risk reduction and meds as listed.  

## 2011-10-12 NOTE — Assessment & Plan Note (Signed)
The blood pressure is at target. No change in medications is indicated. We will continue with therapeutic lifestyle changes (TLC).  

## 2011-10-12 NOTE — Progress Notes (Signed)
Echocardiogram performed.  

## 2011-10-12 NOTE — Assessment & Plan Note (Signed)
He her LDL was 54 with an HDL of 54. She will continue the medications as listed.

## 2011-10-12 NOTE — Patient Instructions (Signed)
The current medical regimen is effective;  continue present plan and medications.  Follow up in 1 year with Dr Hochrein.  You will receive a letter in the mail 2 months before you are due.  Please call us when you receive this letter to schedule your follow up appointment.  

## 2011-10-12 NOTE — Assessment & Plan Note (Signed)
I don't see a clear etiology for this other than possibly her diabetes. Seems to have resolved. No further workup is suggested.

## 2011-10-12 NOTE — Progress Notes (Signed)
HPI  The patient returns for follow up of CAD. She was seen last month and was describing episodes of dizziness and palpitations in the middle of the night. She did wear an event monitor which demonstrated no arrhythmias. We thought that this might be related to hypoglycemia. She did see her primary doctor and her insulin was changed apparently. Her family and she reports that this seems to have resolve the problem. She's no longer getting this. She walks daily for exercise. The patient denies any new symptoms such as chest discomfort, neck or arm discomfort. There has been no new shortness of breath, PND or orthopnea. There have been no reported palpitations, presyncope or syncope.  Allergies  Allergen Reactions  . Cozaar Other (See Comments)    Sig increase in her creatinine that resolved once it was D/C'd    Current Outpatient Prescriptions  Medication Sig Dispense Refill  . albuterol (VENTOLIN HFA) 108 (90 BASE) MCG/ACT inhaler Inhale 1 puff into the lungs every 6 (six) hours as needed for wheezing.  1 Inhaler  6  . aspirin EC 81 MG tablet Take 1 tablet (81 mg total) by mouth daily.  150 tablet  2  . carvedilol (COREG) 25 MG tablet Take 1 tablet (25 mg total) by mouth 2 (two) times daily with a meal.  90 tablet  5  . clopidogrel (PLAVIX) 75 MG tablet Take 1 tablet (75 mg total) by mouth daily.  90 tablet  3  . famotidine (PEPCID) 20 MG tablet Take 1 tablet (20 mg total) by mouth 2 (two) times daily.  60 tablet  4  . glucose blood test strip Use to check blood sugar before meals and bedtime and at 3 AM. Dx code: 250.00  150 each  12  . hydrALAZINE (APRESOLINE) 25 MG tablet Take 1 tablet (25 mg total) by mouth 3 (three) times daily.  270 tablet  5  . hydrochlorothiazide (HYDRODIURIL) 25 MG tablet Take 1 tablet (25 mg total) by mouth daily.  90 tablet  5  . insulin aspart (NOVOLOG) 100 UNIT/ML injection Take 15 units once daily with your evening meal.  2 vial  12  . insulin glargine  (LANTUS) 100 UNIT/ML injection Inject 40 Units into the skin at bedtime.  10 mL  0  . Insulin Syringe-Needle U-100 (SB INSULIN SYRINGE) 31G X 5/16" 1 ML MISC Use to inject Lantus once daily       . Lancets (ONETOUCH ULTRASOFT) lancets Use to check blood sugar up to 5 time a day. Dx code: 250.00  100 each  12  . nitroGLYCERIN (NITROSTAT) 0.4 MG SL tablet Place 0.4 mg under the tongue every 5 (five) minutes as needed. Maximum of 3 doses       . promethazine (PHENERGAN) 12.5 MG tablet TAKE 1 TABLET BY MOUTH EVERY 4-6 HOURS AS NEEDED FOR NAUSEA/VOMITING  30 tablet  2  . rosuvastatin (CRESTOR) 20 MG tablet Take 1 tablet (20 mg total) by mouth daily.  90 tablet  8    Past Medical History  Diagnosis Date  . Diabetes mellitus, type 2   . GERD (gastroesophageal reflux disease)   . Hepatitis B   . Hyperlipemia   . Hypertension   . Cervical dysplasia   . Atrophic vaginitis   . Spinal stenosis     s/p decompression  . CAD (coronary artery disease) April 2008    s/p CABG;    cath 8/12:  LM patent, pLAD occluded, OM1 30-40%, pOM2 80%  and 60% after the anastomosis, pRCA occluded, S-DX occluded (old), S-OM 2 occluded (new), L-LAD patent, dLAD provided collats to the PDA; Med Rx  rec.; consider PCI to OM2 if fails med Rx    Past Surgical History  Procedure Date  . Back surgery     decompression L3-S1  . Coronary artery bypass graft April 2008  . Total abdominal hysterectomy     cervical dysplasia    ROS:  As stated in the HPI and negative for all other systems.  PHYSICAL EXAM BP 120/70  Pulse 60  Ht 4\' 11"  (1.499 m)  Wt 104 lb 1.9 oz (47.229 kg)  BMI 21.03 kg/m2 GENERAL:  Well appearing HEENT:  Pupils equal round and reactive, fundi not visualized, oral mucosa unremarkable NECK:  No jugular venous distention, waveform within normal limits, carotid upstroke brisk and symmetric, no bruits, no thyromegaly LYMPHATICS:  No cervical, inguinal adenopathy LUNGS:  Clear to auscultation  bilaterally BACK:  No CVA tenderness CHEST:  Well healed sternotomy scar. HEART:  PMI not displaced or sustained,S1 and S2 within normal limits, no S3, no S4, no clicks, no rubs, no murmurs ABD:  Flat, positive bowel sounds normal in frequency in pitch, no bruits, no rebound, no guarding, no midline pulsatile mass, no hepatomegaly, no splenomegaly EXT:  2 plus pulses throughout, no edema, no cyanosis no clubbing   ASSESSMENT AND PLAN

## 2011-10-20 ENCOUNTER — Encounter: Payer: Medicare Other | Admitting: Internal Medicine

## 2011-10-23 ENCOUNTER — Other Ambulatory Visit: Payer: Self-pay | Admitting: Internal Medicine

## 2011-12-03 ENCOUNTER — Other Ambulatory Visit: Payer: Self-pay | Admitting: Internal Medicine

## 2011-12-21 ENCOUNTER — Encounter: Payer: Self-pay | Admitting: Internal Medicine

## 2011-12-21 ENCOUNTER — Ambulatory Visit (INDEPENDENT_AMBULATORY_CARE_PROVIDER_SITE_OTHER): Payer: Medicare Other | Admitting: Internal Medicine

## 2011-12-21 VITALS — BP 119/65 | HR 68 | Temp 97.3°F | Wt 108.0 lb

## 2011-12-21 DIAGNOSIS — N189 Chronic kidney disease, unspecified: Secondary | ICD-10-CM

## 2011-12-21 DIAGNOSIS — N390 Urinary tract infection, site not specified: Secondary | ICD-10-CM

## 2011-12-21 DIAGNOSIS — I10 Essential (primary) hypertension: Secondary | ICD-10-CM

## 2011-12-21 DIAGNOSIS — E119 Type 2 diabetes mellitus without complications: Secondary | ICD-10-CM

## 2011-12-21 DIAGNOSIS — D649 Anemia, unspecified: Secondary | ICD-10-CM

## 2011-12-21 DIAGNOSIS — N184 Chronic kidney disease, stage 4 (severe): Secondary | ICD-10-CM

## 2011-12-21 DIAGNOSIS — Z23 Encounter for immunization: Secondary | ICD-10-CM

## 2011-12-21 LAB — GLUCOSE, CAPILLARY: Glucose-Capillary: >600 mg/dL (ref 70–99)

## 2011-12-21 LAB — COMPREHENSIVE METABOLIC PANEL
Albumin: 3.7 g/dL (ref 3.5–5.2)
BUN: 38 mg/dL — ABNORMAL HIGH (ref 6–23)
Calcium: 9.1 mg/dL (ref 8.4–10.5)
Chloride: 93 mEq/L — ABNORMAL LOW (ref 96–112)
Glucose, Bld: 734 mg/dL (ref 70–99)
Potassium: 4 mEq/L (ref 3.5–5.3)

## 2011-12-21 LAB — RETICULOCYTES: Retic Ct Pct: 1.6 % (ref 0.4–2.3)

## 2011-12-21 LAB — FERRITIN: Ferritin: 36 ng/mL (ref 10–291)

## 2011-12-21 MED ORDER — CIPROFLOXACIN HCL 250 MG PO TABS: 250.0000 mg | ORAL_TABLET | Freq: Every day | ORAL | Status: AC

## 2011-12-21 MED ORDER — INSULIN PEN NEEDLE 31G X 5 MM MISC: 1.0000 [IU] | Freq: Three times a day (TID) | Status: DC

## 2011-12-21 MED ORDER — ZOSTER VACCINE LIVE 19400 UNT/0.65ML ~~LOC~~ SOLR: 0.6500 mL | Freq: Once | SUBCUTANEOUS | Status: AC

## 2011-12-21 MED ORDER — INSULIN ASPART 100 UNIT/ML ~~LOC~~ SOLN: 8.0000 [IU] | Freq: Three times a day (TID) | SUBCUTANEOUS | Status: DC

## 2011-12-21 MED ORDER — LISINOPRIL 5 MG PO TABS: 5.0000 mg | ORAL_TABLET | Freq: Every day | ORAL | Status: DC

## 2011-12-21 NOTE — Assessment & Plan Note (Signed)
GFR = 25 today; HCTZ discontinued; microalb/Cr suggests worsening nephropathy with possible benefit from Lisinopril.    -Start low dose lisinopril -BMET at follow up -Consider referral to nephro

## 2011-12-21 NOTE — Assessment & Plan Note (Signed)
Patient c/o polyuria, dysuria and urinary incontinence.    -UA/micro -empirically Cipro 250mg  daily x 5 days (renally dosed)

## 2011-12-21 NOTE — Assessment & Plan Note (Addendum)
Lab Results  Component Value Date   HGBA1C 12.5 12/21/2011   HGBA1C  Value: 7.5 (NOTE)                                                                       According to the ADA Clinical Practice Recommendations for 2011, when HbA1c is used as a screening test:   >=6.5%   Diagnostic of Diabetes Mellitus           (if abnormal result  is confirmed)  5.7-6.4%   Increased risk of developing Diabetes Mellitus  References:Diagnosis and Classification of Diabetes Mellitus,Diabetes Care,2011,34(Suppl 1):S62-S69 and Standards of Medical Care in         Diabetes - 2011,Diabetes Care,2011,34  (Suppl 1):S11-S61.* 05/16/2010   CREATININE 1.98* 12/21/2011   CREATININE 1.7* 09/01/2011   MICROALBUR 14.03* 12/29/2009   MICRALBCREAT 339.7* 12/29/2009   CHOL 134 09/03/2011   HDL 54 09/03/2011   TRIG 130 09/03/2011    Last eye exam:  Patient to schedule eye exam.    Assessment: Diabetes control: not controlled Progress toward goals: deteriorated Barriers to meeting goals: Difficult to assess; no significant change in diet/exercise/weight; she is taking insulin, but metformin was d/c d/t renal failure in 09/2011, and lantus was decreased to 30u (and subsequently increased to 35u, but patient did not increase); I suspect insulin regimen isn't appropriate  Plan: -During clinic visit, CBG>700, treated with 20u novolog (Patient instructed to self administer while under Lupita Leash Plyler's supervision to confirm that she is injecting insulin appropriately, especially since she was concerned that she would not be able to draw up 8 units.  Diabetes treatment: Patient weights 108 lbs; her calculated dose based on 1unit/kg (assuming insulin resistence) is approx lantus 25u qHS and Novolog 8units before each meal Refer to: diabetes educator for self-management training and ; during visit, Norm Parcel met with patient to confirm patient understands how to administer insulin; we decided that for the meal time insulin, a pen would be most  appropriate, but we will keep lantus in vial Instruction/counseling given: reminded to get eye exam, reminded to bring blood glucose meter & log to each visit, reminded to bring medications to each visit and discussed diet  -Patient to return in 1 week to review CBGs and be sure she has an adequate insulin regimen.  Patient was instructed to check CBGS three times daily prior to each meal.

## 2011-12-21 NOTE — Progress Notes (Signed)
Subjective:   Patient ID: Bianca [name] female   DOB: [birth year] 79 y.o.   MRN: [id number]  HPI: [name] is a 78 y.o. woman with h/o CRF and DM who presents today with c/o hyperglycemia.  1. DM: Since May 2013, patient has noted significant increases in CBG.  Review of glucometer reveals majority of CBGs >300.  She reports there have been several readings unable to be read. She notes blurry vision, numbness/tingling of extremities, dizziness, increased sleepiness, polyuria, dysuria, urinary incontinence and polydipsia.  She reports dizziness described as room spinning. No symptoms of hypoglycemia.  No changes in diet (diet generally composed of soups, vegetables, white rice).  No desserts.  No history of recent steroid use.  No cough.  No fevers.  No rash/skin infections. Normal appetite.     Past Medical History  Diagnosis Date  . Diabetes mellitus, type 2   . GERD (gastroesophageal reflux disease)   . Hepatitis B   . Hyperlipemia   . Hypertension   . Cervical dysplasia   . Atrophic vaginitis   . Spinal stenosis     s/p decompression  . CAD (coronary artery disease) April 2008    s/p CABG;    cath 8/12:  LM patent, pLAD occluded, OM1 30-40%, pOM2 80% and 60% after the anastomosis, pRCA occluded, S-DX occluded (old), S-OM 2 occluded (new), L-LAD patent, dLAD provided collats to the PDA; Med Rx  rec.; consider PCI to OM2 if fails med Rx   Current Outpatient Prescriptions  Medication Sig Dispense Refill  . aspirin EC 81 MG tablet Take 1 tablet (81 mg total) by mouth daily.  150 tablet  2  . carvedilol (COREG) 25 MG tablet Take 1 tablet (25 mg total) by mouth 2 (two) times daily with a meal.  90 tablet  5  . famotidine (PEPCID) 20 MG tablet TAKE 1 TABLET BY MOUTH TWICE A DAY  60 tablet  4  . glucose blood test strip Use to check blood sugar before meals and bedtime and at 3 AM. Dx code: 250.00  150 each  12  . hydrALAZINE (APRESOLINE) 25 MG tablet Take 1 tablet (25 mg total) by  mouth 3 (three) times daily.  270 tablet  5  . insulin glargine (LANTUS) 100 UNIT/ML injection Inject 40 Units into the skin at bedtime.  10 mL  0  . Insulin Syringe-Needle U-100 (SB INSULIN SYRINGE) 31G X 5/16" 1 ML MISC Use to inject Lantus once daily       . Lancets (ONETOUCH ULTRASOFT) lancets Use to check blood sugar up to 5 time a day. Dx code: 250.00  100 each  12  . rosuvastatin (CRESTOR) 20 MG tablet Take 1 tablet (20 mg total) by mouth daily.  90 tablet  8  . DISCONTD: insulin aspart (NOVOLOG) 100 UNIT/ML injection Take 15 units once daily with your evening meal.  2 vial  12  . albuterol (VENTOLIN HFA) 108 (90 BASE) MCG/ACT inhaler Inhale 1 puff into the lungs every 6 (six) hours as needed for wheezing.  1 Inhaler  6  . ciprofloxacin (CIPRO) 250 MG tablet Take 1 tablet (250 mg total) by mouth daily.  5 tablet  0  . clopidogrel (PLAVIX) 75 MG tablet Take 1 tablet (75 mg total) by mouth daily.  90 tablet  3  . insulin aspart (NOVOLOG PENFILL) 100 UNIT/ML injection Inject 8 Units into the skin 3 (three) times daily before meals.  30 mL  12  . Insulin  Pen Needle (B-D UF III MINI PEN NEEDLES) 31G X 5 MM MISC 1 Units by Does not apply route 3 (three) times daily with meals.  100 each  11  . lisinopril (PRINIVIL,ZESTRIL) 5 MG tablet Take 1 tablet (5 mg total) by mouth daily.  30 tablet  11  . nitroGLYCERIN (NITROSTAT) 0.4 MG SL tablet Place 0.4 mg under the tongue every 5 (five) minutes as needed. Maximum of 3 doses       . promethazine (PHENERGAN) 12.5 MG tablet TAKE 1 TABLET BY MOUTH EVERY 4-6 HOURS AS NEEDED FOR NAUSEA/VOMITING  30 tablet  2  . zoster vaccine live, PF, (ZOSTAVAX) 40981 UNT/0.65ML injection Inject 19,400 Units into the skin once.  1 each  0   No family history on file.  History   Social History  . Marital Status: Widowed    Spouse Name: N/A    Number of Children: N/A  . Years of Education: N/A   Social History Main Topics  . Smoking status: Never Smoker   .  Smokeless tobacco: None  . Alcohol Use: No  . Drug Use: No  . Sexually Active: None   Other Topics Concern  . None   Social History Narrative   Chinese immigrantHousewifeWidowedLives w/ her son   Review of Systems: Constitutional: Denies fever, chills, diaphoresis, appetite change.  HEENT: Denies photophobia, eye pain, redness, hearing loss, ear pain, congestion, sore throat, rhinorrhea, sneezing, mouth sores, trouble swallowing, neck pain, neck stiffness and tinnitus.   Respiratory: Denies SOB, DOE, cough, chest tightness,  and wheezing.   Cardiovascular: Denies chest pain, palpitations and leg swelling.  Gastrointestinal: Denies nausea, vomiting, abdominal pain, diarrhea, constipation, blood in stool and abdominal distention.  Genitourinary: as per HPI Musculoskeletal: Denies myalgias, back pain, joint swelling, arthralgias and gait problem.  Skin: Denies pallor, rash and wound.  Neurological: Denies seizures, syncope, weakness, light-headedness and headaches.   Objective:  Physical Exam: Filed Vitals:   12/21/11 1456  BP: 119/65  Pulse: 68  Temp: 97.3 F (36.3 C)  TempSrc: Oral  Weight: 108 lb (48.988 kg)   Constitutional: Vital signs reviewed.  Patient is a well-developed and well-nourished woman in no acute distress and cooperative with exam. Eyes: PERRL, EOMI, conjunctivae normal, No scleral icterus.  Cardiovascular: RRR, S1 normal, S2 normal, no MRG, pulses symmetric and intact bilaterally Pulmonary/Chest: CTAB, no wheezes, rales, or rhonchi Abdominal: Soft. Non-tender, non-distended, bowel sounds are normal, no masses, organomegaly, or guarding present.  Musculoskeletal: No joint deformities, erythema, or stiffness, ROM full and no nontender Hematology: no cervical adenopathy.  Neurological: A&O x3, Strength is normal and symmetric bilaterally, cranial nerve II-XII are grossly intact, no focal motor deficit, sensory intact to light touch bilaterally.  Skin: Warm, dry  and intact. No rash, cyanosis, or clubbing.    Assessment & Plan:  Case and care discussed with Dr. Kem Kays.  Please see problem oriented charting for further detail. Patient to return in 1 week to f/u CBGs and BP.

## 2011-12-21 NOTE — Patient Instructions (Addendum)
-  Please complete Ciprofloxacin 250mg , 1 tablet daily, for 5 days.  -Please stop taking hydrochlorothiazide.  Start taking Lisinopril 5mg  daily.  -Please change insulin to how we discussed (Novolog 8 units before each meal and Lantus 25 units before bed).  -Please check blood sugar three times daily (before each meal) and bring meter with you to next visit.  Please be sure to bring all of your medications with you to every visit.  Should you have any new or worsening symptoms, please be sure to call the clinic at [phone].

## 2011-12-21 NOTE — Assessment & Plan Note (Signed)
Lab Results  Component Value Date   NA 129* 12/21/2011   K 4.0 12/21/2011   CL 93* 12/21/2011   CO2 23 12/21/2011   BUN 38* 12/21/2011   CREATININE 1.98* 12/21/2011   CREATININE 1.7* 09/01/2011    BP Readings from Last 3 Encounters:  12/21/11 119/65  10/12/11 120/70  10/05/11 133/69    Assessment: Hypertension control:  controlled  Progress toward goals:  at goal Barriers to meeting goals:  no barriers identified  Plan: Hypertension treatment:  -Discontinue HCTZ because GFR~25, and HCTZ is generally ineffective with GFR<30; If needed we can start lasix, but patient's BP is under excellent control at present -Start Lisinopril 5mg  in setting of chronic renal failure (she will benefit from renal protection as her most recent microalb/Cr revealed worsening nephropathy; no known allergy/reaction to ACEI, and when ARB was d/c d/t worsening renal function, it seems that the Cr=1.2 was an outlier, and her baseline seems to be 1.6-1.9 - she never developed acute renal failure). Closely monitor in setting of Grade 2 diastolic dysfunction (echo in 10/5782 b/c of dyspnea & palpitations) -Continue Carvedilol 25mg  BID -Continue Hydralazine 25mg  TID  -Repeat BMET in 3-4 weeks

## 2011-12-22 LAB — URINALYSIS, MICROSCOPIC ONLY
Bacteria, UA: NONE SEEN
Crystals: NONE SEEN

## 2011-12-22 LAB — URINALYSIS, ROUTINE W REFLEX MICROSCOPIC
Bilirubin Urine: NEGATIVE
Glucose, UA: >1000 mg/dL — AB
Ketones, ur: NEGATIVE mg/dL
Protein, ur: NEGATIVE mg/dL

## 2011-12-22 LAB — MICROALBUMIN / CREATININE URINE RATIO: Microalb, Ur: 4.73 mg/dL — ABNORMAL HIGH (ref 0.00–1.89)

## 2011-12-22 MED ORDER — "INSULIN SYRINGE-NEEDLE U-100 31G X 5/16"" 1 ML MISC": 1.0000 [IU] | Freq: Three times a day (TID) | Status: DC

## 2011-12-22 MED ORDER — INSULIN ASPART 100 UNIT/ML ~~LOC~~ SOLN: 8.0000 [IU] | Freq: Three times a day (TID) | SUBCUTANEOUS | Status: DC

## 2011-12-22 MED ORDER — INSULIN PEN NEEDLE 31G X 5 MM MISC: 1.0000 [IU] | Freq: Three times a day (TID) | Status: DC

## 2011-12-22 MED ORDER — "INSULIN SYRINGE-NEEDLE U-100 31G X 5/16"" 1 ML MISC": Status: DC

## 2011-12-22 NOTE — Addendum Note (Signed)
Addended by: Belia Heman on: 12/22/2011 02:10 PM   Modules accepted: Orders

## 2011-12-22 NOTE — Addendum Note (Signed)
Addended by: Belia Heman on: 12/22/2011 10:46 AM   Modules accepted: Orders

## 2011-12-25 ENCOUNTER — Other Ambulatory Visit: Payer: Self-pay | Admitting: Internal Medicine

## 2011-12-28 NOTE — Addendum Note (Signed)
Addended by: Neomia Dear on: 12/28/2011 05:51 PM   Modules accepted: Orders

## 2012-01-04 ENCOUNTER — Encounter: Payer: Self-pay | Admitting: Internal Medicine

## 2012-01-04 ENCOUNTER — Ambulatory Visit (INDEPENDENT_AMBULATORY_CARE_PROVIDER_SITE_OTHER): Payer: Medicare Other | Admitting: Internal Medicine

## 2012-01-04 VITALS — BP 143/74 | HR 71 | Temp 98.6°F | Ht 60.0 in | Wt 112.3 lb

## 2012-01-04 DIAGNOSIS — N390 Urinary tract infection, site not specified: Secondary | ICD-10-CM

## 2012-01-04 DIAGNOSIS — N189 Chronic kidney disease, unspecified: Secondary | ICD-10-CM

## 2012-01-04 DIAGNOSIS — Z0000 Encounter for general adult medical examination without abnormal findings: Secondary | ICD-10-CM

## 2012-01-04 DIAGNOSIS — E119 Type 2 diabetes mellitus without complications: Secondary | ICD-10-CM

## 2012-01-04 DIAGNOSIS — I10 Essential (primary) hypertension: Secondary | ICD-10-CM

## 2012-01-04 MED ORDER — FUROSEMIDE 20 MG PO TABS: 20.0000 mg | ORAL_TABLET | Freq: Every day | ORAL | Status: DC

## 2012-01-04 MED ORDER — INSULIN GLARGINE 100 UNIT/ML ~~LOC~~ SOLN: SUBCUTANEOUS | Status: DC

## 2012-01-04 MED ORDER — HYDRALAZINE HCL 25 MG PO TABS: 25.0000 mg | ORAL_TABLET | Freq: Three times a day (TID) | ORAL | Status: DC

## 2012-01-04 NOTE — Assessment & Plan Note (Signed)
She is currently taking amlodipine 5 mg daily, lisinopril 5 mg daily, hydralazine 25 mg 3 times a day and carvedilol 25 mg twice a day. Patient is also on isosorbide monitrite. Today her blood pressure is 143/74. Amlodipine and isosorbide mopnitrite were not on the medication list, but the patient brought in all her medication bottles, which include those 2 medications. I confirmed with patient that she is actually taking amlodipine and isosorbide mononitrile. Today her blood pressure is 143/74. We continue current regimen. In addition Lasix was added to her regimen.

## 2012-01-04 NOTE — Assessment & Plan Note (Signed)
Patient has chronic renal failure with a baseline creatinine at 1.6-1.9. Due to her GFR<30, her hydrochlorothiazide was discontinued in last visit.  Patient developed bilateral lower leg edema and mild shortness of breath, indicating fluid retention. . Her symptoms is most likely due to chronic renal failure and is less likely due to congestive heart failure giving her recent normal EF on 2-D echo at 10/12/11. Will treat the patient with Lasix, 20 mg daily. We'll check her BMP today. Will give patient a referral to nephrology.

## 2012-01-04 NOTE — Patient Instructions (Signed)
1. I changed you Novolog insulin dosage. Please take 8 Units of Novolog before breakfast and Lunch, and 10 Units of Novolog before dinner. You should still take 25 Units of Lantus at bed time each day. 2. Since you have 2 episodes of hypoglycemia (low blood sugar), please take some snack in the early morning from now. You should take your breakfast little early than now ( between 6:00 AM to 8:00 AM). 2. I added a new medication, Lasix 20 mg daily for you for your leg edema. 3. If you have worsening of your symptoms or new symptoms arise, please call the clinic ([phone]), or go to the ER immediately if symptoms are severe.

## 2012-01-04 NOTE — Assessment & Plan Note (Signed)
Patient is currently taking ciprofloxacin. She still has some symptoms which could be due to either incomplete treatment or residual symptoms after treatment. Patient doesn't have a fever or chills. We'll let patient complete current treatment and do urinalysis and urine culture today.

## 2012-01-04 NOTE — Assessment & Plan Note (Signed)
-  Flu vaccination, foot examination, A1c, lipid profile, pneumococcal vaccination are up-to-date. -Patient refused eye examination, colonoscopy,TDaP, will postpone.  -Zostavax was prescribed in her previous visit.

## 2012-01-04 NOTE — Assessment & Plan Note (Addendum)
Patient's CBG is still not at target. Her CBG before dinner is significantly elevated. Her morning CBG is fluctuating, most likely due to her eating habits.  I discussed with Dr. Eben Burow, who suggested to educate patient to change her eating habit. I suggested patient to eat snack in the early morning or eat her breakfast little earlier than now ( between 6:00 AM to 8:00 AM). Will increase her Novolog dose to 10 Units before dinners. Will adjust her insulin in further in her next visit.

## 2012-01-04 NOTE — Progress Notes (Signed)
Patient ID: Bianca [name], female   DOB: 1931-03-15, 78 y.o.   MRN: [id number]  Subjective:   Patient ID: Bianca [name] female   DOB: 04-02-1931 78 y.o.   MRN: [id number]  HPI: [name] is a 78 y.o. with past medical history as outlined below, who presents for a followup visit.  Patient was seen in the clinic at 12/21/11. Her medications was adjusted in that visit, including discontinuation of hydrochlorothiazide, changes of NovoLog dosage and addition of lisinopril. Patient reports that she is taking her medications regularly. She brought in all her medication bottles with her.   1). DM-II: Patient's diabetes was poorly controlled. Her A1c was 12.5 at 12/21/11. Her diabetes medication was adjusted on 12/21/11. Currently she is taking 25 units of Lantus at bedtime and 8 units of NovoLog before each meals. Her meter record shows that her CBG is between 59 to 200 around 8:00 to 9:00 AM, and 200 to 500 before dinners. Patient reports he had 2 episode of possible hypoglycemia (sweating and palpitation) at 9/1 and 9/2 in the early morning, when she did not eat any food. She reports that she usually eats her breakfast at 10 to 11:00 AM, lunches at 1:00 PM and dinners at 6:00 PM.   2). Leg edema: patient reports that she has worsening leg edema in recent 2 weeks. She has mild SOB and minimal dry cough. Her SOB is getting worse with walking. She denies chest pain.  She dose not have leg pain or tenderness over calf areas. She did not have traveling recently.   3.) UTI: patient had UTI and has been treated with Ciprofloxacin 250 mg daily (renal dose). Our record showed that she was given ciprofloxacin for 5 days, but patient reporting that she has prescription for 11 days. She is currently taking ciprofloxacin and she still has ciprofloxacin pills for another 3 days. She said her symptoms it is slightly better, but still has mild dysuria and burning sensation on urination. She does not have fever or chills, no  pain in flank areas.  4.) HTN: She is currently taking amlodipine 5 mg daily, lisinopril 5 mg daily, hydralazine 25 mg 3 times a day and carvedilol 25 mg twice a day. Patient is also on isosorbide monitrite. Today her blood pressure is 143/74.     Past Medical History  Diagnosis Date  . Diabetes mellitus, type 2   . GERD (gastroesophageal reflux disease)   . Hepatitis B   . Hyperlipemia   . Hypertension   . Cervical dysplasia   . Atrophic vaginitis   . Spinal stenosis     s/p decompression  . CAD (coronary artery disease) April 2008    s/p CABG;    cath 8/12:  LM patent, pLAD occluded, OM1 30-40%, pOM2 80% and 60% after the anastomosis, pRCA occluded, S-DX occluded (old), S-OM 2 occluded (new), L-LAD patent, dLAD provided collats to the PDA; Med Rx  rec.; consider PCI to OM2 if fails med Rx   Current Outpatient Prescriptions  Medication Sig Dispense Refill  . albuterol (VENTOLIN HFA) 108 (90 BASE) MCG/ACT inhaler Inhale 1 puff into the lungs every 6 (six) hours as needed for wheezing.  1 Inhaler  6  . amLODipine (NORVASC) 5 MG tablet Take 5 mg by mouth daily.      Marland Kitchen aspirin EC 81 MG tablet Take 1 tablet (81 mg total) by mouth daily.  150 tablet  2  . carvedilol (COREG) 25 MG tablet Take 1  tablet (25 mg total) by mouth 2 (two) times daily with a meal.  90 tablet  5  . clopidogrel (PLAVIX) 75 MG tablet Take 1 tablet (75 mg total) by mouth daily.  90 tablet  3  . famotidine (PEPCID) 20 MG tablet TAKE 1 TABLET BY MOUTH TWICE A DAY  60 tablet  4  . glucose blood test strip Use to check blood sugar before meals and bedtime and at 3 AM. Dx code: 250.00  150 each  12  . hydrALAZINE (APRESOLINE) 25 MG tablet Take 1 tablet (25 mg total) by mouth 3 (three) times daily.  270 tablet  5  . insulin aspart (NOVOLOG FLEXPEN) 100 UNIT/ML injection Inject 8 Units into the skin 3 (three) times daily before meals.  15 mL  12  . Insulin Pen Needle (B-D UF III MINI PEN NEEDLES) 31G X 5 MM MISC 1 Units by  Does not apply route 3 (three) times daily with meals. Dx code 250.00  100 each  11  . Insulin Syringe-Needle U-100 (SB INSULIN SYRINGE) 31G X 5/16" 1 ML MISC Use to inject insulin 1 time daily. Diagnosis code 250.00  100 each  11  . isosorbide mononitrate (IMDUR) 30 MG 24 hr tablet Take 30 mg by mouth daily.      . Lancets (ONETOUCH ULTRASOFT) lancets Use to check blood sugar up to 5 time a day. Dx code: 250.00  100 each  12  . lisinopril (PRINIVIL,ZESTRIL) 5 MG tablet Take 1 tablet (5 mg total) by mouth daily.  30 tablet  11  . nitroGLYCERIN (NITROSTAT) 0.4 MG SL tablet Place 0.4 mg under the tongue every 5 (five) minutes as needed. Maximum of 3 doses       . rosuvastatin (CRESTOR) 20 MG tablet Take 1 tablet (20 mg total) by mouth daily.  90 tablet  8  . DISCONTD: hydrALAZINE (APRESOLINE) 25 MG tablet Take 1 tablet (25 mg total) by mouth 3 (three) times daily.  270 tablet  5  . DISCONTD: hydrALAZINE (APRESOLINE) 25 MG tablet Take 1 tablet (25 mg total) by mouth 3 (three) times daily.  270 tablet  5  . furosemide (LASIX) 20 MG tablet Take 1 tablet (20 mg total) by mouth daily.  30 tablet  5  . insulin glargine (LANTUS) 100 UNIT/ML injection Please take 8 Units of Novolog before breakfast and Lunch, and 10 Units of Novolog before dinner.  10 mL  3  . promethazine (PHENERGAN) 12.5 MG tablet TAKE 1 TABLET BY MOUTH EVERY 4-6 HOURS AS NEEDED FOR NAUSEA/VOMITING  30 tablet  2  . DISCONTD: furosemide (LASIX) 20 MG tablet Take 1 tablet (20 mg total) by mouth daily.  30 tablet  11   No family history on file. History   Social History  . Marital Status: Widowed    Spouse Name: N/A    Number of Children: N/A  . Years of Education: N/A   Social History Main Topics  . Smoking status: Never Smoker   . Smokeless tobacco: None  . Alcohol Use: No  . Drug Use: No  . Sexually Active: None   Other Topics Concern  . None   Social History Narrative   Chinese immigrantHousewifeWidowedLives w/ her son    Review of Systems:  General: no fevers, chills, no changes in body weight, no changes in appetite Skin: no rash HEENT: no blurry vision, hearing changes or sore throat Pulm: no dyspnea, coughing, wheezing CV: no chest pain, has mild SOB and  dry cough Abd: no nausea/vomiting, abdominal pain, diarrhea/constipation GU: has mild dysuria, No hematuria or polyuria Ext:  Has leg edema. Neuro: no weakness, numbness, or tingling   Objective:  Physical Exam: Filed Vitals:   01/04/12 1515  BP: 143/74  Pulse: 71  Temp: 98.6 F (37 C)  TempSrc: Oral  Height: 5' (1.524 m)  Weight: 112 lb 4.8 oz (50.939 kg)  SpO2: 98%   General: resting in bed, not in acute distress HEENT: PERRL, EOMI, no scleral icterus Cardiac: S1/S2, RRR, No murmurs, gallops or rubs Pulm: Good air movement bilaterally, Clear to auscultation bilaterally, No rales, wheezing, rhonchi or rubs. Abd: Soft,  nondistended, nontender, no rebound pain, no organomegaly, BS present. There is CVA tenderness. Ext: No rashes,  2+DP/PT pulse bilaterally. Has 2+ pitting edema in right leg and ankle, and 1+ pitting edema in the left leg and ankle. There is no tenderness, redness or swelling in calf areas.  Musculoskeletal: No joint deformities, erythema, or stiffness, ROM full and no nontender Skin: no rashes. No skin bruise. Neuro: alert and oriented X3, cranial nerves II-XII grossly intact, muscle strength 5/5 in all extremeties,  sensation to light touch intact.    Assessment & Plan:

## 2012-01-05 ENCOUNTER — Other Ambulatory Visit (INDEPENDENT_AMBULATORY_CARE_PROVIDER_SITE_OTHER): Payer: Medicare Other

## 2012-01-05 DIAGNOSIS — N390 Urinary tract infection, site not specified: Secondary | ICD-10-CM

## 2012-01-05 DIAGNOSIS — I10 Essential (primary) hypertension: Secondary | ICD-10-CM

## 2012-01-05 LAB — BASIC METABOLIC PANEL WITH GFR
BUN: 24 mg/dL — ABNORMAL HIGH (ref 6–23)
Creat: 1.73 mg/dL — ABNORMAL HIGH (ref 0.50–1.10)
GFR, Est African American: 32 mL/min — ABNORMAL LOW
GFR, Est Non African American: 28 mL/min — ABNORMAL LOW
Potassium: 4.4 mEq/L (ref 3.5–5.3)

## 2012-01-06 LAB — URINALYSIS, ROUTINE W REFLEX MICROSCOPIC
Hgb urine dipstick: NEGATIVE
Ketones, ur: NEGATIVE mg/dL
Nitrite: POSITIVE — AB
Protein, ur: NEGATIVE mg/dL
Urobilinogen, UA: 0.2 mg/dL (ref 0.0–1.0)

## 2012-01-06 LAB — URINALYSIS, MICROSCOPIC ONLY
Bacteria, UA: NONE SEEN
Casts: NONE SEEN

## 2012-01-08 ENCOUNTER — Other Ambulatory Visit: Payer: Self-pay | Admitting: Internal Medicine

## 2012-01-08 DIAGNOSIS — N390 Urinary tract infection, site not specified: Secondary | ICD-10-CM

## 2012-01-08 MED ORDER — AMOXICILLIN-POT CLAVULANATE 500-125 MG PO TABS: 1.0000 | ORAL_TABLET | Freq: Two times a day (BID) | ORAL | Status: DC

## 2012-01-08 NOTE — Progress Notes (Signed)
Patient's UA showed UTI and Urine culture showed she is sensitive to Unasyn. Since she has CKD with GFR of 28, I discussed with pharmacist who suggested to treat patient with Augmentin 500/125 Bid for 7 days. I sent a prescription to her pharmacy. I called patient and spoke with her daughter in law (patient does not speak [place]), who will help to pick up the medication for patient.   Lorretta Harp, MD PGY2, Internal Medicine Teaching Service Pager: [phone]

## 2012-01-15 ENCOUNTER — Inpatient Hospital Stay (HOSPITAL_COMMUNITY)
Admission: EM | Admit: 2012-01-15 | Discharge: 2012-01-16 | DRG: 204 | Disposition: A | Discharge status: Home / Self Care | Payer: Medicare Other | Attending: Internal Medicine | Admitting: Internal Medicine

## 2012-01-15 ENCOUNTER — Emergency Department (HOSPITAL_COMMUNITY): Payer: Medicare Other

## 2012-01-15 ENCOUNTER — Encounter (HOSPITAL_COMMUNITY): Payer: Self-pay

## 2012-01-15 ENCOUNTER — Other Ambulatory Visit: Payer: Self-pay

## 2012-01-15 DIAGNOSIS — N185 Chronic kidney disease, stage 5: Secondary | ICD-10-CM | POA: Diagnosis present

## 2012-01-15 DIAGNOSIS — R05 Cough: Principal | ICD-10-CM

## 2012-01-15 DIAGNOSIS — N179 Acute kidney failure, unspecified: Secondary | ICD-10-CM | POA: Diagnosis present

## 2012-01-15 DIAGNOSIS — N184 Chronic kidney disease, stage 4 (severe): Secondary | ICD-10-CM | POA: Diagnosis present

## 2012-01-15 DIAGNOSIS — I10 Essential (primary) hypertension: Secondary | ICD-10-CM

## 2012-01-15 DIAGNOSIS — I428 Other cardiomyopathies: Secondary | ICD-10-CM | POA: Diagnosis present

## 2012-01-15 DIAGNOSIS — E119 Type 2 diabetes mellitus without complications: Secondary | ICD-10-CM | POA: Diagnosis present

## 2012-01-15 DIAGNOSIS — N390 Urinary tract infection, site not specified: Secondary | ICD-10-CM | POA: Diagnosis present

## 2012-01-15 DIAGNOSIS — R0789 Other chest pain: Secondary | ICD-10-CM | POA: Diagnosis present

## 2012-01-15 DIAGNOSIS — I2510 Atherosclerotic heart disease of native coronary artery without angina pectoris: Secondary | ICD-10-CM | POA: Diagnosis present

## 2012-01-15 DIAGNOSIS — R079 Chest pain, unspecified: Secondary | ICD-10-CM | POA: Diagnosis present

## 2012-01-15 DIAGNOSIS — K219 Gastro-esophageal reflux disease without esophagitis: Secondary | ICD-10-CM

## 2012-01-15 DIAGNOSIS — Z951 Presence of aortocoronary bypass graft: Secondary | ICD-10-CM

## 2012-01-15 DIAGNOSIS — N289 Disorder of kidney and ureter, unspecified: Secondary | ICD-10-CM

## 2012-01-15 DIAGNOSIS — R059 Cough, unspecified: Principal | ICD-10-CM | POA: Diagnosis present

## 2012-01-15 DIAGNOSIS — E1122 Type 2 diabetes mellitus with diabetic chronic kidney disease: Secondary | ICD-10-CM | POA: Diagnosis present

## 2012-01-15 DIAGNOSIS — E785 Hyperlipidemia, unspecified: Secondary | ICD-10-CM | POA: Diagnosis present

## 2012-01-15 DIAGNOSIS — I129 Hypertensive chronic kidney disease with stage 1 through stage 4 chronic kidney disease, or unspecified chronic kidney disease: Secondary | ICD-10-CM | POA: Diagnosis present

## 2012-01-15 LAB — COMPREHENSIVE METABOLIC PANEL
ALT: 13 U/L (ref 0–35)
AST: 17 U/L (ref 0–37)
Alkaline Phosphatase: 72 U/L (ref 39–117)
CO2: 23 mEq/L (ref 19–32)
Calcium: 10.1 mg/dL (ref 8.4–10.5)
GFR calc Af Amer: 23 mL/min — ABNORMAL LOW (ref >90)
Glucose, Bld: 239 mg/dL — ABNORMAL HIGH (ref 70–99)
Potassium: 3.4 mEq/L — ABNORMAL LOW (ref 3.5–5.1)
Sodium: 136 mEq/L (ref 135–145)
Total Protein: 8 g/dL (ref 6.0–8.3)

## 2012-01-15 LAB — BASIC METABOLIC PANEL
BUN: 39 mg/dL — ABNORMAL HIGH (ref 6–23)
Chloride: 103 mEq/L (ref 96–112)
Creatinine, Ser: 1.94 mg/dL — ABNORMAL HIGH (ref 0.50–1.10)
GFR calc Af Amer: 27 mL/min — ABNORMAL LOW (ref >90)
Glucose, Bld: 211 mg/dL — ABNORMAL HIGH (ref 70–99)

## 2012-01-15 LAB — CBC WITH DIFFERENTIAL/PLATELET
Basophils Absolute: 0 10*3/uL (ref 0.0–0.1)
Eosinophils Absolute: 0.1 10*3/uL (ref 0.0–0.7)
Eosinophils Relative: 1 % (ref 0–5)
Lymphocytes Relative: 5 % — ABNORMAL LOW (ref 12–46)
Lymphs Abs: 0.7 10*3/uL (ref 0.7–4.0)
Neutrophils Relative %: 88 % — ABNORMAL HIGH (ref 43–77)
Platelets: 131 10*3/uL — ABNORMAL LOW (ref 150–400)
RBC: 3.65 MIL/uL — ABNORMAL LOW (ref 3.87–5.11)
RDW: 13.8 % (ref 11.5–15.5)
WBC: 14.5 10*3/uL — ABNORMAL HIGH (ref 4.0–10.5)

## 2012-01-15 LAB — PROTIME-INR
INR: 1.06 (ref 0.00–1.49)
Prothrombin Time: 14 seconds (ref 11.6–15.2)

## 2012-01-15 LAB — PRO B NATRIURETIC PEPTIDE: Pro B Natriuretic peptide (BNP): 362 pg/mL (ref 0–450)

## 2012-01-15 LAB — URINALYSIS, ROUTINE W REFLEX MICROSCOPIC
Glucose, UA: 250 mg/dL — AB
Hgb urine dipstick: NEGATIVE
Leukocytes, UA: NEGATIVE
Specific Gravity, Urine: 1.014 (ref 1.005–1.030)
pH: 7 (ref 5.0–8.0)

## 2012-01-15 LAB — URINE MICROSCOPIC-ADD ON

## 2012-01-15 LAB — POCT I-STAT TROPONIN I: Troponin i, poc: 0 ng/mL (ref 0.00–0.08)

## 2012-01-15 MED ORDER — CARVEDILOL 25 MG PO TABS
25.0000 mg | ORAL_TABLET | Freq: Two times a day (BID) | ORAL | Status: DC
  Administered 2012-01-15 – 2012-01-16 (×2): 25 mg via ORAL
  Filled 2012-01-15 (×4): qty 1

## 2012-01-15 MED ORDER — AMLODIPINE BESYLATE 5 MG PO TABS
5.0000 mg | ORAL_TABLET | Freq: Every day | ORAL | Status: DC
  Filled 2012-01-15 (×2): qty 1

## 2012-01-15 MED ORDER — FOSFOMYCIN TROMETHAMINE 3 G PO PACK
3.0000 g | PACK | Freq: Once | ORAL | Status: DC
  Filled 2012-01-15: qty 3

## 2012-01-15 MED ORDER — INSULIN GLARGINE 100 UNIT/ML ~~LOC~~ SOLN
5.0000 [IU] | Freq: Every day | SUBCUTANEOUS | Status: DC
  Administered 2012-01-15: 5 [IU] via SUBCUTANEOUS

## 2012-01-15 MED ORDER — CLOPIDOGREL BISULFATE 75 MG PO TABS
75.0000 mg | ORAL_TABLET | Freq: Every day | ORAL | Status: DC
  Administered 2012-01-16: 75 mg via ORAL
  Filled 2012-01-15: qty 1

## 2012-01-15 MED ORDER — ONDANSETRON HCL 4 MG/2ML IJ SOLN
4.0000 mg | Freq: Once | INTRAMUSCULAR | Status: AC
  Administered 2012-01-15: 4 mg via INTRAVENOUS
  Filled 2012-01-15: qty 2

## 2012-01-15 MED ORDER — NITROGLYCERIN 0.4 MG SL SUBL: 0.4000 mg | SUBLINGUAL_TABLET | SUBLINGUAL | Status: DC | PRN

## 2012-01-15 MED ORDER — INSULIN ASPART 100 UNIT/ML ~~LOC~~ SOLN: 0.0000 [IU] | Freq: Every day | SUBCUTANEOUS | Status: DC

## 2012-01-15 MED ORDER — HYDRALAZINE HCL 25 MG PO TABS
25.0000 mg | ORAL_TABLET | Freq: Three times a day (TID) | ORAL | Status: DC
  Administered 2012-01-15 – 2012-01-16 (×2): 25 mg via ORAL
  Filled 2012-01-15 (×4): qty 1

## 2012-01-15 MED ORDER — ONDANSETRON HCL 4 MG PO TABS: 4.0000 mg | ORAL_TABLET | Freq: Four times a day (QID) | ORAL | Status: DC | PRN

## 2012-01-15 MED ORDER — ACETAMINOPHEN 650 MG RE SUPP: 650.0000 mg | Freq: Four times a day (QID) | RECTAL | Status: DC | PRN

## 2012-01-15 MED ORDER — ATORVASTATIN CALCIUM 40 MG PO TABS
40.0000 mg | ORAL_TABLET | Freq: Every day | ORAL | Status: DC
  Administered 2012-01-15: 40 mg via ORAL
  Filled 2012-01-15 (×2): qty 1

## 2012-01-15 MED ORDER — PIPERACILLIN-TAZOBACTAM 3.375 G IVPB
3.3750 g | Freq: Once | INTRAVENOUS | Status: AC
  Administered 2012-01-15: 3.375 g via INTRAVENOUS
  Filled 2012-01-15: qty 50

## 2012-01-15 MED ORDER — INSULIN ASPART 100 UNIT/ML ~~LOC~~ SOLN
0.0000 [IU] | Freq: Every day | SUBCUTANEOUS | Status: DC
  Administered 2012-01-15: 2 [IU] via SUBCUTANEOUS

## 2012-01-15 MED ORDER — HEPARIN SODIUM (PORCINE) 5000 UNIT/ML IJ SOLN
5000.0000 [IU] | Freq: Three times a day (TID) | INTRAMUSCULAR | Status: DC
  Administered 2012-01-15 – 2012-01-16 (×2): 5000 [IU] via SUBCUTANEOUS
  Filled 2012-01-15 (×5): qty 1

## 2012-01-15 MED ORDER — FAMOTIDINE 20 MG PO TABS
20.0000 mg | ORAL_TABLET | Freq: Two times a day (BID) | ORAL | Status: DC
  Administered 2012-01-15 – 2012-01-16 (×2): 20 mg via ORAL
  Filled 2012-01-15 (×3): qty 1

## 2012-01-15 MED ORDER — INSULIN ASPART 100 UNIT/ML ~~LOC~~ SOLN
0.0000 [IU] | Freq: Three times a day (TID) | SUBCUTANEOUS | Status: DC
  Administered 2012-01-16: 5 [IU] via SUBCUTANEOUS

## 2012-01-15 MED ORDER — ACETAMINOPHEN 325 MG PO TABS: 650.0000 mg | ORAL_TABLET | Freq: Four times a day (QID) | ORAL | Status: DC | PRN

## 2012-01-15 MED ORDER — ASPIRIN EC 81 MG PO TBEC
81.0000 mg | DELAYED_RELEASE_TABLET | Freq: Every day | ORAL | Status: DC
  Administered 2012-01-16: 81 mg via ORAL
  Filled 2012-01-15 (×2): qty 1

## 2012-01-15 MED ORDER — MORPHINE SULFATE 2 MG/ML IJ SOLN: 2.0000 mg | INTRAMUSCULAR | Status: DC | PRN

## 2012-01-15 MED ORDER — INSULIN ASPART 100 UNIT/ML ~~LOC~~ SOLN: 0.0000 [IU] | Freq: Three times a day (TID) | SUBCUTANEOUS | Status: DC

## 2012-01-15 MED ORDER — ONDANSETRON HCL 4 MG/2ML IJ SOLN: 4.0000 mg | Freq: Four times a day (QID) | INTRAMUSCULAR | Status: DC | PRN

## 2012-01-15 MED ORDER — MORPHINE SULFATE 4 MG/ML IJ SOLN
4.0000 mg | Freq: Once | INTRAMUSCULAR | Status: AC
  Administered 2012-01-15: 4 mg via INTRAVENOUS
  Filled 2012-01-15: qty 1

## 2012-01-15 MED ORDER — ALBUTEROL SULFATE HFA 108 (90 BASE) MCG/ACT IN AERS
1.0000 | INHALATION_SPRAY | Freq: Four times a day (QID) | RESPIRATORY_TRACT | Status: DC | PRN
  Filled 2012-01-15: qty 6.7

## 2012-01-15 MED ORDER — SODIUM CHLORIDE 0.9 % IV BOLUS (SEPSIS)
500.0000 mL | Freq: Once | INTRAVENOUS | Status: AC
  Administered 2012-01-15: 500 mL via INTRAVENOUS

## 2012-01-15 MED ORDER — ALUM & MAG HYDROXIDE-SIMETH 200-200-20 MG/5ML PO SUSP: 30.0000 mL | Freq: Four times a day (QID) | ORAL | Status: DC | PRN

## 2012-01-15 MED ORDER — ISOSORBIDE MONONITRATE ER 30 MG PO TB24
30.0000 mg | ORAL_TABLET | Freq: Every day | ORAL | Status: DC
  Administered 2012-01-16: 30 mg via ORAL
  Filled 2012-01-15 (×2): qty 1

## 2012-01-15 MED ORDER — ACETAMINOPHEN 325 MG PO TABS
650.0000 mg | ORAL_TABLET | Freq: Once | ORAL | Status: AC
  Administered 2012-01-15: 650 mg via ORAL
  Filled 2012-01-15: qty 2

## 2012-01-15 NOTE — ED Notes (Signed)
MD at bedside. Admitting md. 

## 2012-01-15 NOTE — ED Notes (Signed)
Assume care of pt at this time.  Pt currently in radiology dept.

## 2012-01-15 NOTE — ED Notes (Signed)
Pt complains of cp, abd pian, vomiting and cough since last night sts bloating also, language barrier noted, pt speaks [place]

## 2012-01-15 NOTE — ED Notes (Signed)
MD at bedside. EDP [place] 

## 2012-01-15 NOTE — ED Notes (Signed)
Family at bedside. 

## 2012-01-15 NOTE — ED Provider Notes (Signed)
History     CSN: [id number]  Arrival date & time 01/15/12  1259   First MD Initiated Contact with Patient 01/15/12 1312      Chief Complaint  Patient presents with  . Chest Pain  . Abdominal Pain  . Cough    (Consider location/radiation/quality/duration/timing/severity/associated sxs/prior treatment) HPI The patient presents with concerns of increasing in generalized discomfort, dyspnea, nausea, vomiting, cough, chest pain, abdominal pain. The patient's family provides much of the history of present illness, as the patient speaks [place].  They note that since a recent admission for similar concerns, she has not been well.  Over the past day she is increasing symptoms, with no new fever, no diarrhea or loose stool.  He abdominal pain is diffuse, discomfort.  The chest pain is intermittent, and consistent, not exertional or pleuritic. There are no clear alleviating or exacerbating factors. Past Medical History  Diagnosis Date  . Diabetes mellitus, type 2   . GERD (gastroesophageal reflux disease)   . Hepatitis B   . Hyperlipemia   . Hypertension   . Cervical dysplasia   . Atrophic vaginitis   . Spinal stenosis     s/p decompression  . CAD (coronary artery disease) April 2008    s/p CABG;    cath 8/12:  LM patent, pLAD occluded, OM1 30-40%, pOM2 80% and 60% after the anastomosis, pRCA occluded, S-DX occluded (old), S-OM 2 occluded (new), L-LAD patent, dLAD provided collats to the PDA; Med Rx  rec.; consider PCI to OM2 if fails med Rx    Past Surgical History  Procedure Date  . Back surgery     decompression L3-S1  . Coronary artery bypass graft April 2008  . Total abdominal hysterectomy     cervical dysplasia    No family history on file.  History  Substance Use Topics  . Smoking status: Never Smoker   . Smokeless tobacco: Not on file  . Alcohol Use: No    OB History    Grav Para Term Preterm Abortions TAB SAB Ect Mult Living                  Review of  Systems  Constitutional:       HPI  HENT:       HPI otherwise negative  Eyes: Negative.   Respiratory:       HPI, otherwise negative  Cardiovascular:       HPI, otherwise nmegative  Gastrointestinal: Positive for nausea, vomiting and constipation. Negative for diarrhea and blood in stool.  Genitourinary:       HPI, otherwise negative  Musculoskeletal:       HPI, otherwise negative  Skin: Negative.   Neurological: Negative for syncope.    Allergies  Cozaar  Home Medications   Current Outpatient Rx  Name Route Sig Dispense Refill  . ALBUTEROL SULFATE HFA 108 (90 BASE) MCG/ACT IN AERS Inhalation Inhale 1 puff into the lungs every 6 (six) hours as needed for wheezing. 1 Inhaler 6  . AMLODIPINE BESYLATE 5 MG PO TABS Oral Take 5 mg by mouth daily.    . AMOXICILLIN-POT CLAVULANATE 500-125 MG PO TABS Oral Take 1 tablet (500 mg total) by mouth 2 (two) times daily. 14 tablet 0  . ASPIRIN EC 81 MG PO TBEC Oral Take 1 tablet (81 mg total) by mouth daily. 150 tablet 2  . CARVEDILOL 25 MG PO TABS Oral Take 1 tablet (25 mg total) by mouth 2 (two) times daily with a meal.  90 tablet 5  . CLOPIDOGREL BISULFATE 75 MG PO TABS Oral Take 1 tablet (75 mg total) by mouth daily. 90 tablet 3  . FAMOTIDINE 20 MG PO TABS  TAKE 1 TABLET BY MOUTH TWICE A DAY 60 tablet 4  . FUROSEMIDE 20 MG PO TABS Oral Take 1 tablet (20 mg total) by mouth daily. 30 tablet 5  . GLUCOSE BLOOD VI STRP  Use to check blood sugar before meals and bedtime and at 3 AM. Dx code: 250.00 150 each 12  . HYDRALAZINE HCL 25 MG PO TABS Oral Take 1 tablet (25 mg total) by mouth 3 (three) times daily. 270 tablet 5  . INSULIN ASPART 100 UNIT/ML [place] SOLN Subcutaneous Inject 8 Units into the skin 3 (three) times daily before meals. 15 mL 12  . INSULIN GLARGINE 100 UNIT/ML [place] SOLN  Please take 8 Units of Novolog before breakfast and Lunch, and 10 Units of Novolog before dinner. 10 mL 3  . INSULIN PEN NEEDLE 31G X 5 MM MISC Does not apply 1 Units  by Does not apply route 3 (three) times daily with meals. Dx code 250.00 100 each 11  . INSULIN SYRINGE-NEEDLE U-100 31G X 5/16" 1 ML MISC  Use to inject insulin 1 time daily. Diagnosis code 250.00 100 each 11  . ISOSORBIDE MONONITRATE ER 30 MG PO TB24 Oral Take 30 mg by mouth daily.    Letta Pate ULTRASOFT LANCETS MISC  Use to check blood sugar up to 5 time a day. Dx code: 250.00 100 each 12  . LISINOPRIL 5 MG PO TABS Oral Take 1 tablet (5 mg total) by mouth daily. 30 tablet 11  . NITROGLYCERIN 0.4 MG SL SUBL Sublingual Place 0.4 mg under the tongue every 5 (five) minutes as needed. Maximum of 3 doses     . PROMETHAZINE HCL 12.5 MG PO TABS  TAKE 1 TABLET BY MOUTH EVERY 4-6 HOURS AS NEEDED FOR NAUSEA/VOMITING 30 tablet 2  . ROSUVASTATIN CALCIUM 20 MG PO TABS Oral Take 1 tablet (20 mg total) by mouth daily. 90 tablet 8    BP 155/68  Pulse 68  Temp 98.2 F (36.8 C) (Oral)  Resp 22  SpO2 99%  Physical Exam  Nursing note and vitals reviewed. Constitutional: She is oriented to person, place, and time.       Very uncomfortable appearing female, who appears older than her stated age  HENT:  Head: Normocephalic and atraumatic.  Eyes: Conjunctivae normal are normal. Pupils are equal, round, and reactive to light.  Cardiovascular: Normal rate and regular rhythm.   Pulmonary/Chest: Effort normal. No stridor. No respiratory distress.  Abdominal: Soft. Normal appearance. There is no hepatosplenomegaly. There is generalized tenderness. There is no rigidity, no rebound, no guarding and no CVA tenderness.  Musculoskeletal: Normal range of motion.  Neurological: She is alert and oriented to person, place, and time. No cranial nerve deficit. She exhibits normal muscle tone. Coordination normal.  Skin: Skin is warm and dry.  Psychiatric: She has a normal mood and affect.    ED Course  Procedures (including critical care time)   Labs Reviewed  CBC WITH DIFFERENTIAL  COMPREHENSIVE METABOLIC PANEL    LIPASE, BLOOD  TROPONIN I  URINALYSIS, ROUTINE W REFLEX MICROSCOPIC  PROTIME-INR  PRO B NATRIURETIC PEPTIDE   No results found.   No diagnosis found.  3:42 PM Patient resting, sleeping,  VS remain stable (w borderline hypotension).  Given her recent UTI, she will receive broad spectrum abx  for possible early sepsis.  Cardiac 75 sinus rhythm normal Pulse ox 99% room air normal   Date: 01/15/2012  Rate: 74  Rhythm: normal sinus rhythm  QRS Axis: right  Intervals: PR prolonged  ST/T Wave abnormalities: nonspecific T wave changes  Conduction Disutrbances:first-degree A-V block   Narrative Interpretation:   Old EKG Reviewed: none available ABNORMAL   MDM  This elderly female with multiple medical problems, including known renal dysfunction, CAD, hypertension, now presents with generalized complaints, vomiting, chest pain, abdominal pain. According to the patient's family symptoms are typical for the patient, though seemed more pronounced today.  On my exam she is in no distress, is uncomfortable.  Patient's abdomen is soft, with minor diffuse tenderness.  Absent fever, tachycardia, and is low suspicion for acute GI pathology or ACS.  The patient's ECG and labs are reassuring for the latter assessment.  Given the patient's moderate blood pressure, she should intravenous antibiotics, and a catheterized urine specimen was obtained.  She will be admitted for further evaluation and management.  Gerhard Munch, MD 01/15/12 [phone]

## 2012-01-15 NOTE — H&P (Signed)
Hospital Admission Note Date: 01/15/2012  Patient name: Bianca [name] Medical record number: [id number] Date of birth: 24-Nov-1930 Age: 78 y.o. Gender: female PCP: Lorretta Harp, MD  Medical Service:  Attending physician: Dr Doneen Poisson     1st Contact: Dr Dow Adolph     Pager: [phone] 2nd Contact: Dr Dierdre Searles Na    Pager: [phone]  After 5 pm or weekends: 1st Contact:      Pager: [phone] 2nd Contact:      Pager: [phone]  Chief Complaint: Chest pain  History of Present Illness: Bianca [name] is a 78 year old woman with a past medical history of diabetes type2, CKD stage 4, hypertension, heart failure and status post CABG in 2008 who presents to the ED with increasing in generalized discomfort, dyspnea, nausea, vomiting, cough, chest pain, abdominal pain. She describes her pain as sharp and of moderate severity 3/10. The pain does not radiate anywhere. It is mainly on the left and she also complaining of some heart burn as well. Her chest pain is increased by coughing but not by exercise though she gets some chest tightness with exercise. She denies palpitations, dizziness , or shortness of breath. She also describes symptoms of nausea and vomiting this morning. She vomited once this morning and the vomit was mainly saliva since she is not been eating well. She does not have fevers or chills. On 8/20, she was started on a 5 days course of ciprofloxacin for treatment of a urinary tract infection. Follow up urine culture in the clinic with grew multidrug resistant, but sensitive to Augmentin. On the 9/4, she was prescribed given a course of Augmentin for 7 days. She did not have a cough then.   She also describes symptoms of dysuria, and abdominal pain for about 2 days. She describes abdominal pain as crampy in nature without diarrhea but she does have constipation.  The history is limited due to language barrier she speaking [place].   Her medications include Plavix, aspirin, HCTZ, Coreg,  amiodarone, Crestor, furosemide, lisinopril, and isosorbide.   MeAnds: Current Outpatient Rx  Name Route Sig Dispense Refill  . ALBUTEROL SULFATE HFA 108 (90 BASE) MCG/ACT IN AERS Inhalation Inhale 1 puff into the lungs every 6 (six) hours as needed for wheezing. 1 Inhaler 6  . AMLODIPINE BESYLATE 5 MG PO TABS Oral Take 5 mg by mouth daily.    . AMOXICILLIN-POT CLAVULANATE 500-125 MG PO TABS Oral Take 1 tablet (500 mg total) by mouth 2 (two) times daily. 14 tablet 0  . ASPIRIN EC 81 MG PO TBEC Oral Take 1 tablet (81 mg total) by mouth daily. 150 tablet 2  . CARVEDILOL 25 MG PO TABS Oral Take 1 tablet (25 mg total) by mouth 2 (two) times daily with a meal. 90 tablet 5  . CLOPIDOGREL BISULFATE 75 MG PO TABS Oral Take 1 tablet (75 mg total) by mouth daily. 90 tablet 3  . FAMOTIDINE 20 MG PO TABS  TAKE 1 TABLET BY MOUTH TWICE A DAY 60 tablet 4  . FUROSEMIDE 20 MG PO TABS Oral Take 1 tablet (20 mg total) by mouth daily. 30 tablet 5  . GLUCOSE BLOOD VI STRP  Use to check blood sugar before meals and bedtime and at 3 AM. Dx code: 250.00 150 each 12  . HYDRALAZINE HCL 25 MG PO TABS Oral Take 1 tablet (25 mg total) by mouth 3 (three) times daily. 270 tablet 5  . HYDROCHLOROTHIAZIDE 25 MG PO TABS Oral Take 25  mg by mouth daily.    . INSULIN ASPART 100 UNIT/ML [place] SOLN Subcutaneous Inject 8 Units into the skin 3 (three) times daily before meals. 15 mL 12  . INSULIN GLARGINE 100 UNIT/ML [place] SOLN  Please take 8 Units of Novolog before breakfast and Lunch, and 10 Units of Novolog before dinner. 10 mL 3  . INSULIN PEN NEEDLE 31G X 5 MM MISC Does not apply 1 Units by Does not apply route 3 (three) times daily with meals. Dx code 250.00 100 each 11  . INSULIN SYRINGE-NEEDLE U-100 31G X 5/16" 1 ML MISC  Use to inject insulin 1 time daily. Diagnosis code 250.00 100 each 11  . ISOSORBIDE MONONITRATE ER 30 MG PO TB24 Oral Take 30 mg by mouth daily.    Letta Pate ULTRASOFT LANCETS MISC  Use to check blood sugar up to  5 time a day. Dx code: 250.00 100 each 12  . LISINOPRIL 5 MG PO TABS Oral Take 1 tablet (5 mg total) by mouth daily. 30 tablet 11  . NITROGLYCERIN 0.4 MG SL SUBL Sublingual Place 0.4 mg under the tongue every 5 (five) minutes as needed. For chest pain. Maximum of 3 doses    . ROSUVASTATIN CALCIUM 20 MG PO TABS Oral Take 1 tablet (20 mg total) by mouth daily. 90 tablet 8    Allergies: Allergies as of 01/15/2012 - Review Complete 01/15/2012  Allergen Reaction Noted  . Cozaar Other (See Comments) 02/12/2011   Past Medical History  Diagnosis Date  . Diabetes mellitus, type 2   . GERD (gastroesophageal reflux disease)   . Hepatitis B   . Hyperlipemia   . Hypertension   . Cervical dysplasia   . Atrophic vaginitis   . Spinal stenosis     s/p decompression  . CAD (coronary artery disease) April 2008    s/p CABG;    cath 8/12:  LM patent, pLAD occluded, OM1 30-40%, pOM2 80% and 60% after the anastomosis, pRCA occluded, S-DX occluded (old), S-OM 2 occluded (new), L-LAD patent, dLAD provided collats to the PDA; Med Rx  rec.; consider PCI to OM2 if fails med Rx   Past Surgical History  Procedure Date  . Back surgery     decompression L3-S1  . Coronary artery bypass graft April 2008  . Total abdominal hysterectomy     cervical dysplasia   No family history on file. History   Social History  . Marital Status: Widowed    Spouse Name: N/A    Number of Children: N/A  . Years of Education: N/A   Occupational History  . Not on file.   Social History Main Topics  . Smoking status: Never Smoker   . Smokeless tobacco: Not on file  . Alcohol Use: No  . Drug Use: No  . Sexually Active: Not on file   Other Topics Concern  . Not on file   Social History Narrative   Chinese immigrantHousewifeWidowedLives w/ her son    Review of Systems: Constitutional: positive for anorexia, fatigue and malaise Ears, nose, mouth, throat, and face: negative for ear drainage, earaches, nasal  congestion, sore mouth and sore throat Cardiovascular: negative for claudication, irregular heart beat and syncope Musculoskeletal:positive for myalgias Neurological: positive for weakness  Physical Exam: Blood pressure 107/56, pulse 76, temperature 98.2 F (36.8 C), temperature source Oral, resp. rate 13, SpO2 97.00%. General appearance: alert, cooperative, fatigued and no distress Head: Normocephalic, without obvious abnormality, atraumatic Eyes: negative, conjunctivae/corneas clear. PERRL, EOM's intact. Fundi  benign. Ears: normal TM's and external ear canals both ears Nose: Nares normal. Septum midline. Mucosa normal. No drainage or sinus tenderness. Throat: lips, mucosa, and tongue normal; teeth and gums normal Neck: no adenopathy, no carotid bruit, no JVD, supple, symmetrical, trachea midline and thyroid not enlarged, symmetric, no tenderness/mass/nodules Back: negative, symmetric, no curvature. ROM normal. No CVA tenderness., No costovertebral angle tenderness Lungs: clear to auscultation bilaterally Heart: regular rate and rhythm, S1, S2 normal, no murmur, click, rub or gallop Abdomen: soft, non-tender; bowel sounds normal; no masses,  no organomegaly Extremities: extremities normal, atraumatic, no cyanosis or edema Pulses: 2+ and symmetric Skin: Skin color, texture, turgor normal. No rashes or lesions Neurologic: Alert and oriented X 3, normal strength and tone. Normal symmetric reflexes. Normal coordination and gait  Lab results: Basic Metabolic Panel:  Basename 01/15/12 1306  NA 136  K 3.4*  CL 99  CO2 23  GLUCOSE 239*  BUN 46*  CREATININE 2.22*  CALCIUM 10.1  MG --  PHOS --   Liver Function Tests:  Basename 01/15/12 1306  AST 17  ALT 13  ALKPHOS 72  BILITOT 0.7  PROT 8.0  ALBUMIN 4.1    Basename 01/15/12 1306  LIPASE 75*  AMYLASE --   No results found for this basename: AMMONIA:2 in the last 72 hours CBC:  Basename 01/15/12 1306  WBC 14.5*    NEUTROABS 12.8*  HGB 10.5*  HCT 31.4*  MCV 86.0  PLT 131*   Cardiac Enzymes:  Basename 01/15/12 1402  CKTOTAL --  CKMB --  CKMBINDEX --  TROPONINI <0.30   BNP:  Basename 01/15/12 1402  PROBNP 362.0   D-Dimer: No results found for this basename: DDIMER:2 in the last 72 hours CBG: No results found for this basename: GLUCAP:6 in the last 72 hours Hemoglobin A1C: No results found for this basename: HGBA1C in the last 72 hours Fasting Lipid Panel: No results found for this basename: CHOL,HDL,LDLCALC,TRIG,CHOLHDL,LDLDIRECT in the last 72 hours Thyroid Function Tests: No results found for this basename: TSH,T4TOTAL,FREET4,T3FREE,THYROIDAB in the last 72 hours Anemia Panel: No results found for this basename: VITAMINB12,FOLATE,FERRITIN,TIBC,IRON,RETICCTPCT in the last 72 hours Coagulation:  Basename 01/15/12 1402  LABPROT 14.0  INR 1.06   Urine Drug Screen: Drugs of Abuse  No results found for this basename: labopia, cocainscrnur, labbenz, amphetmu, thcu, labbarb    Alcohol Level: No results found for this basename: ETH:2 in the last 72 hours Urinalysis:  Basename 01/15/12 1546  COLORURINE YELLOW  LABSPEC 1.014  PHURINE 7.0  GLUCOSEU 250*  HGBUR NEGATIVE  BILIRUBINUR NEGATIVE  KETONESUR NEGATIVE  PROTEINUR 30*  UROBILINOGEN 0.2  NITRITE NEGATIVE  LEUKOCYTESUR NEGATIVE   Misc. Labs:   Imaging results:  Dg Chest 2 View  01/15/2012  *RADIOLOGY REPORT*  Clinical Data: Chest pain.  Abdominal pain.  Cough.  Diabetes. Hepatitis.  Coronary artery disease.  CHEST - 2 VIEW  Comparison: 12/14/2010  Findings: Prior CABG noted.  Atherosclerotic calcification of the aortic arch is present.  Borderline cardiomegaly noted without edema.  No pleural effusion. The lungs appear clear. Calcified mediastinal lymph nodes noted.  IMPRESSION:  1.  Borderline cardiomegaly, without edema. 2.  Prior CABG.   Original Report Authenticated By: Dellia Cloud, M.D.     Other  results: EKG: NSR, No intervals, normal rate, RAD   Assessment & Plan by Problem: This is a 78 year old woman, with a past medical history of CHF, hypertension, hyperlipidemia, and status post CABG, who presents with chest pain, associated with  cough for 3 days.   Chest pain:  Her description of chest pain is very atypical and poorly described. It is sharp, and she's not sure, whether it's accompanied by heartburn. However its associated with cough, which has not resolved with antibiotics. She denies any dizziness or palpitations associated with this chest pain. There is no shortness of breath. Possibly curative for this chest pain is most likely bronchitis as opposed to cardiac origin of a PE. White blood cell count is elevated at 14.5 with increased neutrophils. Chest x-ray does not demonstrate any acute cardiopulmonary process. I still believe this is bronchitis versus gastro-esophageal reflux disease. However, a half to exclude cardiac ischemia. Initial troponin is negative. Plan:  -We will monitor with repeat EKG -Troponin -Zosyn -IV fluids   UTI: She called in for prescription of antibiotics due to a suspected urinary tract infection. However, she still complains of some dysuria. Urine examination performed 10 days ago was abnormal with urine being cloudy with positive nitrites and leukocytes. However, a repeat today in the ED is largely normal. I suspect that she might still have some residual UTI, and she would benefit from some antibiotic treatment. -Will do a urine culture. -Acetaminophen for abdominal pain  Renal failure, chronic: Patient's creatinine is elevated at a baseline of about 1.5-1.8. She has a slightly elevated creatinine from baseline up to 2.22.  -Will withhold diuretics - IVF  - Repeat BMET in morning    HYPERTENSION: Will withhold blood pressure medication given low BPs in the ED. Monitor blood pressure and consider restarting if elevated.   HYPERLIPIDEMIA: Will  continue with   CAD: The patient is status post CABG in 2008. This is probably the cause of his CHF. Will continue with simvastatin. ECHO in June 2013Systolic function was normal. The estimated ejection fraction was in the range of 55% to 60%. Wall motion was normal; there were no regional wall motion abnormalities. Features are consistent with a pseudonormal left ventricular filling pattern, with concomitant abnormal relaxation and increased filling pressure (grade 2 diastolic Dysfunction). We will restart Lisinopril, isosorbide, furosemide once BP improves.   DIABETES MELLITUS, TYPE II: Hb1AC n December 21 2010, was elevated at 12.5%. CBG are high at 239.  Will start Insulin Lantus at bedtime with SSI   Signed: Dow Adolph 01/15/2012, 7:34 PM

## 2012-01-15 NOTE — ED Notes (Signed)
VO V & RB per EDP [place], do not give Fosfomycin

## 2012-01-15 NOTE — ED Notes (Signed)
Pt remains in radiology at that time.

## 2012-01-16 ENCOUNTER — Encounter (HOSPITAL_COMMUNITY): Payer: Self-pay | Admitting: *Deleted

## 2012-01-16 DIAGNOSIS — K219 Gastro-esophageal reflux disease without esophagitis: Secondary | ICD-10-CM

## 2012-01-16 DIAGNOSIS — I10 Essential (primary) hypertension: Secondary | ICD-10-CM

## 2012-01-16 DIAGNOSIS — R05 Cough: Principal | ICD-10-CM

## 2012-01-16 DIAGNOSIS — E119 Type 2 diabetes mellitus without complications: Secondary | ICD-10-CM

## 2012-01-16 DIAGNOSIS — N289 Disorder of kidney and ureter, unspecified: Secondary | ICD-10-CM

## 2012-01-16 LAB — CBC
HCT: 28.4 % — ABNORMAL LOW (ref 36.0–46.0)
MCV: 87.7 fL (ref 78.0–100.0)
Platelets: 116 10*3/uL — ABNORMAL LOW (ref 150–400)
RBC: 3.24 MIL/uL — ABNORMAL LOW (ref 3.87–5.11)
WBC: 10.2 10*3/uL (ref 4.0–10.5)

## 2012-01-16 LAB — GLUCOSE, CAPILLARY: Glucose-Capillary: 228 mg/dL — ABNORMAL HIGH (ref 70–99)

## 2012-01-16 LAB — URINE CULTURE

## 2012-01-16 LAB — UREA NITROGEN, URINE: Urea Nitrogen, Ur: 512 mg/dL

## 2012-01-16 LAB — BASIC METABOLIC PANEL
CO2: 24 mEq/L (ref 19–32)
Chloride: 105 mEq/L (ref 96–112)
GFR calc Af Amer: 30 mL/min — ABNORMAL LOW (ref >90)
Potassium: 3.6 mEq/L (ref 3.5–5.1)
Sodium: 139 mEq/L (ref 135–145)

## 2012-01-16 LAB — TROPONIN I: Troponin I: <0.3 ng/mL (ref <0.30)

## 2012-01-16 MED ORDER — POTASSIUM CHLORIDE CRYS ER 20 MEQ PO TBCR: 40.0000 meq | EXTENDED_RELEASE_TABLET | Freq: Two times a day (BID) | ORAL | Status: DC

## 2012-01-16 MED ORDER — PANTOPRAZOLE SODIUM 20 MG PO TBEC: 40.0000 mg | DELAYED_RELEASE_TABLET | Freq: Every day | ORAL | Status: DC

## 2012-01-16 MED ORDER — PANTOPRAZOLE SODIUM 20 MG PO TBEC: 20.0000 mg | DELAYED_RELEASE_TABLET | Freq: Every day | ORAL | Status: DC

## 2012-01-16 NOTE — Evaluation (Signed)
Physical Therapy Evaluation Patient Details Name: Bianca [name] MRN: [id number] DOB: [birth year] Today's Date: 01/16/2012 Time: 1191-4782 PT Time Calculation (min): 11 min  PT Assessment / Plan / Recommendation Clinical Impression  Patient is a 78 yo female admitted with chest pain.  Patient is independent with all mobility and gait.  Good balance with gait.  No acute PT needs - PT will sign off.    PT Assessment  Patent does not need any further PT services    Follow Up Recommendations  No PT follow up;Supervision - Intermittent    Barriers to Discharge        Equipment Recommendations  None recommended by PT    Recommendations for Other Services     Frequency      Precautions / Restrictions Precautions Precautions: None Restrictions Weight Bearing Restrictions: No         Mobility  Bed Mobility Bed Mobility: Supine to Sit Supine to Sit: 7: Independent;HOB flat Details for Bed Mobility Assistance: No cues or assist needed Transfers Transfers: Sit to Stand;Stand to Sit Sit to Stand: 7: Independent;From bed;From toilet Stand to Sit: 7: Independent;To toilet;To bed Details for Transfer Assistance: No cues or assistance required. Ambulation/Gait Ambulation/Gait Assistance: 7: Independent Ambulation Distance (Feet): 200 Feet Assistive device: None Ambulation/Gait Assistance Details: No assistance needed.  Patient with good balance.  Patient reports she just feels "weak". Gait Pattern: Within Functional Limits Gait velocity: Slow gait speed    PT Goals  N/A  Visit Information  Last PT Received On: 01/16/12 Assistance Needed: +1    Subjective Data  Subjective: Daughter translating.  Patient wanting to go home. Patient Stated Goal: To go home   Prior Functioning  Home Living Lives With: Alone Available Help at Discharge: Family;Available PRN/intermittently Type of Home: House Home Access: Stairs to enter [employer] of Steps: 2 Entrance  Stairs-Rails: Right;Left Home Layout: One level Home Adaptive Equipment: None Prior Function Level of Independence: Independent Able to Take Stairs?: Yes Driving: No Vocation:  (Housewife.  ) Comments: Daughter reports patient is active.  Cooks for entire family. Communication Communication: Prefers language other than English;Interpreter utilized (Daughter interpreting)    Cognition  Overall Cognitive Status: Difficult to assess Difficult to assess due to: Non-English speaking Arousal/Alertness: Awake/alert Orientation Level: Appears intact for tasks assessed Behavior During Session: [facility] for tasks performed    Extremity/Trunk Assessment Right Upper Extremity Assessment RUE ROM/Strength/Tone: Within functional levels Left Upper Extremity Assessment LUE ROM/Strength/Tone: Within functional levels Right Lower Extremity Assessment RLE ROM/Strength/Tone: Within functional levels Left Lower Extremity Assessment LLE ROM/Strength/Tone: Within functional levels Trunk Assessment Trunk Assessment: Normal   Balance Balance Balance Assessed: Yes High Level Balance High Level Balance Activites: Turns;Sudden stops;Head turns High Level Balance Comments: No loss of balance with high level balance activities  End of Session PT - End of Session Activity Tolerance: Patient tolerated treatment well Patient left: in bed;with call bell/phone within reach;with family/visitor present (sitting on EOB) Nurse Communication: Mobility status  GP     Vena Austria 01/16/2012, 10:52 AM Durenda Hurt. Renaldo Fiddler, [facility] Acute Rehab Services Pager [phone]

## 2012-01-16 NOTE — Discharge Summary (Signed)
Internal Medicine Teaching [facility] Discharge Note  Name: Bianca [name] MRN: [id number] DOB: July 01, 1930 78 y.o.  Date of Admission: 01/15/2012  1:10 PM Date of Discharge: 01/16/2012 Attending Physician: No att. providers found  Discharge Diagnosis: Principal Problem:  *Chest pain Active Problems:  HYPERLIPIDEMIA  HYPERTENSION  CAD  Renal failure, chronic  UTI  COUGH, CHRONIC  DIABETES MELLITUS, TYPE II   Discharge Medications:   Medication List     As of 01/16/2012  4:00 PM    STOP taking these medications         amoxicillin-clavulanate 500-125 MG per tablet   Commonly known as: AUGMENTIN      famotidine 20 MG tablet   Commonly known as: PEPCID      furosemide 20 MG tablet   Commonly known as: LASIX      hydrochlorothiazide 25 MG tablet   Commonly known as: HYDRODIURIL      lisinopril 5 MG tablet   Commonly known as: PRINIVIL,ZESTRIL      onetouch ultrasoft lancets      TAKE these medications         albuterol 108 (90 BASE) MCG/ACT inhaler   Commonly known as: PROVENTIL HFA;VENTOLIN HFA   Inhale 1 puff into the lungs every 6 (six) hours as needed for wheezing.      amLODipine 5 MG tablet   Commonly known as: NORVASC   Take 5 mg by mouth daily.      aspirin EC 81 MG tablet   Take 1 tablet (81 mg total) by mouth daily.      carvedilol 25 MG tablet   Commonly known as: COREG   Take 1 tablet (25 mg total) by mouth 2 (two) times daily with a meal.      clopidogrel 75 MG tablet   Commonly known as: PLAVIX   Take 1 tablet (75 mg total) by mouth daily.      glucose blood test strip   Use to check blood sugar before meals and bedtime and at 3 AM.  Dx code: 250.00      hydrALAZINE 25 MG tablet   Commonly known as: APRESOLINE   Take 1 tablet (25 mg total) by mouth 3 (three) times daily.      insulin aspart 100 UNIT/ML injection   Commonly known as: novoLOG   Inject 8 Units into the skin 3 (three) times daily before meals.      insulin  glargine 100 UNIT/ML injection   Commonly known as: LANTUS   Please take 8 Units of Novolog before breakfast and Lunch, and 10 Units of Novolog before dinner.      Insulin Pen Needle 31G X 5 MM Misc   1 Units by Does not apply route 3 (three) times daily with meals. Dx code 250.00      Insulin Syringe-Needle U-100 31G X 5/16" 1 ML Misc   Use to inject insulin 1 time daily. Diagnosis code 250.00      isosorbide mononitrate 30 MG 24 hr tablet   Commonly known as: IMDUR   Take 30 mg by mouth daily.      nitroGLYCERIN 0.4 MG SL tablet   Commonly known as: NITROSTAT   Place 0.4 mg under the tongue every 5 (five) minutes as needed. For chest pain. Maximum of 3 doses      pantoprazole 20 MG tablet   Commonly known as: PROTONIX   Take 2 tablets (40 mg total) by mouth daily.  rosuvastatin 20 MG tablet   Commonly known as: CRESTOR   Take 1 tablet (20 mg total) by mouth daily.         Disposition and follow-up:   Bianca [name] was discharged from [facility] in stable condition.    At the hospital follow up visit please address.  1. Please do a basic metabolic panel for creatinine and potassium. Furosemide, lisinopril, hydrochlorothiazide and Augmentin, were discontinued during this hospitalization since they were felt to be contributing to acute renal failure. You may consider restarting furosemide and all hydrochlorothiazide if her creatinine is stable.  2. Please consider restarting her on an angiotensin receptor blocker if her creatinine permits. Lisinopril has been discontinued since there is a question of chronic cough, resulting from it, in addition to worsening renal function. Upon starting an angiotensin receptor blocker, please monitor creatinine closely. If she tolerates ARB, please consider discontinuing amlodipine given that she is on multiple medications for high blood pressure.  3. Please evaluate patient for chest pain. She has been started on a PPI  since it was felt that the chest pain could be resulting from GERD. Famotidine has been discontinued and started on Protonix.  4. Please refer the patient to [facility] for further counseling on diabetes management.  5. Please assist. Patient set up an appointment with a nephrologist to review her chronic kidney disease.  Follow-up Appointments: Follow-up Information    Call Lorretta Harp, MD. (The clinic will call you)    Contact information:   [address] [place] [address] [phone]         Discharge Orders    Future Orders Please Complete By Expires   Diet - low sodium heart healthy      Increase activity slowly      Call MD for:  temperature >100.4      Call MD for:  persistant nausea and vomiting      Call MD for:  severe uncontrolled pain      Call MD for:  difficulty breathing, headache or visual disturbances      Call MD for:  persistant dizziness or light-headedness      (HEART FAILURE PATIENTS) Call MD:  Anytime you have any of the following symptoms: 1) 3 pound weight gain in 24 hours or 5 pounds in 1 week 2) shortness of breath, with or without a dry hacking cough 3) swelling in the hands, feet or stomach 4) if you have to sleep on extra pillows at night in order to breathe.         Consultations:    Procedures Performed:  Dg Chest 2 View  01/15/2012  *RADIOLOGY REPORT*  Clinical Data: Chest pain.  Abdominal pain.  Cough.  Diabetes. Hepatitis.  Coronary artery disease.  CHEST - 2 VIEW  Comparison: 12/14/2010  Findings: Prior CABG noted.  Atherosclerotic calcification of the aortic arch is present.  Borderline cardiomegaly noted without edema.  No pleural effusion. The lungs appear clear. Calcified mediastinal lymph nodes noted.  IMPRESSION:  1.  Borderline cardiomegaly, without edema. 2.  Prior CABG.   Original Report Authenticated By: Dellia Cloud, M.D.      Admission HPI:  Bianca [name] is a 78 year old woman with a history of coronary artery  disease status post CABG, diastolic heart failure, hypertension, hyperlipidemia, poorly controlled diabetes, and stage IV chronic kidney disease who presents with an acute cough , associated with feeling unwell with nausea, but without vomiting, associated with  increasing in generalized discomfort, dyspnea, nausea, vomiting, cough, chest pain, abdominal pain. She described her pain as sharp and of moderate severity 3/10. The pain does not radiate anywhere. It is mainly on the left and she also complaining of some heart burn as well. Her chest pain is increased by coughing but not by exercise though she gets some chest tightness with exercise. She denies palpitations, dizziness , or shortness of breath. She also describes symptoms of nausea and vomiting this morning. Review of the history and examination do not given the obvious cause for this cough. She denied any history of fever or chills. She has reflux disease on an H2 blocker. She was on lisinopril, furosemide, hydrochlorothiazide, hydralazine, and isosorbide nitrate, amlodipine, and chloride. On 8/20, she was started on a 5 days course of ciprofloxacin for treatment of a urinary tract infection. Follow up urine culture in the clinic with grew multidrug resistant, but sensitive to Augmentin. On the 9/4, she was prescribed given a course of Augmentin for 7 days.   Hospital Course by problem list:  Acute COUGH and Chest pain: : Ms Fei presented with complaints of acute cough, but she also reported some chronic cough, and the exact duration was unclear. From the history and physical exam we did not find any clues for cardiac or infectious cause of this cough. Chest x-ray was unremarkable. We have held her lisinopril because of the acute renal insufficiency. If the cough remains well controlled on the therapy as outlined below she can restart the lisinopril or an angiotensin receptor blocker if her renal function returns to baseline as an outpatient. We are  concentrating our therapy for her acute cough by changing her H2 blocker to a PPI. It is hoped that asymptomatic reflux disease may have caused her acute cough. Further followup as outpatient has been advised. Acute on chronic renal failure: Ms. Wilds is a known patient of diabetic nephropathy with stage IV chronic renal disease. Her baseline creatinines between 1.6-1.8. On admission to had acutely increased to 2.22. Contributing factors considered included hypertension do to reduced oral intake, and medications, including lisinopril, Lasix, hydrochlorothiazide, and recent use of Augmentin. We have held the Lasix and the lisinopril. The history we obtained from the daughter suggests that Lasix may not be necessary. We will therefore hold indefinitely. We will continue to hold lisinopril until she seen in clinic. If her renal function has returned to baseline with a creatinine of about 1.6 we will restart the lisinopril or restarting her on an angiotensin receptor blocker and reassess her afterload. She has been advised to return to outpatient clinic for repeat basic metabolic panel and followup on the blood pressure and heart failure. Upon evaluation medications will be adjusted as needed. Heart disease: We will continue the aspirin, Coreg, and Crestor. We will also continue the isosorbide if it has helped with any symptoms of chest pain. The Lasix will be discontinued and lisinopril be held until she is seen in clinic. Review of her previous echocardiograms revealed a good ejection fraction in the ranges of 55-60%. Unless she develops overt edema or other features of fluid overload, she will do well without the Lasix. Other medications including hydrochlorothiazide can be restarted based on the blood pressure. Gastroesophageal reflux disease: Review of the history and examination do not given the obvious cause for this cough. She denied any history of fever or chills. She has reflux disease on an H2 blocker.This  may be the cause of her acute cough. It can  be asymptomatic in over 75 % of cases. She will be switched from her H2 blocker to a PPI in hopes of getting better control of her reflux if this is the cause of her acute cough. Diabetes: We'll continue her outpatient regimen. She may require further adjustments to get better control of her diabetes as an outpatient.   Discharge Vitals:  BP 122/60  Pulse 84  Temp 98.9 F (37.2 C) (Oral)  Resp 15  Ht 5\' 2"  (1.575 m)  Wt 114 lb 10.2 oz (52 kg)  BMI 20.97 kg/m2  SpO2 97%  Discharge Labs:  Results for orders placed during the hospital encounter of 01/15/12 (from the past 24 hour(s))  MAGNESIUM     Status: Normal   Collection Time   01/15/12  7:45 PM      Component Value Range   Magnesium 2.2  1.5 - 2.5 mg/dL  BASIC METABOLIC PANEL     Status: Abnormal   Collection Time   01/15/12  7:46 PM      Component Value Range   Sodium 137  135 - 145 mEq/L   Potassium 3.6  3.5 - 5.1 mEq/L   Chloride 103  96 - 112 mEq/L   CO2 23  19 - 32 mEq/L   Glucose, Bld 211 (*) 70 - 99 mg/dL   BUN 39 (*) 6 - 23 mg/dL   Creatinine, Ser 1.61 (*) 0.50 - 1.10 mg/dL   Calcium 9.2  8.4 - 09.6 mg/dL   GFR calc non Af Amer 24 (*) >90 mL/min   GFR calc Af Amer 27 (*) >90 mL/min  GLUCOSE, CAPILLARY     Status: Abnormal   Collection Time   01/15/12  9:31 PM      Component Value Range   Glucose-Capillary 216 (*) 70 - 99 mg/dL  BASIC METABOLIC PANEL     Status: Abnormal   Collection Time   01/16/12  6:00 AM      Component Value Range   Sodium 139  135 - 145 mEq/L   Potassium 3.6  3.5 - 5.1 mEq/L   Chloride 105  96 - 112 mEq/L   CO2 24  19 - 32 mEq/L   Glucose, Bld 147 (*) 70 - 99 mg/dL   BUN 32 (*) 6 - 23 mg/dL   Creatinine, Ser 0.45 (*) 0.50 - 1.10 mg/dL   Calcium 9.3  8.4 - 40.9 mg/dL   GFR calc non Af Amer 26 (*) >90 mL/min   GFR calc Af Amer 30 (*) >90 mL/min  CBC     Status: Abnormal   Collection Time   01/16/12  6:00 AM      Component Value Range   WBC  10.2  4.0 - 10.5 K/uL   RBC 3.24 (*) 3.87 - 5.11 MIL/uL   Hemoglobin 9.2 (*) 12.0 - 15.0 g/dL   HCT 81.1 (*) 91.4 - 78.2 %   MCV 87.7  78.0 - 100.0 fL   MCH 28.4  26.0 - 34.0 pg   MCHC 32.4  30.0 - 36.0 g/dL   RDW 95.6  21.3 - 08.6 %   Platelets 116 (*) 150 - 400 K/uL  GLUCOSE, CAPILLARY     Status: Abnormal   Collection Time   01/16/12  8:02 AM      Component Value Range   Glucose-Capillary 228 (*) 70 - 99 mg/dL   Comment 1 Notify RN    TROPONIN I     Status: Normal   Collection Time  01/16/12  9:04 AM      Component Value Range   Troponin I <0.30  <0.30 ng/mL  GLUCOSE, CAPILLARY     Status: Abnormal   Collection Time   01/16/12 11:41 AM      Component Value Range   Glucose-Capillary 137 (*) 70 - 99 mg/dL   Comment 1 Notify RN      Signed: Dow Adolph 01/16/2012, 4:00 PM   Time Spent on Discharge: 30 minutes

## 2012-01-16 NOTE — Progress Notes (Signed)
Subjective:    Interval Events:   She feels bette this morning and request to go home. She was walked in the hallway and she did great.  She says that her chest pain is better with less SOB.     Objective:    Vital Signs:   Temp:  [98.9 F (37.2 C)] 98.9 F (37.2 C) (09/15 0600) Pulse Rate:  [68-84] 84  (09/15 1012) Resp:  [9-20] 15  (09/15 0600) BP: (93-133)/(42-61) 122/60 mmHg (09/15 0605) SpO2:  [97 %-100 %] 97 % (09/15 0600) Weight:  [114 lb 10.2 oz (52 kg)] 114 lb 10.2 oz (52 kg) (09/14 2105)     Weights: 24-hour Weight change:   Filed Weights   01/15/12 2105  Weight: 114 lb 10.2 oz (52 kg)     Intake/Output:   Intake/Output Summary (Last 24 hours) at 01/16/12 1317 Last data filed at 01/16/12 1200  Gross per 24 hour  Intake   1550 ml  Output    850 ml  Net    700 ml       Physical Exam: General appearance: alert, cooperative and no distress Resp: clear to auscultation bilaterally Cardio: regular rate and rhythm, S1, S2 normal, no murmur, click, rub or gallop GI: soft, non-tender; bowel sounds normal; no masses,  no organomegaly Extremities: extremities normal, atraumatic, no cyanosis or edema    Labs: Basic Metabolic Panel:  Lab 01/16/12 1610 01/15/12 1946 01/15/12 1945 01/15/12 1306  NA 139 137 -- 136  K 3.6 3.6 -- 3.4*  CL 105 103 -- 99  CO2 24 23 -- 23  GLUCOSE 147* 211* -- 239*  BUN 32* 39* -- 46*  CREATININE 1.79* 1.94* -- 2.22*  CALCIUM 9.3 9.2 -- 10.1  MG -- -- 2.2 --  PHOS -- -- -- --    Liver Function Tests:  Lab 01/15/12 1306  AST 17  ALT 13  ALKPHOS 72  BILITOT 0.7  PROT 8.0  ALBUMIN 4.1    Lab 01/15/12 1306  LIPASE 75*  AMYLASE --   No results found for this basename: AMMONIA:3 in the last 168 hours  CBC:  Lab 01/16/12 0600 01/15/12 1306  WBC 10.2 14.5*  NEUTROABS -- 12.8*  HGB 9.2* 10.5*  HCT 28.4* 31.4*  MCV 87.7 86.0  PLT 116* 131*    Cardiac Enzymes:  Lab 01/16/12 0904 01/15/12 1402  CKTOTAL --  --  CKMB -- --  CKMBINDEX -- --  TROPONINI <0.30 <0.30    BNP: No components found with this basename: POCBNP:5  CBG:  Lab 01/16/12 1141 01/16/12 0802 01/15/12 2131  GLUCAP 137* 228* 216*    Coagulation Studies:  Basename 01/15/12 1402  LABPROT 14.0  INR 1.06    Microbiology: Results for orders placed in visit on 01/05/12  URINE CULTURE     Status: Normal   Collection Time   01/05/12 10:50 AM      Component Value Range Status Comment   Culture ESCHERICHIA COLI   Final    Colony Count >=100,000 COLONIES/ML   Final    Organism ID, Bacteria ESCHERICHIA COLI   Final     Other results: EKG Results: Rate:  76 PR:  Normal  QRS:  Normal intervals QTc:  Normal  EKG: normal EKG, normal sinus rhythm, unchanged from previous tracings No signs of right heart strain on the most recent EKG.  Imaging: Dg Chest 2 View  01/15/2012  *RADIOLOGY REPORT*  Clinical Data: Chest pain.  Abdominal pain.  Cough.  Diabetes. Hepatitis.  Coronary artery disease.  CHEST - 2 VIEW  Comparison: 12/14/2010  Findings: Prior CABG noted.  Atherosclerotic calcification of the aortic arch is present.  Borderline cardiomegaly noted without edema.  No pleural effusion. The lungs appear clear. Calcified mediastinal lymph nodes noted.  IMPRESSION:  1.  Borderline cardiomegaly, without edema. 2.  Prior CABG.   Original Report Authenticated By: Dellia Cloud, M.D.       Medications:    Infusions:     Scheduled Medications:    . acetaminophen  650 mg Oral Once  . aspirin EC  81 mg Oral Daily  . atorvastatin  40 mg Oral q1800  . carvedilol  25 mg Oral BID WC  . clopidogrel  75 mg Oral Daily  . famotidine  20 mg Oral BID  . heparin  5,000 Units Subcutaneous Q8H  . hydrALAZINE  25 mg Oral TID  . insulin aspart  0-5 Units Subcutaneous QHS  . insulin aspart  0-5 Units Subcutaneous QHS  . insulin aspart  0-9 Units Subcutaneous TID WC  . insulin glargine  5 Units Subcutaneous QHS  . isosorbide  mononitrate  30 mg Oral Daily  .  morphine injection  4 mg Intravenous Once  . ondansetron (ZOFRAN) IV  4 mg Intravenous Once  . piperacillin-tazobactam (ZOSYN)  IV  3.375 g Intravenous Once  . sodium chloride  500 mL Intravenous Once  . DISCONTD: amLODipine  5 mg Oral Daily  . DISCONTD: fosfomycin  3 g Oral Once  . DISCONTD: insulin aspart  0-15 Units Subcutaneous TID WC  . DISCONTD: potassium chloride  40 mEq Oral BID     PRN Medications: acetaminophen, acetaminophen, albuterol, alum & mag hydroxide-simeth, morphine injection, nitroGLYCERIN, ondansetron (ZOFRAN) IV, ondansetron   Assessment/ Plan:   This is a 78 year old woman, with a past medical history of CHF, hypertension, hyperlipidemia, and status post CABG, who presents with chest pain, associated with cough for 3 days.   Acute cough: We have held her lisinopril because of the acute renal insufficiency. If the cough remains well controlled on the therapy as outlined below she can restart the lisinopril if her renal function returns to baseline as an outpatient. We are concentrating our therapy for her acute cough by changing her H2 blocker to a PPI. It is hoped that asymptomatic reflux disease may have caused her acute cough.  Acute on chronic renal failure: We have held the Lasix and the lisinopril. The history we obtained from the daughter suggests that Lasix may not be necessary. We will therefore hold indefinitely. We will continue to hold lisinopril until she seen in clinic. If her renal function has returned to baseline with a creatinine of about 1.6 we will restart the lisinopril and reassess her afterload. We will also reassess if she develops a recurrent cough while on the lisinopril.  Heart disease: We will continue the aspirin, Coreg, and Crestor. We will also continue the isosorbide if it has helped with any symptoms of chest pain. The Lasix will be discontinued and lisinopril be held until she is seen in clinic.  Diabetes:  We'll continue her outpatient regimen. She may require further adjustments to get better control of her diabetes as an outpatient.  Gastroesophageal reflux disease: This may be the cause of her acute cough. It can be asymptomatic in over 75 % of cases. She will be switched from her H2 blocker to a PPI in hopes of getting better control of her reflux if this is  the cause of her acute cough.    Length of Stay: 1 days   Signed by:  Dow Adolph PGY-I, Internal Medicine Pager [phone] 01/16/2012, 1:17 PM

## 2012-01-16 NOTE — H&P (Signed)
Internal Medicine Attending Admission Note Date: 01/16/2012  Patient name: Bianca [name] Medical record number: [id number] Date of birth: Jul 24, 1930 Age: 78 y.o. Gender: female  I saw and evaluated the patient. I reviewed the resident's note and I agree with the resident's findings and plan as documented in the resident's note.  Chief Complaint(s): Cough x 1 day.  History - key components related to admission:  Bianca [name] is a 78 year old woman with a history of coronary artery disease status post CABG in 2008, diastolic dysfunction, hypertension, poorly controlled diabetes, stage IV chronic kidney disease, and spinal stenosis who presents with a one-day history of a nonproductive cough associated with chest pain and generalized malaise. She states she does not normally have a cough although admits to an occasional cough throughout the week. She does not smoke but is on lisinopril for her heart disease. She has a history of gastroesophageal reflux disease and has been on an H2 blocker. She states she does not feel any heartburn or brackish taste in the back of her mouth. She denies any wheezing or a sensation of a postnasal drip or scratchy throat. Her cough has been completely nonproductive and she has no associated shortness of breath. She feels very well on the morning after admission with a marked decrease in her cough. She was walked around the hall by physical therapy who said she did wonderfully and does not need their services. She would like to go home today as she is feeling better.  Physical Exam - key components related to admission:  Filed Vitals:   01/16/12 0600 01/16/12 0601 01/16/12 0602 01/16/12 0605  BP: 120/55 111/51 118/48 122/60  Pulse: 70 76 77 68  Temp: 98.9 F (37.2 C)     TempSrc:      Resp: 15     Height:      Weight:      SpO2: 97%      General: Well-developed well-nourished woman sitting comfortably on the edge of the bed in no acute distress Lungs: Clear to  auscultation bilaterally without wheezes, rhonchi, or rales. Heart: Regular rate and rhythm without murmurs, rubs, or gallops. Abdomen: Soft, nontender, active bowel sounds. Extremities: Without edema.  Lab results:  Basic Metabolic Panel:  Basename 01/16/12 0600 01/15/12 1946 01/15/12 1945  NA 139 137 --  K 3.6 3.6 --  CL 105 103 --  CO2 24 23 --  GLUCOSE 147* 211* --  BUN 32* 39* --  CREATININE 1.79* 1.94* --  CALCIUM 9.3 9.2 --  MG -- -- 2.2  PHOS -- -- --   Liver Function Tests:  Basename 01/15/12 1306  AST 17  ALT 13  ALKPHOS 72  BILITOT 0.7  PROT 8.0  ALBUMIN 4.1    Basename 01/15/12 1306  LIPASE 75*  AMYLASE --   CBC:  Basename 01/16/12 0600 01/15/12 1306  WBC 10.2 14.5*  NEUTROABS -- 12.8*  HGB 9.2* 10.5*  HCT 28.4* 31.4*  MCV 87.7 86.0  PLT 116* 131*   Cardiac Enzymes:  Basename 01/16/12 0904 01/15/12 1402  CKTOTAL -- --  CKMB -- --  CKMBINDEX -- --  TROPONINI <0.30 <0.30   CBG:  Basename 01/16/12 0802 01/15/12 2131  GLUCAP 228* 216*   Coagulation:  Basename 01/15/12 1402  INR 1.06   Urinalysis:  Glucose 250, protein 30, nitrite negative, leukocytes negative, white blood cells 0-2 per high-power field  Imaging results:  Dg Chest 2 View  01/15/2012  *RADIOLOGY REPORT*  Clinical Data: Chest pain.  Abdominal pain.  Cough.  Diabetes. Hepatitis.  Coronary artery disease.  CHEST - 2 VIEW  Comparison: 12/14/2010  Findings: Prior CABG noted.  Atherosclerotic calcification of the aortic arch is present.  Borderline cardiomegaly noted without edema.  No pleural effusion. The lungs appear clear. Calcified mediastinal lymph nodes noted.  IMPRESSION:  1.  Borderline cardiomegaly, without edema. 2.  Prior CABG.   Original Report Authenticated By: Dellia Cloud, M.D.    Other results:  EKG: Normal sinus rhythm at 72 beats per minute, normal axis, first-degree AV block, Q waves inferiorly, no LVH by voltage, poor R wave progression,  nonspecific ST-T changes, unchanged from the previous ECG in 2012.  Assessment & Plan by Problem:  Bianca [name] is a 78 year old woman with a history of coronary artery disease status post CABG, diastolic heart failure, hypertension, hyperlipidemia, poorly controlled diabetes, and stage IV chronic kidney disease who presents with an acute cough. Review of the history and examination do not given the obvious cause for this cough. She has reflux disease on an H2 blocker and may benefit from a PPI in hopes of possibly improving her reflux. She is on an ACE inhibitor which can cause a paroxysmal cough although it is unusual to do so suddenly after being on it for a while. Nonetheless, some changes can be made in her regimen which may be beneficial to her overall well-being. Her acute on chronic renal insufficiency and hypotension may be related to overdiuresis. It may be possible to modify her regimen and keep her diastolic cardiomyopathy well compensated.  1) Acute cough: We have held her lisinopril because of the acute renal insufficiency. If the cough remains well controlled on the therapy as outlined below she can restart the lisinopril if her renal function returns to baseline as an outpatient. We are concentrating our therapy for her acute cough by changing her H2 blocker to a PPI. It is hoped that asymptomatic reflux disease may have caused her acute cough.  2) Acute on chronic renal failure: We have held the Lasix and the lisinopril. The history we obtained from the daughter suggests that Lasix may not be necessary. We will therefore hold indefinitely. We will continue to hold lisinopril until she seen in clinic. If her renal function has returned to baseline with a creatinine of about 1.6 we will restart the lisinopril and reassess her afterload. We will also reassess if she develops a recurrent cough while on the lisinopril.  3) Heart disease: We will continue the aspirin, Coreg, and Crestor. We will  also continue the isosorbide if it has helped with any symptoms of chest pain. The Lasix will be discontinued and lisinopril be held until she is seen in clinic.  4) Diabetes: We'll continue her outpatient regimen. She may require further adjustments to get better control of her diabetes as an outpatient.  5) Gastroesophageal reflux disease: This may be the cause of her acute cough. It can be asymptomatic in over 75 % of cases. She will be switched from her H2 blocker to a PPI in hopes of getting better control of her reflux if this is the cause of her acute cough.  6) Disposition: I agree with the housestaff's plan to discharge Ms. Fawcett home with followup in the [facility].

## 2012-01-18 ENCOUNTER — Encounter: Payer: Self-pay | Admitting: Internal Medicine

## 2012-01-18 ENCOUNTER — Ambulatory Visit (INDEPENDENT_AMBULATORY_CARE_PROVIDER_SITE_OTHER): Payer: Medicare Other | Admitting: Internal Medicine

## 2012-01-18 VITALS — BP 109/63 | HR 76 | Temp 97.8°F | Ht 60.0 in | Wt 108.8 lb

## 2012-01-18 DIAGNOSIS — N189 Chronic kidney disease, unspecified: Secondary | ICD-10-CM

## 2012-01-18 DIAGNOSIS — K5900 Constipation, unspecified: Secondary | ICD-10-CM

## 2012-01-18 DIAGNOSIS — R109 Unspecified abdominal pain: Secondary | ICD-10-CM

## 2012-01-18 DIAGNOSIS — R05 Cough: Secondary | ICD-10-CM

## 2012-01-18 DIAGNOSIS — I2510 Atherosclerotic heart disease of native coronary artery without angina pectoris: Secondary | ICD-10-CM

## 2012-01-18 DIAGNOSIS — I10 Essential (primary) hypertension: Secondary | ICD-10-CM

## 2012-01-18 LAB — BASIC METABOLIC PANEL WITH GFR
BUN: 27 mg/dL — ABNORMAL HIGH (ref 6–23)
CO2: 24 mEq/L (ref 19–32)
Creat: 2.29 mg/dL — ABNORMAL HIGH (ref 0.50–1.10)
GFR, Est African American: 23 mL/min — ABNORMAL LOW
GFR, Est Non African American: 20 mL/min — ABNORMAL LOW
Glucose, Bld: 231 mg/dL — ABNORMAL HIGH (ref 70–99)
Sodium: 139 mEq/L (ref 135–145)

## 2012-01-18 MED ORDER — DOCUSATE SODIUM 100 MG PO CAPS: 100.0000 mg | ORAL_CAPSULE | Freq: Every day | ORAL | Status: DC | PRN

## 2012-01-18 MED ORDER — DOCUSATE SODIUM 100 MG PO CAPS: 100.0000 mg | ORAL_CAPSULE | Freq: Every day | ORAL | Status: AC | PRN

## 2012-01-18 NOTE — Assessment & Plan Note (Addendum)
Patient is s/p of CABG at 08/2006. He has been taking both aspirin and Plavix. She doesn't have chest pain, palpitation or shortness of breath. She has been followed up with cardiology. She was recently seen by Dr. Antoine Poche. As suggested by Dr. Lonzo Cloud, I will d/c her Plavix and keep her ASA from now.

## 2012-01-18 NOTE — Assessment & Plan Note (Signed)
Patient still has mild abdominal pain. There is no signs for acute abdomen currently. In recent hospitalization,  Her liver function was normal, but she was found to have slightly elevated lipase at 75. Her previous abdominal US on 07/12/2003 showed mildly dilated common bile duct measuring 10 mm in diameter. It is concerning that the patient could have gallstone. Will get abdominal ultrasound to rule out this possibility.

## 2012-01-18 NOTE — Assessment & Plan Note (Signed)
In her recent hospitalization, she was found to have acute on chronic renal failure. Her baseline creatinine was about 1.6-1.9. Her creatinine was 2.22 at her recent admission. After discontinuation of Lasix and lisinopril, her creatinine trended down to 1.79 at the discharge. Will check her BMP. Patent was given a referral to nephrology in last visit. According to the [facility], it may take 3-4 months to get appointment.

## 2012-01-18 NOTE — Progress Notes (Signed)
Patient ID: Bianca [name], female   DOB: 1931/01/09, 78 y.o.   MRN: [id number]  Subjective:   Patient ID: Bianca [name] female   DOB: 01-Apr-1931 78 y.o.   MRN: [id number]  HPI: [name] is a 78 y.o. with a past medical history as outlined below, who presents for a hospital followup visit.  1.) Patient was recently hospitalized from 9/14 to 9/15 due to cough, chest pain and abdominal pain. She had negative workup, including troponin and pro BNP. Her chest x-ray didn't show any new issues except for mild cardiomegaly. His chest pain was considered to be  secondary to GERD. She was switched from famotidine to Protonix. Her lisinopril was discontinued given the possibility of causing cough. Today patient reports that her chest pain and cough have resolved. She still has mild abdominal pain. It is located at the right upper quadrant. It is not associated with fever, chills, nausea, vomiting or diarrhea. Patient is constipated. She did not have a bowel movement in the past 5 days.  2.) In the hospital, was found to have acute on chronic renal failure. Her baseline creatinine was about 1.6-1.9. Her creatinine was 2.22 at admission. After discontinuation of Lasix and lisinopril, her creatinine trended down to 1.79 at the discharge.  3.) UTI: Patient had symptoms for UTI in last visit. She was treated with Augmentin. Today her symptoms resolved completely. Repeated urinalysis was negative for UTI on 01/15/12.   4.) CAD: Patient is s/p of CABG at 08/2006. She has been taking both aspirin and Plavix. She does not have tendency for bleeding. She doesn't have chest pain, palpitation, shortness of breath.   5.) Leg edema:  Her leg edema has resolved completely after taking a few days of Lasix, which was given in previous visit. Her Lasix was discontinued at the recent hospital discharge.   Denies fever, chills, fatigue, headaches,  cough, chest pain, SOB, dysuria, urgency, frequency, hematuria.    Past  Medical History  Diagnosis Date  . Diabetes mellitus, type 2   . GERD (gastroesophageal reflux disease)   . Hepatitis B   . Hyperlipemia   . Hypertension   . Cervical dysplasia   . Atrophic vaginitis   . Spinal stenosis     s/p decompression  . CAD (coronary artery disease) April 2008    s/p CABG;    cath 8/12:  LM patent, pLAD occluded, OM1 30-40%, pOM2 80% and 60% after the anastomosis, pRCA occluded, S-DX occluded (old), S-OM 2 occluded (new), L-LAD patent, dLAD provided collats to the PDA; Med Rx  rec.; consider PCI to OM2 if fails med Rx  . Hx of hysterectomy    Current Outpatient Prescriptions  Medication Sig Dispense Refill  . albuterol (VENTOLIN HFA) 108 (90 BASE) MCG/ACT inhaler Inhale 1 puff into the lungs every 6 (six) hours as needed for wheezing.  1 Inhaler  6  . amLODipine (NORVASC) 5 MG tablet Take 5 mg by mouth daily.      Marland Kitchen aspirin EC 81 MG tablet Take 1 tablet (81 mg total) by mouth daily.  150 tablet  2  . carvedilol (COREG) 25 MG tablet Take 1 tablet (25 mg total) by mouth 2 (two) times daily with a meal.  90 tablet  5  . glucose blood test strip Use to check blood sugar before meals and bedtime and at 3 AM. Dx code: 250.00  150 each  12  . hydrALAZINE (APRESOLINE) 25 MG tablet Take 1 tablet (25 mg  total) by mouth 3 (three) times daily.  270 tablet  5  . insulin aspart (NOVOLOG FLEXPEN) 100 UNIT/ML injection Inject 8 Units into the skin 3 (three) times daily before meals.  15 mL  12  . insulin glargine (LANTUS) 100 UNIT/ML injection Please take 8 Units of Novolog before breakfast and Lunch, and 10 Units of Novolog before dinner.  10 mL  3  . Insulin Pen Needle (B-D UF III MINI PEN NEEDLES) 31G X 5 MM MISC 1 Units by Does not apply route 3 (three) times daily with meals. Dx code 250.00  100 each  11  . Insulin Syringe-Needle U-100 (SB INSULIN SYRINGE) 31G X 5/16" 1 ML MISC Use to inject insulin 1 time daily. Diagnosis code 250.00  100 each  11  . isosorbide mononitrate  (IMDUR) 30 MG 24 hr tablet Take 30 mg by mouth daily.      . nitroGLYCERIN (NITROSTAT) 0.4 MG SL tablet Place 0.4 mg under the tongue every 5 (five) minutes as needed. For chest pain. Maximum of 3 doses      . pantoprazole (PROTONIX) 20 MG tablet Take 2 tablets (40 mg total) by mouth daily.  14 tablet  0  . rosuvastatin (CRESTOR) 20 MG tablet Take 1 tablet (20 mg total) by mouth daily.  90 tablet  8  . docusate sodium (COLACE) 100 MG capsule Take 1 capsule (100 mg total) by mouth daily as needed for constipation.  30 capsule  3   No family history on file. History   Social History  . Marital Status: Widowed    Spouse Name: N/A    Number of Children: N/A  . Years of Education: N/A   Social History Main Topics  . Smoking status: Never Smoker   . Smokeless tobacco: None  . Alcohol Use: No  . Drug Use: No  . Sexually Active: None   Other Topics Concern  . None   Social History Narrative   Chinese immigrantHousewifeWidowedLives w/ her son   Review of Systems:  General: no fevers, chills, no changes in body weight, no changes in appetite Skin: no rash HEENT: no blurry vision, hearing changes or sore throat Pulm: no dyspnea, coughing, wheezing CV: no chest pain, has mild dry cough, no SOB. Abd: has mild abdominal pain and constipation, no nausea/vomiting,  GU: No dysuria,  No hematuria or polyuria Ext:  No leg leg edema. Neuro: no weakness, numbness, or tingling    Objective:  Physical Exam: Filed Vitals:   01/18/12 1417  BP: 109/63  Pulse: 76  Temp: 97.8 F (36.6 C)  TempSrc: Oral  Height: 5' (1.524 m)  Weight: 108 lb 12.8 oz (49.351 kg)  SpO2: 100%   General: resting in bed, not in acute distress HEENT: PERRL, EOMI, no scleral icterus Cardiac: S1/S2, RRR, No murmurs, gallops or rubs Pulm: Good air movement bilaterally, Clear to auscultation bilaterally, No rales, wheezing, rhonchi or rubs. Abd: Soft,  nondistended, mild pain over there RUQ, Murphy's sign  negative. No rebound pain, no organomegaly, BS present. There is CVA tenderness. Ext: No rashes,  2+DP/PT pulse bilaterally. No leg edema. Musculoskeletal: No joint deformities, erythema, or stiffness, ROM full and no nontender Skin: no rashes. No skin bruise. Neuro: alert and oriented X3, cranial nerves II-XII grossly intact, muscle strength 5/5 in all extremeties,  sensation to light touch intact.    Assessment & Plan:

## 2012-01-18 NOTE — Assessment & Plan Note (Signed)
Her blood pressure is well controlled. Today blood pressure is 109/63.In her recent hospitalization, her Lasix and lisinopril were discontinued. We'll observe her blood pressure closely. If her blood pressure goes up, will consider to add ARB.

## 2012-01-18 NOTE — Patient Instructions (Signed)
1. Please stop taking Plavix from now. 2. Please take all medications as prescribed.  3. If you have worsening of your symptoms or new symptoms arise, please call the clinic ([phone]), or go to the ER immediately if symptoms are severe.  You have done great job in taking all your medications. I appreciate it very much. Please continue doing that.

## 2012-01-18 NOTE — Assessment & Plan Note (Signed)
She didn't have bowel movement in the past 5 days. She was advised to take more vegetables and the food with more fibers. Will treat her with Colace and follow up.

## 2012-01-18 NOTE — Assessment & Plan Note (Addendum)
Her cough has improved. Her lisinopril was discontinued in her recent hospitalization. Currently her blood pressure is 109/63. So I will not start ARB at this moment. If her pressure goes up, will consider to start ARB.

## 2012-01-19 ENCOUNTER — Telehealth: Payer: Self-pay | Admitting: Internal Medicine

## 2012-01-19 DIAGNOSIS — N179 Acute kidney failure, unspecified: Secondary | ICD-10-CM

## 2012-01-19 NOTE — Telephone Encounter (Signed)
I called patient's home and spoke with her daughter-in law (patient does not speak [place]). Patient is doing fine. Patient is advised to come back to clinic for repeating her BMP in 7 to 10 days due to her elevated Creatinine.   Lorretta Harp, MD PGY2, Internal Medicine Teaching Service Pager: [phone]

## 2012-01-20 ENCOUNTER — Other Ambulatory Visit: Payer: Self-pay | Admitting: *Deleted

## 2012-01-20 NOTE — Telephone Encounter (Signed)
Opened in error. Stanton Kidney Odysseus Cada RN 01/20/12 3PM

## 2012-01-24 ENCOUNTER — Telehealth: Payer: Self-pay | Admitting: Internal Medicine

## 2012-01-24 NOTE — Telephone Encounter (Signed)
I call patient's home and spoke with her daughter (patient does not speak [place]). Patient is doing very well. She has good oral intake. Patient is advised again to come back to clinic for repeating her BMP this week.  Lorretta Harp, MD PGY2, Internal Medicine Teaching Service Pager: [phone]

## 2012-01-26 ENCOUNTER — Ambulatory Visit (HOSPITAL_COMMUNITY): Payer: Medicare Other

## 2012-01-28 ENCOUNTER — Other Ambulatory Visit (INDEPENDENT_AMBULATORY_CARE_PROVIDER_SITE_OTHER): Payer: Medicare Other

## 2012-01-28 DIAGNOSIS — N179 Acute kidney failure, unspecified: Secondary | ICD-10-CM

## 2012-01-28 LAB — BASIC METABOLIC PANEL WITH GFR
BUN: 29 mg/dL — ABNORMAL HIGH (ref 6–23)
CO2: 25 mEq/L (ref 19–32)
Calcium: 9.1 mg/dL (ref 8.4–10.5)
Creat: 1.7 mg/dL — ABNORMAL HIGH (ref 0.50–1.10)
GFR, Est Non African American: 28 mL/min — ABNORMAL LOW

## 2012-01-29 ENCOUNTER — Emergency Department (HOSPITAL_COMMUNITY)
Admission: EM | Admit: 2012-01-29 | Discharge: 2012-01-29 | Disposition: A | Discharge status: Home / Self Care | Payer: Medicare Other | Attending: Emergency Medicine | Admitting: Emergency Medicine

## 2012-01-29 ENCOUNTER — Other Ambulatory Visit: Payer: Self-pay

## 2012-01-29 ENCOUNTER — Encounter (HOSPITAL_COMMUNITY): Payer: Self-pay | Admitting: Emergency Medicine

## 2012-01-29 DIAGNOSIS — E162 Hypoglycemia, unspecified: Secondary | ICD-10-CM

## 2012-01-29 DIAGNOSIS — R079 Chest pain, unspecified: Secondary | ICD-10-CM | POA: Insufficient documentation

## 2012-01-29 DIAGNOSIS — Z7982 Long term (current) use of aspirin: Secondary | ICD-10-CM | POA: Insufficient documentation

## 2012-01-29 DIAGNOSIS — I2510 Atherosclerotic heart disease of native coronary artery without angina pectoris: Secondary | ICD-10-CM | POA: Insufficient documentation

## 2012-01-29 DIAGNOSIS — Z794 Long term (current) use of insulin: Secondary | ICD-10-CM | POA: Insufficient documentation

## 2012-01-29 DIAGNOSIS — E1169 Type 2 diabetes mellitus with other specified complication: Secondary | ICD-10-CM | POA: Insufficient documentation

## 2012-01-29 DIAGNOSIS — I10 Essential (primary) hypertension: Secondary | ICD-10-CM | POA: Insufficient documentation

## 2012-01-29 DIAGNOSIS — R069 Unspecified abnormalities of breathing: Secondary | ICD-10-CM | POA: Insufficient documentation

## 2012-01-29 LAB — COMPREHENSIVE METABOLIC PANEL
ALT: 18 U/L (ref 0–35)
BUN: 27 mg/dL — ABNORMAL HIGH (ref 6–23)
CO2: 20 mEq/L (ref 19–32)
Calcium: 9.5 mg/dL (ref 8.4–10.5)
Creatinine, Ser: 1.3 mg/dL — ABNORMAL HIGH (ref 0.50–1.10)
GFR calc Af Amer: 44 mL/min — ABNORMAL LOW (ref >90)
GFR calc non Af Amer: 38 mL/min — ABNORMAL LOW (ref >90)
Glucose, Bld: 53 mg/dL — ABNORMAL LOW (ref 70–99)
Sodium: 141 mEq/L (ref 135–145)
Total Protein: 6.9 g/dL (ref 6.0–8.3)

## 2012-01-29 LAB — CBC WITH DIFFERENTIAL/PLATELET
Eosinophils Absolute: 0.1 10*3/uL (ref 0.0–0.7)
Eosinophils Relative: 1 % (ref 0–5)
HCT: 29.5 % — ABNORMAL LOW (ref 36.0–46.0)
Lymphocytes Relative: 7 % — ABNORMAL LOW (ref 12–46)
Lymphs Abs: 0.7 10*3/uL (ref 0.7–4.0)
MCH: 28.4 pg (ref 26.0–34.0)
MCV: 88.3 fL (ref 78.0–100.0)
Monocytes Absolute: 0.7 10*3/uL (ref 0.1–1.0)
Platelets: 116 10*3/uL — ABNORMAL LOW (ref 150–400)
RBC: 3.34 MIL/uL — ABNORMAL LOW (ref 3.87–5.11)

## 2012-01-29 LAB — GLUCOSE, CAPILLARY
Glucose-Capillary: 88 mg/dL (ref 70–99)
Glucose-Capillary: 126 mg/dL — ABNORMAL HIGH (ref 70–99)
Glucose-Capillary: 175 mg/dL — ABNORMAL HIGH (ref 70–99)
Glucose-Capillary: 159 mg/dL — ABNORMAL HIGH (ref 70–99)

## 2012-01-29 LAB — PRO B NATRIURETIC PEPTIDE: Pro B Natriuretic peptide (BNP): 710.1 pg/mL — ABNORMAL HIGH (ref 0–450)

## 2012-01-29 MED ORDER — DEXTROSE 50 % IV SOLN: 25.0000 mL | Freq: Once | INTRAVENOUS | Status: DC | PRN

## 2012-01-29 MED ORDER — ACETAMINOPHEN 325 MG PO TABS
650.0000 mg | ORAL_TABLET | Freq: Once | ORAL | Status: AC
  Administered 2012-01-29: 650 mg via ORAL
  Filled 2012-01-29: qty 2

## 2012-01-29 NOTE — ED Provider Notes (Signed)
This patient is brought to the CDU for observation on hypoglycemia protocol. This is a shared visit with Dr. Rulon Abide.  The patient is resting in her bed comfortably. She is accompanied by her grandson, who acts as Nurse, learning disability.  S: The patient states that she is feeling better. She says that her back hurts.  O: A&O x4, normocephalic, PERRL,EOM intact, RRR, no m/r/g, CTAB, no w/r/r, moves all 4 extremities with strength and sensation intact, no peripheral edema, 2+ distal pulses.  A: Hypoglycemia  P: Per Dr. (Resident), working with Dr. Rulon Abide, the patient is to be evaluated for 3 hours in the CDU and be placed on hypoglycemia protocol. If the patient remains stable for the allotted time frame, she will be discharged to home. Will give by mouth Tylenol for back pain. Will check CBGs hourly.  8:23 PM CBGs- 17:56- 126 19:37- 175  9:00 I have signed out on this patient.  Care will be continued by Dr. Rulon Abide.  This patient originally arrived at the emergency department for treatment of hypoglycemia. She was transferred to the CDU and placed on hypoglycemia protocol. While in the CDU she has received D50 and hourly CBGs as listed above. The plan upon her arrival in the CDU was for her to be stable for 3 hours, and then to be discharged to home.  At 21:00 care was reassigned to Dr. Rulon Abide at signout.    Roxy Horseman, PA-C 01/29/12 2053

## 2012-01-29 NOTE — ED Notes (Signed)
Patients family stated that the patient has been with them all day. She administered her insulin today but dont remember how much she ate today. Family stated that when they got home the patient sat down on the couch. They went to wake her to go to a event and the patient would not wake up. EMS was called and fire dept arrived first and the patients respirations were low enough that the fire dept was administering oxygen, via ambu bag. EMS stated they check her sugar and it was 25, that pushed an amp of D50 and the patient began to arouse but still lethargic at times.

## 2012-01-29 NOTE — ED Notes (Signed)
Report called to [facility] in CDU

## 2012-01-29 NOTE — ED Provider Notes (Signed)
History     CSN: [id number]  Arrival date & time 01/29/12  1539   First MD Initiated Contact with Patient 01/29/12 1607      Chief Complaint  Patient presents with  . Hypoglycemia    (Consider location/radiation/quality/duration/timing/severity/associated sxs/prior treatment) Patient is a 78 y.o. female presenting with altered mental status. The history is provided by the patient. The history is limited by a language barrier. A language interpreter was used.  Altered Mental Status This is a new problem. The current episode started today. The problem has been resolved. Pertinent negatives include no abdominal pain, anorexia, arthralgias, change in bowel habit, chest pain, chills, congestion, coughing, diaphoresis, fatigue, fever, headaches, joint swelling, myalgias, nausea, neck pain, numbness, rash, sore throat, swollen glands, urinary symptoms, vertigo, visual change, vomiting or weakness. Nothing aggravates the symptoms. She has tried nothing for the symptoms.    Past Medical History  Diagnosis Date  . Diabetes mellitus, type 2   . GERD (gastroesophageal reflux disease)   . Hepatitis B   . Hyperlipemia   . Hypertension   . Cervical dysplasia   . Atrophic vaginitis   . Spinal stenosis     s/p decompression  . CAD (coronary artery disease) April 2008    s/p CABG;    cath 8/12:  LM patent, pLAD occluded, OM1 30-40%, pOM2 80% and 60% after the anastomosis, pRCA occluded, S-DX occluded (old), S-OM 2 occluded (new), L-LAD patent, dLAD provided collats to the PDA; Med Rx  rec.; consider PCI to OM2 if fails med Rx  . Hx of hysterectomy     Past Surgical History  Procedure Date  . Back surgery     decompression L3-S1  . Coronary artery bypass graft April 2008  . Total abdominal hysterectomy     cervical dysplasia    History reviewed. No pertinent family history.  History  Substance Use Topics  . Smoking status: Never Smoker   . Smokeless tobacco: Not on file  . Alcohol  Use: No    OB History    Grav Para Term Preterm Abortions TAB SAB Ect Mult Living                  Review of Systems  Constitutional: Negative for fever, chills, diaphoresis, activity change, appetite change and fatigue.  HENT: Negative for ear pain, congestion, sore throat, rhinorrhea and neck pain.   Eyes: Negative for pain.  Respiratory: Negative for cough and shortness of breath.   Cardiovascular: Negative for chest pain and palpitations.  Gastrointestinal: Negative for nausea, vomiting, abdominal pain, anorexia and change in bowel habit.  Genitourinary: Negative for dysuria, difficulty urinating and pelvic pain.  Musculoskeletal: Negative for myalgias, back pain, joint swelling and arthralgias.  Skin: Negative for rash and wound.  Neurological: Negative for vertigo, weakness, numbness and headaches.  Psychiatric/Behavioral: Positive for confusion (resolved) and altered mental status. Negative for behavioral problems and agitation.    Allergies  Cozaar  Home Medications   Current Outpatient Rx  Name Route Sig Dispense Refill  . ALBUTEROL SULFATE HFA 108 (90 BASE) MCG/ACT IN AERS Inhalation Inhale 1 puff into the lungs every 6 (six) hours as needed for wheezing. 1 Inhaler 6  . AMLODIPINE BESYLATE 5 MG PO TABS Oral Take 5 mg by mouth daily.    . ASPIRIN EC 81 MG PO TBEC Oral Take 1 tablet (81 mg total) by mouth daily. 150 tablet 2  . CARVEDILOL 25 MG PO TABS Oral Take 1 tablet (25 mg total)  by mouth 2 (two) times daily with a meal. 90 tablet 5  . DOCUSATE SODIUM 100 MG PO CAPS Oral Take 1 capsule (100 mg total) by mouth daily as needed for constipation. 30 capsule 3  . HYDRALAZINE HCL 25 MG PO TABS Oral Take 1 tablet (25 mg total) by mouth 3 (three) times daily. 270 tablet 5  . INSULIN ASPART 100 UNIT/ML [place] SOLN Subcutaneous Inject 8-10 Units into the skin 3 (three) times daily before meals. Take 8 units in the morning and before lunch, take 10 units before dinner.    .  ISOSORBIDE MONONITRATE ER 30 MG PO TB24 Oral Take 30 mg by mouth daily.    Marland Kitchen NITROGLYCERIN 0.4 MG SL SUBL Sublingual Place 0.4 mg under the tongue every 5 (five) minutes as needed. For chest pain. Maximum of 3 doses    . PANTOPRAZOLE SODIUM 20 MG PO TBEC Oral Take 2 tablets (40 mg total) by mouth daily. 14 tablet 0  . ROSUVASTATIN CALCIUM 20 MG PO TABS Oral Take 1 tablet (20 mg total) by mouth daily. 90 tablet 8    There were no vitals taken for this visit.  Physical Exam  Constitutional: She is oriented to person, place, and time. She appears well-developed and well-nourished. No distress.  HENT:  Head: Normocephalic and atraumatic.  Nose: Nose normal.  Mouth/Throat: Oropharynx is clear and moist.  Eyes: EOM are normal. Pupils are equal, round, and reactive to light.  Neck: Normal range of motion. Neck supple. No tracheal deviation present.  Cardiovascular: Normal rate, regular rhythm, normal heart sounds and intact distal pulses.   Pulmonary/Chest: Effort normal and breath sounds normal. She has no rales.  Abdominal: Soft. Bowel sounds are normal. She exhibits no distension. There is no tenderness. There is no rebound and no guarding.  Musculoskeletal: Normal range of motion. She exhibits no tenderness.  Neurological: She is alert and oriented to person, place, and time. No cranial nerve deficit. GCS eye subscore is 4. GCS verbal subscore is 5. GCS motor subscore is 6.  Skin: Skin is warm and dry. No rash noted.  Psychiatric: She has a normal mood and affect. Her behavior is normal.    ED Course  Procedures (including critical care time)     Results for orders placed during the hospital encounter of 01/29/12  GLUCOSE, CAPILLARY      Component Value Range   Glucose-Capillary 88  70 - 99 mg/dL   Comment 1 Documented in Chart     Comment 2 Notify RN    CBC WITH DIFFERENTIAL      Component Value Range   WBC 10.6 (*) 4.0 - 10.5 K/uL   RBC 3.34 (*) 3.87 - 5.11 MIL/uL   Hemoglobin  9.5 (*) 12.0 - 15.0 g/dL   HCT 14.7 (*) 82.9 - 56.2 %   MCV 88.3  78.0 - 100.0 fL   MCH 28.4  26.0 - 34.0 pg   MCHC 32.2  30.0 - 36.0 g/dL   RDW 13.0  86.5 - 78.4 %   Platelets 116 (*) 150 - 400 K/uL   Neutrophils Relative 86 (*) 43 - 77 %   Neutro Abs 9.1 (*) 1.7 - 7.7 K/uL   Lymphocytes Relative 7 (*) 12 - 46 %   Lymphs Abs 0.7  0.7 - 4.0 K/uL   Monocytes Relative 6  3 - 12 %   Monocytes Absolute 0.7  0.1 - 1.0 K/uL   Eosinophils Relative 1  0 - 5 %  Eosinophils Absolute 0.1  0.0 - 0.7 K/uL   Basophils Relative 0  0 - 1 %   Basophils Absolute 0.0  0.0 - 0.1 K/uL  COMPREHENSIVE METABOLIC PANEL      Component Value Range   Sodium 141  135 - 145 mEq/L   Potassium 3.8  3.5 - 5.1 mEq/L   Chloride 108  96 - 112 mEq/L   CO2 20  19 - 32 mEq/L   Glucose, Bld 53 (*) 70 - 99 mg/dL   BUN 27 (*) 6 - 23 mg/dL   Creatinine, Ser 1.61 (*) 0.50 - 1.10 mg/dL   Calcium 9.5  8.4 - 09.6 mg/dL   Total Protein 6.9  6.0 - 8.3 g/dL   Albumin 3.7  3.5 - 5.2 g/dL   AST 21  0 - 37 U/L   ALT 18  0 - 35 U/L   Alkaline Phosphatase 59  39 - 117 U/L   Total Bilirubin 0.5  0.3 - 1.2 mg/dL   GFR calc non Af Amer 38 (*) >90 mL/min   GFR calc Af Amer 44 (*) >90 mL/min  PRO B NATRIURETIC PEPTIDE      Component Value Range   Pro B Natriuretic peptide (BNP) 710.1 (*) 0 - 450 pg/mL  GLUCOSE, CAPILLARY      Component Value Range   Glucose-Capillary 126 (*) 70 - 99 mg/dL  GLUCOSE, CAPILLARY      Component Value Range   Glucose-Capillary 175 (*) 70 - 99 mg/dL  GLUCOSE, CAPILLARY      Component Value Range   Glucose-Capillary 159 (*) 70 - 99 mg/dL     1. Hypoglycemia       MDM    78 yo F in in no acute distress, afebrile, vital signs stable, non toxic appearing who presented with hypoglycemia. Patient reports having taken her lunch time insulin but forgetting to eat while she watched TV. Patient became difficult to arouse.  Per EMS report BS was 25 on scene. Patient received amp of D50 and subsequently  was arousable.  On arrival patient A&O x 3 and communicative.  Interpreter used for exam and history.  Patient was observed and monitored closely. Sugar levels stable. Thorough discussion with the family and patient on plan and return precautions. Also discussed importance of consistent carbohydrate diet. Will follow up with PCP for DM education and insulin management. Patient expressed understanding.         Nadara Mustard, MD 01/30/12 [phone]

## 2012-01-30 NOTE — ED Provider Notes (Signed)
Medical screening examination/treatment/procedure(s) were conducted as a shared visit with non-physician practitioner(s) and myself.  I personally evaluated the patient during the encounter Jones Skene, M.D.  Patient sent to the CDU stable for observation for hypoglycemia due to administration of insulin without taking any by mouth food.  Jones Skene, MD 01/30/12 [phone]

## 2012-01-31 ENCOUNTER — Other Ambulatory Visit: Payer: Self-pay | Admitting: Physician Assistant

## 2012-02-01 ENCOUNTER — Ambulatory Visit (INDEPENDENT_AMBULATORY_CARE_PROVIDER_SITE_OTHER): Payer: Medicare Other | Admitting: Internal Medicine

## 2012-02-01 ENCOUNTER — Encounter: Payer: Self-pay | Admitting: Internal Medicine

## 2012-02-01 VITALS — BP 114/63 | HR 71 | Temp 97.6°F | Ht 60.0 in

## 2012-02-01 DIAGNOSIS — Z0000 Encounter for general adult medical examination without abnormal findings: Secondary | ICD-10-CM

## 2012-02-01 DIAGNOSIS — K219 Gastro-esophageal reflux disease without esophagitis: Secondary | ICD-10-CM

## 2012-02-01 DIAGNOSIS — Z23 Encounter for immunization: Secondary | ICD-10-CM

## 2012-02-01 DIAGNOSIS — I10 Essential (primary) hypertension: Secondary | ICD-10-CM

## 2012-02-01 DIAGNOSIS — E119 Type 2 diabetes mellitus without complications: Secondary | ICD-10-CM

## 2012-02-01 LAB — GLUCOSE, CAPILLARY: Glucose-Capillary: 69 mg/dL — ABNORMAL LOW (ref 70–99)

## 2012-02-01 MED ORDER — PANTOPRAZOLE SODIUM 20 MG PO TBEC: 40.0000 mg | DELAYED_RELEASE_TABLET | Freq: Every day | ORAL | Status: DC

## 2012-02-01 MED ORDER — AMLODIPINE BESYLATE 5 MG PO TABS: 5.0000 mg | ORAL_TABLET | Freq: Every day | ORAL | Status: DC

## 2012-02-01 NOTE — Progress Notes (Signed)
Patient ID: Bianca [name], female   DOB: [birth year], 78 y.o.   MRN: [id number]  Subjective:   Patient ID: Bianca [name] female   DOB: October 29, 1930 78 y.o.   MRN: [id number]  HPI: Bianca [name] is a 78 y.o. past medical history as outlined below, who presents for a followup visit following her recent visit to ED.  Patient was brought to ED on 01/29/12 because altered mental status secondary to hypoglycemia. Patient normally takes Lantus 25 units daily and NovoLog 3 times before meals. Patient reports that on 01/29/12 she took her lunch time insulin but forgetting to eat while she watched TV. Patient became difficult to arouse. Per EMS report BS was 25 on scene. Patient received amp of D50 and subsequently was arousable at ED. She was discharged from the ED at stable condition. After she went home, she resumed her normal dose of Lantus and NovoLog. She has been doing well. She didn't have new episode of hypoglycemia. Today she does not have any new complaints. She feels good today.  Of note, in her previous visit, due to abdominal pain, abdominal ultrasound was ordered. But she missed her appointment and ultrasound was not done yet. Her abdominal pain resolved. Today she does not have any abdominal pain. She would like to skip the abdominal ultrasound.  Denies fever, chills, fatigue, headaches,  cough, chest pain, SOB,  abdominal pain,diarrhea, constipation, dysuria, urgency, frequency, hematuria.   Past Medical History  Diagnosis Date  . Diabetes mellitus, type 2   . GERD (gastroesophageal reflux disease)   . Hepatitis B   . Hyperlipemia   . Hypertension   . Cervical dysplasia   . Atrophic vaginitis   . Spinal stenosis     s/p decompression  . CAD (coronary artery disease) April 2008    s/p CABG;    cath 8/12:  LM patent, pLAD occluded, OM1 30-40%, pOM2 80% and 60% after the anastomosis, pRCA occluded, S-DX occluded (old), S-OM 2 occluded (new), L-LAD patent, dLAD provided collats to the PDA; Med  Rx  rec.; consider PCI to OM2 if fails med Rx  . Hx of hysterectomy    Current Outpatient Prescriptions  Medication Sig Dispense Refill  . albuterol (VENTOLIN HFA) 108 (90 BASE) MCG/ACT inhaler Inhale 1 puff into the lungs every 6 (six) hours as needed for wheezing.  1 Inhaler  6  . amLODipine (NORVASC) 5 MG tablet Take 1 tablet (5 mg total) by mouth daily.  30 tablet  11  . aspirin EC 81 MG tablet Take 1 tablet (81 mg total) by mouth daily.  150 tablet  2  . carvedilol (COREG) 25 MG tablet Take 1 tablet (25 mg total) by mouth 2 (two) times daily with a meal.  90 tablet  5  . docusate sodium (COLACE) 100 MG capsule Take 1 capsule (100 mg total) by mouth daily as needed for constipation.  30 capsule  3  . hydrALAZINE (APRESOLINE) 25 MG tablet Take 1 tablet (25 mg total) by mouth 3 (three) times daily.  270 tablet  5  . insulin aspart (NOVOLOG) 100 UNIT/ML injection Inject 8-10 Units into the skin 3 (three) times daily before meals. Take 8 units in the morning and before lunch, take 10 units before dinner.      . insulin glargine (LANTUS) 100 UNIT/ML injection Inject 25 Units into the skin at bedtime.      . isosorbide mononitrate (IMDUR) 30 MG 24 hr tablet Take 30 mg by mouth daily.      Marland Kitchen  nitroGLYCERIN (NITROSTAT) 0.4 MG SL tablet Place 0.4 mg under the tongue every 5 (five) minutes as needed. For chest pain. Maximum of 3 doses      . pantoprazole (PROTONIX) 20 MG tablet Take 2 tablets (40 mg total) by mouth daily.  30 tablet  5  . pantoprazole (PROTONIX) 20 MG tablet Take 2 tablets (40 mg total) by mouth daily.  14 tablet  0  . rosuvastatin (CRESTOR) 20 MG tablet Take 1 tablet (20 mg total) by mouth daily.  90 tablet  8  . DISCONTD: pantoprazole (PROTONIX) 20 MG tablet Take 2 tablets (40 mg total) by mouth daily.  14 tablet  0   No family history on file. History   Social History  . Marital Status: Widowed    Spouse Name: N/A    Number of Children: N/A  . Years of Education: N/A    Social History Main Topics  . Smoking status: Never Smoker   . Smokeless tobacco: None  . Alcohol Use: No  . Drug Use: No  . Sexually Active: None   Other Topics Concern  . None   Social History Narrative   Chinese immigrantHousewifeWidowedLives w/ her son   Review of Systems: General: no fevers, chills, no changes in body weight, no changes in appetite Skin: no rash HEENT: no blurry vision, hearing changes or sore throat Pulm: no dyspnea, coughing, wheezing CV: no chest pain, palpitations, shortness of breath Abd: no nausea/vomiting, abdominal pain, diarrhea/constipation GU: no dysuria, hematuria, polyuria Ext: no arthralgias, myalgias Neuro: no weakness, numbness, or tingling   Objective:  Physical Exam: Filed Vitals:   02/01/12 1614  BP: 114/63  Pulse: 71  Temp: 97.6 F (36.4 C)  TempSrc: Oral  Height: 5' (1.524 m)  SpO2: 98%  General: resting in bed, not in acute distress HEENT: PERRL, EOMI, no scleral icterus Cardiac: S1/S2, RRR, No murmurs, gallops or rubs Pulm: Good air movement bilaterally, Clear to auscultation bilaterally, No rales, wheezing, rhonchi or rubs. Abd: Soft,  nondistended, nontender, no rebound pain, no organomegaly, BS present Ext: No rashes or edema, 2+DP/PT pulse bilaterally Musculoskeletal: No joint deformities, erythema, or stiffness, ROM full and nontender Skin: no rashes. No skin bruise. Neuro: alert and oriented X3, cranial nerves II-XII grossly intact, muscle strength 5/5 in all extremeties,  sensation to light touch intact.  Psych.: patient is not psychotic, no suicidal or hemocidal ideation.   Assessment & Plan:

## 2012-02-01 NOTE — Assessment & Plan Note (Signed)
-  Foot examination, A1c, lipid profile, pneumococcal vaccination are up-to-date.  -Patient refused eye examination, colonoscopy,TDaP, will postpone.  -Zostavax was done in 01/2012 -Will give flu shot today.

## 2012-02-01 NOTE — Assessment & Plan Note (Signed)
Patient is currently taking Lantus 25 units daily and NovoLog 3 times a day before meals (8 units before breakfast and lunch, and 10 units before dinner). Patient had hypoglycemia episode on 01/29/12 secondary to not having eaten food. Currently patient is doing well with her normal dose of insulin. She did not have new episode of hypoglycemia after discharged from the ED on 01/29/12. Will continue current regimen. Patient does not speak [place]. She was accompanied by her daughter who speaks [place]. I educated patient in [place]. Patient was instructed to not take her NovoLog if she does not eat food.

## 2012-02-01 NOTE — Patient Instructions (Signed)
1. please take Lantus 25 units at bedtime. Please take 8 units of NovoLog before breakfast and  lunch, and 10 units of NovoLog at bedtime. It is very important that you should not take NovoLog if you do not eat food.  2. Please take all medications as prescribed.  3. If you have worsening of your symptoms or new symptoms arise, please call the clinic ([phone]), or go to the ER immediately if symptoms are severe.   Hypoglycemia (Low Blood Sugar) Hypoglycemia is when the glucose (sugar) in your blood is too low. Hypoglycemia can happen for many reasons. It can happen to people with or without diabetes. Hypoglycemia can develop quickly and can be a medical emergency.  CAUSES  Having hypoglycemia does not mean that you will develop diabetes. Different causes include:  Missed or delayed meals or not enough carbohydrates eaten.  Medication overdose. This could be by accident or deliberate. If by accident, your medication may need to be adjusted or changed.  Exercise or increased activity without adjustments in carbohydrates or medications.  A nerve disorder that affects body functions like your heart rate, blood pressure and digestion (autonomic neuropathy).  A condition where the stomach muscles do not function properly (gastroparesis). Therefore, medications may not absorb properly.  The inability to recognize the signs of hypoglycemia (hypoglycemic unawareness).  Absorption of insulin  may be altered.  Alcohol consumption.  Pregnancy/menstrual cycles/postpartum. This may be due to hormones.  Certain kinds of tumors. This is very rare. SYMPTOMS   Sweating.  Hunger.  Dizziness.  Blurred vision.  Drowsiness.  Weakness.  Headache.  Rapid heart beat.  Shakiness.  Nervousness. DIAGNOSIS  Diagnosis is made by monitoring blood glucose in one or all of the following ways:  Fingerstick blood glucose monitoring.  Laboratory results. TREATMENT  If you think your blood  glucose is low:  Check your blood glucose, if possible. If it is less than 70 mg/dl, take one of the following:  3-4 glucose tablets.   cup juice (prefer clear like apple).   cup "regular" soda pop.  1 cup milk.  -1 tube of glucose gel.  5-6 hard candies.  Do not over treat because your blood glucose (sugar) will only go too high.  Wait 15 minutes and recheck your blood glucose. If it is still less than 70 mg/dl (or below your target range), repeat treatment.  Eat a snack if it is more than one hour until your next meal. Sometimes, your blood glucose may go so low that you are unable to treat yourself. You may need someone to help you. You may even pass out or be unable to swallow. This may require you to get an injection of glucagon, which raises the blood glucose. HOME CARE INSTRUCTIONS  Check blood glucose as recommended by your caregiver.  Take medication as prescribed by your caregiver.  Follow your meal plan. Do not skip meals. Eat on time.  If you are going to drink alcohol, drink it only with meals.  Check your blood glucose before driving.  Check your blood glucose before and after exercise. If you exercise longer or different than usual, be sure to check blood glucose more frequently.  Always carry treatment with you. Glucose tablets are the easiest to carry.  Always wear medical alert jewelry or carry some form of identification that states that you have diabetes. This will alert people that you have diabetes. If you have hypoglycemia, they will have a better idea on what to do. SEEK  MEDICAL CARE IF:   You are having problems keeping your blood sugar at target range.  You are having frequent episodes of hypoglycemia.  You feel you might be having side effects from your medicines.  You have symptoms of an illness that is not improving after 3-4 days.  You notice a change in vision or a new problem with your vision. SEEK IMMEDIATE MEDICAL CARE IF:   You  are a family member or friend of a person whose blood glucose goes below 70 mg/dl and is accompanied by:  Confusion.  A change in mental status.  The inability to swallow.  Passing out. Document Released: 04/19/2005 Document Revised: 07/12/2011 Document Reviewed: 08/16/2011 [facility] Patient Information 2013 [place], [place].

## 2012-02-02 NOTE — ED Provider Notes (Signed)
I was present consultation and examined the patient in question during their ED stay and agree with the resident's documentation and resident's medical plan.  I discussed the discharge plan with family and patient-family was translating for the patient, and they understand.  Patient took an insulin shot and then felt hungry-so she did not eat. Patient is cautioned that if she uses insulin, she must eat or drink in order to avoid hypoglycemia. Spent extensive time at the bedside explaining this. Patient's blood sugars have been stable for an extended period of time - she is safe to discharge to home in good condition   Jones Skene, MD   Jones Skene, MD 02/02/12 1240

## 2012-02-15 ENCOUNTER — Encounter: Payer: Medicare Other | Admitting: Internal Medicine

## 2012-03-07 ENCOUNTER — Encounter: Payer: Medicare Other | Admitting: Internal Medicine

## 2012-03-07 ENCOUNTER — Encounter: Payer: Self-pay | Admitting: Internal Medicine

## 2012-03-07 ENCOUNTER — Ambulatory Visit (INDEPENDENT_AMBULATORY_CARE_PROVIDER_SITE_OTHER): Payer: Medicare Other | Admitting: Internal Medicine

## 2012-03-07 VITALS — BP 101/58 | HR 78 | Temp 97.3°F | Ht 60.0 in | Wt 115.8 lb

## 2012-03-07 DIAGNOSIS — E119 Type 2 diabetes mellitus without complications: Secondary | ICD-10-CM

## 2012-03-07 DIAGNOSIS — N189 Chronic kidney disease, unspecified: Secondary | ICD-10-CM

## 2012-03-07 DIAGNOSIS — I10 Essential (primary) hypertension: Secondary | ICD-10-CM

## 2012-03-07 DIAGNOSIS — Z0000 Encounter for general adult medical examination without abnormal findings: Secondary | ICD-10-CM

## 2012-03-07 DIAGNOSIS — M549 Dorsalgia, unspecified: Secondary | ICD-10-CM

## 2012-03-07 DIAGNOSIS — D631 Anemia in chronic kidney disease: Secondary | ICD-10-CM | POA: Insufficient documentation

## 2012-03-07 DIAGNOSIS — D649 Anemia, unspecified: Secondary | ICD-10-CM

## 2012-03-07 MED ORDER — FOLIC ACID 1 MG PO TABS: 1.0000 mg | ORAL_TABLET | Freq: Every day | ORAL | Status: AC

## 2012-03-07 NOTE — Assessment & Plan Note (Signed)
Patient has chronic anemia. It is likely related to her chronic kidney disease. Recent workup showed ferritin 36 and normal RBC folate and. We'll check her iron level and total binding capacity.

## 2012-03-07 NOTE — Progress Notes (Signed)
Patient ID: Bianca [name], female   DOB: 01-Oct-1930, 78 y.o.   MRN: [id number]  Subjective:   Patient ID: Bianca [name] female   DOB: 03/17/1931 78 y.o.   MRN: [id number]  HPI: [name] is a 78 y.o. with past medical history as outlined below, who presents for a followup visit.  1). DM-II: Her last A1c was 12.5 at 12/21/11 to We adjusted her insulin regimen for several times in the past several months. Currently patient is taking Lantus 25 units and NovoLog 3 times a day before meals. She did not have symptoms for hypoglycemia. Her meter record shows morning CBG is between 100-140 most of the times.   4.) HTN: She is currently taking amlodipine 5 mg daily, lisinopril 5 mg daily, hydralazine 25 mg 3 times a day and carvedilol 25 mg twice a day. Patient is also on isosorbide monitrite. Today her blood pressure is 101/58. Patient does not have chest pain, shortness of breath, or  palpitation. She has trace amount of bilateral lower leg edema. There is no pain at the calf areas.  2). Back pain: Patient has lower back pain started about 7 days ago. This is a new issue. Her pain is 8/10 in severity, sharp in nature and non-radiation. It is aggravated by walking or standing. It is alleviated by having rest or laying on the bed. Patient does not have weakness or numbness in her lower extremities. She does not have urinary incontinence or lose control full bowel movement. Patient does not have fever or chills. Her pain is well controlled by over-the-counter Tylenol and Advil.    Past Medical History  Diagnosis Date  . Diabetes mellitus, type 2   . GERD (gastroesophageal reflux disease)   . Hepatitis B   . Hyperlipemia   . Hypertension   . Cervical dysplasia   . Atrophic vaginitis   . Spinal stenosis     s/p decompression  . CAD (coronary artery disease) April 2008    s/p CABG;    cath 8/12:  LM patent, pLAD occluded, OM1 30-40%, pOM2 80% and 60% after the anastomosis, pRCA occluded, S-DX  occluded (old), S-OM 2 occluded (new), L-LAD patent, dLAD provided collats to the PDA; Med Rx  rec.; consider PCI to OM2 if fails med Rx  . Hx of hysterectomy    Current Outpatient Prescriptions  Medication Sig Dispense Refill  . albuterol (VENTOLIN HFA) 108 (90 BASE) MCG/ACT inhaler Inhale 1 puff into the lungs every 6 (six) hours as needed for wheezing.  1 Inhaler  6  . amLODipine (NORVASC) 5 MG tablet Take 1 tablet (5 mg total) by mouth daily.  30 tablet  11  . aspirin EC 81 MG tablet Take 1 tablet (81 mg total) by mouth daily.  150 tablet  2  . carvedilol (COREG) 25 MG tablet Take 1 tablet (25 mg total) by mouth 2 (two) times daily with a meal.  90 tablet  5  . docusate sodium (COLACE) 100 MG capsule Take 1 capsule (100 mg total) by mouth daily as needed for constipation.  30 capsule  3  . folic acid (FOLVITE) 1 MG tablet Take 1 tablet (1 mg total) by mouth daily.  100 tablet  3  . hydrALAZINE (APRESOLINE) 25 MG tablet Take 1 tablet (25 mg total) by mouth 3 (three) times daily.  270 tablet  5  . insulin aspart (NOVOLOG) 100 UNIT/ML injection Inject 8-10 Units into the skin 3 (three) times daily before meals.  Take 8 units in the morning and before lunch, take 10 units before dinner.      . insulin glargine (LANTUS) 100 UNIT/ML injection Inject 25 Units into the skin at bedtime.      . isosorbide mononitrate (IMDUR) 30 MG 24 hr tablet Take 30 mg by mouth daily.      . nitroGLYCERIN (NITROSTAT) 0.4 MG SL tablet Place 0.4 mg under the tongue every 5 (five) minutes as needed. For chest pain. Maximum of 3 doses      . pantoprazole (PROTONIX) 20 MG tablet Take 2 tablets (40 mg total) by mouth daily.  30 tablet  5  . pantoprazole (PROTONIX) 20 MG tablet Take 2 tablets (40 mg total) by mouth daily.  14 tablet  0  . rosuvastatin (CRESTOR) 20 MG tablet Take 1 tablet (20 mg total) by mouth daily.  90 tablet  8   No family history on file. History   Social History  . Marital Status: Widowed    Spouse  Name: N/A    Number of Children: N/A  . Years of Education: N/A   Social History Main Topics  . Smoking status: Never Smoker   . Smokeless tobacco: None  . Alcohol Use: No  . Drug Use: No  . Sexually Active: None   Other Topics Concern  . 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 Back: has back pain Neuro: no weakness, numbness, or tingling  Objective:  Physical Exam: Filed Vitals:   03/07/12 1333  BP: 101/58  Pulse: 78  Temp: 97.3 F (36.3 C)  TempSrc: Oral  Height: 5' (1.524 m)  Weight: 115 lb 12.8 oz (52.527 kg)  SpO2: 98%   General: resting in bed, not in acute distress HEENT: PERRL, EOMI, no scleral icterus Cardiac: S1/S2, RRR, No murmurs, gallops or rubs Pulm: Good air movement bilaterally, Clear to auscultation bilaterally, No rales, wheezing, rhonchi or rubs. Abd: Soft,  nondistended, nontender, no rebound pain, no organomegaly, BS present. There is CVA tenderness. Ext: No rashes,  2+DP/PT pulse bilaterally. Has trace pitting edema in legs and ankles b/l, There is no tenderness, redness or swelling in calf areas.  Musculoskeletal: there is tenderness over her lower back at midline. ROM normal.  Skin: no rashes. No skin bruise. Neuro: alert and oriented X3, cranial nerves II-XII grossly intact, muscle strength 5/5 in all extremeties, sensation to light touch intact. Leg raise test negative.   Assessment & Plan:

## 2012-03-07 NOTE — Patient Instructions (Signed)
1. You have done great job in taking all your medications. I appreciate it very much. Please continue doing that. Please tylenol for your back pain instead of Advil.  2. Please take all medications as prescribed.  3. If you have worsening of your symptoms or new symptoms arise, please call the clinic ([phone]), or go to the ER immediately if symptoms are severe.

## 2012-03-07 NOTE — Assessment & Plan Note (Signed)
-  Foot exam was done. There is no or ulcers. She has good DP/PT pulse.

## 2012-03-07 NOTE — Assessment & Plan Note (Addendum)
Patient has chronic kidney disease, stage III. It is most likely due to combination of hypertension and diabetes. Patient was given a referral to nephrology. She has not been seen yet due to lack of slot. Will go ahead to check patient's PTH.

## 2012-03-07 NOTE — Assessment & Plan Note (Addendum)
Hypertension is well controlled. Today blood pressure is 101/58. She does not have chest pain, shortness of breath or palpitation. Patient has trace amount of leg edema which might be related to amlodipine. Since her leg edema is very minimal, I will not discontinue her amlodipine since blood pressure control is more important at this moment. I will continue current regimen and see her back within a month.

## 2012-03-07 NOTE — Assessment & Plan Note (Signed)
Patient's back pain is new issue. Given her old age, it is likely due to osteoarthritis or degenerative vertebral disease. However other serious issues are also on the differential list, such as multiple myeloma. However patient's past calcium level was normal. She has chronic anemia which can be at least partially explained by her chronic kidney disease. There is no alarming symptoms currently. Physical examination did not show any signs of cord compression. Since her symptoms is well controlled but over-the-counter Tylenol and Advil, we'll observe her for another month. Patient was advised to take Tylenol instead of Advil due to her chronic kidney disease. She agreed to do so. I will see her within one month for reevaluation.

## 2012-03-07 NOTE — Assessment & Plan Note (Signed)
Diabetes is better controlled, as evidenced by her meter record. She did not have symptoms of hypoglycemia We will continue current regimen and check her A1c in next visit.

## 2012-03-08 LAB — PTH, INTACT AND CALCIUM
Calcium, Total (PTH): 8.7 mg/dL (ref 8.4–10.5)
PTH: 118.8 pg/mL — ABNORMAL HIGH (ref 14.0–72.0)

## 2012-03-08 LAB — IRON AND TIBC: UIBC: 303 ug/dL (ref 125–400)

## 2012-03-10 ENCOUNTER — Other Ambulatory Visit: Payer: Self-pay | Admitting: Internal Medicine

## 2012-03-10 DIAGNOSIS — N2581 Secondary hyperparathyroidism of renal origin: Secondary | ICD-10-CM

## 2012-03-10 DIAGNOSIS — D509 Iron deficiency anemia, unspecified: Secondary | ICD-10-CM

## 2012-03-10 MED ORDER — CALCITRIOL 0.25 MCG PO CAPS: 0.2500 ug | ORAL_CAPSULE | Freq: Every day | ORAL | Status: DC

## 2012-03-10 MED ORDER — FERROUS SULFATE 325 (65 FE) MG PO TABS: 325.0000 mg | ORAL_TABLET | Freq: Three times a day (TID) | ORAL | Status: DC

## 2012-03-23 ENCOUNTER — Other Ambulatory Visit (INDEPENDENT_AMBULATORY_CARE_PROVIDER_SITE_OTHER): Payer: Medicare Other

## 2012-03-23 DIAGNOSIS — D649 Anemia, unspecified: Secondary | ICD-10-CM

## 2012-03-23 LAB — POC HEMOCCULT BLD/STL (HOME/3-CARD/SCREEN)

## 2012-04-11 ENCOUNTER — Ambulatory Visit (INDEPENDENT_AMBULATORY_CARE_PROVIDER_SITE_OTHER): Payer: Medicare Other | Admitting: Internal Medicine

## 2012-04-11 ENCOUNTER — Encounter: Payer: Self-pay | Admitting: Internal Medicine

## 2012-04-11 VITALS — BP 97/57 | HR 71 | Temp 97.8°F | Ht 60.0 in | Wt 112.3 lb

## 2012-04-11 DIAGNOSIS — Z0000 Encounter for general adult medical examination without abnormal findings: Secondary | ICD-10-CM

## 2012-04-11 DIAGNOSIS — R05 Cough: Secondary | ICD-10-CM

## 2012-04-11 DIAGNOSIS — E119 Type 2 diabetes mellitus without complications: Secondary | ICD-10-CM

## 2012-04-11 DIAGNOSIS — K5900 Constipation, unspecified: Secondary | ICD-10-CM

## 2012-04-11 LAB — POCT GLYCOSYLATED HEMOGLOBIN (HGB A1C): Hemoglobin A1C: 6.8

## 2012-04-11 LAB — GLUCOSE, CAPILLARY: Glucose-Capillary: 212 mg/dL — ABNORMAL HIGH (ref 70–99)

## 2012-04-11 MED ORDER — INSULIN GLARGINE 100 UNIT/ML ~~LOC~~ SOLN: 20.0000 [IU] | Freq: Every day | SUBCUTANEOUS | Status: DC

## 2012-04-11 MED ORDER — SENNA-DOCUSATE SODIUM 8.6-50 MG PO TABS: 1.0000 | ORAL_TABLET | Freq: Every day | ORAL | Status: DC

## 2012-04-11 MED ORDER — PSEUDOEPHEDRINE-CODEINE-GG 30-10-100 MG/5ML PO SOLN: 5.0000 mL | Freq: Four times a day (QID) | ORAL | Status: DC | PRN

## 2012-04-11 NOTE — Assessment & Plan Note (Addendum)
Lab Results  Component Value Date   HGBA1C 6.8 04/11/2012   HGBA1C 12.5 12/21/2011   HGBA1C 6.5 09/03/2011     Assessment:  Diabetes control: fair control  Progress toward A1C goal:  at goal  Comments: Patient's A1c improved from a previous of 12.8 on 12/21/11 to 6.8 today.  Plan:  Medications: Decreased her Lantus dosage from 25 units daily to 20 units daily given her episode of hypoglycemia  Home glucose monitoring:   Frequency:  once a day   Timing:   in the AM  Instruction/counseling given: reminded to bring blood glucose meter & log to each visit  Educational resources provided:   yes.   Self management tools provided:   yes. Patient was encouraged to do more exercise and eat balanced food.  Other plans:

## 2012-04-11 NOTE — Assessment & Plan Note (Signed)
Her constipation is most likely caused by medication effects from ferrous sulfate. Patient has been taking Colace with little help. Will add senokot today and follow up.

## 2012-04-11 NOTE — Assessment & Plan Note (Addendum)
Patient has chronic cough, which is mostly dry cough. Her cough was thought to be due to ACE inhibitor previously. After her lisinopril was discontinued, her cough improved temporarily. Recently her dry cough relapsed. She denies any chest pain, shortness of breath, palpitation, fever or chills. She reports having lost 3 pounds of body weight in previous month, which she attributes to poor sleep secondary to coughing. The etiology for her cough is not clear. Patient she does not have hemoptysis, and no history of smoking. Patient has history of GERD which has been controlled with a Protonix 40 mg daily. Currently patient denies any symptoms of acid reflex, such as heartburn. Her previous chest x-ray from September did not show any masses. I discussed with the Dr. Dalphine Handing who suggested to treat patient symptomatically with a cough medication and albuterol inhaler for 2 weeks. If her cough does not improve, may consider to do an imaging test (CT-chest) in nest visit.

## 2012-04-11 NOTE — Patient Instructions (Addendum)
General Instructions:  1. Please decrease your Lantus dosage from 25 units to 20 units daily from now.  2. Please take all medications as prescribed.  3. If you have worsening of your symptoms or new symptoms arise, please call the clinic ([phone]), or go to the ER immediately if symptoms are severe.  You have done great job in taking all your medications. I appreciate it very much. Please continue doing that.    Treatment Goals:  Goals (1 Years of Data) as of 04/11/2012          As of Today 03/07/12 02/01/12 01/29/12 01/29/12     Blood Pressure    . Blood Pressure < 140/80  97/57 101/58 114/63 152/88 140/70     Result Component    . HEMOGLOBIN A1C < 8  6.8        . LDL CALC < 100            Progress Toward Treatment Goals:  Treatment Goal 04/11/2012  Hemoglobin A1C at goal  Blood pressure at goal  Other  improved    Self Care Goals & Plans:       Care Management & Community Referrals:  Referral 04/11/2012  Referrals made for care management support other (see comment)

## 2012-04-11 NOTE — Progress Notes (Signed)
Patient ID: Bianca [name], female   DOB: [birth year], 78 y.o.   MRN: [id number]  Subjective:   Patient ID: Bianca [name] female   DOB: 04/19/1931 78 y.o.   MRN: [id number]  HPI: [name] is a 78 y.o. with past medical history as outlined below, who presents for an acute visit.   1. Cough: patient reports having cough for more than a month. Initially she had dry cough, then she starts coughing up yellow colored sputum, very little amount (streaks).She feels itchy in her throat. She does not have sore throat, chest pain, SOB, fever or chills. She lost 3 LBs over last month unintentionally. Patient had similar cough before, which was thought be due to ACEI. After discontinuation of her Lisinopril, her cough improved. She did have hx of smoking. Her recent CXR on 01/14/13 did not show any masses. She is taking protonix 40 mg daily. She dose not have heart burning currently.   2. DM-II: She measures her blood sugar proximately once a day. Her CBG has been in a range of 58 to 128 in AM. She had one episode of hypoglycemia with symptoms (blood sugar level not recorded). Her A1c decreased from 12.5 on 12/21/11 to 6.8 today.   3. Constipation: She has one to two BM each week with hard stool. She is currently taking colace without significant help.    Past Medical History  Diagnosis Date  . Diabetes mellitus, type 2   . GERD (gastroesophageal reflux disease)   . Hepatitis B   . Hyperlipemia   . Hypertension   . Cervical dysplasia   . Atrophic vaginitis   . Spinal stenosis     s/p decompression  . CAD (coronary artery disease) April 2008    s/p CABG;    cath 8/12:  LM patent, pLAD occluded, OM1 30-40%, pOM2 80% and 60% after the anastomosis, pRCA occluded, S-DX occluded (old), S-OM 2 occluded (new), L-LAD patent, dLAD provided collats to the PDA; Med Rx  rec.; consider PCI to OM2 if fails med Rx  . Hx of hysterectomy    Current Outpatient Prescriptions  Medication Sig Dispense Refill  .  albuterol (VENTOLIN HFA) 108 (90 BASE) MCG/ACT inhaler Inhale 1 puff into the lungs every 6 (six) hours as needed for wheezing.  1 Inhaler  6  . amLODipine (NORVASC) 5 MG tablet Take 1 tablet (5 mg total) by mouth daily.  30 tablet  11  . aspirin EC 81 MG tablet Take 1 tablet (81 mg total) by mouth daily.  150 tablet  2  . calcitRIOL (ROCALTROL) 0.25 MCG capsule Take 1 capsule (0.25 mcg total) by mouth daily.  60 capsule  1  . carvedilol (COREG) 25 MG tablet Take 1 tablet (25 mg total) by mouth 2 (two) times daily with a meal.  90 tablet  5  . docusate sodium (COLACE) 100 MG capsule Take 1 capsule (100 mg total) by mouth daily as needed for constipation.  30 capsule  3  . ferrous sulfate 325 (65 FE) MG tablet Take 1 tablet (325 mg total) by mouth 3 (three) times daily with meals.  60 tablet  1  . folic acid (FOLVITE) 1 MG tablet Take 1 tablet (1 mg total) by mouth daily.  100 tablet  3  . hydrALAZINE (APRESOLINE) 25 MG tablet Take 1 tablet (25 mg total) by mouth 3 (three) times daily.  270 tablet  5  . insulin aspart (NOVOLOG) 100 UNIT/ML injection Inject 8-10 Units into  the skin 3 (three) times daily before meals. Take 8 units in the morning and before lunch, take 10 units before dinner.      . insulin glargine (LANTUS) 100 UNIT/ML injection Inject 25 Units into the skin at bedtime.      . isosorbide mononitrate (IMDUR) 30 MG 24 hr tablet Take 30 mg by mouth daily.      . nitroGLYCERIN (NITROSTAT) 0.4 MG SL tablet Place 0.4 mg under the tongue every 5 (five) minutes as needed. For chest pain. Maximum of 3 doses      . pantoprazole (PROTONIX) 20 MG tablet Take 2 tablets (40 mg total) by mouth daily.  30 tablet  5  . pantoprazole (PROTONIX) 20 MG tablet Take 2 tablets (40 mg total) by mouth daily.  14 tablet  0  . rosuvastatin (CRESTOR) 20 MG tablet Take 1 tablet (20 mg total) by mouth daily.  90 tablet  8   No family history on file. History   Social History  . Marital Status: Widowed    Spouse  Name: N/A    Number of Children: N/A  . Years of Education: N/A   Social History Main Topics  . Smoking status: Never Smoker   . Smokeless tobacco: None  . Alcohol Use: No  . Drug Use: No  . Sexually Active: None   Other Topics Concern  . None   Social History Narrative   Chinese immigrantHousewifeWidowedLives w/ her son    Review of Systems: General: no fevers, chills, no changes in body weight, no changes in appetite Skin: no rash HEENT: no blurry vision, hearing changes or sore throat Pulm: has chronic cough. No dyspnea and wheezing CV: no chest pain, or SOB Abd: no nausea/vomiting, abdominal pain, diarrhea/constipation GU: No hematuria or polyuria Ext: No leg edema Back: has back pain Neuro: no weakness, numbness, or tingling   Objective:  Physical Exam: General: resting in bed, not in acute distress HEENT: PERRL, EOMI, no scleral icterus Cardiac: S1/S2, RRR, No murmurs, gallops or rubs Pulm: Good air movement bilaterally, Clear to auscultation bilaterally, No rales, wheezing, rhonchi or rubs. Abd: Soft,  nondistended, nontender, no rebound pain, no organomegaly, BS present. There is CVA tenderness. Ext: No rashes,  2+DP/PT pulse bilaterally. No leg edema. Musculoskeletal: there is tenderness over her lower back at midline. ROM normal.  Skin: no rashes. No skin bruise. Neuro: alert and oriented X3, cranial nerves II-XII grossly intact, muscle strength 5/5 in all extremeties, sensation to light touch intact.    Assessment & Plan:

## 2012-04-11 NOTE — Assessment & Plan Note (Signed)
-  did foot exam today. -She refused colonoscopy in the past, FOBT card was negative for 3 times. We'll postpone colonoscopy.

## 2012-05-04 ENCOUNTER — Ambulatory Visit: Payer: Medicare Other | Admitting: Internal Medicine

## 2012-05-10 ENCOUNTER — Ambulatory Visit (INDEPENDENT_AMBULATORY_CARE_PROVIDER_SITE_OTHER): Payer: Medicare Other | Admitting: Internal Medicine

## 2012-05-10 ENCOUNTER — Encounter: Payer: Self-pay | Admitting: Internal Medicine

## 2012-05-10 VITALS — BP 124/68 | HR 79 | Temp 97.7°F | Ht 60.0 in | Wt 110.2 lb

## 2012-05-10 DIAGNOSIS — R05 Cough: Secondary | ICD-10-CM

## 2012-05-10 DIAGNOSIS — I10 Essential (primary) hypertension: Secondary | ICD-10-CM

## 2012-05-10 DIAGNOSIS — E119 Type 2 diabetes mellitus without complications: Secondary | ICD-10-CM

## 2012-05-10 DIAGNOSIS — N2581 Secondary hyperparathyroidism of renal origin: Secondary | ICD-10-CM

## 2012-05-10 DIAGNOSIS — D509 Iron deficiency anemia, unspecified: Secondary | ICD-10-CM

## 2012-05-10 MED ORDER — GUAIFENESIN-CODEINE 100-10 MG/5ML PO SYRP: 5.0000 mL | ORAL_SOLUTION | Freq: Three times a day (TID) | ORAL | Status: DC | PRN

## 2012-05-10 MED ORDER — ISOSORBIDE MONONITRATE ER 30 MG PO TB24: 30.0000 mg | ORAL_TABLET | Freq: Every day | ORAL | Status: DC

## 2012-05-10 MED ORDER — FERROUS SULFATE 325 (65 FE) MG PO TABS: 325.0000 mg | ORAL_TABLET | Freq: Three times a day (TID) | ORAL | Status: DC

## 2012-05-10 MED ORDER — CALCITRIOL 0.25 MCG PO CAPS: 0.2500 ug | ORAL_CAPSULE | Freq: Every day | ORAL | Status: DC

## 2012-05-10 MED ORDER — PSEUDOEPHEDRINE-CODEINE-GG 30-10-100 MG/5ML PO SOLN: 5.0000 mL | Freq: Four times a day (QID) | ORAL | Status: DC | PRN

## 2012-05-10 MED ORDER — AMLODIPINE BESYLATE 5 MG PO TABS: 5.0000 mg | ORAL_TABLET | Freq: Every day | ORAL | Status: DC

## 2012-05-10 NOTE — Patient Instructions (Signed)
1. You have done great job in taking all your medications. I appreciate it very much. Please continue doing that. 2.  If your cough get worse, please come back. Please stop taking lisinopril which may have caused your cough. 3. If you have worsening of your symptoms or new symptoms arise, please call the clinic ([phone]), or go to the ER immediately if symptoms are severe.

## 2012-05-10 NOTE — Assessment & Plan Note (Signed)
Her cough improved recently. Patient has chronic dry. Her cough was thought to be due to ACE inhibitor previously. After her lisinopril was discontinued, her cough improved, but relapsed. Detailed questioning revealed that patient is still taking her lisinopril by mistake. She denies any chest pain, shortness of breath, palpitation, fever or chills. Patient she does not have hemoptysis, and no history of smoking. Her previous chest x-ray from September did not show any masses. Her symptoms are improving and her cough is still possibly caused by lisinopril since she is still taking it by mistake. Other possibilities include, but less likely GERD which has been controlled with a Protonix 40 mg daily. Currently patient denies any symptoms of acid reflex, such as heartburn. Patient had a proximately 5 pounds of body weight loss in past 2 months, which she attributes to poor sleep secondary to coughing. Lung cancer or interstitial lung disease are on the differential list. Since her cough is improving, we will not do CT chest at this moment.  -will continue to treat her symptomatically with Robitussin. -Patient is instructed again to stop taking lisinopril from now -will follow up closely. If it persisted after stopping taking lisinopril, will consider CT chest.

## 2012-05-10 NOTE — Progress Notes (Signed)
Patient ID: Bianca [name], female   DOB: 25-Jul-1930, 79 y.o.   MRN: [id number] Subjective:   Patient ID: Bianca [name] female   DOB: 10/04/1930 79 y.o.   MRN: [id number]  CC:  month follow up visit and cough.  HPI:  [name] is a 79 y.o. lady  with past medical history as outlined below, who presents for a followup visit.              1. Cough: Her cough improved recently. Patient has been having cough for nearly 2 months. Initially she had dry cough, then she started coughing up yellow colored sputum, very little amount (streaks). She did not have sore throat, chest pain, SOB, fever or chills. She lost 5 LBs over last two months unintentionally. It was thought be due to ACEI. After discontinuation of her Lisinopril, her cough improved. But recently, she is taking her lisinopril pill again due to mistake. She did not have hx of smoking. Her recent CXR on 01/14/13 did not show any masses. She is taking protonix 40 mg daily. She dose not have heart burning currently.   2. DM-II: She measures her blood sugar proximately once a day. Her A1c decreased from 12.5 on 12/21/11 to 6.8 on 04/11/12.  She did no have symptoms hypo- or hyperglycemia.  3. Constipation: improved. She has two BM each week recently. She is currently taking colace and Senokot currently.  4. HTN: it is well controlled. Today her bp is 124/68 mmHg. She does not have chest pain, shortness of breath, palpitation or leg edema.   Past Medical History  Diagnosis Date  . Diabetes mellitus, type 2   . GERD (gastroesophageal reflux disease)   . Hepatitis B   . Hyperlipemia   . Hypertension   . Cervical dysplasia   . Atrophic vaginitis   . Spinal stenosis     s/p decompression  . CAD (coronary artery disease) April 2008    s/p CABG;    cath 8/12:  LM patent, pLAD occluded, OM1 30-40%, pOM2 80% and 60% after the anastomosis, pRCA occluded, S-DX occluded (old), S-OM 2 occluded (new), L-LAD patent, dLAD provided collats to the PDA;  Med Rx  rec.; consider PCI to OM2 if fails med Rx  . Hx of hysterectomy    Current Outpatient Prescriptions  Medication Sig Dispense Refill  . albuterol (VENTOLIN HFA) 108 (90 BASE) MCG/ACT inhaler Inhale 1 puff into the lungs every 6 (six) hours as needed for wheezing.  1 Inhaler  6  . amLODipine (NORVASC) 5 MG tablet Take 1 tablet (5 mg total) by mouth daily.  30 tablet  11  . aspirin EC 81 MG tablet Take 1 tablet (81 mg total) by mouth daily.  150 tablet  2  . calcitRIOL (ROCALTROL) 0.25 MCG capsule Take 1 capsule (0.25 mcg total) by mouth daily.  60 capsule  5  . carvedilol (COREG) 25 MG tablet Take 1 tablet (25 mg total) by mouth 2 (two) times daily with a meal.  90 tablet  5  . docusate sodium (COLACE) 100 MG capsule Take 1 capsule (100 mg total) by mouth daily as needed for constipation.  30 capsule  3  . ferrous sulfate 325 (65 FE) MG tablet Take 1 tablet (325 mg total) by mouth 3 (three) times daily with meals.  60 tablet  5  . folic acid (FOLVITE) 1 MG tablet Take 1 tablet (1 mg total) by mouth daily.  100 tablet  3  .  hydrALAZINE (APRESOLINE) 25 MG tablet Take 1 tablet (25 mg total) by mouth 3 (three) times daily.  270 tablet  5  . insulin aspart (NOVOLOG) 100 UNIT/ML injection Inject 8-10 Units into the skin 3 (three) times daily before meals. Take 8 units in the morning and before lunch, take 10 units before dinner.      . insulin glargine (LANTUS) 100 UNIT/ML injection Inject 20 Units into the skin at bedtime.  10 mL  0  . isosorbide mononitrate (IMDUR) 30 MG 24 hr tablet Take 1 tablet (30 mg total) by mouth daily.  30 tablet  5  . nitroGLYCERIN (NITROSTAT) 0.4 MG SL tablet Place 0.4 mg under the tongue every 5 (five) minutes as needed. For chest pain. Maximum of 3 doses      . pantoprazole (PROTONIX) 20 MG tablet Take 2 tablets (40 mg total) by mouth daily.  14 tablet  0  . pseudoephedrine-codeine-guaifenesin (CHERATUSSIN DAC) 30-10-100 MG/5ML solution Take 5 mLs by mouth 4 (four)  times daily as needed for cough.  120 mL  2  . rosuvastatin (CRESTOR) 20 MG tablet Take 1 tablet (20 mg total) by mouth daily.  90 tablet  8  . sennosides-docusate sodium (SENOKOT-S) 8.6-50 MG tablet Take 1 tablet by mouth daily.  90 tablet  3   No family history on file. History   Social History  . Marital Status: Widowed    Spouse Name: N/A    Number of Children: N/A  . Years of Education: N/A   Social History Main Topics  . Smoking status: Never Smoker   . Smokeless tobacco: None  . Alcohol Use: No  . Drug Use: No  . Sexually Active: None   Other Topics Concern  . None   Social History Narrative   Chinese immigrantHousewifeWidowedLives w/ her son    Review of Systems: General: no fevers, chills, no changes in body weight, no changes in appetite Skin: no rash HEENT: no blurry vision, hearing changes or sore throat Pulm: has chronic cough. No dyspnea and wheezing CV: no chest pain, or SOB Abd: no nausea/vomiting, abdominal pain, diarrhea/constipation GU: No hematuria or polyuria Ext: No leg edema Back: has back pain Neuro: no weakness, numbness, or tingling  Objective:  Physical Exam: Filed Vitals:   05/10/12 1318  BP: 124/68  Pulse: 79  Temp: 97.7 F (36.5 C)  TempSrc: Oral  Height: 5' (1.524 m)  Weight: 110 lb 3.2 oz (49.986 kg)  SpO2: 99%   Physical Exam: General: resting in bed, not in acute distress HEENT: PERRL, EOMI, no scleral icterus Cardiac: S1/S2, RRR, No murmurs, gallops or rubs Pulm: Good air movement bilaterally, Clear to auscultation bilaterally, No rales, wheezing, rhonchi or rubs. Abd: Soft,  nondistended, nontender, no rebound pain, no organomegaly, BS present. There is CVA tenderness. Ext: No rashes,  2+DP/PT pulse bilaterally. No leg edema. Musculoskeletal: there is tenderness over her lower back at midline. ROM normal.  Skin: no rashes. No skin bruise. Neuro: alert and oriented X3, cranial nerves II-XII grossly intact, muscle strength  5/5 in all extremeties, sensation to light touch intact.      Assessment & Plan:

## 2012-05-10 NOTE — Assessment & Plan Note (Signed)
Lab Results  Component Value Date   HGBA1C 6.8 04/11/2012   HGBA1C 12.5 12/21/2011   HGBA1C 6.5 09/03/2011     Assessment:  Diabetes control: good control (HgbA1C at goal)  Progress toward A1C goal:  at goal  Comments:   Plan:  Medications:  continue current medications  Home glucose monitoring:   Frequency:  once a day   Timing:  AM  Instruction/counseling given: reminded to bring blood glucose meter & log to each visit and reminded to bring medications to each visit  Educational resources provided: brochure  Self management tools provided:     Other plans: Her A1c is at goal. Patient did not have symptoms of hypo-or hyperglycemia. Will continue current regimen.

## 2012-05-10 NOTE — Assessment & Plan Note (Signed)
BP Readings from Last 3 Encounters:  05/10/12 124/68  04/11/12 97/57  03/07/12 101/58    Lab Results  Component Value Date   NA 141 01/29/2012   K 3.8 01/29/2012   CREATININE 1.30* 01/29/2012    Assessment:  Blood pressure control: controlled  Progress toward BP goal:  at goal  Comments:   Plan:  Medications:  continue current medications  Educational resources provided: brochure  Self management tools provided:   brochure  Other plans: Blood pressure is well controlled. Today blood pressure is normal. Will continue current regimen.

## 2012-06-06 ENCOUNTER — Other Ambulatory Visit: Payer: Self-pay | Admitting: Internal Medicine

## 2012-06-06 ENCOUNTER — Other Ambulatory Visit: Payer: Self-pay | Admitting: Ophthalmology

## 2012-07-11 ENCOUNTER — Encounter: Payer: Medicare Other | Admitting: Internal Medicine

## 2012-07-24 ENCOUNTER — Other Ambulatory Visit: Payer: Self-pay | Admitting: *Deleted

## 2012-07-24 ENCOUNTER — Other Ambulatory Visit: Payer: Self-pay | Admitting: Internal Medicine

## 2012-07-24 DIAGNOSIS — R05 Cough: Secondary | ICD-10-CM

## 2012-07-24 MED ORDER — ALBUTEROL SULFATE HFA 108 (90 BASE) MCG/ACT IN AERS: 1.0000 | INHALATION_SPRAY | Freq: Four times a day (QID) | RESPIRATORY_TRACT | Status: DC | PRN

## 2012-07-24 MED ORDER — CARVEDILOL 25 MG PO TABS: ORAL_TABLET | ORAL | Status: DC

## 2012-07-24 MED ORDER — FAMOTIDINE 20 MG PO TABS: ORAL_TABLET | ORAL | Status: DC

## 2012-08-28 ENCOUNTER — Other Ambulatory Visit: Payer: Self-pay | Admitting: Internal Medicine

## 2012-08-28 DIAGNOSIS — K219 Gastro-esophageal reflux disease without esophagitis: Secondary | ICD-10-CM

## 2012-09-07 ENCOUNTER — Telehealth: Payer: Self-pay | Admitting: *Deleted

## 2012-09-07 NOTE — Telephone Encounter (Signed)
lmptcb due to see Dr. Hochrein for 1 yr f/u, ptalso did not have her repeat bmet last yr as ordered. please schedule pt for 1 yr f/u DR. Hochrein July schedule has openings 

## 2012-09-07 NOTE — Telephone Encounter (Signed)
lmptcb due to see Dr. Antoine Poche for 1 yr f/u, ptalso did not have her repeat bmet last yr as ordered. please schedule pt for 1 yr f/u DR. Hochrein July schedule has openings

## 2012-09-11 ENCOUNTER — Other Ambulatory Visit: Payer: Self-pay | Admitting: Internal Medicine

## 2012-09-11 DIAGNOSIS — E119 Type 2 diabetes mellitus without complications: Secondary | ICD-10-CM

## 2012-09-11 DIAGNOSIS — I2510 Atherosclerotic heart disease of native coronary artery without angina pectoris: Secondary | ICD-10-CM

## 2012-09-11 MED ORDER — INSULIN GLARGINE 100 UNIT/ML ~~LOC~~ SOLN: 20.0000 [IU] | Freq: Every day | SUBCUTANEOUS | Status: DC

## 2012-09-12 ENCOUNTER — Telehealth: Payer: Self-pay | Admitting: *Deleted

## 2012-09-12 NOTE — Telephone Encounter (Signed)
lmcb

## 2012-09-12 NOTE — Telephone Encounter (Signed)
Message copied by Debbe Bales on Tue Sep 12, 2012  2:53 PM ------      Message from: Tarri Fuller      Created: Thu Sep 07, 2012  6:21 PM       lmptcb due to see Dr. Antoine Poche for 1 yr f/u, ptalso did not have her repeat bmet last yr as ordered. please schedule pt for 1 yr f/u DR. Hochrein July schedule has openings      ----- Message -----         From: SYSTEM         Sent: 09/06/2012  12:02 AM           To: Beatrice Lecher, PA-C                   ------

## 2012-09-26 ENCOUNTER — Other Ambulatory Visit: Payer: Self-pay | Admitting: Internal Medicine

## 2012-10-30 ENCOUNTER — Other Ambulatory Visit: Payer: Self-pay | Admitting: Internal Medicine

## 2012-10-30 DIAGNOSIS — K219 Gastro-esophageal reflux disease without esophagitis: Secondary | ICD-10-CM

## 2012-11-09 ENCOUNTER — Other Ambulatory Visit: Payer: Self-pay

## 2012-11-20 ENCOUNTER — Other Ambulatory Visit: Payer: Self-pay | Admitting: Ophthalmology

## 2012-11-20 DIAGNOSIS — E785 Hyperlipidemia, unspecified: Secondary | ICD-10-CM

## 2012-12-13 ENCOUNTER — Encounter: Payer: Self-pay | Admitting: *Deleted

## 2012-12-14 ENCOUNTER — Encounter: Payer: Self-pay | Admitting: *Deleted

## 2012-12-22 ENCOUNTER — Other Ambulatory Visit: Payer: Self-pay | Admitting: Internal Medicine

## 2012-12-22 DIAGNOSIS — E119 Type 2 diabetes mellitus without complications: Secondary | ICD-10-CM

## 2013-01-03 ENCOUNTER — Other Ambulatory Visit: Payer: Self-pay | Admitting: Internal Medicine

## 2013-01-03 DIAGNOSIS — I2510 Atherosclerotic heart disease of native coronary artery without angina pectoris: Secondary | ICD-10-CM

## 2013-01-12 ENCOUNTER — Other Ambulatory Visit: Payer: Self-pay | Admitting: Internal Medicine

## 2013-01-12 DIAGNOSIS — D649 Anemia, unspecified: Secondary | ICD-10-CM

## 2013-01-12 DIAGNOSIS — R079 Chest pain, unspecified: Secondary | ICD-10-CM

## 2013-03-09 ENCOUNTER — Other Ambulatory Visit: Payer: Self-pay | Admitting: Internal Medicine

## 2013-03-09 DIAGNOSIS — E119 Type 2 diabetes mellitus without complications: Secondary | ICD-10-CM

## 2013-04-16 ENCOUNTER — Other Ambulatory Visit: Payer: Self-pay | Admitting: Internal Medicine

## 2013-04-16 DIAGNOSIS — E119 Type 2 diabetes mellitus without complications: Secondary | ICD-10-CM

## 2013-05-30 ENCOUNTER — Encounter: Payer: Self-pay | Admitting: Internal Medicine

## 2013-06-11 ENCOUNTER — Ambulatory Visit (INDEPENDENT_AMBULATORY_CARE_PROVIDER_SITE_OTHER): Payer: Medicare Other | Admitting: Internal Medicine

## 2013-06-11 ENCOUNTER — Ambulatory Visit (HOSPITAL_COMMUNITY)
Admission: RE | Admit: 2013-06-11 | Discharge: 2013-06-11 | Disposition: A | Discharge status: Home / Self Care | Payer: Medicare Other | Source: Ambulatory Visit | Attending: Internal Medicine | Admitting: Internal Medicine

## 2013-06-11 ENCOUNTER — Encounter: Payer: Self-pay | Admitting: Internal Medicine

## 2013-06-11 VITALS — BP 183/85 | HR 66 | Temp 97.4°F | Ht 60.0 in | Wt 110.4 lb

## 2013-06-11 DIAGNOSIS — S99922A Unspecified injury of left foot, initial encounter: Secondary | ICD-10-CM | POA: Insufficient documentation

## 2013-06-11 DIAGNOSIS — K219 Gastro-esophageal reflux disease without esophagitis: Secondary | ICD-10-CM

## 2013-06-11 DIAGNOSIS — W19XXXA Unspecified fall, initial encounter: Secondary | ICD-10-CM | POA: Insufficient documentation

## 2013-06-11 DIAGNOSIS — E785 Hyperlipidemia, unspecified: Secondary | ICD-10-CM

## 2013-06-11 DIAGNOSIS — S99929A Unspecified injury of unspecified foot, initial encounter: Secondary | ICD-10-CM

## 2013-06-11 DIAGNOSIS — I129 Hypertensive chronic kidney disease with stage 1 through stage 4 chronic kidney disease, or unspecified chronic kidney disease: Secondary | ICD-10-CM

## 2013-06-11 DIAGNOSIS — I10 Essential (primary) hypertension: Secondary | ICD-10-CM

## 2013-06-11 DIAGNOSIS — M25579 Pain in unspecified ankle and joints of unspecified foot: Secondary | ICD-10-CM

## 2013-06-11 DIAGNOSIS — E119 Type 2 diabetes mellitus without complications: Secondary | ICD-10-CM

## 2013-06-11 DIAGNOSIS — Z0000 Encounter for general adult medical examination without abnormal findings: Secondary | ICD-10-CM

## 2013-06-11 DIAGNOSIS — S92919A Unspecified fracture of unspecified toe(s), initial encounter for closed fracture: Secondary | ICD-10-CM | POA: Insufficient documentation

## 2013-06-11 DIAGNOSIS — D649 Anemia, unspecified: Secondary | ICD-10-CM

## 2013-06-11 DIAGNOSIS — N183 Chronic kidney disease, stage 3 unspecified: Secondary | ICD-10-CM

## 2013-06-11 DIAGNOSIS — S8990XA Unspecified injury of unspecified lower leg, initial encounter: Secondary | ICD-10-CM

## 2013-06-11 DIAGNOSIS — R0602 Shortness of breath: Secondary | ICD-10-CM

## 2013-06-11 DIAGNOSIS — S99919A Unspecified injury of unspecified ankle, initial encounter: Secondary | ICD-10-CM

## 2013-06-11 LAB — COMPLETE METABOLIC PANEL WITH GFR
ALBUMIN: 4.1 g/dL (ref 3.5–5.2)
ALT: 13 U/L (ref 0–35)
AST: 19 U/L (ref 0–37)
Alkaline Phosphatase: 94 U/L (ref 39–117)
BUN: 25 mg/dL — ABNORMAL HIGH (ref 6–23)
CALCIUM: 9.2 mg/dL (ref 8.4–10.5)
CHLORIDE: 104 meq/L (ref 96–112)
CO2: 26 meq/L (ref 19–32)
CREATININE: 1.67 mg/dL — AB (ref 0.50–1.10)
GFR, EST AFRICAN AMERICAN: 33 mL/min — AB
GFR, Est Non African American: 29 mL/min — ABNORMAL LOW
Glucose, Bld: 255 mg/dL — ABNORMAL HIGH (ref 70–99)
POTASSIUM: 3.9 meq/L (ref 3.5–5.3)
Sodium: 138 mEq/L (ref 135–145)
TOTAL PROTEIN: 6.7 g/dL (ref 6.0–8.3)
Total Bilirubin: 0.6 mg/dL (ref 0.2–1.2)

## 2013-06-11 LAB — GLUCOSE, CAPILLARY
GLUCOSE-CAPILLARY: 252 mg/dL — AB (ref 70–99)
Glucose-Capillary: 230 mg/dL — ABNORMAL HIGH (ref 70–99)

## 2013-06-11 LAB — LIPID PANEL
Cholesterol: 218 mg/dL — ABNORMAL HIGH (ref 0–200)
HDL: 65 mg/dL (ref >39)
LDL CALC: 106 mg/dL — AB (ref 0–99)
Total CHOL/HDL Ratio: 3.4 Ratio
Triglycerides: 235 mg/dL — ABNORMAL HIGH (ref <150)
VLDL: 47 mg/dL — AB (ref 0–40)

## 2013-06-11 LAB — CBC WITH DIFFERENTIAL/PLATELET
BASOS ABS: 0 10*3/uL (ref 0.0–0.1)
Basophils Relative: 0 % (ref 0–1)
EOS ABS: 0.1 10*3/uL (ref 0.0–0.7)
EOS PCT: 2 % (ref 0–5)
HCT: 35.5 % — ABNORMAL LOW (ref 36.0–46.0)
Hemoglobin: 11.7 g/dL — ABNORMAL LOW (ref 12.0–15.0)
LYMPHS ABS: 1.1 10*3/uL (ref 0.7–4.0)
LYMPHS PCT: 21 % (ref 12–46)
MCH: 29.8 pg (ref 26.0–34.0)
MCHC: 33 g/dL (ref 30.0–36.0)
MCV: 90.3 fL (ref 78.0–100.0)
Monocytes Absolute: 0.5 10*3/uL (ref 0.1–1.0)
Monocytes Relative: 10 % (ref 3–12)
NEUTROS PCT: 67 % (ref 43–77)
Neutro Abs: 3.5 10*3/uL (ref 1.7–7.7)
Platelets: 159 10*3/uL (ref 150–400)
RBC: 3.93 MIL/uL (ref 3.87–5.11)
RDW: 14.1 % (ref 11.5–15.5)
WBC: 5.3 10*3/uL (ref 4.0–10.5)

## 2013-06-11 LAB — POCT GLYCOSYLATED HEMOGLOBIN (HGB A1C): Hemoglobin A1C: 9.3

## 2013-06-11 MED ORDER — PANTOPRAZOLE SODIUM 20 MG PO TBEC: 40.0000 mg | DELAYED_RELEASE_TABLET | Freq: Every day | ORAL | Status: DC

## 2013-06-11 MED ORDER — AMLODIPINE BESYLATE 5 MG PO TABS: 5.0000 mg | ORAL_TABLET | Freq: Every day | ORAL | Status: DC

## 2013-06-11 MED ORDER — ACETAMINOPHEN 500 MG PO TABS: 500.0000 mg | ORAL_TABLET | Freq: Four times a day (QID) | ORAL | Status: DC | PRN

## 2013-06-11 MED ORDER — ALBUTEROL SULFATE HFA 108 (90 BASE) MCG/ACT IN AERS: INHALATION_SPRAY | RESPIRATORY_TRACT | Status: DC

## 2013-06-11 MED ORDER — HYDRALAZINE HCL 25 MG PO TABS: 25.0000 mg | ORAL_TABLET | Freq: Three times a day (TID) | ORAL | Status: DC

## 2013-06-11 NOTE — Assessment & Plan Note (Signed)
-  did foot exam, she has good PT/DP pulses -will take retinal picture -will check Micro albumin/Cre ratio -will check Lipid profile -she had negative stool occult blood test X 3, 03/2012

## 2013-06-11 NOTE — Assessment & Plan Note (Signed)
X-ray showed nondisplaced chip fracture base of the proximal phalanx of the left second toe. Currently no signs of infection. PT/DP pulse are normal. The pain is mild to moderate. She is not good candidate for Norcotics due to side effects of sedation or NSIADs due to chronic kidney disease.  -will treat pain with tylenol -give referral to orthopedic surgeon.

## 2013-06-11 NOTE — Assessment & Plan Note (Addendum)
Lab Results  Component Value Date   HGBA1C 9.3 06/11/2013   HGBA1C 6.8 04/11/2012   HGBA1C 12.5 12/21/2011     Assessment: Diabetes control: poor control (HgbA1C >9%) Progress toward A1C goal:  deteriorated Comments:   Plan: Medications:  continue current medications Home glucose monitoring: Frequency:   Timing:   Instruction/counseling given: reminded to bring medications to each visit Educational resources provided: brochure Self management tools provided: copy of home glucose meter download Other plans: A1c 6.8 on 04/11/12--> 9.3 today. She is supposed to take Lantus 20 units and NovoLog 8 times a day before meals, but both patient and her son can not tell the exact inulin dose. She did not have symptoms for hypoglycemia. Will continue current regimen. Patient was instructed to bring in all her medication bottles in next visit. Will make adjustment for her insulin dose in next visit in one month. Will check microalbumin/creatinine ratio.

## 2013-06-11 NOTE — Assessment & Plan Note (Addendum)
Patient has iron deficiency anemia with negative FOBT test. The patient is currently on iron supplement. Hgb was 9.5 on 01/29/12.  -will continue iron supplements - will check anemia panel

## 2013-06-11 NOTE — Assessment & Plan Note (Signed)
BP Readings from Last 3 Encounters:  06/11/13 183/85  05/10/12 124/68  04/11/12 97/57    Lab Results  Component Value Date   NA 141 01/29/2012   K 3.8 01/29/2012   CREATININE 1.30* 01/29/2012    Assessment: Blood pressure control: moderately elevated Progress toward BP goal:  deteriorated Comments:   Plan: Medications:  continue current medications Educational resources provided: brochure Self management tools provided: home blood pressure logbook Other plans: blood pressure is elevated today due to medication noncompliance. She was supposed to take amlodipine 5 mg daily, hydralazine 25 mg 3 times a day and carvedilol 25 mg twice a day. Patient is also on isosorbide monitrite. She is not taking amlodipine and hydralazine. Will restart Amlodipine hydralazine and reevaluate patient in one month. Will check CMP.

## 2013-06-11 NOTE — Assessment & Plan Note (Signed)
She was given referral to [facility], but she did not go yet. Her PTH was 118 on 03/07/12. She is currently on Calcitrol 0.25 mcg daily.   -will check CMP and PTH -patient is instructed to call [facility], tel number was provided.

## 2013-06-11 NOTE — Patient Instructions (Signed)
1. Please call kidney doctor's office using the number I give you. 2. Please take tylenol for your foot pain. 3. Please restart taking amlodipine and hydralazine for your blood pressure as prescribed.  4. If you have worsening of your symptoms or new symptoms arise, please call the clinic [phone]), or go to the ER immediately if symptoms are severe.   Please bring in all your medication bottles with you in next visit.

## 2013-06-11 NOTE — Progress Notes (Addendum)
Patient ID: Donavan BurnetJieh T Pettey, female   DOB: 1930/08/31, 80 y.o.   MRN: [id number] Subjective:   Patient ID: Donavan BurnetJieh T Kadel female   DOB: 1930/08/31 80 y.o.   MRN: [id number]  CC:   Follow up visit.    HPI:  Ms.Maniya T Cherly HensenChang is a 80 y.o. lady with past medical history as outlined below, who presents for a followup visit today  1. DM-II: Her last A1c 6.8 on 04/11/12--> 9.3 today. She is supposed to take Lantus 20 units and NovoLog 8 times a day before meals, but both patient and her son can not tell the exact inulin dose. She did not have symptoms for hypoglycemia.   2. HTN: She was supposed to take amlodipine 5 mg daily, hydralazine 25 mg 3 times a day and carvedilol 25 mg twice a day. Patient is also on isosorbide monitrite. She is not taking amlodipine and hydralazine, not sure how long she has been not taking these medications. Today her blood pressure is 183/85 mmHg. Patient does not have chest pain, shortness of breath, or palpitation. There is no pain at the calf areas.  3. CKD-III: Her PTH was 118 on 03/07/12. She was given referral to renal before, but she did not go yet.   4. Left foot injury: The patient had a mechanic fall one month ago. She injured her left foot. She had pain in left foot, but did not seek treatment .Today, she has pain over the left great toe and ankle with minimal edema. No fever or chills.   5. Iron deficiency anemia: The patient is currently on iron supplement. Hgb was 9.5 on 01/29/12.   6. HLD: last LDL was 54 on 09/02/13. Currently on crestor 20 mg daily.she does not have side effects, such as a muscle pain. Her AST and ALT were normal on 01/29/12.   ROS:  Denies fever, chills, fatigue, headaches, cough,  chest pain, abdominal pain, diarrhea, constipation, dysuria, urgency, frequency, hematuria or leg swelling.  Contact number: [phone] for younger son Weston Brass(Nick), [phone] for older son Selena Batten([name]) and [phone] for her daughter Byrd Hesselbach(Maria)     Past Medical History   Diagnosis Date  . Diabetes mellitus, type 2   . GERD (gastroesophageal reflux disease)   . Hepatitis B   . Hyperlipemia   . Hypertension   . Cervical dysplasia   . Atrophic vaginitis   . Spinal stenosis     s/p decompression  . CAD (coronary artery disease) April 2008    s/p CABG;    cath 8/12:  LM patent, pLAD occluded, OM1 30-40%, pOM2 80% and 60% after the anastomosis, pRCA occluded, S-DX occluded (old), S-OM 2 occluded (new), L-LAD patent, dLAD provided collats to the PDA; Med Rx  rec.; consider PCI to OM2 if fails med Rx  . Hx of hysterectomy    Current Outpatient Prescriptions  Medication Sig Dispense Refill  . amLODipine (NORVASC) 5 MG tablet Take 1 tablet (5 mg total) by mouth daily.  30 tablet  11  . aspirin EC 81 MG tablet TAKE 1 TABLET BY MOUTH EVERY DAY  150 tablet  1  . B-D ULTRAFINE III SHORT PEN 31G X 8 MM MISC USE AS DIRECTED TO INJECT INSULIN 3 TIMES A DAY  100 each  11  . BAYER CONTOUR NEXT TEST test strip TEST BLOOD SUGAR UP TO 5 TIMES A DAY  100 each  11  . calcitRIOL (ROCALTROL) 0.25 MCG capsule Take 1 capsule (0.25 mcg total) by mouth daily.  60 capsule  5  . carvedilol (COREG) 25 MG tablet TAKE 1 TABLET (25 MG TOTAL) BY MOUTH 2 (TWO) TIMES DAILY WITH A MEAL.  120 tablet  3  . CRESTOR 20 MG tablet TAKE 1 TABLET (20 MG TOTAL) BY MOUTH DAILY.  90 tablet  3  . famotidine (PEPCID) 20 MG tablet TAKE 1 TABLET BY MOUTH TWICE A DAY  60 tablet  3  . ferrous sulfate 325 (65 FE) MG tablet TAKE 1 TABLET BY MOUTH 3 TIMES DAILY WITH MEALS.  90 tablet  5  . hydrALAZINE (APRESOLINE) 25 MG tablet Take 1 tablet (25 mg total) by mouth 3 (three) times daily.  270 tablet  5  . insulin glargine (LANTUS) 100 UNIT/ML injection Inject 0.2 mLs (20 Units total) into the skin at bedtime.  10 mL  5  . isosorbide mononitrate (IMDUR) 30 MG 24 hr tablet TAKE 1 TABLET (30 MG TOTAL) BY MOUTH DAILY.  90 tablet  3  . nitroGLYCERIN (NITROSTAT) 0.4 MG SL tablet Place 0.4 mg under the tongue every 5  (five) minutes as needed. For chest pain. Maximum of 3 doses      . NOVOLOG FLEXPEN 100 UNIT/ML SOPN FlexPen INJECT 8 UNITS INTO THE SKIN THREE TIMES DAILY BEFORE MEALS.  3 pen  11  . pantoprazole (PROTONIX) 20 MG tablet Take 2 tablets (40 mg total) by mouth daily.  14 tablet  0  . PROAIR HFA 108 (90 BASE) MCG/ACT inhaler INHALE 1 PUFF INTO LUNGS EVERY 6 HOURS AS NEEDED FOR WHEEZE  8.5 each  0  . pseudoephedrine-codeine-guaifenesin (CHERATUSSIN DAC) 30-10-100 MG/5ML solution Take 5 mLs by mouth 4 (four) times daily as needed for cough.  120 mL  2  . sennosides-docusate sodium (SENOKOT-S) 8.6-50 MG tablet Take 1 tablet by mouth daily.  90 tablet  3   No current facility-administered medications for this visit.   No family history on file. History   Social History  . Marital Status: Widowed    Spouse Name: N/A    Number of Children: N/A  . Years of Education: N/A   Social History Main Topics  . Smoking status: Never Smoker   . Smokeless tobacco: None  . Alcohol Use: No  . Drug Use: No  . Sexual Activity: None   Other Topics Concern  . None   Social History Narrative   Chinese immigrant   Housewife   Widowed   Lives w/ her son    Review of Systems: Full 14-point review of systems otherwise negative. See HPI.   Objective:  Physical Exam: Filed Vitals:   06/11/13 1539  BP: 183/85  Pulse: 66  Temp: 97.4 F (36.3 C)  TempSrc: Oral  Height: 5' (1.524 m)  Weight: 110 lb 6.4 oz (50.077 kg)  SpO2: 99%   Constitutional: Vital signs reviewed.  Patient is a well-developed and well-nourished, in no acute distress and cooperative with exam.   HEENT:  Head: Normocephalic and atraumatic Mouth: no erythema or exudates, MMM Eyes: PERRL, EOMI, conjunctivae normal, No scleral icterus.  Neck: Supple, Trachea midline normal ROM, No JVD  Cardiovascular: RRR, S1 normal, S2 normal, no MRG, pulses symmetric and intact bilaterally Pulmonary/Chest: CTAB, no wheezes, rales, or  rhonchi Abdominal: Soft. Non-tender, non-distended, bowel sounds are normal, no masses, organomegaly, or guarding present.  GU: no CVA tenderness Musculoskeletal: No joint deformities, erythema, or stiffness, ROM full and non-tender Extremities: there is tenderness over the base of left great toe and ankle. There is minimal  edema over the back of left foot.  Hematology: no cervical, inginal, or axillary adenopathy.  Neurological: A&O x3, Strength is normal and symmetric bilaterally, cranial nerve II-XII are grossly intact, no focal motor deficit, sensory intact to light touch bilaterally.  Skin: Warm, dry and intact. No rash, cyanosis, or clubbing.  Psychiatric: Normal mood and affect. No suicidal or homicidal ideation.  Assessment & Plan:   Addendum 06/12/13:  Patient's microalbumin/cre ratio has worsened significantly, GFR and Cre level are stable. She was given referral to renal already. Her LDL is 106 which is near the goal of 100, will continue current statin regimen. Her Hgb is up from 9.5 on 01/29/12 to 11.7 on 06/11/13 after started iron supplement since 03/10/12. Anemia panel showed iron deficiency is normalized. According to [employer], there is disagreement as to how long to continue oral iron therapy. "some physicians stop treatment with iron when the hemoglobin level becomes normal, so that further blood loss will cause anemia and alert the patient and physician to the return of the problem which caused the iron deficiency in the first place. Others believe that it is wise to treat for at least six months after the hemoglobin has normalized, in order to replenish iron stores". Since patient has been taken iron supplement since 03/10/12, I think that the time is long enough given her Hemoccult was negative. Her Hgb 11.7 is slightly lower than normal, it may be due to her chronic kidney disease. I will discontinue her iron supplement from now and monitor her CBC in 3 months. I will write a letter to  tell patient to stop taking iron supplement.   Lorretta Harp, MD PGY3, Internal Medicine Teaching Service Pager: [phone]

## 2013-06-12 LAB — PTH, INTACT AND CALCIUM
Calcium: 8.8 mg/dL (ref 8.4–10.5)
PTH: 61.6 pg/mL (ref 14.0–72.0)

## 2013-06-12 LAB — ANEMIA PANEL
%SAT: 22 % (ref 20–55)
ABS RETIC: 78.6 10*3/uL (ref 19.0–186.0)
Ferritin: 208 ng/mL (ref 10–291)
Folate: >20 ng/mL
Iron: 63 ug/dL (ref 42–145)
RBC.: 3.93 MIL/uL (ref 3.87–5.11)
Retic Ct Pct: 2 % (ref 0.4–2.3)
TIBC: 292 ug/dL (ref 250–470)
UIBC: 229 ug/dL (ref 125–400)
Vitamin B-12: 809 pg/mL (ref 211–911)

## 2013-06-12 LAB — MICROALBUMIN / CREATININE URINE RATIO
CREATININE, URINE: 37.9 mg/dL
MICROALB/CREAT RATIO: 3773.9 mg/g — AB (ref 0.0–30.0)
Microalb, Ur: 143.03 mg/dL — ABNORMAL HIGH (ref 0.00–1.89)

## 2013-06-12 NOTE — Addendum Note (Signed)
Addended by: Lorretta HarpNIU, Cerita Rabelo on: 06/12/2013 09:40 AM   Modules accepted: Orders

## 2013-06-14 NOTE — Progress Notes (Signed)
Case discussed with Dr. Niu soon after the resident saw the patient.  We reviewed the resident's history and exam and pertinent patient test results.  I agree with the assessment, diagnosis, and plan of care documented in the resident's note. 

## 2013-07-09 ENCOUNTER — Other Ambulatory Visit: Payer: Self-pay | Admitting: Internal Medicine

## 2013-08-13 ENCOUNTER — Encounter: Payer: Self-pay | Admitting: Internal Medicine

## 2013-08-13 ENCOUNTER — Ambulatory Visit (INDEPENDENT_AMBULATORY_CARE_PROVIDER_SITE_OTHER): Payer: Medicare Other | Admitting: Internal Medicine

## 2013-08-13 VITALS — BP 124/74 | HR 79 | Temp 97.0°F | Ht 60.0 in | Wt 110.3 lb

## 2013-08-13 DIAGNOSIS — B9620 Unspecified Escherichia coli [E. coli] as the cause of diseases classified elsewhere: Secondary | ICD-10-CM | POA: Insufficient documentation

## 2013-08-13 DIAGNOSIS — I129 Hypertensive chronic kidney disease with stage 1 through stage 4 chronic kidney disease, or unspecified chronic kidney disease: Secondary | ICD-10-CM | POA: Diagnosis not present

## 2013-08-13 DIAGNOSIS — S99919A Unspecified injury of unspecified ankle, initial encounter: Secondary | ICD-10-CM | POA: Diagnosis not present

## 2013-08-13 DIAGNOSIS — E119 Type 2 diabetes mellitus without complications: Secondary | ICD-10-CM | POA: Diagnosis not present

## 2013-08-13 DIAGNOSIS — K219 Gastro-esophageal reflux disease without esophagitis: Secondary | ICD-10-CM | POA: Diagnosis not present

## 2013-08-13 DIAGNOSIS — Z0000 Encounter for general adult medical examination without abnormal findings: Secondary | ICD-10-CM | POA: Diagnosis not present

## 2013-08-13 DIAGNOSIS — N183 Chronic kidney disease, stage 3 unspecified: Secondary | ICD-10-CM | POA: Diagnosis not present

## 2013-08-13 DIAGNOSIS — I10 Essential (primary) hypertension: Secondary | ICD-10-CM | POA: Diagnosis present

## 2013-08-13 DIAGNOSIS — R300 Dysuria: Secondary | ICD-10-CM

## 2013-08-13 DIAGNOSIS — S8990XA Unspecified injury of unspecified lower leg, initial encounter: Secondary | ICD-10-CM | POA: Diagnosis not present

## 2013-08-13 DIAGNOSIS — S99922A Unspecified injury of left foot, initial encounter: Secondary | ICD-10-CM

## 2013-08-13 DIAGNOSIS — X58XXXA Exposure to other specified factors, initial encounter: Secondary | ICD-10-CM

## 2013-08-13 DIAGNOSIS — S99929A Unspecified injury of unspecified foot, initial encounter: Secondary | ICD-10-CM

## 2013-08-13 DIAGNOSIS — N390 Urinary tract infection, site not specified: Secondary | ICD-10-CM

## 2013-08-13 LAB — BASIC METABOLIC PANEL WITH GFR
BUN: 28 mg/dL — AB (ref 6–23)
CHLORIDE: 106 meq/L (ref 96–112)
CO2: 26 meq/L (ref 19–32)
Calcium: 8.6 mg/dL (ref 8.4–10.5)
Creat: 2.01 mg/dL — ABNORMAL HIGH (ref 0.50–1.10)
GFR, EST NON AFRICAN AMERICAN: 23 mL/min — AB
GFR, Est African American: 26 mL/min — ABNORMAL LOW
Glucose, Bld: 202 mg/dL — ABNORMAL HIGH (ref 70–99)
Potassium: 4.1 mEq/L (ref 3.5–5.3)
Sodium: 139 mEq/L (ref 135–145)

## 2013-08-13 LAB — GLUCOSE, CAPILLARY: Glucose-Capillary: 226 mg/dL — ABNORMAL HIGH (ref 70–99)

## 2013-08-13 NOTE — Assessment & Plan Note (Signed)
She saw ortho who did not intervene. This will likely take time to heal Pt taking OTC Tylenol prn for pain  Encouraged not to exert too much stress on foot  RTC if worsening

## 2013-08-13 NOTE — Assessment & Plan Note (Signed)
Recent BMET GFR 29 CKD 4  Pt was referred to renal not sure if she went  Will check BMET again today

## 2013-08-13 NOTE — Progress Notes (Signed)
Subjective:    Patient ID: Bianca [name], female    DOB: August 22, 1930, 80 y.o.   MRN: [id number]  HPI Comments: 80 y.o Past Medical History Diabetes mellitus, type 2, GERD (gastroesophageal reflux disease), Hepatitis B, Hyperlipemia, Hypertension, Cervical dysplasia, Atrophic vaginitis, Spinal stenosis  s/p decompression, CAD (coronary artery disease) s/p CABG, Hx of hysterectomy, CKD 4(reffereed to renal)     She presents for:  Of note patient is from [place] and Salome Hautala is with her [phone]).  He speaks [place] but she does not and the translator did not show up today.   1) DM f/u cbg is 226 today with HA1C 9.3 06/11/13.  She has wrong medicare card so cant get eye exam.  We did do a foot exam today.  According to EPIC she is taking Lantus 20 units at night and Novolog 8 units tid.  Yolanda Bonine is unclear what medications she is taking and patient states she is taking 8 units, 10 units and 25 units though last epic note says take the previously mentioned doses. Will have her met with Butch Penny plyer with translator to discuss insulin medications.   She has hypoglycemia noted on her meter with lunch reading of 58 and 4:30 am reading of 65.  cbg readings rance from 50s-390s. During low cbg she is shaky with sob, sweating.  Her grandson states they try to get her to eat a snack but she states she does not want all of that food.   2) HTN f/u. BP 124/74.  She brought some of her medications and she is taking Norvasc, Coreg, Hydralazine, and Imdur  3)She has pain left foot with walking noted since before 06/2013. She had a injury with nondisplaced chip fracture base of the proximal phalanx of the left second toe on imaging.  She saw ortho per grandson and they said area has to heel on its own no current intervention.   She is taking OTC Tylenol. The foot is still swollen but less than initial injury   4) She c/o dysuria.  She recently went to an urgent care and is taking Cipro 500 mg bid x 7 days  for UTI                          5) CKD 4 referred to renal. Uncertain if patient has followed as of yet.  Will repeat BMET today     Review of Systems  Respiratory: Negative for shortness of breath.   Cardiovascular: Negative for chest pain.  Genitourinary: Positive for dysuria and frequency.  Musculoskeletal: Positive for arthralgias.       Left foot pain since injury       Objective:   Physical Exam  Nursing note and vitals reviewed. Constitutional: She is oriented to person, place, and time. Vital signs are normal. She appears well-developed and well-nourished. She is cooperative. No distress.  HENT:  Head: Normocephalic and atraumatic.  Mouth/Throat: Oropharynx is clear and moist and mucous membranes are normal. No oropharyngeal exudate.  Eyes: Conjunctivae are normal. Pupils are equal, round, and reactive to light. Right eye exhibits no discharge. Left eye exhibits no discharge. No scleral icterus.  Cardiovascular: Normal rate, regular rhythm, S1 normal, S2 normal and normal heart sounds.   No murmur heard. Trace lower ext edema   Pulmonary/Chest: Effort normal and breath sounds normal. No respiratory distress. She has no wheezes.  Abdominal: Soft. Bowel sounds are normal. There is no  tenderness.  Musculoskeletal:  Left foot w/ min ttp left second toe plantar surface  Left foot with mild swelling and reddish brown hue on dorsal surface  Neurological: She is alert and oriented to person, place, and time. Gait normal.  Skin: Skin is warm, dry and intact. No rash noted. She is not diaphoretic.  Psychiatric: She has a normal mood and affect. Her speech is normal and behavior is normal. Judgment and thought content normal. Cognition and memory are normal.          Assessment & Plan:  F/u with Barry Brunner this week to disc. DM medications and what the patient is actually doing and hypoglycemic events with translator, then f/u in 1 month with PCP

## 2013-08-13 NOTE — Assessment & Plan Note (Signed)
BP Readings from Last 3 Encounters:  08/13/13 124/74  06/11/13 183/85  05/10/12 124/68    Lab Results  Component Value Date   NA 138 06/11/2013   K 3.9 06/11/2013   CREATININE 1.67* 06/11/2013    Assessment: Blood pressure control: controlled Progress toward BP goal:  at goal Comments: none  Plan: Medications:  continue current medications (Norvasc 5, Coreg 25 bid, Hydralazine 25 mg tid, Imdur 30 Educational resources provided: other (see comments) Self management tools provided: other (see comments) Other plans: will check BMET today

## 2013-08-13 NOTE — Patient Instructions (Addendum)
General Instructions: Please follow up in 1 month We will check your labs today and I will send you a letter    Treatment Goals: Blood pressure 124/74 today   Goals (1 Years of Data) as of 08/13/13         06/11/13 05/10/12 04/11/12 09/03/11     Blood Pressure    . Blood Pressure < 140/80  183/85 124/68       Result Component    . HEMOGLOBIN A1C < 8  9.3  6.8     . LDL CALC < 100  106   54      Progress Toward Treatment Goals:  Treatment Goal 08/13/2013  Hemoglobin A1C unable to assess  Blood pressure at goal  Prevent falls at goal    Self Care Goals & Plans:  Self Care Goal 08/13/2013  Manage my medications take my medicines as prescribed; bring my medications to every visit; refill my medications on time  Monitor my health keep track of my blood pressure; keep track of my weight; bring my blood pressure log to each visit; keep track of my blood glucose; bring my glucose meter and log to each visit; check my feet daily  Eat healthy foods drink diet soda or water instead of juice or soda; eat more vegetables; eat foods that are low in salt; eat baked foods instead of fried foods; eat fruit for snacks and desserts; eat smaller portions  Be physically active find an activity I enjoy  Meeting treatment goals maintain the current self-care plan    Home Blood Glucose Monitoring 08/13/2013  Check my blood sugar 3 times a day  When to check my blood sugar before meals     Care Management & Community Referrals:  Referral 08/13/2013  Referrals made for care management support none needed  Referrals made to community resources none       Exercise to Lose Weight Exercise and a healthy diet may help you lose weight. Your doctor may suggest specific exercises. EXERCISE IDEAS AND TIPS  Choose low-cost things you enjoy doing, such as walking, bicycling, or exercising to workout videos.  Take stairs instead of the elevator.  Walk during your lunch break.  Park your car further  away from work or school.  Go to a gym or an exercise class.  Start with 5 to 10 minutes of exercise each day. Build up to 30 minutes of exercise 4 to 6 days a week.  Wear shoes with good support and comfortable clothes.  Stretch before and after working out.  Work out until you breathe harder and your heart beats faster.  Drink extra water when you exercise.  Do not do so much that you hurt yourself, feel dizzy, or get very short of breath. Exercises that burn about 150 calories:  Running 1  miles in 15 minutes.  Playing volleyball for 45 to 60 minutes.  Washing and waxing a car for 45 to 60 minutes.  Playing touch football for 45 minutes.  Walking 1  miles in 35 minutes.  Pushing a stroller 1  miles in 30 minutes.  Playing basketball for 30 minutes.  Raking leaves for 30 minutes.  Bicycling 5 miles in 30 minutes.  Walking 2 miles in 30 minutes.  Dancing for 30 minutes.  Shoveling snow for 15 minutes.  Swimming laps for 20 minutes.  Walking up stairs for 15 minutes.  Bicycling 4 miles in 15 minutes.  Gardening for 30 to 45 minutes.  Jumping rope  for 15 minutes.  Washing windows or floors for 45 to 60 minutes. Document Released: 05/22/2010 Document Revised: 07/12/2011 Document Reviewed: 05/22/2010 [facility] Patient Information 2014 [place], [place].  Type 2 Diabetes Mellitus, Adult Type 2 diabetes mellitus is a long-term (chronic) disease. In type 2 diabetes:  The pancreas does not make enough of a hormone called insulin.  The cells in the body do not respond as well to the insulin that is made.  Both of the above can happen. Normally, insulin moves sugars from food into tissue cells. This gives you energy. If you have type 2 diabetes, sugars cannot be moved into tissue cells. This causes high blood sugar (hyperglycemia).  HOME CARE  Have your hemoglobin A1c level checked twice a year. The level shows if your diabetes is under control or out of  control.  Perform daily blood sugar testing as told by your doctor.  Check your ketone levels by testing your pee (urine) when you are sick and as told.  Take your diabetes or insulin medicine as told by your doctor.  Never run out of insulin.  Adjust how much insulin you give yourself based on how many carbs (carbohydrates) you eat. Carbs are in many foods, such as fruits, vegetables, whole grains, and dairy products.  Have a healthy snack between every healthy meal. Have 3 meals and 3 snacks a day.  Lose weight if you are overweight.  Carry a medical alert card or wear your medical alert jewelry.  Carry a 15 gram carb snack with you at all times. Examples include:  Glucose pills, 3 or 4.  Glucose gel, 15 gram tube.  Raisins, 2 tablespoons (24 grams).  Jelly beans, 6.  Animal crackers, 8.  Sugar pop, 4 ounces (120 milliliters).  Gummy treats, 9.  Notice low blood sugar (hypoglycemia) symptoms, such as:  Shaking (tremors).  Decreased ability to think clearly.  Sweating.  Increased heart rate.  Headache.  Dry mouth.  Hunger.  Crabbiness (irritability).  Being worried or tense (anxiety).  Restless sleep.  A change in speech or coordination.  Confusion.  Treat low blood sugar right away. If you are alert and can swallow, follow the 15:15 rule:  Take 15 20 grams of a rapid-acting glucose or carb. This includes glucose gel, glucose pills, or 4 ounces (120 milliliters) of fruit juice, regular pop, or low-fat milk.  Check your blood sugar level after taking the glucose.  Take 15 20 grams of more glucose if the repeat blood sugar level is still 70 mg/dL (milligrams/deciliter) or below.  Eat a meal or snack within 1 hour of the blood sugar levels going back to normal.  Notice early symptoms of high blood sugar, such as:  Being really thirsty or drinking a lot (polydipsia).  Peeing (urinating) a lot (polyuria).  Do at least 150 minutes of physical  activity a week or as told.  Split the 150 minutes of activity up during the week. Do not do 150 minutes of activity in one day.  Perform exercises, such as weight lifting, at least 2 times a week or as told.  Adjust your insulin or food intake as needed if you start a new exercise or sport.  Follow your sick day plan when you are not able to eat or drink as usual.  Avoid tobacco use.  Women who are not pregnant should drink no more than 1 drink a day. Men should drink no more than 2 drinks a day.  Only drink alcohol with food.  Ask your doctor if alcohol is safe for you.  Tell your doctor if you drink alcohol several times during the week.  See your doctor regularly.  Schedule an eye exam soon after you are diagnosed with diabetes. Schedule exams once every year.  Check your skin and feet every day. Check for cuts, bruises, redness, nail problems, bleeding, blisters, or sores. A doctor should do a foot exam once a year.  Brush your teeth and gums twice a day. Floss once a day. Visit your dentist regularly.  Share your diabetes plan with your workplace or school.  Stay up-to-date with shots that fight against diseases (immunizations).  Learn how to manage stress.  Get diabetes education and support as needed.  Ask your doctor for special help if:  You need help to maintain or improve how you to do things on your own.  You need help to maintain or improve the quality of your life.  You have foot or hand problems.  You have trouble cleaning yourself, dressing, eating, or doing physical activity. GET HELP RIGHT AWAY IF:  You have trouble breathing.  You have moderate to large ketone levels.  You are unable to eat food or drink fluids for more than 6 hours.  You feel sick to your stomach (nauseous) or throw up (vomit) for more than 6 hours.  Your blood sugar level is over 240 mg/dL.  There is a change in mental status.  You get another serious illness.  You  have watery poop (diarrhea) for more than 6 hours.  You have been sick or have had a fever for 2 or more days and are not getting better.  You have pain when you are physically active. MAKE SURE YOU:  Understand these instructions.  Will watch your condition.  Will get help right away if you are not doing well or get worse. Document Released: 01/27/2008 Document Revised: 02/07/2013 Document Reviewed: 08/18/2012 [facility] Patient Information 2014 [place], [place].  DASH Diet The DASH diet stands for "Dietary Approaches to Stop Hypertension." It is a healthy eating plan that has been shown to reduce high blood pressure (hypertension) in as little as 14 days, while also possibly providing other significant health benefits. These other health benefits include reducing the risk of breast cancer after menopause and reducing the risk of type 2 diabetes, heart disease, colon cancer, and stroke. Health benefits also include weight loss and slowing kidney failure in patients with chronic kidney disease.  DIET GUIDELINES  Limit salt (sodium). Your diet should contain less than 1500 mg of sodium daily.  Limit refined or processed carbohydrates. Your diet should include mostly whole grains. Desserts and added sugars should be used sparingly.  Include small amounts of heart-healthy fats. These types of fats include nuts, oils, and tub margarine. Limit saturated and trans fats. These fats have been shown to be harmful in the body. CHOOSING FOODS  The following food groups are based on a 2000 calorie diet. See your Registered Dietitian for individual calorie needs. Grains and Grain Products (6 to 8 servings daily)  Eat More Often: Whole-wheat bread, brown rice, whole-grain or wheat pasta, quinoa, popcorn without added fat or salt (air popped).  Eat Less Often: White bread, white pasta, white rice, cornbread. Vegetables (4 to 5 servings daily)  Eat More Often: Fresh, frozen, and canned vegetables.  Vegetables may be raw, steamed, roasted, or grilled with a minimal amount of fat.  Eat Less Often/Avoid: Creamed or fried vegetables. Vegetables in a cheese sauce.  Fruit (4 to 5 servings daily)  Eat More Often: All fresh, canned (in natural juice), or frozen fruits. Dried fruits without added sugar. One hundred percent fruit juice ( cup [237 mL] daily).  Eat Less Often: Dried fruits with added sugar. Canned fruit in light or heavy syrup. [employer], Fish, and Poultry (2 servings or less daily. One serving is 3 to 4 oz [85-114 g]).  Eat More Often: Ninety percent or leaner ground beef, tenderloin, sirloin. Round cuts of beef, chicken breast, [place] breast. All fish. Grill, bake, or broil your meat. Nothing should be fried.  Eat Less Often/Avoid: Fatty cuts of meat, [place], or chicken leg, thigh, or wing. Fried cuts of meat or fish. Dairy (2 to 3 servings)  Eat More Often: Low-fat or fat-free milk, low-fat plain or light yogurt, reduced-fat or part-skim cheese.  Eat Less Often/Avoid: Milk (whole, 2%).Whole milk yogurt. Full-fat cheeses. Nuts, Seeds, and Legumes (4 to 5 servings per week)  Eat More Often: All without added salt.  Eat Less Often/Avoid: Salted nuts and seeds, canned beans with added salt. Fats and Sweets (limited)  Eat More Often: Vegetable oils, tub margarines without trans fats, sugar-free gelatin. Mayonnaise and salad dressings.  Eat Less Often/Avoid: Coconut oils, palm oils, butter, stick margarine, cream, half and half, cookies, candy, pie. FOR MORE INFORMATION The Dash Diet Eating Plan: www.dashdiet.org Document Released: 04/08/2011 Document Revised: 07/12/2011 Document Reviewed: 04/08/2011 [facility] Patient Information 2014 [place], [place].  Hypertension As your heart beats, it forces blood through your arteries. This force is your blood pressure. If the pressure is too high, it is called hypertension (HTN) or high blood pressure. HTN is dangerous because you may  have it and not know it. High blood pressure may mean that your heart has to work harder to pump blood. Your arteries may be narrow or stiff. The extra work puts you at risk for heart disease, stroke, and other problems.  Blood pressure consists of two numbers, a higher number over a lower, 110/72, for example. It is stated as "110 over 72." The ideal is below 120 for the top number (systolic) and under 80 for the bottom (diastolic). Write down your blood pressure today. You should pay close attention to your blood pressure if you have certain conditions such as:  Heart failure.  Prior heart attack.  Diabetes  Chronic kidney disease.  Prior stroke.  Multiple risk factors for heart disease. To see if you have HTN, your blood pressure should be measured while you are seated with your arm held at the level of the heart. It should be measured at least twice. A one-time elevated blood pressure reading (especially in the Emergency Department) does not mean that you need treatment. There may be conditions in which the blood pressure is different between your right and left arms. It is important to see your caregiver soon for a recheck. Most people have essential hypertension which means that there is not a specific cause. This type of high blood pressure may be lowered by changing lifestyle factors such as:  Stress.  Smoking.  Lack of exercise.  Excessive weight.  Drug/tobacco/alcohol use.  Eating less salt. Most people do not have symptoms from high blood pressure until it has caused damage to the body. Effective treatment can often prevent, delay or reduce that damage. TREATMENT  When a cause has been identified, treatment for high blood pressure is directed at the cause. There are a large number of medications to treat HTN. These fall  into several categories, and your caregiver will help you select the medicines that are best for you. Medications may have side effects. You should review  side effects with your caregiver. If your blood pressure stays high after you have made lifestyle changes or started on medicines,   Your medication(s) may need to be changed.  Other problems may need to be addressed.  Be certain you understand your prescriptions, and know how and when to take your medicine.  Be sure to follow up with your caregiver within the time frame advised (usually within two weeks) to have your blood pressure rechecked and to review your medications.  If you are taking more than one medicine to lower your blood pressure, make sure you know how and at what times they should be taken. Taking two medicines at the same time can result in blood pressure that is too low. SEEK IMMEDIATE MEDICAL CARE IF:  You develop a severe headache, blurred or changing vision, or confusion.  You have unusual weakness or numbness, or a faint feeling.  You have severe chest or abdominal pain, vomiting, or breathing problems. MAKE SURE YOU:   Understand these instructions.  Will watch your condition.  Will get help right away if you are not doing well or get worse. Document Released: 04/19/2005 Document Revised: 07/12/2011 Document Reviewed: 12/08/2007 [facility] Patient Information 2014 [place], [place].  Fall Prevention and Home Safety Falls cause injuries and can affect all age groups. It is possible to prevent falls.  HOW TO PREVENT FALLS  Wear shoes with rubber soles that do not have an opening for your toes.  Keep the inside and outside of your house well lit.  Use night lights throughout your home.  Remove clutter from floors.  Clean up floor spills.  Remove throw rugs or fasten them to the floor with carpet tape.  Do not place electrical cords across pathways.  Put grab bars by your tub, shower, and toilet. Do not use towel bars as grab bars.  Put handrails on both sides of the stairway. Fix loose handrails.  Do not climb on stools or stepladders, if  possible.  Do not wax your floors.  Repair uneven or unsafe sidewalks, walkways, or stairs.  Keep items you use a lot within reach.  Be aware of pets.  Keep emergency numbers next to the telephone.  Put smoke detectors in your home and near bedrooms. Ask your doctor what other things you can do to prevent falls. Document Released: 02/13/2009 Document Revised: 10/19/2011 Document Reviewed: 07/20/2011 [facility] Patient Information 2014 [place], [place].

## 2013-08-13 NOTE — Assessment & Plan Note (Signed)
She had dysuria and freq.  She recently went to Urgent care (on [address]) dx'ed with UTI and on Cipro 500 mg bid x 7 days (still taking) Will check UA with reflex today

## 2013-08-13 NOTE — Assessment & Plan Note (Addendum)
Lab Results  Component Value Date   HGBA1C 9.3 06/11/2013   HGBA1C 6.8 04/11/2012   HGBA1C 12.5 12/21/2011     Assessment: Diabetes control: poor control (HgbA1C >9%) Progress toward A1C goal:  unable to assess Comments: [place]'s is from [place] and [place] not speak [place]. Lucila MaineGrandson is not able to tell me what insulin medications she is taking at home and the translator is not here.  HA1C not at goal of 8 (given her age). 2 episodes of hypoglycemia 58 and 65 on meter readings   Plan: Medications:  in epic pt is supposed to take Novolog 8 units tid and Lantus 20 units at night though I nor her grandson is sure what she is giving herself at home.   Home glucose monitoring: Frequency: 3 times a day Timing: before meals Instruction/counseling given: provided printed educational material Educational resources provided: other (see comments) Self management tools provided: other (see comments);copy of home glucose meter download Other plans: pt will f/u this week with translator and Gavin Poundonna Plyer [facility] to review insulin (Lantus and Novolog) to see what she is doing and help prevent hypoglycemia in the future by titration on medication accordingly. Cant refer to eye MD b/c insurance card is not correct. DM foot exam today

## 2013-08-14 ENCOUNTER — Encounter: Payer: Medicare Other | Admitting: Dietician

## 2013-08-14 ENCOUNTER — Encounter: Payer: Self-pay | Admitting: Internal Medicine

## 2013-08-14 LAB — URINALYSIS, ROUTINE W REFLEX MICROSCOPIC
Bilirubin Urine: NEGATIVE
Glucose, UA: 500 mg/dL — AB
KETONES UR: NEGATIVE mg/dL
Nitrite: NEGATIVE
Protein, ur: >300 mg/dL — AB
Specific Gravity, Urine: 1.018 (ref 1.005–1.030)
Urobilinogen, UA: 0.2 mg/dL (ref 0.0–1.0)
pH: 5 (ref 5.0–8.0)

## 2013-08-14 LAB — URINALYSIS, MICROSCOPIC ONLY
Casts: NONE SEEN
Crystals: NONE SEEN

## 2013-08-14 NOTE — Progress Notes (Signed)
INTERNAL MEDICINE TEACHING ATTENDING ADDENDUM - Nur Rabold, MD: I reviewed and discussed at the time of visit with the resident Dr. Mclean, the patient’s medical history, physical examination, diagnosis and results of tests and treatment and I agree with the patient's care as documented. °

## 2013-08-14 NOTE — Addendum Note (Signed)
Addended by: Annett GulaMCLEAN, TRACY N on: 08/14/2013 10:57 AM   Modules accepted: Orders

## 2013-08-15 ENCOUNTER — Ambulatory Visit (INDEPENDENT_AMBULATORY_CARE_PROVIDER_SITE_OTHER): Payer: Medicare Other | Admitting: Dietician

## 2013-08-15 ENCOUNTER — Encounter: Payer: Self-pay | Admitting: Dietician

## 2013-08-15 VITALS — Wt 110.6 lb

## 2013-08-15 DIAGNOSIS — E119 Type 2 diabetes mellitus without complications: Secondary | ICD-10-CM

## 2013-08-15 NOTE — Progress Notes (Addendum)
Medical Nutrition Therapy:  Appt start time: 1415 end time:  1450.  Assessment:  Family member who does not reside with her and knows little about her medical care, Weston Brassick, with patient and serving as Nurse, learning disabilitytranslator. Primary concerns today: Blood sugar control. CBG 160 on her Contour Next meter today in office, which was about 4 hours after breakfast and taking 8 units of Novolog. Pt skips days of blood glucose testing according to how she feels. Pt reports her last hypoglycemic event was last week and they had to call EMT. Pt reports a decreased appetite. Pt mostly eats rice at meals. Noted pt's weight is stable.  Meal times are: B (~10AM) L (~2:30PM) D (~6 PM)  Medications: Family member says patient  refuses to allow family to assist her with insulin injections. She reports taking Novolog twice a day before breakfast and lunch and Lantus once a day before dinner. Family members have told her she does not need to take her insulin if she doesn't need it. Evaluated insulin technique, which was inadequate with vial and syringe likely adequate with insulin pen. Concerned about accuracy of her Lantus doses with earl am hypoglycemia and reported 25 units lantus as her usual dose. Also noted on her last eye exam (~2012) refused glasses with 20/200 and 20/400 vision.  Progress Towards Goal(s):  No progress.   Nutritional Diagnosis:  -2.3 Food-medication interaction As related to imbalance between nutrient intake and exogenous insulin.  As evidenced by suspected low blood sugars and mildly elevated A1C.    Intervention:  1- Nutrition education to family member about preventing hypoglycemia by matching insulin to food, importance of self monitoring daily. 2- Coordination of care- Plan to discuss adjusting insulin dose with physician- considered decreased meal time insulin  Monitoring/Evaluation:  Dietary intake, blood sugars, insulin and body weight as soon as family contacts CDE.

## 2013-08-15 NOTE — Patient Instructions (Signed)
Please have a person who knows how much insulin Bianca [name] is taking or set up and appointment with [name] [phone].  Please have [name] check blood sugar every day. Current insulin doses prescribed are:  1- 8 units Novolog before Breakfast 2- 8 units Novolog before Lunch 3- 8 units Novolog before Dinner 4- 20 units Lantus before bed or the same time every day - can be eveving or morning.  WE need to know how much she is really taking so we can adjust her insulin regimen to be sure she is safe and prevent low blood sugars.   Thanks you for coming today

## 2013-08-16 ENCOUNTER — Other Ambulatory Visit: Payer: Self-pay | Admitting: Internal Medicine

## 2013-08-16 LAB — URINE CULTURE: Colony Count: >=100000

## 2013-08-16 MED ORDER — AMOXICILLIN-POT CLAVULANATE 500-125 MG PO TABS: 1.0000 | ORAL_TABLET | Freq: Two times a day (BID) | ORAL | Status: DC

## 2013-08-16 MED ORDER — SULFAMETHOXAZOLE-TMP DS 800-160 MG PO TABS: 1.0000 | ORAL_TABLET | Freq: Two times a day (BID) | ORAL | Status: DC

## 2013-08-16 NOTE — Progress Notes (Signed)
Urine cultures reviewed. Agree with Dr. Alford HighlandMcLean's recommendation of Augmentin for UTI resistant to quinolones

## 2013-08-20 ENCOUNTER — Other Ambulatory Visit: Payer: Self-pay | Admitting: Internal Medicine

## 2013-08-20 DIAGNOSIS — I2510 Atherosclerotic heart disease of native coronary artery without angina pectoris: Secondary | ICD-10-CM

## 2013-09-11 ENCOUNTER — Telehealth: Payer: Self-pay | Admitting: Dietician

## 2013-09-11 NOTE — Telephone Encounter (Signed)
Calling to follow up on finding out doses of insulin patient is taking.

## 2013-09-17 ENCOUNTER — Other Ambulatory Visit: Payer: Self-pay | Admitting: Internal Medicine

## 2013-09-17 DIAGNOSIS — E119 Type 2 diabetes mellitus without complications: Secondary | ICD-10-CM

## 2013-10-05 ENCOUNTER — Other Ambulatory Visit: Payer: Self-pay | Admitting: Internal Medicine

## 2013-10-05 DIAGNOSIS — R0602 Shortness of breath: Secondary | ICD-10-CM

## 2013-11-22 ENCOUNTER — Other Ambulatory Visit: Payer: Self-pay | Admitting: Internal Medicine

## 2013-12-10 ENCOUNTER — Ambulatory Visit (INDEPENDENT_AMBULATORY_CARE_PROVIDER_SITE_OTHER): Payer: Medicare Other | Admitting: Internal Medicine

## 2013-12-10 ENCOUNTER — Encounter: Payer: Self-pay | Admitting: Internal Medicine

## 2013-12-10 ENCOUNTER — Ambulatory Visit (HOSPITAL_COMMUNITY)
Admission: RE | Admit: 2013-12-10 | Discharge: 2013-12-10 | Disposition: A | Discharge status: Home / Self Care | Payer: Medicare Other | Source: Ambulatory Visit | Attending: Internal Medicine | Admitting: Internal Medicine

## 2013-12-10 VITALS — BP 117/70 | HR 75 | Temp 97.0°F | Wt 113.2 lb

## 2013-12-10 DIAGNOSIS — E119 Type 2 diabetes mellitus without complications: Secondary | ICD-10-CM

## 2013-12-10 DIAGNOSIS — M79609 Pain in unspecified limb: Secondary | ICD-10-CM

## 2013-12-10 DIAGNOSIS — N183 Chronic kidney disease, stage 3 unspecified: Secondary | ICD-10-CM

## 2013-12-10 DIAGNOSIS — M25519 Pain in unspecified shoulder: Secondary | ICD-10-CM | POA: Insufficient documentation

## 2013-12-10 DIAGNOSIS — M949 Disorder of cartilage, unspecified: Secondary | ICD-10-CM

## 2013-12-10 DIAGNOSIS — M810 Age-related osteoporosis without current pathological fracture: Secondary | ICD-10-CM

## 2013-12-10 DIAGNOSIS — M899 Disorder of bone, unspecified: Secondary | ICD-10-CM | POA: Insufficient documentation

## 2013-12-10 DIAGNOSIS — I2510 Atherosclerotic heart disease of native coronary artery without angina pectoris: Secondary | ICD-10-CM

## 2013-12-10 DIAGNOSIS — M79644 Pain in right finger(s): Secondary | ICD-10-CM | POA: Insufficient documentation

## 2013-12-10 DIAGNOSIS — K5900 Constipation, unspecified: Secondary | ICD-10-CM

## 2013-12-10 DIAGNOSIS — M25512 Pain in left shoulder: Secondary | ICD-10-CM | POA: Insufficient documentation

## 2013-12-10 LAB — POCT GLYCOSYLATED HEMOGLOBIN (HGB A1C): Hemoglobin A1C: 7.7

## 2013-12-10 LAB — GLUCOSE, CAPILLARY: Glucose-Capillary: 261 mg/dL — ABNORMAL HIGH (ref 70–99)

## 2013-12-10 MED ORDER — TRAMADOL HCL 50 MG PO TABS: 50.0000 mg | ORAL_TABLET | Freq: Four times a day (QID) | ORAL | Status: DC | PRN

## 2013-12-10 MED ORDER — DOCUSATE SODIUM 100 MG PO CAPS: 100.0000 mg | ORAL_CAPSULE | Freq: Every day | ORAL | Status: DC | PRN

## 2013-12-10 MED ORDER — CALCIUM CITRATE-VITAMIN D 315-200 MG-UNIT PO TABS: 2.0000 | ORAL_TABLET | Freq: Two times a day (BID) | ORAL | Status: DC

## 2013-12-10 NOTE — Patient Instructions (Signed)
It was a pleasure taking care of you today, Bianca [name].  Here is a summary of what we discussed:  1. Left shoulder pain and right finger pain: - X- ray of left shoulder - X-ray of right hand - Ultram for pain  2. Osteoporosis - Start taking Calcium and Vitamin D  3. Chronic Kidney Disease - Referral to nephrology made

## 2013-12-10 NOTE — Progress Notes (Signed)
   Subjective:    Patient ID: Bianca BurnetJieh T [name], female    DOB: Oct 27, 1930, 80 y.o.   MRN: [id number]  HPI [name] is a 80yo woman w/ PMHx of DM Type 2 (HbA1c today 7.7, previously 9.3 on 06/11/13), GERD, HLD, HTN, osteoporosis, CAD s/p CABG, and CKD stage 3 who presents today for the following:  1. Left shoulder pain/Right index finger pain in setting of osteoporosis: Pt states she has had left shoulder pain for several months now. She states the pain is a 10/10 in severity and is located in her shoulder and axilla. The pain keeps her up at night and affects her daily activities. She report right index finger pain that also has been ongoing for several months. She states the pain is 8/10 in severity and she has difficulty bending the finger. She denies any falls or bumping into objects  in the past few months. She cannot recall the instant the pain began. She has been taking Tylenol occasionally for the pain.   Pt had Dexa scan in 2007 that showed -3 right femur, -1 left femur, and -2.2 spine. Pt currently not on Vitamin D, Calcium, or a bisphosphonate. Pt has hx of right femoral fracture in 2009. Concern that left shoulder pain and right index finger pain could be secondary to fractures given her severe osteoporosis. Pt does not eat dairy according to family.   2. DM Type 2: HbA1c 7.7 today, improved from 9.3 on 06/11/13. Pt's insulin regimen includes Novolog 8 units TID with meals and Lantus 20 units QHS. However, patient reports she takes 8 units at breakfast and lunch and 20 units at dinner. Blood sugars mostly in 160-280 range. Noticeably higher blood sugars in evening. Pt saw [name], nutrition and diabetes educator, on 08/15/13. Per her evaluation, patient not balancing food with insulin correctly. Family was educated on the importance of doing this.    Review of Systems General: Denies night sweats, fever, chills, changes in weight HEENT: Denies headaches, ear pain, rhinorrhea, sore  throat CV: Denies CP, palpitations, SOB, orthopnea Pulm: Denies SOB, cough, wheezing GI: Reports constipation. Denies abdominal pain, diarrhea GU: Denies dysuria, hematuria, frequency Msk: Denies muscle cramps, joint pain Neuro: Denies numbness, tingling, sudden weakness Skin: Denies rashes, bruising    Objective:   Physical Exam General: elderly woman sitting up on exam table, NAD HEENT: [place]/AT, EOMI, conjunctiva normal, mucus membranes moist Neck: supple, no JVD CV: RRR, normal S1/S2, no m/g/r Pulm: CTA bilaterally, breaths non-labored Abd: BS+, soft, non-distended, non-tender Ext: warm, no edema. Right index finger appears swollen, but nonerythematous. Restricted movement of right index finger. ROM restricted to 90 degrees in left upper extremity. Tenderness to palpation of left shoulder and axilla.  Neuro: alert and oriented x 3, CNs II-XII intact, strength 3/5 in upper extremities due to pain, 5/5 in lower extremities bilaterally       Assessment & Plan:

## 2013-12-10 NOTE — Assessment & Plan Note (Signed)
Pt had Dexa scan in 2007 showing -3 right femur, -1 left femur, -2.2 spine. Pt also has hx of right humoral fracture. Pt not taking calcium, vitamin D, or bisphosphonate at this time. - Start Calcium and Vitamin D - Check renal panel for Cr and GFR. To be started on Alendronate, patient must have GFR >35. Will start Alendronate if kidney function ok. - Recheck PTH

## 2013-12-10 NOTE — Assessment & Plan Note (Signed)
Right index finger pain for several months. Concern for fracture given osteoporosis history. - X-ray of right hand - Ultram for pain

## 2013-12-10 NOTE — Assessment & Plan Note (Signed)
Left shoulder pain and in axilla. Pt states pain has been present for several months. Concern for fracture given osteoporosis history. - X-ray left shoulder - Ultram for pain

## 2013-12-10 NOTE — Assessment & Plan Note (Signed)
Family notes they never received call from nephrology after referral. Nephrology notes state they tried to reach family several times with no success in 2013.  - Made another referral to Nephrology - Renal panel - PTH

## 2013-12-10 NOTE — Assessment & Plan Note (Signed)
Lab Results  Component Value Date   HGBA1C 7.7 12/10/2013   HGBA1C 9.3 06/11/2013   HGBA1C 6.8 04/11/2012     Assessment: Diabetes control: fair control Progress toward A1C goal:  improved Comments: HbA1c improved to 7.7 from 9.3 on 06/11/13.  Plan: Medications:  continue current medications Home glucose monitoring: Frequency:   Timing:   Instruction/counseling given: reminded to bring medications to each visit and discussed diet Educational resources provided:   Self management tools provided:   Other plans: Pt instructed that she needs to be taking a total of 4 injections a day, including Novolog 8 units TID with meals and Lantus 20 units at bedtime. The family and she understand. Family instructed to go back to Novolog 8 units BID and Lantus 20 units QHS if she develops hypoglycemia frequently. Family instructed on importance of balancing food and insulin.

## 2013-12-11 ENCOUNTER — Telehealth: Payer: Self-pay | Admitting: Internal Medicine

## 2013-12-11 LAB — RENAL FUNCTION PANEL
Albumin: 3.6 g/dL (ref 3.5–5.2)
BUN: 39 mg/dL — ABNORMAL HIGH (ref 6–23)
CO2: 23 mEq/L (ref 19–32)
CREATININE: 2.57 mg/dL — AB (ref 0.50–1.10)
Calcium: 8.5 mg/dL (ref 8.4–10.5)
Chloride: 107 mEq/L (ref 96–112)
Glucose, Bld: 268 mg/dL — ABNORMAL HIGH (ref 70–99)
Phosphorus: 3.7 mg/dL (ref 2.3–4.6)
Potassium: 4.9 mEq/L (ref 3.5–5.3)
Sodium: 138 mEq/L (ref 135–145)

## 2013-12-11 LAB — PTH, INTACT AND CALCIUM
CALCIUM: 8.4 mg/dL (ref 8.4–10.5)
PTH: 150.7 pg/mL — ABNORMAL HIGH (ref 14.0–72.0)

## 2013-12-11 NOTE — Telephone Encounter (Signed)
Spoke with daughter. Let her know that no fractures were found on x-rays and that medication for osteoporosis will not be started at this time due to her kidney function.

## 2013-12-11 NOTE — Progress Notes (Signed)
I saw and evaluated the patient.  I personally confirmed the key portions of the history and exam documented by Dr. Beckie Saltsivet and I reviewed pertinent patient test results.  The assessment, diagnosis, and plan were formulated together and I agree with the documentation in the resident's note. Will discuss with Dr Beckie Saltsivet sports med referral or [facility]. Pt and family will also need ongoing education about CRD as her Cr cont to increase.

## 2013-12-12 ENCOUNTER — Telehealth: Payer: Self-pay | Admitting: Internal Medicine

## 2013-12-12 NOTE — Telephone Encounter (Signed)
Spoke with grandson. Let him know that Bianca [name]'s appointment has been switched to August 25th at 2:15 PM and that if she agrees, she can receive a steroid injection for her left shoulder.

## 2013-12-14 LAB — VITAMIN D 1,25 DIHYDROXY
VITAMIN D 1, 25 (OH) TOTAL: 25 pg/mL (ref 18–72)
VITAMIN D3 1, 25 (OH): 25 pg/mL
Vitamin D2 1, 25 (OH)2: <8 pg/mL

## 2013-12-24 ENCOUNTER — Ambulatory Visit: Payer: Medicare Other | Admitting: Internal Medicine

## 2013-12-25 ENCOUNTER — Encounter: Payer: Self-pay | Admitting: Internal Medicine

## 2013-12-25 ENCOUNTER — Ambulatory Visit (INDEPENDENT_AMBULATORY_CARE_PROVIDER_SITE_OTHER): Payer: Medicare Other | Admitting: Internal Medicine

## 2013-12-25 VITALS — BP 117/63 | HR 65 | Temp 97.9°F | Ht 60.0 in | Wt 114.2 lb

## 2013-12-25 DIAGNOSIS — Z0000 Encounter for general adult medical examination without abnormal findings: Secondary | ICD-10-CM | POA: Diagnosis not present

## 2013-12-25 DIAGNOSIS — N390 Urinary tract infection, site not specified: Secondary | ICD-10-CM | POA: Diagnosis not present

## 2013-12-25 DIAGNOSIS — M25512 Pain in left shoulder: Secondary | ICD-10-CM

## 2013-12-25 DIAGNOSIS — N2581 Secondary hyperparathyroidism of renal origin: Secondary | ICD-10-CM

## 2013-12-25 DIAGNOSIS — I2510 Atherosclerotic heart disease of native coronary artery without angina pectoris: Secondary | ICD-10-CM

## 2013-12-25 DIAGNOSIS — M25519 Pain in unspecified shoulder: Secondary | ICD-10-CM

## 2013-12-25 DIAGNOSIS — M79644 Pain in right finger(s): Secondary | ICD-10-CM

## 2013-12-25 DIAGNOSIS — M79609 Pain in unspecified limb: Secondary | ICD-10-CM

## 2013-12-25 DIAGNOSIS — N184 Chronic kidney disease, stage 4 (severe): Secondary | ICD-10-CM

## 2013-12-25 DIAGNOSIS — R300 Dysuria: Secondary | ICD-10-CM | POA: Diagnosis present

## 2013-12-25 DIAGNOSIS — E1129 Type 2 diabetes mellitus with other diabetic kidney complication: Secondary | ICD-10-CM | POA: Diagnosis not present

## 2013-12-25 MED ORDER — ACETAMINOPHEN 500 MG PO TABS: 1000.0000 mg | ORAL_TABLET | Freq: Three times a day (TID) | ORAL | Status: AC | PRN

## 2013-12-25 MED ORDER — CALCITRIOL 0.25 MCG PO CAPS: 0.5000 ug | ORAL_CAPSULE | Freq: Every day | ORAL | Status: DC

## 2013-12-25 NOTE — Progress Notes (Signed)
   Subjective:    Patient ID: Bianca [name], female    DOB: 06-17-1930, 80 y.o.   MRN: [id number]  HPI Bianca [name] is a 80yo woman w/ PMHx of HTN, Type 2 DM, CAD s/p CABG in 2008, HLD, and CKD Stage 4 who presents today for the following:  * Interview was done through translator  1. Left Shoulder Pain and Right Index Finger Pain: Patient still complaining of shoulder pain. She states the pain is a 10/10 in severity and she has difficulty lifting up her left arm. She has tried Tylenol and Tramadol, which I prescribed to her at her last visit, with little relief. An x-ray of her shoulder was done on 8/10 that showed no fracture, only degenerative changes. She would like to have a steroid injection today to provide pain relief. She also is still complaining of right index finger pain. An x-ray of her right hand on 8/10 showed no fractures, but osteoarthritic changes. She notes pain with movement of the finger.   2. CKD Stage 4: Pt noted to have worsening kidney function on last renal function panel on 8/10, showing Cr 2.57 (up from 2.01 4 months prior) and BUN 39 (up from 28). Her PTH is also elevated at 150.7 on 8/10 with normal vitamin D levels. Pt takes Calcitriol 0.25 mcg and Calcium citrate-Vitamin D 315-200 mg BID at home. Pt reports good urine output. Family states they have not heard from nephrology yet.     Review of Systems General: Denies fever, chills, night sweats, changes in weight, changes in appetite HEENT: Denies headaches, ear pain, changes in vision, rhinorrhea, sore throat CV: Denies CP, palpitations, SOB, orthopnea Pulm: Denies SOB, cough, wheezing GI: Denies abdominal pain, nausea, vomiting, diarrhea, constipation, melena, hematochezia GU: Denies dysuria, hematuria, frequency Msk: Denies muscle cramps Neuro: Denies weakness, numbness, tingling Skin: Denies rashes, bruising    Objective:   Physical Exam General: appears stated age, sitting up in chair, NAD HEENT: [place]/AT,  EOMI, PERRL, sclera anicteric, pharynx non-erythematous, mucus membranes moist Neck: supple, no JVD, no lymphadenopathy CV: RRR, normal S1/S2, no m/g/r Pulm: CTA bilaterally, breaths non-labored, no wheezing Abd: BS+, soft, non-distended, non-tender Ext: warm, no edema. Right index finger appears swollen compared to other fingers and left hand.  Pt has difficulty lifting left arm above 90 degrees. She has tenderness with external and internal rotation of her left arm.  Neuro: alert and oriented x 3, CNs II-XII intact, strength 5/5 in upper and lower extremities bilaterally  Procedure Note Procedure: Steroid injection of left shoulder Time: 3:25 PM  Pt was prepped and draped appropriately. Iodine antiseptic was applied to left shoulder. 1 cc of 1% lidocaine and 1 cc of kenalog 40 mg/ml was injected into the left shoulder. Clean bandage was applied. Patient tolerated procedure well.     Assessment & Plan:

## 2013-12-25 NOTE — Patient Instructions (Signed)
It was a pleasure taking care of you today, Ms. Jose.  1. Shoulder Pain - Injection today - Can take Tylenol 1000 mg every 8 hours as needed  2. Chronic Kidney Disease - Referral to nephrology - Please call clinic if you do not hear from them in 1 week.   General Instructions:   Please bring your medicines with you each time you come to clinic.  Medicines may include prescription medications, over-the-counter medications, herbal remedies, eye drops, vitamins, or other pills.   Progress Toward Treatment Goals:  Treatment Goal 12/10/2013  Hemoglobin A1C improved  Blood pressure at goal  Prevent falls -    Self Care Goals & Plans:  Self Care Goal 08/13/2013  Manage my medications take my medicines as prescribed; bring my medications to every visit; refill my medications on time  Monitor my health keep track of my blood pressure; keep track of my weight; bring my blood pressure log to each visit; keep track of my blood glucose; bring my glucose meter and log to each visit; check my feet daily  Eat healthy foods drink diet soda or water instead of juice or soda; eat more vegetables; eat foods that are low in salt; eat baked foods instead of fried foods; eat fruit for snacks and desserts; eat smaller portions  Be physically active find an activity I enjoy  Meeting treatment goals maintain the current self-care plan    Home Blood Glucose Monitoring 08/13/2013  Check my blood sugar 3 times a day  When to check my blood sugar before meals     Care Management & Community Referrals:  Referral 08/13/2013  Referrals made for care management support none needed  Referrals made to community resources none

## 2013-12-25 NOTE — Assessment & Plan Note (Signed)
X-ray on 12/10/13 revealed no fractures, just evidence of osteoarthritis.  - Recommended to take Tylenol 1000 mg Q8H as needed for pain

## 2013-12-25 NOTE — Assessment & Plan Note (Signed)
-   Pt received steroid injection today - Pt prescribed Tylenol 1000 mg Q8H as needed

## 2013-12-25 NOTE — Assessment & Plan Note (Signed)
PTH elevated at 150.7. Cr worsening from 4 months ago, now 2.57. - Referral to nephrology made at last visit on 8/10, awaiting appointment - Increase Calcitriol to 0.50 mcg daily - Continue calcium citrate and vitamin D - Repeat PTH, vitamin D levels in 2 weeks

## 2013-12-26 LAB — GLUCOSE, CAPILLARY: GLUCOSE-CAPILLARY: 289 mg/dL — AB (ref 70–99)

## 2013-12-26 NOTE — Progress Notes (Signed)
I saw and evaluated the patient.  I personally confirmed the key portions of Dr. Larena Sox history and exam and reviewed pertinent patient test results.  The assessment, diagnosis, and plan were formulated together and I agree with the documentation in the resident's note.  I was present for the entire procedure.  A time out was performed and patient confirmed the correct site for the injection.  She had minimal relief after 3 minutes, but tolerated the procedure very well.

## 2014-01-01 ENCOUNTER — Other Ambulatory Visit: Payer: Self-pay | Admitting: Internal Medicine

## 2014-01-08 ENCOUNTER — Other Ambulatory Visit: Payer: Self-pay | Admitting: Internal Medicine

## 2014-02-13 ENCOUNTER — Telehealth: Payer: Self-pay | Admitting: *Deleted

## 2014-02-13 NOTE — Telephone Encounter (Signed)
Thank you Gayle 

## 2014-02-13 NOTE — Telephone Encounter (Signed)
Pt's daughter called asking for appointment. Pt is c/o burning with urination for 3 days and is not feeling well. Age - [age] I offered an appointment for tomorrow morning and they can not get her here until after lunch.  Our next appointment is Monday and I don't feel it's good to wait that long. [facility]. Daughter agreeds

## 2014-02-14 ENCOUNTER — Encounter: Payer: Medicare Other | Admitting: Internal Medicine

## 2014-02-14 ENCOUNTER — Ambulatory Visit (INDEPENDENT_AMBULATORY_CARE_PROVIDER_SITE_OTHER): Payer: Medicare Other | Admitting: Internal Medicine

## 2014-02-14 ENCOUNTER — Encounter: Payer: Self-pay | Admitting: Internal Medicine

## 2014-02-14 VITALS — BP 115/63 | HR 73 | Temp 97.5°F | Ht 60.0 in | Wt 109.1 lb

## 2014-02-14 DIAGNOSIS — I2510 Atherosclerotic heart disease of native coronary artery without angina pectoris: Secondary | ICD-10-CM

## 2014-02-14 DIAGNOSIS — N184 Chronic kidney disease, stage 4 (severe): Secondary | ICD-10-CM

## 2014-02-14 DIAGNOSIS — E1122 Type 2 diabetes mellitus with diabetic chronic kidney disease: Secondary | ICD-10-CM

## 2014-02-14 DIAGNOSIS — R300 Dysuria: Secondary | ICD-10-CM

## 2014-02-14 LAB — BASIC METABOLIC PANEL WITH GFR
BUN: 33 mg/dL — AB (ref 6–23)
CO2: 24 mEq/L (ref 19–32)
CREATININE: 2.95 mg/dL — AB (ref 0.50–1.10)
Calcium: 8.5 mg/dL (ref 8.4–10.5)
Chloride: 101 mEq/L (ref 96–112)
GFR, EST AFRICAN AMERICAN: 17 mL/min — AB
GFR, Est Non African American: 14 mL/min — ABNORMAL LOW
Glucose, Bld: 378 mg/dL — ABNORMAL HIGH (ref 70–99)
POTASSIUM: 4 meq/L (ref 3.5–5.3)
Sodium: 137 mEq/L (ref 135–145)

## 2014-02-14 LAB — CBC WITH DIFFERENTIAL/PLATELET
BASOS PCT: 0 % (ref 0–1)
Basophils Absolute: 0 10*3/uL (ref 0.0–0.1)
EOS ABS: 0 10*3/uL (ref 0.0–0.7)
EOS PCT: 1 % (ref 0–5)
HEMATOCRIT: 30.9 % — AB (ref 36.0–46.0)
Hemoglobin: 10.5 g/dL — ABNORMAL LOW (ref 12.0–15.0)
Lymphocytes Relative: 13 % (ref 12–46)
Lymphs Abs: 0.5 10*3/uL — ABNORMAL LOW (ref 0.7–4.0)
MCH: 30.3 pg (ref 26.0–34.0)
MCHC: 34 g/dL (ref 30.0–36.0)
MCV: 89.3 fL (ref 78.0–100.0)
MONO ABS: 0.4 10*3/uL (ref 0.1–1.0)
Monocytes Relative: 9 % (ref 3–12)
NEUTROS ABS: 3.2 10*3/uL (ref 1.7–7.7)
Neutrophils Relative %: 77 % (ref 43–77)
Platelets: 121 10*3/uL — ABNORMAL LOW (ref 150–400)
RBC: 3.46 MIL/uL — ABNORMAL LOW (ref 3.87–5.11)
RDW: 14 % (ref 11.5–15.5)
WBC: 4.2 10*3/uL (ref 4.0–10.5)

## 2014-02-14 LAB — GLUCOSE, CAPILLARY: Glucose-Capillary: 344 mg/dL — ABNORMAL HIGH (ref 70–99)

## 2014-02-14 MED ORDER — FOSFOMYCIN TROMETHAMINE 3 G PO PACK
3.0000 g | PACK | Freq: Once | ORAL | Status: AC
  Administered 2014-02-14: 3 g via ORAL

## 2014-02-14 NOTE — Assessment & Plan Note (Addendum)
Patient w/ 3 days of urinary symptoms including flank pain, suprapubic tenderness, fatigue, and mild nausea. No fever. UA positive for nitrites and small Hb. No leukocytosis on CBC. Multiple previous UTI's w/ culture positive for E. Coli, found to be resistant to Cipro and Levaquin. Given renal dysfunction, Macrobid and Bactrim are contraindicated.  -Will give Fosfomycin 3 g in the clinic today. -Patient to follow up in 1 week. Instructed to call sooner if symptoms worsen or fail to resolve.  -Urine culture pending

## 2014-02-14 NOTE — Progress Notes (Signed)
Subjective:   Patient ID: Bianca [name] female   DOB: Jan 10, 1931 80 y.o.   MRN: [id number]  HPI: [name] is a 80 y.o. female w/ PMHx of HTN, HLD, DM type II, Hep B, CKD, CAD s/p CABG and spinal stenosis s/p decompression, presents to the clinic today for a follow-up visit. The patient is accompanied by her son and an interpreter. Today she complains of burning w/ urination which she says has been present for about 3 days, accompanied by mild bilateral flank pain and mild suprapubic pain. She also admits to recent fatigue and some nausea over the same period of time. She denies vomiting, fever, chills, or decreased appetite. The patient has had multiple UTI's in the past, culture positive for E. Coli, resistant to Cipro and Levaquin. She says it has been several months since her last UTI (per chart review, since 08/2013).  The patient also has significant CKD stage 5, which has appeared to be worsening. During her most recent visit, she was referred to nephrology, however, this referral has yet to go through. Most recent Cr 2.57 on 12/10/13, now 2.95.   Past Medical History  Diagnosis Date  . Diabetes mellitus, type 2   . GERD (gastroesophageal reflux disease)   . Hepatitis B   . Hyperlipemia   . Hypertension   . Cervical dysplasia   . Atrophic vaginitis   . Spinal stenosis     s/p decompression  . CAD (coronary artery disease) April 2008    s/p CABG;    cath 8/12:  LM patent, pLAD occluded, OM1 30-40%, pOM2 80% and 60% after the anastomosis, pRCA occluded, S-DX occluded (old), S-OM 2 occluded (new), L-LAD patent, dLAD provided collats to the PDA; Med Rx  rec.; consider PCI to OM2 if fails med Rx  . Hx of hysterectomy   . UTI (lower urinary tract infection)    Current Outpatient Prescriptions  Medication Sig Dispense Refill  . acetaminophen (TYLENOL) 500 MG tablet Take 1 tablet (500 mg total) by mouth every 6 (six) hours as needed.  100 tablet  0  . acetaminophen (TYLENOL) 500 MG  tablet Take 2 tablets (1,000 mg total) by mouth every 8 (eight) hours as needed for mild pain.  100 tablet  2  . amLODipine (NORVASC) 5 MG tablet Take 1 tablet (5 mg total) by mouth daily.  30 tablet  11  . aspirin EC 81 MG tablet TAKE 1 TABLET BY MOUTH EVERY DAY  150 tablet  1  . B-D ULTRAFINE III SHORT PEN 31G X 8 MM MISC USE AS DIRECTED TO INJECT INSULIN 3 TIMES A DAY  100 each  11  . BAYER CONTOUR NEXT TEST test strip TEST BLOOD SUGAR UP TO 5 TIMES A DAY  100 each  11  . calcitRIOL (ROCALTROL) 0.25 MCG capsule Take 2 capsules (0.5 mcg total) by mouth daily.  60 capsule  5  . calcium citrate-vitamin D (CITRACAL+D) 315-200 MG-UNIT per tablet Take 2 tablets by mouth 2 (two) times daily.  100 tablet  3  . carvedilol (COREG) 25 MG tablet TAKE 1 TABLET (25 MG TOTAL) BY MOUTH 2 (TWO) TIMES DAILY WITH A MEAL.  120 tablet  3  . CRESTOR 20 MG tablet TAKE 1 TABLET (20 MG TOTAL) BY MOUTH DAILY.  90 tablet  3  . docusate sodium (COLACE) 100 MG capsule Take 1 capsule (100 mg total) by mouth daily as needed for mild constipation.  30 capsule  1  .  famotidine (PEPCID) 20 MG tablet TAKE 1 TABLET BY MOUTH TWICE A DAY  60 tablet  3  . ferrous sulfate 325 (65 FE) MG tablet TAKE 1 TABLET BY MOUTH 3 TIMES DAILY WITH MEALS.  90 tablet  5  . hydrALAZINE (APRESOLINE) 25 MG tablet Take 1 tablet (25 mg total) by mouth 3 (three) times daily.  270 tablet  5  . isosorbide mononitrate (IMDUR) 30 MG 24 hr tablet TAKE 1 TABLET (30 MG TOTAL) BY MOUTH DAILY.  90 tablet  3  . LANTUS 100 UNIT/ML injection INJECT 0.2 MLS (20 UNITS TOTAL) INTO THE SKIN AT BEDTIME.  10 mL  5  . nitroGLYCERIN (NITROSTAT) 0.4 MG SL tablet Place 0.4 mg under the tongue every 5 (five) minutes as needed. For chest pain. Maximum of 3 doses      . NOVOLOG FLEXPEN 100 UNIT/ML FlexPen INJECT 8 UNITS INTO THE SKIN THREE TIMES DAILY BEFORE MEALS.  100 mL  9  . pantoprazole (PROTONIX) 20 MG tablet Take 2 tablets (40 mg total) by mouth daily.  30 tablet  1  .  PROAIR HFA 108 (90 BASE) MCG/ACT inhaler INHALE 1 PUFF EVERY 6 HOURS AS NEEDED FOR WHEEZE  8.5 each  2  . sennosides-docusate sodium (SENOKOT-S) 8.6-50 MG tablet Take 1 tablet by mouth daily.  90 tablet  3  . traMADol (ULTRAM) 50 MG tablet Take 1 tablet (50 mg total) by mouth every 6 (six) hours as needed for moderate pain.  20 tablet  0   No current facility-administered medications for this visit.    Review of Systems: General: Positive for fatigue. Denies fever, chills, diaphoresis, appetite change.  Respiratory: Denies SOB, DOE, cough, and wheezing.   Cardiovascular: Denies chest pain and palpitations.  Gastrointestinal: Positive for nausea. Denies vomiting, abdominal pain, and diarrhea.  Genitourinary: Positive for dysuria, flank pain, and suprapubic pain. Endocrine: Denies hot or cold intolerance, polyuria, and polydipsia. Musculoskeletal: Denies myalgias, back pain, joint swelling, arthralgias and gait problem.  Skin: Denies pallor, rash and wounds.  Neurological: Denies dizziness, seizures, syncope, weakness, lightheadedness, numbness and headaches.  Psychiatric/Behavioral: Denies mood changes, and sleep disturbances.   Objective:   Physical Exam: Filed Vitals:   02/14/14 1513  BP: 115/63  Pulse: 73  Temp: 97.5 F (36.4 C)  TempSrc: Oral  Height: 5' (1.524 m)  Weight: 109 lb 1.6 oz (49.487 kg)  SpO2: 99%    General: Elderly Khmer speaking female, alert, cooperative, NAD. HEENT: PERRL, EOMI. Moist mucus membranes Neck: Full range of motion without pain, supple, no lymphadenopathy or carotid bruits Lungs: Clear to ascultation bilaterally, normal work of respiration, no wheezes, rales, rhonchi Heart: RRR, no murmurs, gallops, or rubs Abdomen: Soft, suprapubic tenderness, non-distended, BS +. Only mild CVA tenderness.  Extremities: No cyanosis, clubbing, or edema Neurologic: Alert & oriented X3, cranial nerves II-XII intact, strength grossly intact, sensation intact to  light touch   Assessment & Plan:   Please see problem based assessment and plan.

## 2014-02-14 NOTE — Patient Instructions (Signed)
General Instructions:  1. Please schedule a follow up appointment for 1 week.  2. Please take all medications as prescribed.   3. If you have worsening of your symptoms or new symptoms arise, please call the clinic [phone]), or go to the ER immediately if symptoms are severe.  You have done a great job in taking all your medications. I appreciate it very much. Please continue doing that.   Please bring your medicines with you each time you come to clinic.  Medicines may include prescription medications, over-the-counter medications, herbal remedies, eye drops, vitamins, or other pills.   Progress Toward Treatment Goals:  Treatment Goal 12/10/2013  Hemoglobin A1C improved  Blood pressure at goal  Prevent falls -    Self Care Goals & Plans:  Self Care Goal 02/14/2014  Manage my medications take my medicines as prescribed; bring my medications to every visit; refill my medications on time  Monitor my health keep track of my blood glucose; bring my glucose meter and log to each visit  Eat healthy foods drink diet soda or water instead of juice or soda; eat more vegetables; eat foods that are low in salt; eat baked foods instead of fried foods; eat fruit for snacks and desserts  Be physically active -  Meeting treatment goals -    Home Blood Glucose Monitoring 08/13/2013  Check my blood sugar 3 times a day  When to check my blood sugar before meals     Care Management & Community Referrals:  Referral 08/13/2013  Referrals made for care management support none needed  Referrals made to community resources none

## 2014-02-15 ENCOUNTER — Encounter: Payer: Self-pay | Admitting: Internal Medicine

## 2014-02-15 LAB — URINALYSIS, MICROSCOPIC ONLY
CRYSTALS: NONE SEEN
Casts: NONE SEEN

## 2014-02-15 LAB — URINALYSIS, ROUTINE W REFLEX MICROSCOPIC
Bilirubin Urine: NEGATIVE
Glucose, UA: >1000 mg/dL — AB
Ketones, ur: NEGATIVE mg/dL
Leukocytes, UA: NEGATIVE
NITRITE: POSITIVE — AB
PH: 5 (ref 5.0–8.0)
Protein, ur: >300 mg/dL — AB
SPECIFIC GRAVITY, URINE: 1.023 (ref 1.005–1.030)
Urobilinogen, UA: 0.2 mg/dL (ref 0.0–1.0)

## 2014-02-15 NOTE — Assessment & Plan Note (Signed)
Referral to nephrology still pending. Inquired about this today, have placed referral as urgent given rapid progression of CKD.  -Repeat BMP today shows Cr of 2.95, w/ GFR of 14. No electrolyte abnormalities.  -Patient to return in 1 week for follow up regarding her dysuria.

## 2014-02-15 NOTE — Assessment & Plan Note (Signed)
CBG 344 today. Patient ate a large serving of white rice prior to her clinic visit. Son claims she is complaint w/ her Lantus 20 units qhs and Novolog 8 units tid (did not take Novolog prior to visit).  -Advised tight glycemic control, especially in the setting of multiple urinary tract infections. -Discussed diet at length.

## 2014-02-17 LAB — URINE CULTURE: Colony Count: >=100000

## 2014-02-21 ENCOUNTER — Encounter: Payer: Self-pay | Admitting: Internal Medicine

## 2014-02-21 ENCOUNTER — Ambulatory Visit (INDEPENDENT_AMBULATORY_CARE_PROVIDER_SITE_OTHER): Payer: Medicare Other | Admitting: Internal Medicine

## 2014-02-21 VITALS — BP 144/67 | HR 72 | Temp 98.3°F | Ht 60.0 in | Wt 111.5 lb

## 2014-02-21 DIAGNOSIS — B9620 Unspecified Escherichia coli [E. coli] as the cause of diseases classified elsewhere: Secondary | ICD-10-CM

## 2014-02-21 DIAGNOSIS — Z0000 Encounter for general adult medical examination without abnormal findings: Secondary | ICD-10-CM

## 2014-02-21 DIAGNOSIS — E1122 Type 2 diabetes mellitus with diabetic chronic kidney disease: Secondary | ICD-10-CM

## 2014-02-21 DIAGNOSIS — N390 Urinary tract infection, site not specified: Secondary | ICD-10-CM

## 2014-02-21 DIAGNOSIS — N184 Chronic kidney disease, stage 4 (severe): Secondary | ICD-10-CM

## 2014-02-21 DIAGNOSIS — I2510 Atherosclerotic heart disease of native coronary artery without angina pectoris: Secondary | ICD-10-CM

## 2014-02-21 LAB — BASIC METABOLIC PANEL WITH GFR
BUN: 35 mg/dL — ABNORMAL HIGH (ref 6–23)
CO2: 23 mEq/L (ref 19–32)
CREATININE: 2.76 mg/dL — AB (ref 0.50–1.10)
Calcium: 8.9 mg/dL (ref 8.4–10.5)
Chloride: 104 mEq/L (ref 96–112)
GFR, Est African American: 18 mL/min — ABNORMAL LOW
GFR, Est Non African American: 16 mL/min — ABNORMAL LOW
Glucose, Bld: 280 mg/dL — ABNORMAL HIGH (ref 70–99)
Potassium: 4.2 mEq/L (ref 3.5–5.3)
Sodium: 136 mEq/L (ref 135–145)

## 2014-02-21 LAB — POCT URINALYSIS DIPSTICK
BILIRUBIN UA: NEGATIVE
Glucose, UA: 500
Ketones, UA: NEGATIVE
LEUKOCYTES UA: NEGATIVE
NITRITE UA: NEGATIVE
PH UA: 7
Spec Grav, UA: 1.02
Urobilinogen, UA: 0.2

## 2014-02-21 LAB — GLUCOSE, CAPILLARY: Glucose-Capillary: 299 mg/dL — ABNORMAL HIGH (ref 70–99)

## 2014-02-21 MED ORDER — AMOXICILLIN-POT CLAVULANATE 500-125 MG PO TABS: 500.0000 mg | ORAL_TABLET | Freq: Two times a day (BID) | ORAL | Status: DC

## 2014-02-21 NOTE — Assessment & Plan Note (Addendum)
Denies any NSAID use.  No vomiting or diarrhea.  Has been eating and drinking normally. Not hypotensive, BP 144/67. No ACE/ARB, diuretic and no recent contrast.  -recheck BMP today -still waiting on referral to nephrology

## 2014-02-21 NOTE — Progress Notes (Signed)
Patient ID: Bianca [name], female   DOB: 02-Mar-1931, 80 y.o. MRN: [id number]    Subjective:   Patient ID: Bianca [name] female    DOB: Oct 24, 1930 80 y.o.    MRN: [id number] Health Maintenance Due: Health Maintenance Due  Topic Date Due  . Tetanus/tdap  02/15/1952  . Colonoscopy  02/15/1983  . Zostavax  02/14/1993  . Ophthalmology Exam  08/27/2011    _________________________________________________  HPI: [name] is a 80 y.o. female here for a follow up visit.  Pt has a PMH outlined below.  Please see problem-based charting assessment and plan note for further details of medical issues addressed at today's visit.  PMH: Past Medical History  Diagnosis Date  . Diabetes mellitus, type 2   . GERD (gastroesophageal reflux disease)   . Hepatitis B   . Hyperlipemia   . Hypertension   . Cervical dysplasia   . Atrophic vaginitis   . Spinal stenosis     s/p decompression  . CAD (coronary artery disease) April 2008    s/p CABG;    cath 8/12:  LM patent, pLAD occluded, OM1 30-40%, pOM2 80% and 60% after the anastomosis, pRCA occluded, S-DX occluded (old), S-OM 2 occluded (new), L-LAD patent, dLAD provided collats to the PDA; Med Rx  rec.; consider PCI to OM2 if fails med Rx  . Hx of hysterectomy   . UTI (lower urinary tract infection)     Medications: Current Outpatient Prescriptions on File Prior to Visit  Medication Sig Dispense Refill  . acetaminophen (TYLENOL) 500 MG tablet Take 1 tablet (500 mg total) by mouth every 6 (six) hours as needed.  100 tablet  0  . acetaminophen (TYLENOL) 500 MG tablet Take 2 tablets (1,000 mg total) by mouth every 8 (eight) hours as needed for mild pain.  100 tablet  2  . amLODipine (NORVASC) 5 MG tablet Take 1 tablet (5 mg total) by mouth daily.  30 tablet  11  . aspirin EC 81 MG tablet TAKE 1 TABLET BY MOUTH EVERY DAY  150 tablet  1  . B-D ULTRAFINE III SHORT PEN 31G X 8 MM MISC USE AS DIRECTED TO INJECT INSULIN 3 TIMES A DAY  100 each  11  .  BAYER CONTOUR NEXT TEST test strip TEST BLOOD SUGAR UP TO 5 TIMES A DAY  100 each  11  . calcitRIOL (ROCALTROL) 0.25 MCG capsule Take 2 capsules (0.5 mcg total) by mouth daily.  60 capsule  5  . calcium citrate-vitamin D (CITRACAL+D) 315-200 MG-UNIT per tablet Take 2 tablets by mouth 2 (two) times daily.  100 tablet  3  . carvedilol (COREG) 25 MG tablet TAKE 1 TABLET (25 MG TOTAL) BY MOUTH 2 (TWO) TIMES DAILY WITH A MEAL.  120 tablet  3  . CRESTOR 20 MG tablet TAKE 1 TABLET (20 MG TOTAL) BY MOUTH DAILY.  90 tablet  3  . docusate sodium (COLACE) 100 MG capsule Take 1 capsule (100 mg total) by mouth daily as needed for mild constipation.  30 capsule  1  . famotidine (PEPCID) 20 MG tablet TAKE 1 TABLET BY MOUTH TWICE A DAY  60 tablet  3  . ferrous sulfate 325 (65 FE) MG tablet TAKE 1 TABLET BY MOUTH 3 TIMES DAILY WITH MEALS.  90 tablet  5  . hydrALAZINE (APRESOLINE) 25 MG tablet Take 1 tablet (25 mg total) by mouth 3 (three) times daily.  270 tablet  5  . isosorbide mononitrate (IMDUR) 30  MG 24 hr tablet TAKE 1 TABLET (30 MG TOTAL) BY MOUTH DAILY.  90 tablet  3  . LANTUS 100 UNIT/ML injection INJECT 0.2 MLS (20 UNITS TOTAL) INTO THE SKIN AT BEDTIME.  10 mL  5  . nitroGLYCERIN (NITROSTAT) 0.4 MG SL tablet Place 0.4 mg under the tongue every 5 (five) minutes as needed. For chest pain. Maximum of 3 doses      . NOVOLOG FLEXPEN 100 UNIT/ML FlexPen INJECT 8 UNITS INTO THE SKIN THREE TIMES DAILY BEFORE MEALS.  100 mL  9  . pantoprazole (PROTONIX) 20 MG tablet Take 2 tablets (40 mg total) by mouth daily.  30 tablet  1  . PROAIR HFA 108 (90 BASE) MCG/ACT inhaler INHALE 1 PUFF EVERY 6 HOURS AS NEEDED FOR WHEEZE  8.5 each  2  . sennosides-docusate sodium (SENOKOT-S) 8.6-50 MG tablet Take 1 tablet by mouth daily.  90 tablet  3  . traMADol (ULTRAM) 50 MG tablet Take 1 tablet (50 mg total) by mouth every 6 (six) hours as needed for moderate pain.  20 tablet  0   No current facility-administered medications on file  prior to visit.    Allergies: Allergies  Allergen Reactions  . Cozaar Other (See Comments)    Sig increase in her creatinine that resolved once it was D/C'd  Per Cr visible in epic, it seems her baseline Cr = 1.7-1.9, and the slight improvement to Cr was an outlier when compared to recent Cr.  We will do a trial of Lisinopril (and monitor for 30% rise in Cr), as she would greatly benefit from renal protection.  . Lisinopril     Cough     FH: No family history on file.  SH: History   Social History  . Marital Status: Widowed    Spouse Name: N/A    Number of Children: N/A  . Years of Education: N/A   Social History Main Topics  . Smoking status: Never Smoker   . Smokeless tobacco: None  . Alcohol Use: No  . Drug Use: No  . Sexual Activity: None   Other Topics Concern  . None   Social History Narrative   [place] immigrant   Housewife   Widowed   Lives w/ her son    Review of Systems: Constitutional: Negative for fever, +chills and -weight loss.  Eyes: Negative for blurred vision.  Respiratory: Negative for cough and shortness of breath.  Cardiovascular: Negative for chest pain, palpitations and leg swelling.  Gastrointestinal: Negative for nausea, vomiting, abdominal pain, diarrhea, +constipation and - blood in stool.  Genitourinary: +dysuria, urgency and frequency.  Musculoskeletal: Negative for myalgias and back pain.  Neurological: Negative for dizziness, weakness and headaches.     Objective:   Vital Signs: Filed Vitals:   02/21/14 1422  BP: 144/67  Pulse: 72  Temp: 98.3 F (36.8 C)  TempSrc: Oral  Height: 5' (1.524 m)  Weight: 111 lb 8 oz (50.576 kg)  SpO2: 100%      BP Readings from Last 3 Encounters:  02/21/14 144/67  02/14/14 115/63  12/25/13 117/63    Physical Exam: Constitutional: Vital signs reviewed.  Patient is well-developed and well-nourished in NAD and cooperative with exam.  Head: Normocephalic and atraumatic. Eyes: PERRL,  EOMI, conjunctivae nl, no scleral icterus.  Neck: Supple. Cardiovascular: RRR, no MRG. Pulmonary/Chest: normal effort, non-tender to palpation, CTAB, no wheezes, rales, or rhonchi. Abdominal: Soft. NT/ND +BS. Neurological: A&O x3, cranial nerves II-XII are grossly intact, moving all extremities.  Extremities: 2+DP b/l; no pitting edema. Skin: Warm, dry and intact. No rash.  Assessment & Plan:   Assessment and plan was discussed and formulated with my attending.

## 2014-02-21 NOTE — Patient Instructions (Signed)
Thank you for your visit today.   Please return to the internal medicine clinic in 1 month(s) or sooner if needed.   I will send you an antibiotic to your pharmacy to take for 3 days since you are still having symptoms.  Please take as directed.  We will check your labs today.   Please try to take colace twice daily for constipation.   Please continue to monitor your blood sugars daily.   Please be sure to bring all of your medications with you to every visit; this includes herbal supplements, vitamins, eye drops, and any over-the-counter medications.   Should you have any questions regarding your medications and/or any new or worsening symptoms, please be sure to call the clinic at [phone].   If you believe that you are suffering from a life threatening condition or one that may result in the loss of limb or function, then you should call 911 or proceed to the nearest Emergency Department.

## 2014-02-21 NOTE — Assessment & Plan Note (Addendum)
Cbg today was 299.  Last Ha1c was 7.7 which is at goal for patient's age.   -continue current regimen -would discourage tight glycemic control given patient's age and increased risk of mortality

## 2014-02-21 NOTE — Assessment & Plan Note (Addendum)
Pt is still complaining of burning with urination and has mild suprapubic tenderness.  Also reports constipation with a small, hard BM yesterday, which seems to be a chronic problem.  States that she has some chills at times but no fever.  Also mild nausea but no vomiting.  UA had no nitrites and leukocytes.  She was given a dose of fosfomycin in the clinic during last OV for UTI.  Urine cx at that time grew E.coli.  -cannot use pyridium due to CKD -would also continue to take colace twice daily and senokot for constipation -return to clinic in 1 month for a recheck

## 2014-02-22 NOTE — Assessment & Plan Note (Signed)
-  declined influenza vaccine today 

## 2014-02-22 NOTE — Progress Notes (Signed)
Internal Medicine Clinic Attending  Case discussed with Dr. Gill soon after the resident saw the patient.  We reviewed the resident's history and exam and pertinent patient test results.  I agree with the assessment, diagnosis, and plan of care documented in the resident's note.  

## 2014-02-23 NOTE — Progress Notes (Signed)
Internal Medicine Clinic Attending  Case discussed with Dr. Hoffman soon after the resident saw the patient.  We reviewed the resident's history and exam and pertinent patient test results.  I agree with the assessment, diagnosis, and plan of care documented in the resident's note. 

## 2014-03-11 ENCOUNTER — Other Ambulatory Visit: Payer: Self-pay | Admitting: Internal Medicine

## 2014-03-12 ENCOUNTER — Other Ambulatory Visit: Payer: Self-pay | Admitting: Nephrology

## 2014-03-12 DIAGNOSIS — N184 Chronic kidney disease, stage 4 (severe): Secondary | ICD-10-CM

## 2014-03-19 ENCOUNTER — Other Ambulatory Visit: Payer: Medicare Other

## 2014-03-19 ENCOUNTER — Encounter: Payer: Medicare Other | Admitting: Internal Medicine

## 2014-03-20 ENCOUNTER — Other Ambulatory Visit: Payer: Medicare Other

## 2014-03-25 ENCOUNTER — Ambulatory Visit
Admission: RE | Admit: 2014-03-25 | Discharge: 2014-03-25 | Disposition: A | Discharge status: Home / Self Care | Payer: Medicare Other | Source: Ambulatory Visit | Attending: Nephrology | Admitting: Nephrology

## 2014-03-25 DIAGNOSIS — N184 Chronic kidney disease, stage 4 (severe): Secondary | ICD-10-CM

## 2014-04-02 ENCOUNTER — Ambulatory Visit (INDEPENDENT_AMBULATORY_CARE_PROVIDER_SITE_OTHER): Payer: Medicare Other | Admitting: Internal Medicine

## 2014-04-02 ENCOUNTER — Encounter: Payer: Self-pay | Admitting: Internal Medicine

## 2014-04-02 VITALS — BP 128/85 | HR 72 | Temp 97.7°F | Ht 60.0 in | Wt 109.2 lb

## 2014-04-02 DIAGNOSIS — I2510 Atherosclerotic heart disease of native coronary artery without angina pectoris: Secondary | ICD-10-CM

## 2014-04-02 DIAGNOSIS — N184 Chronic kidney disease, stage 4 (severe): Secondary | ICD-10-CM

## 2014-04-02 DIAGNOSIS — N390 Urinary tract infection, site not specified: Secondary | ICD-10-CM

## 2014-04-02 DIAGNOSIS — B9620 Unspecified Escherichia coli [E. coli] as the cause of diseases classified elsewhere: Secondary | ICD-10-CM

## 2014-04-02 NOTE — Assessment & Plan Note (Addendum)
Assessment: Patient had a previous diagnosis of an E. Coli UTI on 02/14/14 for which she was treated with Fosfomycin 3 grams in the clinic. Patient returned for follow up on 10/22 and her urinalysis was clear of infection; however, patient continued to have dysuria and suprapubic pain. She returned today for follow up and her symptoms are much improved. She does admit to occasional intermittent dysuria, but denies fever, chills, nausea, vomiting, suprapubic pain, flank pain, polyuria. Patient was seen by [facility] on 03/07/14 for lab work. The patient states they did not see her, only drew labs. I cannot find an actual note from the office visit, only lab work. Lab work revealed CKD stage IV secondary to DM, and Renal US only showed simple cysts and chronic medical disease. However, UTI was noted and was though to be chronic in nature.  Plan: -Urinalysis -Urine Culture

## 2014-04-02 NOTE — Assessment & Plan Note (Signed)
Assessment: Patient was seen by [facility] on 03/07/14 for lab work. The patient states they did not see her, only drew labs. I cannot find an actual note from the office visit, only lab work. Lab work revealed CKD stage IV secondary to DM, and Renal US only showed simple cysts and chronic medical disease.   Plan:  -Patient's family will call [facility] to check to see when her next appointment is for follow up.  -Continue to hold ACEI

## 2014-04-02 NOTE — Patient Instructions (Signed)
General Instructions:   Please bring your medicines with you each time you come to clinic.  Medicines may include prescription medications, over-the-counter medications, herbal remedies, eye drops, vitamins, or other pills.  Urinary Tract Infection Urinary tract infections (UTIs) can develop anywhere along your urinary tract. Your urinary tract is your body's drainage system for removing wastes and extra water. Your urinary tract includes two kidneys, two ureters, a bladder, and a urethra. Your kidneys are a pair of bean-shaped organs. Each kidney is about the size of your fist. They are located below your ribs, one on each side of your spine. CAUSES Infections are caused by microbes, which are microscopic organisms, including fungi, viruses, and bacteria. These organisms are so small that they can only be seen through a microscope. Bacteria are the microbes that most commonly cause UTIs. SYMPTOMS  Symptoms of UTIs may vary by age and gender of the patient and by the location of the infection. Symptoms in young women typically include a frequent and intense urge to urinate and a painful, burning feeling in the bladder or urethra during urination. Older women and men are more likely to be tired, shaky, and weak and have muscle aches and abdominal pain. A fever may mean the infection is in your kidneys. Other symptoms of a kidney infection include pain in your back or sides below the ribs, nausea, and vomiting. DIAGNOSIS To diagnose a UTI, your caregiver will ask you about your symptoms. Your caregiver also will ask to provide a urine sample. The urine sample will be tested for bacteria and white blood cells. White blood cells are made by your body to help fight infection. TREATMENT  Typically, UTIs can be treated with medication. Because most UTIs are caused by a bacterial infection, they usually can be treated with the use of antibiotics. The choice of antibiotic and length of treatment depend on your  symptoms and the type of bacteria causing your infection. HOME CARE INSTRUCTIONS  If you were prescribed antibiotics, take them exactly as your caregiver instructs you. Finish the medication even if you feel better after you have only taken some of the medication.  Drink enough water and fluids to keep your urine clear or pale yellow.  Avoid caffeine, tea, and carbonated beverages. They tend to irritate your bladder.  Empty your bladder often. Avoid holding urine for long periods of time.  Empty your bladder before and after sexual intercourse.  After a bowel movement, women should cleanse from front to back. Use each tissue only once. SEEK MEDICAL CARE IF:   You have back pain.  You develop a fever.  Your symptoms do not begin to resolve within 3 days. SEEK IMMEDIATE MEDICAL CARE IF:   You have severe back pain or lower abdominal pain.  You develop chills.  You have nausea or vomiting.  You have continued burning or discomfort with urination. MAKE SURE YOU:   Understand these instructions.  Will watch your condition.  Will get help right away if you are not doing well or get worse. Document Released: 01/27/2005 Document Revised: 10/19/2011 Document Reviewed: 05/28/2011 [facility] Patient Information 2015 [place], [place]. This information is not intended to replace advice given to you by your health care provider. Make sure you discuss any questions you have with your health care provider.  Diabetic Nephropathy Diabetic nephropathy is a complication of diabetes that leads to damaged kidneys. It develops slowly. The function of healthy kidneys is to filter and clean blood. Kidneys also get rid of  body waste products and extra fluid. When the kidney filters are damaged, there is protein loss in the urine, a decline in kidney function, a buildup of kidney waste products and fluid, and high blood pressure. The damage progresses until the kidneys fail.  RISK FACTORS  High blood  pressure (hypertension).  High blood sugar (hyperglycemia).  Family history.  Aging.  Obstruction problems affecting the kidneys, the tubes that drain the kidneys (ureters), or the bladder.  Taking certain drugs or medicines. SYMPTOMS  Symptoms may not be seen or felt for many years. You may not notice any signs of kidney failure until your kidneys have lost much of their ability to function. An early sign of damage is when small amounts of protein (albumin) leak into the urine. However, this can only be found through a urine test. Without physical symptoms, a urine test is often not performed. When the kidneys fail, you may feel one or more of the following:  Swelling of the hands and feet from the extra fluid in your body.  Constant upset stomach.  Constant fatigue. DIAGNOSIS When someone has diabetes, screening tests are done to look for any early signs of problems before symptoms develop and before damage has already been done. These tests may include:   Annual urine tests to screen for trace amounts of protein in the urine (microalbuminuria).  Urine collectionover 24 hours to measure kidney function.  Blood tests that measure kidney function. Your caregiver is aware that problems other than diabetes can damage kidneys. If screening tests show early kidney damage, but it is thought that a different problem is causing the damage, other tests may be performed. Examples of these tests include:  An ultrasound of your kidney.  Taking a tissue sample (biopsy) from the kidney. TREATMENT The goal of treatment is to prevent or slow down damage to your kidneys. Controlling hypertension and hyperglycemia is critical. Your goal is to maintain a blood pressure below 120/80. If you have certain other medical problems, this goal may be different. Talk to your caregiver to make sure that your blood pressure goal is right for your needs. Regular testing of your blood glucose at home is  important. Your goal is to have a normal blood glucose (110 or less when fasting) as often as possible.  In addition, maintaining your hemoglobin A1c level at less than 7% reduces your risk for complications, including kidney damage. Common treatments include:  Dieting by controlling what you eat as well as the portion sizes.  Exercising to control blood pressure and blood glucose.  Taking medicines.  Giving yourself insulin injections if your caregiver feels that it is necessary.  Getting early treatment for urinary tract infections.  Regularly following up with your caregiver. If your disease progresses to end-stage kidney failure, you will need dialysis or a transplant. Dialysis can be done in 1 of 2 ways:  Hemodialysis. Your blood flows from a tube in your arm through a machine. The machine filters waste and extra fluid. The clean blood flows back into your arm.  Peritoneal dialysis. Your abdomen is filled with a special fluid. The fluid collects waste products and extra fluid from your blood. The fluid is then drained from your abdomen and discarded. SEEK MEDICAL CARE IF:   You are having problems keeping your blood glucose in the goal range.  You have swelling of the hands or feet.  You have weakness.  You have muscles spasms.  You have a constant upset stomach.  You  feel tired all the time and this is not normal for you. SEEK IMMEDIATE MEDICAL CARE IF:  You have unusual dizziness or weakness.  You have excessive sleepiness.  You have a seizure or convulsion.  You have severe, painful muscle spasms.  You have shortness of breath or trouble breathing.  You pass out or have a fainting episode.  You have chest pains. MAKE SURE YOU:  Understand these instructions.  Will watch your condition.  Will get help right away if you are not doing well or get worse. Document Released: 05/09/2007 Document Revised: 12/20/2012 Document Reviewed: 12/09/2010 [facility]  Patient Information 2015 [place], [place]. This information is not intended to replace advice given to you by your health care provider. Make sure you discuss any questions you have with your health care provider.

## 2014-04-02 NOTE — Progress Notes (Signed)
Subjective:     Patient ID: Bianca [name], female   DOB: May 01, 1931, 80 y.o.   MRN: [id number]  HPI   Review of Systems     Objective:   Physical Exam Filed Vitals:   04/02/14 1511  BP: 128/85  Pulse: 72  Temp: 97.7 F (36.5 C)  TempSrc: Oral  Height: 5' (1.524 m)  Weight: 109 lb 3.2 oz (49.533 kg)  SpO2: 100%   General: Vital signs reviewed.  Patient is well-developed and well-nourished, in no acute distress and cooperative with exam.  Head: Normocephalic and atraumatic. Eyes: EOMI, conjunctivae normal, no scleral icterus.  Neck: Supple, trachea midline, normal ROM, no JVD, masses, thyromegaly, or carotid bruit present.  Cardiovascular: RRR, S1 normal, S2 normal, no murmurs, gallops, or rubs. Pulmonary/Chest: Clear to auscultation bilaterally, no wheezes, rales, or rhonchi. Abdominal: Soft, non-tender, non-distended, BS +, no masses, organomegaly, or guarding present.  Musculoskeletal: No joint deformities, erythema, or stiffness, ROM full and nontender. Extremities: No lower extremity edema bilaterally,  pulses symmetric and intact bilaterally. No cyanosis or clubbing. Neurological: A&O x3, Strength is normal and symmetric bilaterally, cranial nerve II-XII are grossly intact, no focal motor deficit, sensory intact to light touch bilaterally.  Skin: Warm, dry and intact. No rashes or erythema. Psychiatric: Normal mood and affect. speech and behavior is normal. Cognition and memory are normal.      Assessment:         Plan:     Please see problem based

## 2014-04-02 NOTE — Progress Notes (Signed)
Subjective:     Patient ID: Bianca [name], female   DOB: 06-Jan-1931, 80 y.o.   MRN: [id number]  HPI [name] is a 80 yo female with PMHx of HTN, CAD, GERD, Type II DM, HLD, CKD stage IV who presents to the clinic for follow up for UTI. Please see problem oriented charting for more information.  Review of Systems General: Denies fever, chills, fatigue, change in appetite and diaphoresis.  Respiratory: Denies SOB, cough, DOE, chest tightness, and wheezing.   Cardiovascular: Denies chest pain and palpitations.  Gastrointestinal: Denies nausea, vomiting, abdominal pain or suprapubic pain, diarrhea, constipation, blood in stool and abdominal distention.  Genitourinary: Admits to mild intermittent dysuria, Denies urgency, frequency, hematuria, suprapubic pain and flank pain. Endocrine: Denies hot or cold intolerance, polyuria, and polydipsia. Musculoskeletal: Denies myalgias, back pain, joint swelling, arthralgias and gait problem.  Skin: Denies pallor, rash and wounds.  Neurological: Denies dizziness, headaches, weakness, lightheadedness, numbness,seizures, and syncope, Psychiatric/Behavioral: Denies mood changes, confusion, nervousness, sleep disturbance and agitation.     Objective:   Physical Exam Filed Vitals:   04/02/14 1511  BP: 128/85  Pulse: 72  Temp: 97.7 F (36.5 C)  TempSrc: Oral  Height: 5' (1.524 m)  Weight: 109 lb 3.2 oz (49.533 kg)  SpO2: 100%   General: Vital signs reviewed.  Patient is well-developed and well-nourished, in no acute distress and  Cardiovascular: RRR, S1 normal, S2 normal, no murmurs, gallops, or rubs. Pulmonary/Chest: Clear to auscultation bilaterally, no wheezes, rales, or rhonchi. Abdominal: Soft, non-tender, non-distended, no suprapubic pain or flank pain, BS +, no masses, organomegaly, or guarding present.  Musculoskeletal: No joint deformities, erythema, or stiffness, ROM full and nontender. Extremities: No lower extremity edema bilaterally,   pulses symmetric and intact bilaterally. No cyanosis or clubbing. Skin: Warm, dry and intact. No rashes or erythema. Psychiatric: Normal mood and affect. speech and behavior is normal. Cognition and memory are normal.      Assessment:         Plan:     Please see problem based assessment and plan.

## 2014-04-04 NOTE — Progress Notes (Signed)
Internal Medicine Clinic Attending  Date of Visit: 04/02/2014  I saw and evaluated the patient at the time of the visit.  I personally confirmed the key portions of the history and exam documented by Dr. Richardson and I reviewed pertinent patient test results.  The assessment, diagnosis, and plan were formulated together and I agree with the documentation in the resident's note. 

## 2014-04-05 ENCOUNTER — Other Ambulatory Visit: Payer: Self-pay | Admitting: Internal Medicine

## 2014-04-10 ENCOUNTER — Other Ambulatory Visit: Payer: Self-pay | Admitting: Internal Medicine

## 2014-04-10 ENCOUNTER — Telehealth: Payer: Self-pay | Admitting: Internal Medicine

## 2014-04-10 DIAGNOSIS — B9620 Unspecified Escherichia coli [E. coli] as the cause of diseases classified elsewhere: Secondary | ICD-10-CM

## 2014-04-10 DIAGNOSIS — N390 Urinary tract infection, site not specified: Principal | ICD-10-CM

## 2014-04-10 NOTE — Telephone Encounter (Signed)
I called patient's daughter who is her mother's caregiver and translator. Her mother is no longer complaining of dysuria. Given history of E. Coli UTI and chronic UTI, I recommended that patient leave a urine sample with our clinic for testing and culture. I have placed a future order for UA and culture if the patient comes to the clinic.

## 2014-04-20 ENCOUNTER — Emergency Department (HOSPITAL_COMMUNITY): Payer: Medicare Other

## 2014-04-20 ENCOUNTER — Encounter (HOSPITAL_COMMUNITY): Payer: Self-pay | Admitting: *Deleted

## 2014-04-20 ENCOUNTER — Inpatient Hospital Stay (HOSPITAL_COMMUNITY)
Admission: EM | Admit: 2014-04-20 | Discharge: 2014-04-24 | DRG: 698 | Disposition: A | Discharge status: Home / Self Care | Payer: Medicare Other | Attending: Internal Medicine | Admitting: Internal Medicine

## 2014-04-20 DIAGNOSIS — N390 Urinary tract infection, site not specified: Secondary | ICD-10-CM | POA: Diagnosis present

## 2014-04-20 DIAGNOSIS — B9620 Unspecified Escherichia coli [E. coli] as the cause of diseases classified elsewhere: Secondary | ICD-10-CM | POA: Diagnosis present

## 2014-04-20 DIAGNOSIS — W06XXXA Fall from bed, initial encounter: Secondary | ICD-10-CM | POA: Diagnosis present

## 2014-04-20 DIAGNOSIS — R109 Unspecified abdominal pain: Secondary | ICD-10-CM | POA: Insufficient documentation

## 2014-04-20 DIAGNOSIS — D696 Thrombocytopenia, unspecified: Secondary | ICD-10-CM | POA: Diagnosis present

## 2014-04-20 DIAGNOSIS — E119 Type 2 diabetes mellitus without complications: Secondary | ICD-10-CM | POA: Diagnosis present

## 2014-04-20 DIAGNOSIS — S0083XA Contusion of other part of head, initial encounter: Secondary | ICD-10-CM | POA: Diagnosis present

## 2014-04-20 DIAGNOSIS — Z951 Presence of aortocoronary bypass graft: Secondary | ICD-10-CM

## 2014-04-20 DIAGNOSIS — E1122 Type 2 diabetes mellitus with diabetic chronic kidney disease: Secondary | ICD-10-CM

## 2014-04-20 DIAGNOSIS — I129 Hypertensive chronic kidney disease with stage 1 through stage 4 chronic kidney disease, or unspecified chronic kidney disease: Secondary | ICD-10-CM | POA: Diagnosis present

## 2014-04-20 DIAGNOSIS — K219 Gastro-esophageal reflux disease without esophagitis: Secondary | ICD-10-CM | POA: Diagnosis present

## 2014-04-20 DIAGNOSIS — D649 Anemia, unspecified: Secondary | ICD-10-CM | POA: Diagnosis present

## 2014-04-20 DIAGNOSIS — N184 Chronic kidney disease, stage 4 (severe): Secondary | ICD-10-CM | POA: Diagnosis present

## 2014-04-20 DIAGNOSIS — Z79899 Other long term (current) drug therapy: Secondary | ICD-10-CM | POA: Diagnosis not present

## 2014-04-20 DIAGNOSIS — M549 Dorsalgia, unspecified: Secondary | ICD-10-CM | POA: Diagnosis present

## 2014-04-20 DIAGNOSIS — Z87440 Personal history of urinary (tract) infections: Secondary | ICD-10-CM

## 2014-04-20 DIAGNOSIS — I5033 Acute on chronic diastolic (congestive) heart failure: Secondary | ICD-10-CM | POA: Diagnosis present

## 2014-04-20 DIAGNOSIS — M4800 Spinal stenosis, site unspecified: Secondary | ICD-10-CM | POA: Diagnosis present

## 2014-04-20 DIAGNOSIS — Z90710 Acquired absence of both cervix and uterus: Secondary | ICD-10-CM

## 2014-04-20 DIAGNOSIS — R062 Wheezing: Secondary | ICD-10-CM

## 2014-04-20 DIAGNOSIS — N185 Chronic kidney disease, stage 5: Secondary | ICD-10-CM

## 2014-04-20 DIAGNOSIS — Z794 Long term (current) use of insulin: Secondary | ICD-10-CM | POA: Diagnosis not present

## 2014-04-20 DIAGNOSIS — Z8619 Personal history of other infectious and parasitic diseases: Secondary | ICD-10-CM | POA: Diagnosis not present

## 2014-04-20 DIAGNOSIS — Z888 Allergy status to other drugs, medicaments and biological substances status: Secondary | ICD-10-CM | POA: Diagnosis not present

## 2014-04-20 DIAGNOSIS — E785 Hyperlipidemia, unspecified: Secondary | ICD-10-CM | POA: Diagnosis present

## 2014-04-20 DIAGNOSIS — N322 Vesical fistula, not elsewhere classified: Secondary | ICD-10-CM | POA: Diagnosis present

## 2014-04-20 DIAGNOSIS — N321 Vesicointestinal fistula: Secondary | ICD-10-CM | POA: Diagnosis present

## 2014-04-20 DIAGNOSIS — I2510 Atherosclerotic heart disease of native coronary artery without angina pectoris: Secondary | ICD-10-CM | POA: Diagnosis present

## 2014-04-20 DIAGNOSIS — Z7982 Long term (current) use of aspirin: Secondary | ICD-10-CM | POA: Diagnosis not present

## 2014-04-20 DIAGNOSIS — R112 Nausea with vomiting, unspecified: Secondary | ICD-10-CM

## 2014-04-20 DIAGNOSIS — E1169 Type 2 diabetes mellitus with other specified complication: Secondary | ICD-10-CM | POA: Diagnosis present

## 2014-04-20 DIAGNOSIS — R339 Retention of urine, unspecified: Secondary | ICD-10-CM | POA: Diagnosis not present

## 2014-04-20 DIAGNOSIS — I10 Essential (primary) hypertension: Secondary | ICD-10-CM | POA: Diagnosis present

## 2014-04-20 LAB — CBC WITH DIFFERENTIAL/PLATELET
BASOS PCT: 0 % (ref 0–1)
Basophils Absolute: 0 10*3/uL (ref 0.0–0.1)
EOS ABS: 0 10*3/uL (ref 0.0–0.7)
Eosinophils Relative: 0 % (ref 0–5)
HEMATOCRIT: 29.9 % — AB (ref 36.0–46.0)
Hemoglobin: 9.7 g/dL — ABNORMAL LOW (ref 12.0–15.0)
Lymphocytes Relative: 7 % — ABNORMAL LOW (ref 12–46)
Lymphs Abs: 0.7 10*3/uL (ref 0.7–4.0)
MCH: 29.8 pg (ref 26.0–34.0)
MCHC: 32.4 g/dL (ref 30.0–36.0)
MCV: 92 fL (ref 78.0–100.0)
MONO ABS: 0.6 10*3/uL (ref 0.1–1.0)
Monocytes Relative: 6 % (ref 3–12)
NEUTROS PCT: 87 % — AB (ref 43–77)
Neutro Abs: 8.9 10*3/uL — ABNORMAL HIGH (ref 1.7–7.7)
Platelets: 143 10*3/uL — ABNORMAL LOW (ref 150–400)
RBC: 3.25 MIL/uL — ABNORMAL LOW (ref 3.87–5.11)
RDW: 13.3 % (ref 11.5–15.5)
WBC: 10.2 10*3/uL (ref 4.0–10.5)

## 2014-04-20 LAB — URINE MICROSCOPIC-ADD ON

## 2014-04-20 LAB — COMPREHENSIVE METABOLIC PANEL
ALBUMIN: 3.1 g/dL — AB (ref 3.5–5.2)
ALT: 14 U/L (ref 0–35)
ANION GAP: 15 (ref 5–15)
AST: 17 U/L (ref 0–37)
Alkaline Phosphatase: 65 U/L (ref 39–117)
BUN: 22 mg/dL (ref 6–23)
CO2: 21 mEq/L (ref 19–32)
CREATININE: 2.14 mg/dL — AB (ref 0.50–1.10)
Calcium: 9.1 mg/dL (ref 8.4–10.5)
Chloride: 101 mEq/L (ref 96–112)
GFR calc Af Amer: 24 mL/min — ABNORMAL LOW (ref >90)
GFR calc non Af Amer: 20 mL/min — ABNORMAL LOW (ref >90)
Glucose, Bld: 321 mg/dL — ABNORMAL HIGH (ref 70–99)
Potassium: 3.9 mEq/L (ref 3.7–5.3)
Sodium: 137 mEq/L (ref 137–147)
TOTAL PROTEIN: 7 g/dL (ref 6.0–8.3)
Total Bilirubin: 0.4 mg/dL (ref 0.3–1.2)

## 2014-04-20 LAB — URINALYSIS, ROUTINE W REFLEX MICROSCOPIC
Bilirubin Urine: NEGATIVE
Glucose, UA: >1000 mg/dL — AB
Ketones, ur: NEGATIVE mg/dL
NITRITE: POSITIVE — AB
PH: 6.5 (ref 5.0–8.0)
Protein, ur: >300 mg/dL — AB
SPECIFIC GRAVITY, URINE: 1.016 (ref 1.005–1.030)
UROBILINOGEN UA: 0.2 mg/dL (ref 0.0–1.0)

## 2014-04-20 LAB — I-STAT TROPONIN, ED: TROPONIN I, POC: 0.02 ng/mL (ref 0.00–0.08)

## 2014-04-20 LAB — LIPASE, BLOOD: LIPASE: 46 U/L (ref 11–59)

## 2014-04-20 LAB — CBG MONITORING, ED: Glucose-Capillary: 263 mg/dL — ABNORMAL HIGH (ref 70–99)

## 2014-04-20 MED ORDER — SODIUM CHLORIDE 0.9 % IV BOLUS (SEPSIS)
500.0000 mL | Freq: Once | INTRAVENOUS | Status: AC
  Administered 2014-04-20: 500 mL via INTRAVENOUS

## 2014-04-20 MED ORDER — METRONIDAZOLE IN NACL 5-0.79 MG/ML-% IV SOLN
500.0000 mg | Freq: Once | INTRAVENOUS | Status: AC
  Administered 2014-04-20: 500 mg via INTRAVENOUS
  Filled 2014-04-20: qty 100

## 2014-04-20 MED ORDER — SODIUM CHLORIDE 0.9 % IV SOLN: INTRAVENOUS | Status: DC

## 2014-04-20 MED ORDER — IOHEXOL 300 MG/ML  SOLN
50.0000 mL | Freq: Once | INTRAMUSCULAR | Status: AC | PRN
  Administered 2014-04-20: 50 mL via ORAL

## 2014-04-20 MED ORDER — ONDANSETRON HCL 4 MG/2ML IJ SOLN
4.0000 mg | Freq: Once | INTRAMUSCULAR | Status: AC
  Administered 2014-04-20: 4 mg via INTRAVENOUS
  Filled 2014-04-20: qty 2

## 2014-04-20 MED ORDER — FAMOTIDINE IN NACL 20-0.9 MG/50ML-% IV SOLN
20.0000 mg | Freq: Once | INTRAVENOUS | Status: AC
  Administered 2014-04-20: 20 mg via INTRAVENOUS
  Filled 2014-04-20: qty 50

## 2014-04-20 MED ORDER — DEXTROSE 5 % IV SOLN
1.0000 g | Freq: Once | INTRAVENOUS | Status: AC
  Administered 2014-04-20: 1 g via INTRAVENOUS
  Filled 2014-04-20: qty 10

## 2014-04-20 NOTE — ED Notes (Signed)
Per translator, pt having upper abd pain and sob since falling off her bed on 12/6. Had n/v today. Denies diarrhea.

## 2014-04-20 NOTE — ED Provider Notes (Signed)
CSN: [id number]     Arrival date & time 04/20/14  1742 History   First MD Initiated Contact with Patient 04/20/14 1756     Chief Complaint  Patient presents with  . Abdominal Pain  . Shortness of Breath     (Consider location/radiation/quality/duration/timing/severity/associated sxs/prior Treatment) HPI The patient has had approximately 2 days of vomiting. She reports 7 episodes yesterday and 7 episodes today. She denies any blood in the vomitus. The patient has not had any diarrhea. She does endorse abdominal pain which is predominantly epigastric however also has some lower discomfort associated. It is a burning and cramping in nature. The patient reports subjective fever. The patient had an incidental fall over a week ago. Whereupon she had some significant bruising of her face. That seems to be resolving at this point in time. Past Medical History  Diagnosis Date  . Diabetes mellitus, type 2   . GERD (gastroesophageal reflux disease)   . Hepatitis B   . Hyperlipemia   . Hypertension   . Cervical dysplasia   . Atrophic vaginitis   . Spinal stenosis     s/p decompression  . CAD (coronary artery disease) April 2008    s/p CABG;    cath 8/12:  LM patent, pLAD occluded, OM1 30-40%, pOM2 80% and 60% after the anastomosis, pRCA occluded, S-DX occluded (old), S-OM 2 occluded (new), L-LAD patent, dLAD provided collats to the PDA; Med Rx  rec.; consider PCI to OM2 if fails med Rx  . Hx of hysterectomy   . UTI (lower urinary tract infection)    Past Surgical History  Procedure Laterality Date  . Back surgery      decompression L3-S1  . Coronary artery bypass graft  April 2008  . Total abdominal hysterectomy      cervical dysplasia   History reviewed. No pertinent family history. History  Substance Use Topics  . Smoking status: Never Smoker   . Smokeless tobacco: Not on file  . Alcohol Use: No   OB History    No data available     Review of Systems 10 Systems reviewed and  are negative for acute change except as noted in the HPI.    Allergies  Cozaar and Lisinopril  Home Medications   Prior to Admission medications   Medication Sig Start Date End Date Taking? Authorizing Provider  acetaminophen (TYLENOL) 500 MG tablet Take 2 tablets (1,000 mg total) by mouth every 8 (eight) hours as needed for mild pain. 12/25/13 12/25/14 Yes Carly Rivet, MD  amLODipine (NORVASC) 5 MG tablet Take 1 tablet (5 mg total) by mouth daily. 06/11/13  Yes Lorretta Harp, MD  aspirin EC 81 MG tablet TAKE 1 TABLET BY MOUTH EVERY DAY 01/03/13  Yes Lorretta Harp, MD  calcium citrate-vitamin D (CITRACAL+D) 315-200 MG-UNIT per tablet Take 2 tablets by mouth 2 (two) times daily. 12/10/13  Yes Carly Rivet, MD  carvedilol (COREG) 25 MG tablet TAKE 1 TABLET (25 MG TOTAL) BY MOUTH 2 (TWO) TIMES DAILY WITH A MEAL.   Yes Lorretta Harp, MD  CRESTOR 20 MG tablet TAKE 1 TABLET (20 MG TOTAL) BY MOUTH DAILY. 11/22/13  Yes Levert Feinstein, MD  docusate sodium (COLACE) 100 MG capsule Take 1 capsule (100 mg total) by mouth daily as needed for mild constipation. 12/10/13 12/10/14 Yes Carly Rivet, MD  hydrALAZINE (APRESOLINE) 25 MG tablet Take 1 tablet (25 mg total) by mouth 3 (three) times daily. 06/11/13  Yes Lorretta Harp, MD  isosorbide mononitrate (IMDUR)  30 MG 24 hr tablet TAKE 1 TABLET (30 MG TOTAL) BY MOUTH DAILY. 01/01/14  Yes Carly Rivet, MD  LANTUS 100 UNIT/ML injection INJECT 0.2 MLS (20 UNITS TOTAL) INTO THE SKIN AT BEDTIME. 09/17/13  Yes Lorretta HarpXilin Niu, MD  nitroGLYCERIN (NITROSTAT) 0.4 MG SL tablet Place 0.4 mg under the tongue every 5 (five) minutes as needed. For chest pain. Maximum of 3 doses   Yes Historical Provider, MD  NOVOLOG FLEXPEN 100 UNIT/ML FlexPen INJECT 8 UNITS INTO THE SKIN THREE TIMES DAILY BEFORE MEALS. 01/08/14  Yes Rich Numberarly Rivet, MD  acetaminophen (TYLENOL) 500 MG tablet Take 1 tablet (500 mg total) by mouth every 6 (six) hours as needed. Patient not taking: Reported on 04/20/2014 06/11/13 06/11/14  Lorretta HarpXilin Niu, MD   B-D ULTRAFINE III SHORT PEN 31G X 8 MM MISC USE AS DIRECTED TO INJECT INSULIN 3 TIMES A DAY 03/12/14   Rich Numberarly Rivet, MD  BAYER CONTOUR NEXT TEST test strip TEST BLOOD SUGAR UP TO 5 TIMES A DAY 04/16/13   Lorretta HarpXilin Niu, MD  calcitRIOL (ROCALTROL) 0.25 MCG capsule Take 2 capsules (0.5 mcg total) by mouth daily. Patient not taking: Reported on 04/20/2014 12/25/13   Rich Numberarly Rivet, MD  famotidine (PEPCID) 20 MG tablet TAKE 1 TABLET BY MOUTH TWICE A DAY Patient not taking: Reported on 04/20/2014 10/30/12   Lorretta HarpXilin Niu, MD  ferrous sulfate 325 (65 FE) MG tablet TAKE 1 TABLET BY MOUTH 3 TIMES DAILY WITH MEALS. Patient not taking: Reported on 04/20/2014 07/09/13   Lorretta HarpXilin Niu, MD  pantoprazole (PROTONIX) 20 MG tablet Take 2 tablets (40 mg total) by mouth daily. Patient not taking: Reported on 04/20/2014 06/11/13 06/11/14  Lorretta HarpXilin Niu, MD  PROAIR HFA 108 (90 BASE) MCG/ACT inhaler INHALE 1 PUFF EVERY 6 HOURS AS NEEDED FOR WHEEZE 04/05/14   Rich Numberarly Rivet, MD  sennosides-docusate sodium (SENOKOT-S) 8.6-50 MG tablet Take 1 tablet by mouth daily. Patient not taking: Reported on 04/20/2014 04/11/12   Lorretta HarpXilin Niu, MD  traMADol (ULTRAM) 50 MG tablet Take 1 tablet (50 mg total) by mouth every 6 (six) hours as needed for moderate pain. Patient not taking: Reported on 04/20/2014 12/10/13 12/10/14  Carly Rivet, MD   BP 127/52 mmHg  Pulse 73  Temp(Src) 98.1 F (36.7 C) (Oral)  Resp 18  SpO2 96% Physical Exam  Constitutional: She appears well-developed and well-nourished.  The patient has mildly ill appearance. She is in no acute distress. She is awake and appropriate. Her speech is clear with family members. She has no respiratory distress.  HENT:  There is brownish yellow resolving ecchymoses on the left lateral face. No associated redness or suggestion of cellulitis.  Eyes: EOM are normal. Pupils are equal, round, and reactive to light. No scleral icterus.  Cardiovascular: Normal rate, regular rhythm, normal heart sounds and intact  distal pulses.   Pulmonary/Chest: Effort normal and breath sounds normal. No respiratory distress. She has no wheezes. She has no rales.  Abdominal: Soft. Bowel sounds are normal. She exhibits no distension and no mass. There is tenderness. There is no rebound and no guarding.  Patient versus mid and suprapubic tenderness to palpation. There is no guarding present.  Musculoskeletal: Normal range of motion. She exhibits no edema or tenderness.  Lower extremities are in excellent condition with good skin condition and no peripheral edema.  Neurological: She is alert. No cranial nerve deficit. Coordination normal.  Skin: Skin is warm and dry. No rash noted. No erythema.  Psychiatric: She has a normal mood and  affect.    ED Course  Procedures (including critical care time) Labs Review Labs Reviewed  CBC WITH DIFFERENTIAL - Abnormal; Notable for the following:    RBC 3.25 (*)    Hemoglobin 9.7 (*)    HCT 29.9 (*)    Platelets 143 (*)    Neutrophils Relative % 87 (*)    Neutro Abs 8.9 (*)    Lymphocytes Relative 7 (*)    All other components within normal limits  COMPREHENSIVE METABOLIC PANEL - Abnormal; Notable for the following:    Glucose, Bld 321 (*)    Creatinine, Ser 2.14 (*)    Albumin 3.1 (*)    GFR calc non Af Amer 20 (*)    GFR calc Af Amer 24 (*)    All other components within normal limits  URINALYSIS, ROUTINE W REFLEX MICROSCOPIC - Abnormal; Notable for the following:    APPearance CLOUDY (*)    Glucose, UA >1000 (*)    Hgb urine dipstick SMALL (*)    Protein, ur >300 (*)    Nitrite POSITIVE (*)    Leukocytes, UA TRACE (*)    All other components within normal limits  URINE MICROSCOPIC-ADD ON - Abnormal; Notable for the following:    Squamous Epithelial / LPF FEW (*)    Bacteria, UA MANY (*)    All other components within normal limits  CBG MONITORING, ED - Abnormal; Notable for the following:    Glucose-Capillary 263 (*)    All other components within normal  limits  LIPASE, BLOOD  I-STAT TROPOININ, ED    Imaging Review Ct Abdomen Pelvis Wo Contrast  04/20/2014   CLINICAL DATA:  Acute onset of upper abdominal pain and shortness of breath after falling off bed 2 weeks ago. Nausea and vomiting, acute onset. Initial encounter.  EXAM: CT ABDOMEN AND PELVIS WITHOUT CONTRAST  TECHNIQUE: Multidetector CT imaging of the abdomen and pelvis was performed following the standard protocol without IV contrast.  COMPARISON:  Renal ultrasound performed 03/25/2014, and CT of the abdomen and pelvis from 02/09/2004  FINDINGS: A trace right pleural effusion is noted, with mild focal right basilar airspace opacity, possibly reflecting atelectasis or mild pneumonia.  There is an unusual linear focus of hypoattenuation along the hepatic dome. This could be posttraumatic in nature, though somewhat unusual in appearance for blunt trauma. The spleen is grossly unremarkable.  The gallbladder is mildly distended. The common hepatic duct measures 1.7 cm in diameter, with associated intrahepatic biliary ductal dilatation. This raises concern for distal obstruction, though not well characterized on this study. The pancreas and adrenal glands are unremarkable.  Scattered small nonobstructing bilateral renal stones are seen. A 1.3 cm cyst is noted at the lower pole of the right kidney. The kidneys are otherwise grossly unremarkable. Nonspecific perinephric stranding is noted bilaterally. There is no evidence of hydronephrosis. No obstructing ureteral stones are seen.  No free fluid is identified. The small bowel is unremarkable in appearance. The stomach is within normal limits. No acute vascular abnormalities are seen. Relatively diffuse calcification is noted along the abdominal aorta and its branches. Diffuse postoperative changes noted along the pelvic sidewalls bilaterally.  The appendix is difficult to fully characterize, but there is no evidence of appendicitis. Scattered diverticulosis  is noted along the ascending colon. Contrast progresses to the level of the rectum. There is mild diffuse fatty infiltration along the wall of the ascending colon, raising concern for sequelae of chronic inflammation.  The bladder contains a small to  moderate amount of air. Some of this air is seen along the posterior wall of the bladder, of uncertain significance. There is mild associated soft tissue stranding, and a small fistula from the sigmoid colon cannot be entirely excluded, though there is no evidence of contrast extending into the bladder. Would correlate for recent Foley catheter placement or findings to suggest fistula; emphysematous cystitis cannot be excluded. No inguinal lymphadenopathy is seen.  No acute osseous abnormalities are identified. There is chronic grade 1 anterolisthesis of L4 on L5. Multilevel vacuum phenomenon is noted along the lumbar spine.  IMPRESSION: 1. Linear focus of hypoattenuation along the hepatic dome could be posttraumatic in nature, though somewhat unusual in appearance for blunt trauma. No significant subcapsular hematoma seen. No additional evidence for traumatic injury within the abdomen or pelvis. 2. Trace right pleural effusion, with mild focal right basilar airspace opacity, possibly reflecting atelectasis or mild pneumonia. 3. Small to moderate amount of air noted within the bladder. Would correlate for recent Foley catheter placement. Some of this air is noted along the posterior wall of the bladder, with mild associated soft tissue stranding. A small fistula from the sigmoid colon cannot be entirely excluded, though there is no evidence of contrast extending into the bladder. Would correlate for clinical findings to suggest fistula; emphysematous cystitis cannot be excluded. 4. Dilatation of the common hepatic duct to 1.7 cm in maximal diameter, with intrahepatic biliary ductal dilatation and prominence of the gallbladder, new from the prior study. This raises  concern for distal obstruction, not well characterized on the current study. MRCP or ERCP could be considered for further evaluation, as deemed clinically appropriate. 5. Scattered small nonobstructing bilateral renal stones seen. Small right renal cyst noted. 6. Relatively diffuse calcification along the abdominal aorta and its branches. 7. Scattered diverticulosis along the ascending colon, without evidence of diverticulitis. Mild diffuse fatty infiltration along the wall of the ascending colon raises concern for sequelae of chronic inflammation.   Electronically Signed   By: Roanna RaiderJeffery  Manzo M.D.   On: 04/20/2014 22:10     EKG Interpretation   Date/Time:  Saturday April 20 2014 17:53:49 EST Ventricular Rate:  77 PR Interval:  180 QRS Duration: 80 QT Interval:  392 QTC Calculation: 443 R Axis:   68 Text Interpretation:  Normal sinus rhythm Nonspecific ST abnormality  Abnormal ECG agree.no stemi Confirmed by Donnald GarrePfeiffer, MD, Lebron ConnersMarcy [phone]([id number]) on  04/20/2014 8:26:15 PM     Consult: [facility] Doe[facility]. Surgery to Dr. Janee Mornhompson. At this point in time no acute surgical interventions for findings. Consult: Internal medicine teaching service to see in the emergency department for admission. MDM   Final diagnoses:  Fistula, bladder  UTI (lower urinary tract infection)  Non-intractable vomiting with nausea, vomiting of unspecified type   The patient presents with recurrent vomiting and subjective fever at home . She also has abdominal pain which on examination is more central and suprapubic. 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 Review of the patient's medical record indicates recurrent UTI with Escherichia coli. Her urinalysis today is positive. She will started on Rocephin and Flagyl as well as hydration and control of nausea.    Arby BarretteMarcy Claudine Stallings, MD 04/20/14 [phone]

## 2014-04-20 NOTE — H&P (Signed)
Date: 04/21/2014               Patient Name:  Bianca [name] MRN: [id number]  DOB: 1931-01-11 Age / Sex: 80 y.o., female   PCP: Rich Number, MD         Medical Service: Internal Medicine Teaching Service         Attending Physician: Dr. Inez Catalina, MD    First Contact: Dr. Isabella Bowens  Pager: [phone]  Second Contact: Dr. Andrey Campanile Pager: [phone]       After Hours (After 5p/  First Contact Pager: [phone]  weekends / holidays): Second Contact Pager: [phone]   Chief Complaint: ab pain, n/v  History of Present Illness:  80 yo female with DM II, HTN, CKD, frequent UTI's came in with 2 days of nausea/vomitting (7 yesterday, 7 today), nonbloody, nonbiliary, and not feeling well. Has suprapubic area pain and also some epigastric pain. Has pain with urination, no change in urine color or smell. Had subjective fever but afebrile here. Doesn't eat much at baseline and hasn't been able to eat much last 2 days. Had 1 loose stool in the ED, nonbloody.   Had incidental fall 1 week ago, falling from her bed and hitting her head/face on a table, had bruising on her face. No loc, no weakness, numbness, tingling, vision changes. Mental status at her baseline per family. Her face and forehead area hurts and has a bruise on the left forehead.  Lives with son and daughter in law. Has lot of family support.   Frequent UTI's in the past. All E.coli, resistant to cipro, levaquin, and bactrim. Was treated with fosfomycin 3 grams in the clinic last on 01/2014. Repeat UA on 03/2014 showed clearing. Seen at clinic on 04/02/14 by Dr. Senaida Ores with intermittent dysuria.   Meds: Current Facility-Administered Medications  Medication Dose Route Frequency Provider Last Rate Last Dose  . 0.9 %  sodium chloride infusion   Intravenous Continuous Arby Barrette, MD   Stopped at 04/20/14 1855  . 0.9 %  sodium chloride infusion   Intravenous Continuous Arby Barrette, MD       Current Outpatient Prescriptions    Medication Sig Dispense Refill  . acetaminophen (TYLENOL) 500 MG tablet Take 2 tablets (1,000 mg total) by mouth every 8 (eight) hours as needed for mild pain. 100 tablet 2  . amLODipine (NORVASC) 5 MG tablet Take 1 tablet (5 mg total) by mouth daily. 30 tablet 11  . aspirin EC 81 MG tablet TAKE 1 TABLET BY MOUTH EVERY DAY 150 tablet 1  . calcium citrate-vitamin D (CITRACAL+D) 315-200 MG-UNIT per tablet Take 2 tablets by mouth 2 (two) times daily. 100 tablet 3  . carvedilol (COREG) 25 MG tablet TAKE 1 TABLET (25 MG TOTAL) BY MOUTH 2 (TWO) TIMES DAILY WITH A MEAL. 120 tablet 3  . CRESTOR 20 MG tablet TAKE 1 TABLET (20 MG TOTAL) BY MOUTH DAILY. 90 tablet 3  . docusate sodium (COLACE) 100 MG capsule Take 1 capsule (100 mg total) by mouth daily as needed for mild constipation. 30 capsule 1  . hydrALAZINE (APRESOLINE) 25 MG tablet Take 1 tablet (25 mg total) by mouth 3 (three) times daily. 270 tablet 5  . isosorbide mononitrate (IMDUR) 30 MG 24 hr tablet TAKE 1 TABLET (30 MG TOTAL) BY MOUTH DAILY. 90 tablet 3  . LANTUS 100 UNIT/ML injection INJECT 0.2 MLS (20 UNITS TOTAL) INTO THE SKIN AT BEDTIME. 10 mL 5  . nitroGLYCERIN (NITROSTAT) 0.4 MG SL  tablet Place 0.4 mg under the tongue every 5 (five) minutes as needed. For chest pain. Maximum of 3 doses    . NOVOLOG FLEXPEN 100 UNIT/ML FlexPen INJECT 8 UNITS INTO THE SKIN THREE TIMES DAILY BEFORE MEALS. 100 mL 9  . acetaminophen (TYLENOL) 500 MG tablet Take 1 tablet (500 mg total) by mouth every 6 (six) hours as needed. (Patient not taking: Reported on 04/20/2014) 100 tablet 0  . B-D ULTRAFINE III SHORT PEN 31G X 8 MM MISC USE AS DIRECTED TO INJECT INSULIN 3 TIMES A DAY 100 each 6  . BAYER CONTOUR NEXT TEST test strip TEST BLOOD SUGAR UP TO 5 TIMES A DAY 100 each 11  . calcitRIOL (ROCALTROL) 0.25 MCG capsule Take 2 capsules (0.5 mcg total) by mouth daily. (Patient not taking: Reported on 04/20/2014) 60 capsule 5  . famotidine (PEPCID) 20 MG tablet TAKE 1  TABLET BY MOUTH TWICE A DAY (Patient not taking: Reported on 04/20/2014) 60 tablet 3  . ferrous sulfate 325 (65 FE) MG tablet TAKE 1 TABLET BY MOUTH 3 TIMES DAILY WITH MEALS. (Patient not taking: Reported on 04/20/2014) 90 tablet 5  . pantoprazole (PROTONIX) 20 MG tablet Take 2 tablets (40 mg total) by mouth daily. (Patient not taking: Reported on 04/20/2014) 30 tablet 1  . PROAIR HFA 108 (90 BASE) MCG/ACT inhaler INHALE 1 PUFF EVERY 6 HOURS AS NEEDED FOR WHEEZE 8.5 each 2  . sennosides-docusate sodium (SENOKOT-S) 8.6-50 MG tablet Take 1 tablet by mouth daily. (Patient not taking: Reported on 04/20/2014) 90 tablet 3  . traMADol (ULTRAM) 50 MG tablet Take 1 tablet (50 mg total) by mouth every 6 (six) hours as needed for moderate pain. (Patient not taking: Reported on 04/20/2014) 20 tablet 0    Allergies: Allergies as of 04/20/2014 - Review Complete 04/20/2014  Allergen Reaction Noted  . Cozaar Other (See Comments) 02/12/2011  . Lisinopril  05/24/2012   Past Medical History  Diagnosis Date  . Diabetes mellitus, type 2   . GERD (gastroesophageal reflux disease)   . Hepatitis B   . Hyperlipemia   . Hypertension   . Cervical dysplasia   . Atrophic vaginitis   . Spinal stenosis     s/p decompression  . CAD (coronary artery disease) April 2008    s/p CABG;    cath 8/12:  LM patent, pLAD occluded, OM1 30-40%, pOM2 80% and 60% after the anastomosis, pRCA occluded, S-DX occluded (old), S-OM 2 occluded (new), L-LAD patent, dLAD provided collats to the PDA; Med Rx  rec.; consider PCI to OM2 if fails med Rx  . Hx of hysterectomy   . UTI (lower urinary tract infection)    Past Surgical History  Procedure Laterality Date  . Back surgery      decompression L3-S1  . Coronary artery bypass graft  April 2008  . Total abdominal hysterectomy      cervical dysplasia   History reviewed. No pertinent family history. History   Social History  . Marital Status: Widowed    Spouse Name: N/A    Number  of Children: N/A  . Years of Education: N/A   Occupational History  . Not on file.   Social History Main Topics  . Smoking status: Never Smoker   . Smokeless tobacco: Not on file  . Alcohol Use: No  . Drug Use: No  . Sexual Activity: Not on file   Other Topics Concern  . Not on file   Social History Narrative   [place]  immigrant   Housewife   Widowed   Lives w/ her son    Review of Systems: Review of Systems  Constitutional: Positive for fever and chills. Negative for weight loss, malaise/fatigue and diaphoresis.       Subjective fever  HENT: Negative for congestion, ear discharge, ear pain, hearing loss, nosebleeds, sore throat and tinnitus.   Eyes: Negative for blurred vision, double vision, photophobia, pain, discharge and redness.  Respiratory: Negative for cough, hemoptysis, sputum production, shortness of breath, wheezing and stridor.        Mild SOB.  Cardiovascular: Negative for chest pain, palpitations, orthopnea, claudication, leg swelling and PND.  Gastrointestinal: Positive for nausea, vomiting and abdominal pain. Negative for heartburn, diarrhea, constipation, blood in stool and melena.  Genitourinary: Positive for dysuria. Negative for urgency, frequency, hematuria and flank pain.  Musculoskeletal: Positive for falls. Negative for myalgias, back pain, joint pain and neck pain.  Skin: Negative for itching and rash.  Neurological: Positive for headaches. Negative for dizziness, tingling, tremors, sensory change, speech change, focal weakness, seizures, loss of consciousness and weakness.  Endo/Heme/Allergies: Negative.   Psychiatric/Behavioral: Negative.      Physical Exam: Blood pressure 127/52, pulse 73, temperature 98.1 F (36.7 C), temperature source Oral, resp. rate 18, SpO2 96 %. Physical Exam  Constitutional: She is oriented to person, place, and time. No distress.  Very pleasant female. Appears to be in no distress.   HENT:  Right Ear: External ear  normal.  Left Ear: External ear normal.  Nose: Nose normal.  Mouth/Throat: Oropharynx is clear and moist.  Has bruise visible on left forehead area.   Eyes: Conjunctivae and EOM are normal. Pupils are equal, round, and reactive to light. Right eye exhibits no discharge. Left eye exhibits no discharge. No scleral icterus.  Neck: Normal range of motion. Neck supple. No JVD present. No tracheal deviation present. No thyromegaly present.  Cardiovascular: Normal rate, regular rhythm and normal heart sounds.  Exam reveals no gallop and no friction rub.   No murmur heard. Respiratory: Effort normal and breath sounds normal. No stridor. No respiratory distress. She has no wheezes. She has no rales. She exhibits no tenderness.  GI: Soft. Bowel sounds are normal. She exhibits no distension and no mass. There is tenderness. There is no rebound and no guarding.  Has TTP on suprapubic area and also some mild TTP on epigastric area, also some mild TTP generalized.  Musculoskeletal: Normal range of motion. She exhibits no tenderness.  Trace edema on b/l lower extremities.  Lymphadenopathy:    She has no cervical adenopathy.  Neurological: She is alert and oriented to person, place, and time. She has normal reflexes. She displays normal reflexes. No cranial nerve deficit. She exhibits normal muscle tone. Coordination normal.  Skin: Skin is warm and dry. She is not diaphoretic.     Lab results: Basic Metabolic Panel:  Recent Labs  78/29/56 1810  NA 137  K 3.9  CL 101  CO2 21  GLUCOSE 321*  BUN 22  CREATININE 2.14*  CALCIUM 9.1   Liver Function Tests:  Recent Labs  04/20/14 1810  AST 17  ALT 14  ALKPHOS 65  BILITOT 0.4  PROT 7.0  ALBUMIN 3.1*    Recent Labs  04/20/14 1810  LIPASE 46   No results for input(s): AMMONIA in the last 72 hours. CBC:  Recent Labs  04/20/14 1810  WBC 10.2  NEUTROABS 8.9*  HGB 9.7*  HCT 29.9*  MCV 92.0  PLT  143*   Cardiac Enzymes: No results  for input(s): CKTOTAL, CKMB, CKMBINDEX, TROPONINI in the last 72 hours. BNP: No results for input(s): PROBNP in the last 72 hours. D-Dimer: No results for input(s): DDIMER in the last 72 hours. CBG:  Recent Labs  04/20/14 2119  GLUCAP 263*   Hemoglobin A1C: No results for input(s): HGBA1C in the last 72 hours. Fasting Lipid Panel: No results for input(s): CHOL, HDL, LDLCALC, TRIG, CHOLHDL, LDLDIRECT in the last 72 hours. Thyroid Function Tests: No results for input(s): TSH, T4TOTAL, FREET4, T3FREE, THYROIDAB in the last 72 hours. Anemia Panel: No results for input(s): VITAMINB12, FOLATE, FERRITIN, TIBC, IRON, RETICCTPCT in the last 72 hours. Coagulation: No results for input(s): LABPROT, INR in the last 72 hours. Urine Drug Screen: Drugs of Abuse  No results found for: LABOPIA, COCAINSCRNUR, LABBENZ, AMPHETMU, THCU, LABBARB  Alcohol Level: No results for input(s): ETH in the last 72 hours. Urinalysis:  Recent Labs  04/20/14 1822  COLORURINE YELLOW  LABSPEC 1.016  PHURINE 6.5  GLUCOSEU >1000*  HGBUR SMALL*  BILIRUBINUR NEGATIVE  KETONESUR NEGATIVE  PROTEINUR >300*  UROBILINOGEN 0.2  NITRITE POSITIVE*  LEUKOCYTESUR TRACE*   Misc. Labs:  Imaging results:  Ct Abdomen Pelvis Wo Contrast  04/20/2014   CLINICAL DATA:  Acute onset of upper abdominal pain and shortness of breath after falling off bed 2 weeks ago. Nausea and vomiting, acute onset. Initial encounter.  EXAM: CT ABDOMEN AND PELVIS WITHOUT CONTRAST  TECHNIQUE: Multidetector CT imaging of the abdomen and pelvis was performed following the standard protocol without IV contrast.  COMPARISON:  Renal ultrasound performed 03/25/2014, and CT of the abdomen and pelvis from 02/09/2004  FINDINGS: A trace right pleural effusion is noted, with mild focal right basilar airspace opacity, possibly reflecting atelectasis or mild pneumonia.  There is an unusual linear focus of hypoattenuation along the hepatic dome. This could be  posttraumatic in nature, though somewhat unusual in appearance for blunt trauma. The spleen is grossly unremarkable.  The gallbladder is mildly distended. The common hepatic duct measures 1.7 cm in diameter, with associated intrahepatic biliary ductal dilatation. This raises concern for distal obstruction, though not well characterized on this study. The pancreas and adrenal glands are unremarkable.  Scattered small nonobstructing bilateral renal stones are seen. A 1.3 cm cyst is noted at the lower pole of the right [name]. The kidneys are otherwise grossly unremarkable. Nonspecific perinephric stranding is noted bilaterally. There is no evidence of hydronephrosis. No obstructing ureteral stones are seen.  No free fluid is identified. The small bowel is unremarkable in appearance. The stomach is within normal limits. No acute vascular abnormalities are seen. Relatively diffuse calcification is noted along the abdominal aorta and its branches. Diffuse postoperative changes noted along the pelvic sidewalls bilaterally.  The appendix is difficult to fully characterize, but there is no evidence of appendicitis. Scattered diverticulosis is noted along the ascending colon. Contrast progresses to the level of the rectum. There is mild diffuse fatty infiltration along the wall of the ascending colon, raising concern for sequelae of chronic inflammation.  The bladder contains a small to moderate amount of air. Some of this air is seen along the posterior wall of the bladder, of uncertain significance. There is mild associated soft tissue stranding, and a small fistula from the sigmoid colon cannot be entirely excluded, though there is no evidence of contrast extending into the bladder. Would correlate for recent Foley catheter placement or findings to suggest fistula; emphysematous cystitis cannot be excluded.  No inguinal lymphadenopathy is seen.  No acute osseous abnormalities are identified. There is chronic grade 1  anterolisthesis of L4 on L5. Multilevel vacuum phenomenon is noted along the lumbar spine.  IMPRESSION: 1. Linear focus of hypoattenuation along the hepatic dome could be posttraumatic in nature, though somewhat unusual in appearance for blunt trauma. No significant subcapsular hematoma seen. No additional evidence for traumatic injury within the abdomen or pelvis. 2. Trace right pleural effusion, with mild focal right basilar airspace opacity, possibly reflecting atelectasis or mild pneumonia. 3. Small to moderate amount of air noted within the bladder. Would correlate for recent Foley catheter placement. Some of this air is noted along the posterior wall of the bladder, with mild associated soft tissue stranding. A small fistula from the sigmoid colon cannot be entirely excluded, though there is no evidence of contrast extending into the bladder. Would correlate for clinical findings to suggest fistula; emphysematous cystitis cannot be excluded. 4. Dilatation of the common hepatic duct to 1.7 cm in maximal diameter, with intrahepatic biliary ductal dilatation and prominence of the gallbladder, new from the prior study. This raises concern for distal obstruction, not well characterized on the current study. MRCP or ERCP could be considered for further evaluation, as deemed clinically appropriate. 5. Scattered small nonobstructing bilateral renal stones seen. Small right renal cyst noted. 6. Relatively diffuse calcification along the abdominal aorta and its branches. 7. Scattered diverticulosis along the ascending colon, without evidence of diverticulitis. Mild diffuse fatty infiltration along the wall of the ascending colon raises concern for sequelae of chronic inflammation.   Electronically Signed   By: Roanna RaiderJeffery  Chargois M.D.   On: 04/20/2014 22:10    Other results: EKG: NSR, nonspecific T wave changes.   Assessment & Plan by Problem: Active Problems:   Fistula, intestinovesical 80 yo female with DM II,  HTN, CKD, frequent Ecoli UTI's here with UTI and sigmoido-vesicular fistula.  UTI - likely 2/2 to sigmoido-vesicular fistula - UA suggestive of UTI. Last UCX all showing ecoli resistant to cipro/levaquin/bactrim. No CVA tenderness, has n/v, afebrile, no wbc count, no other signs of sepsis. Did have subjective fever at home.  - get UCX. Will not get BCX unless spikes a fever. - will treat with rocephin + flagyl for anaerobic coverage since she has the fistula - possibly can d/c flagyl and continue to treat with rocephin for UTI - NPO for now with sips allowed for meds - IVF 125cc/hr - zofran for nausea/vomitting. N/v likely 2/2 to UTI.   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 This is likely the cause of her frequent UTI's.  - surgery Dr. Janee Mornhompson was consulted by ED, he stated that there is no acute surgical interventions for this. - will need to consult GI and Urology in the morning.  CBD dilation  - On CT - 1.7 cm dilated, gallbladder prominence, and intrahepatic biliary duct dilation. - has mild diffuse ttp on both sides of the abdomen but no Physical exam finding for cholecystitis. LFT's normal.  - may get ERCP or MRCP to follow up if suspect any biliary pathology.  DM II - takes novolog 8 units TID + lantus 20 units QHS - will just do SSI here since patient is NPO. Can add lantus ~15 unit when eating.  CKD IV - likely 2/2 to DM II - baseline crt ~2.5-2.9. Now 2.14. - crt is better than baseline. Continue to monitor  chronic normocytic anemia - baseline hgb around 10. Now  close to baseline ~9.7.  - continue to monitor. Likely 2/2 to CKD  Thrombocytopenia - plt 143. B/l ~110-130's.  - LFT's look normal otherwise. Has hx of Hep B.  HTN - at home norvasc 5mg , coreg 25mg  BID, hydralazine 25 TID, imdur 30mg  24hr - took her BP meds before coming to the ED. - will not give any BP meds now as BP is normal in 120-140's.  - restart meds tomorrow.  HLD - cont  crestor  CAD - CABG on April 2008. on asa, coreg  GERD - protonix at home. Cont.   Back pain 2/2 to spinal stenosis - takes tramadol at home. - continue tramadol + tylenol.   Dispo: Disposition is deferred at this time, awaiting improvement of current medical problems. Anticipated discharge in approximately 1-2 day(s).   The patient does have a current PCP (Rich Number, MD) and does need an [facility] hospital follow-up appointment after discharge.  The patient does not have transportation limitations that hinder transportation to clinic appointments.  Signed: Hyacinth Meeker, MD 04/21/2014, 12:11 AM

## 2014-04-20 NOTE — ED Notes (Signed)
DR PFEIFFER IN W/PT.

## 2014-04-21 ENCOUNTER — Encounter (HOSPITAL_COMMUNITY): Payer: Self-pay | Admitting: *Deleted

## 2014-04-21 DIAGNOSIS — D649 Anemia, unspecified: Secondary | ICD-10-CM

## 2014-04-21 DIAGNOSIS — E785 Hyperlipidemia, unspecified: Secondary | ICD-10-CM

## 2014-04-21 DIAGNOSIS — N184 Chronic kidney disease, stage 4 (severe): Secondary | ICD-10-CM

## 2014-04-21 DIAGNOSIS — N322 Vesical fistula, not elsewhere classified: Secondary | ICD-10-CM | POA: Insufficient documentation

## 2014-04-21 DIAGNOSIS — R109 Unspecified abdominal pain: Secondary | ICD-10-CM | POA: Insufficient documentation

## 2014-04-21 DIAGNOSIS — R112 Nausea with vomiting, unspecified: Secondary | ICD-10-CM | POA: Insufficient documentation

## 2014-04-21 DIAGNOSIS — M4800 Spinal stenosis, site unspecified: Secondary | ICD-10-CM

## 2014-04-21 DIAGNOSIS — I2510 Atherosclerotic heart disease of native coronary artery without angina pectoris: Secondary | ICD-10-CM

## 2014-04-21 DIAGNOSIS — E119 Type 2 diabetes mellitus without complications: Secondary | ICD-10-CM

## 2014-04-21 DIAGNOSIS — I10 Essential (primary) hypertension: Secondary | ICD-10-CM

## 2014-04-21 DIAGNOSIS — K219 Gastro-esophageal reflux disease without esophagitis: Secondary | ICD-10-CM

## 2014-04-21 DIAGNOSIS — M549 Dorsalgia, unspecified: Secondary | ICD-10-CM

## 2014-04-21 DIAGNOSIS — N390 Urinary tract infection, site not specified: Secondary | ICD-10-CM | POA: Insufficient documentation

## 2014-04-21 DIAGNOSIS — K632 Fistula of intestine: Secondary | ICD-10-CM

## 2014-04-21 DIAGNOSIS — D696 Thrombocytopenia, unspecified: Secondary | ICD-10-CM

## 2014-04-21 LAB — BASIC METABOLIC PANEL
ANION GAP: 13 (ref 5–15)
BUN: 19 mg/dL (ref 6–23)
CALCIUM: 8.1 mg/dL — AB (ref 8.4–10.5)
CHLORIDE: 102 meq/L (ref 96–112)
CO2: 20 meq/L (ref 19–32)
Creatinine, Ser: 2.12 mg/dL — ABNORMAL HIGH (ref 0.50–1.10)
GFR calc Af Amer: 24 mL/min — ABNORMAL LOW (ref >90)
GFR calc non Af Amer: 21 mL/min — ABNORMAL LOW (ref >90)
GLUCOSE: 152 mg/dL — AB (ref 70–99)
Potassium: 3.8 mEq/L (ref 3.7–5.3)
SODIUM: 135 meq/L — AB (ref 137–147)

## 2014-04-21 LAB — GLUCOSE, CAPILLARY
GLUCOSE-CAPILLARY: 143 mg/dL — AB (ref 70–99)
GLUCOSE-CAPILLARY: 113 mg/dL — AB (ref 70–99)
Glucose-Capillary: 194 mg/dL — ABNORMAL HIGH (ref 70–99)
Glucose-Capillary: 103 mg/dL — ABNORMAL HIGH (ref 70–99)
Glucose-Capillary: 175 mg/dL — ABNORMAL HIGH (ref 70–99)

## 2014-04-21 LAB — CBC
HEMATOCRIT: 25.5 % — AB (ref 36.0–46.0)
HEMOGLOBIN: 8.1 g/dL — AB (ref 12.0–15.0)
MCH: 29.5 pg (ref 26.0–34.0)
MCHC: 31.8 g/dL (ref 30.0–36.0)
MCV: 92.7 fL (ref 78.0–100.0)
Platelets: 127 10*3/uL — ABNORMAL LOW (ref 150–400)
RBC: 2.75 MIL/uL — AB (ref 3.87–5.11)
RDW: 13.4 % (ref 11.5–15.5)
WBC: 7.7 10*3/uL (ref 4.0–10.5)

## 2014-04-21 LAB — HEMOGLOBIN A1C
Hgb A1c MFr Bld: 8.1 % — ABNORMAL HIGH (ref <5.7)
Mean Plasma Glucose: 186 mg/dL — ABNORMAL HIGH (ref <117)

## 2014-04-21 MED ORDER — ROSUVASTATIN CALCIUM 20 MG PO TABS
20.0000 mg | ORAL_TABLET | Freq: Every day | ORAL | Status: DC
  Administered 2014-04-21 – 2014-04-23 (×3): 20 mg via ORAL
  Filled 2014-04-21 (×4): qty 1

## 2014-04-21 MED ORDER — CEFTRIAXONE SODIUM IN DEXTROSE 20 MG/ML IV SOLN
1.0000 g | INTRAVENOUS | Status: DC
  Administered 2014-04-21 – 2014-04-22 (×2): 1 g via INTRAVENOUS
  Filled 2014-04-21 (×3): qty 50

## 2014-04-21 MED ORDER — ISOSORBIDE MONONITRATE ER 30 MG PO TB24
30.0000 mg | ORAL_TABLET | Freq: Every day | ORAL | Status: DC
  Administered 2014-04-21 – 2014-04-24 (×4): 30 mg via ORAL
  Filled 2014-04-21 (×4): qty 1

## 2014-04-21 MED ORDER — TRAMADOL HCL 50 MG PO TABS
50.0000 mg | ORAL_TABLET | Freq: Two times a day (BID) | ORAL | Status: DC | PRN
  Administered 2014-04-21 – 2014-04-22 (×2): 50 mg via ORAL
  Filled 2014-04-21 (×2): qty 1

## 2014-04-21 MED ORDER — METRONIDAZOLE IN NACL 5-0.79 MG/ML-% IV SOLN
500.0000 mg | Freq: Four times a day (QID) | INTRAVENOUS | Status: DC
  Administered 2014-04-21 – 2014-04-23 (×9): 500 mg via INTRAVENOUS
  Filled 2014-04-21 (×11): qty 100

## 2014-04-21 MED ORDER — AMLODIPINE BESYLATE 5 MG PO TABS
5.0000 mg | ORAL_TABLET | Freq: Every day | ORAL | Status: DC
  Administered 2014-04-21 – 2014-04-24 (×4): 5 mg via ORAL
  Filled 2014-04-21 (×5): qty 1

## 2014-04-21 MED ORDER — HEPARIN SODIUM (PORCINE) 5000 UNIT/ML IJ SOLN
5000.0000 [IU] | Freq: Three times a day (TID) | INTRAMUSCULAR | Status: DC
  Administered 2014-04-21 – 2014-04-24 (×9): 5000 [IU] via SUBCUTANEOUS
  Filled 2014-04-21 (×14): qty 1

## 2014-04-21 MED ORDER — PANTOPRAZOLE SODIUM 40 MG PO TBEC
40.0000 mg | DELAYED_RELEASE_TABLET | Freq: Every day | ORAL | Status: DC
  Administered 2014-04-21 – 2014-04-24 (×4): 40 mg via ORAL
  Filled 2014-04-21 (×4): qty 1

## 2014-04-21 MED ORDER — DOCUSATE SODIUM 100 MG PO CAPS: 100.0000 mg | ORAL_CAPSULE | Freq: Every day | ORAL | Status: DC | PRN

## 2014-04-21 MED ORDER — TRAMADOL HCL 50 MG PO TABS: 50.0000 mg | ORAL_TABLET | Freq: Two times a day (BID) | ORAL | Status: DC

## 2014-04-21 MED ORDER — OXYCODONE-ACETAMINOPHEN 5-325 MG PO TABS
1.0000 | ORAL_TABLET | Freq: Four times a day (QID) | ORAL | Status: DC | PRN
  Administered 2014-04-21 (×2): 1 via ORAL
  Filled 2014-04-21 (×2): qty 1

## 2014-04-21 MED ORDER — CARVEDILOL 25 MG PO TABS
25.0000 mg | ORAL_TABLET | Freq: Two times a day (BID) | ORAL | Status: DC
  Administered 2014-04-21 – 2014-04-24 (×7): 25 mg via ORAL
  Filled 2014-04-21 (×10): qty 1

## 2014-04-21 MED ORDER — ALBUTEROL SULFATE (2.5 MG/3ML) 0.083% IN NEBU
3.0000 mL | INHALATION_SOLUTION | RESPIRATORY_TRACT | Status: DC | PRN
  Administered 2014-04-22 – 2014-04-23 (×4): 3 mL via RESPIRATORY_TRACT
  Filled 2014-04-21 (×4): qty 3

## 2014-04-21 MED ORDER — ACETAMINOPHEN 650 MG RE SUPP: 650.0000 mg | Freq: Four times a day (QID) | RECTAL | Status: DC | PRN

## 2014-04-21 MED ORDER — HYDRALAZINE HCL 25 MG PO TABS
25.0000 mg | ORAL_TABLET | Freq: Three times a day (TID) | ORAL | Status: DC
  Administered 2014-04-21 – 2014-04-24 (×9): 25 mg via ORAL
  Filled 2014-04-21 (×13): qty 1

## 2014-04-21 MED ORDER — SODIUM CHLORIDE 0.9 % IV SOLN
INTRAVENOUS | Status: AC
  Administered 2014-04-21: 03:00:00 via INTRAVENOUS

## 2014-04-21 MED ORDER — NITROGLYCERIN 0.4 MG SL SUBL: 0.4000 mg | SUBLINGUAL_TABLET | SUBLINGUAL | Status: DC | PRN

## 2014-04-21 MED ORDER — ASPIRIN EC 81 MG PO TBEC
81.0000 mg | DELAYED_RELEASE_TABLET | Freq: Every day | ORAL | Status: DC
  Administered 2014-04-21 – 2014-04-24 (×4): 81 mg via ORAL
  Filled 2014-04-21 (×5): qty 1

## 2014-04-21 MED ORDER — ONDANSETRON HCL 4 MG PO TABS: 4.0000 mg | ORAL_TABLET | Freq: Four times a day (QID) | ORAL | Status: DC | PRN

## 2014-04-21 MED ORDER — ONDANSETRON HCL 4 MG/2ML IJ SOLN
4.0000 mg | Freq: Four times a day (QID) | INTRAMUSCULAR | Status: DC | PRN
  Administered 2014-04-22 (×2): 4 mg via INTRAVENOUS
  Filled 2014-04-21 (×2): qty 2

## 2014-04-21 MED ORDER — INSULIN ASPART 100 UNIT/ML ~~LOC~~ SOLN
0.0000 [IU] | SUBCUTANEOUS | Status: DC
  Administered 2014-04-21 (×2): 2 [IU] via SUBCUTANEOUS
  Administered 2014-04-21: 1 [IU] via SUBCUTANEOUS
  Administered 2014-04-22 (×2): 2 [IU] via SUBCUTANEOUS
  Administered 2014-04-22 (×2): 1 [IU] via SUBCUTANEOUS
  Administered 2014-04-23: 3 [IU] via SUBCUTANEOUS
  Administered 2014-04-23: 2 [IU] via SUBCUTANEOUS
  Administered 2014-04-23: 1 [IU] via SUBCUTANEOUS
  Administered 2014-04-23: 2 [IU] via SUBCUTANEOUS
  Administered 2014-04-24: 1 [IU] via SUBCUTANEOUS
  Administered 2014-04-24: 3 [IU] via SUBCUTANEOUS
  Administered 2014-04-24: 2 [IU] via SUBCUTANEOUS

## 2014-04-21 MED ORDER — ACETAMINOPHEN 325 MG PO TABS: 650.0000 mg | ORAL_TABLET | Freq: Four times a day (QID) | ORAL | Status: DC | PRN

## 2014-04-21 NOTE — Consult Note (Signed)
[facility] Consultation Note  Referring Provider: Dr. Debe Coder (Medical Teaching service) Primary Care Physician:  Rich Number, MD  Reason for Consultation:  Abnormal CT scan  HPI: Bianca [name] is a 80 y.o. female admitted for fevers.  Found to have urinary tract infections, for which she has history of multiple.  Patient does not speak English, and interpretation was provided by family members, over 10 of which were in room with patient at the time of my examination.  Had CT showing air in bladder, and we were asked to see patient for evaluation of possible colovesicular fistula.  Patient has had several days' of epigastric pain with dark stools; she is on oral iron, but as best I can tell, the color of the stool has darkened more over the past several days.  She has also had some solid-predominant dysphagia.  No nausea or vomiting.  Occasional diarrhea with antibiotics.  No change in bowel habits.  No weight loss.  ASA 81/day, no other NSAIDs.  No prior endoscopy or colonoscopy.   Past Medical History  Diagnosis Date  . Diabetes mellitus, type 2   . GERD (gastroesophageal reflux disease)   . Hepatitis B   . Hyperlipemia   . Hypertension   . Cervical dysplasia   . Atrophic vaginitis   . Spinal stenosis     s/p decompression  . CAD (coronary artery disease) April 2008    s/p CABG;    cath 8/12:  LM patent, pLAD occluded, OM1 30-40%, pOM2 80% and 60% after the anastomosis, pRCA occluded, S-DX occluded (old), S-OM 2 occluded (new), L-LAD patent, dLAD provided collats to the PDA; Med Rx  rec.; consider PCI to OM2 if fails med Rx  . Hx of hysterectomy   . UTI (lower urinary tract infection)     Past Surgical History  Procedure Laterality Date  . Back surgery      decompression L3-S1  . Coronary artery bypass graft  April 2008  . Total abdominal hysterectomy      cervical dysplasia    Prior to Admission medications   Medication Sig Start Date End Date Taking?  Authorizing Provider  acetaminophen (TYLENOL) 500 MG tablet Take 2 tablets (1,000 mg total) by mouth every 8 (eight) hours as needed for mild pain. 12/25/13 12/25/14 Yes Carly Rivet, MD  amLODipine (NORVASC) 5 MG tablet Take 1 tablet (5 mg total) by mouth daily. 06/11/13  Yes Lorretta Harp, MD  aspirin EC 81 MG tablet TAKE 1 TABLET BY MOUTH EVERY DAY 01/03/13  Yes Lorretta Harp, MD  calcium citrate-vitamin D (CITRACAL+D) 315-200 MG-UNIT per tablet Take 2 tablets by mouth 2 (two) times daily. 12/10/13  Yes Carly Rivet, MD  carvedilol (COREG) 25 MG tablet TAKE 1 TABLET (25 MG TOTAL) BY MOUTH 2 (TWO) TIMES DAILY WITH A MEAL.   Yes Lorretta Harp, MD  CRESTOR 20 MG tablet TAKE 1 TABLET (20 MG TOTAL) BY MOUTH DAILY. 11/22/13  Yes Levert Feinstein, MD  docusate sodium (COLACE) 100 MG capsule Take 1 capsule (100 mg total) by mouth daily as needed for mild constipation. 12/10/13 12/10/14 Yes Carly Rivet, MD  hydrALAZINE (APRESOLINE) 25 MG tablet Take 1 tablet (25 mg total) by mouth 3 (three) times daily. 06/11/13  Yes Lorretta Harp, MD  isosorbide mononitrate (IMDUR) 30 MG 24 hr tablet TAKE 1 TABLET (30 MG TOTAL) BY MOUTH DAILY. 01/01/14  Yes Carly Rivet, MD  LANTUS 100 UNIT/ML injection INJECT 0.2 MLS (20 UNITS TOTAL) INTO THE SKIN  AT BEDTIME. 09/17/13  Yes Lorretta HarpXilin Niu, MD  nitroGLYCERIN (NITROSTAT) 0.4 MG SL tablet Place 0.4 mg under the tongue every 5 (five) minutes as needed. For chest pain. Maximum of 3 doses   Yes Historical Provider, MD  NOVOLOG FLEXPEN 100 UNIT/ML FlexPen INJECT 8 UNITS INTO THE SKIN THREE TIMES DAILY BEFORE MEALS. 01/08/14  Yes Rich Numberarly Rivet, MD  acetaminophen (TYLENOL) 500 MG tablet Take 1 tablet (500 mg total) by mouth every 6 (six) hours as needed. Patient not taking: Reported on 04/20/2014 06/11/13 06/11/14  Lorretta HarpXilin Niu, MD  B-D ULTRAFINE III SHORT PEN 31G X 8 MM MISC USE AS DIRECTED TO INJECT INSULIN 3 TIMES A DAY 03/12/14   Rich Numberarly Rivet, MD  BAYER CONTOUR NEXT TEST test strip TEST BLOOD SUGAR UP TO 5 TIMES A DAY  04/16/13   Lorretta HarpXilin Niu, MD  calcitRIOL (ROCALTROL) 0.25 MCG capsule Take 2 capsules (0.5 mcg total) by mouth daily. Patient not taking: Reported on 04/20/2014 12/25/13   Rich Numberarly Rivet, MD  famotidine (PEPCID) 20 MG tablet TAKE 1 TABLET BY MOUTH TWICE A DAY Patient not taking: Reported on 04/20/2014 10/30/12   Lorretta HarpXilin Niu, MD  ferrous sulfate 325 (65 FE) MG tablet TAKE 1 TABLET BY MOUTH 3 TIMES DAILY WITH MEALS. Patient not taking: Reported on 04/20/2014 07/09/13   Lorretta HarpXilin Niu, MD  pantoprazole (PROTONIX) 20 MG tablet Take 2 tablets (40 mg total) by mouth daily. Patient not taking: Reported on 04/20/2014 06/11/13 06/11/14  Lorretta HarpXilin Niu, MD  PROAIR HFA 108 (90 BASE) MCG/ACT inhaler INHALE 1 PUFF EVERY 6 HOURS AS NEEDED FOR WHEEZE 04/05/14   Rich Numberarly Rivet, MD  sennosides-docusate sodium (SENOKOT-S) 8.6-50 MG tablet Take 1 tablet by mouth daily. Patient not taking: Reported on 04/20/2014 04/11/12   Lorretta HarpXilin Niu, MD  traMADol (ULTRAM) 50 MG tablet Take 1 tablet (50 mg total) by mouth every 6 (six) hours as needed for moderate pain. Patient not taking: Reported on 04/20/2014 12/10/13 12/10/14  Rich Numberarly Rivet, MD    Current Facility-Administered Medications  Medication Dose Route Frequency Provider Last Rate Last Dose  . 0.9 %  sodium chloride infusion   Intravenous Continuous Courtney ParisEden W Jones, MD 125 mL/hr at 04/21/14 0252    . acetaminophen (TYLENOL) tablet 650 mg  650 mg Oral Q6H PRN Courtney ParisEden W Jones, MD       Or  . acetaminophen (TYLENOL) suppository 650 mg  650 mg Rectal Q6H PRN Courtney ParisEden W Jones, MD      . albuterol (PROVENTIL) (2.5 MG/3ML) 0.083% nebulizer solution 3 mL  3 mL Inhalation Q4H PRN Courtney ParisEden W Jones, MD      . amLODipine (NORVASC) tablet 5 mg  5 mg Oral Daily Courtney ParisEden W Jones, MD   5 mg at 04/21/14 1016  . aspirin EC tablet 81 mg  81 mg Oral Daily Courtney ParisEden W Jones, MD   81 mg at 04/21/14 1016  . carvedilol (COREG) tablet 25 mg  25 mg Oral BID WC Courtney ParisEden W Jones, MD   25 mg at 04/21/14 0802  . cefTRIAXone (ROCEPHIN) 1 g in dextrose 5 %  50 mL IVPB - Premix  1 g Intravenous Q24H Inez CatalinaEmily B Mullen, MD      . docusate sodium (COLACE) capsule 100 mg  100 mg Oral Daily PRN Courtney ParisEden W Jones, MD      . heparin injection 5,000 Units  5,000 Units Subcutaneous 3 times per day Courtney ParisEden W Jones, MD   5,000 Units at 04/21/14 0645  . hydrALAZINE (APRESOLINE) tablet 25  mg  25 mg Oral TID Courtney ParisEden W Jones, MD   25 mg at 04/21/14 1016  . insulin aspart (novoLOG) injection 0-9 Units  0-9 Units Subcutaneous 6 times per day Courtney ParisEden W Jones, MD   1 Units at 04/21/14 1228  . isosorbide mononitrate (IMDUR) 24 hr tablet 30 mg  30 mg Oral Daily Courtney ParisEden W Jones, MD   30 mg at 04/21/14 1016  . metroNIDAZOLE (FLAGYL) IVPB 500 mg  500 mg Intravenous Q6H Courtney ParisEden W Jones, MD   500 mg at 04/21/14 1228  . nitroGLYCERIN (NITROSTAT) SL tablet 0.4 mg  0.4 mg Sublingual Q5 min PRN Courtney ParisEden W Jones, MD      . ondansetron [facility](ZOFRAN) tablet 4 mg  4 mg Oral Q6H PRN Courtney ParisEden W Jones, MD       Or  . ondansetron [facility](ZOFRAN) injection 4 mg  4 mg Intravenous Q6H PRN Courtney ParisEden W Jones, MD      . oxyCODONE-acetaminophen (PERCOCET/ROXICET) 5-325 MG per tablet 1 tablet  1 tablet Oral Q6H PRN Hyacinth Meekerasrif Ahmed, MD   1 tablet at 04/21/14 0252  . rosuvastatin (CRESTOR) tablet 20 mg  20 mg Oral q1800 Courtney ParisEden W Jones, MD      . traMADol Janean Sark(ULTRAM) tablet 50 mg  50 mg Oral Q12H PRN Courtney ParisEden W Jones, MD   50 mg at 04/21/14 0031    Allergies as of 04/20/2014 - Review Complete 04/20/2014  Allergen Reaction Noted  . Cozaar Other (See Comments) 02/12/2011  . Lisinopril  05/24/2012    History reviewed. No pertinent family history.  History   Social History  . Marital Status: Widowed    Spouse Name: N/A    Number of Children: N/A  . Years of Education: N/A   Occupational History  . Not on file.   Social History Main Topics  . Smoking status: Never Smoker   . Smokeless tobacco: Not on file  . Alcohol Use: No  . Drug Use: No  . Sexual Activity: Not on file   Other Topics Concern  . Not on file   Social History Narrative    Chinese immigrant   Housewife   Widowed   Lives w/ her son    Review of Systems: ROS Dr. Tasia CatchingsAhmed 04/20/14 reviewed and I agree  Physical Exam: Vital signs in last 24 hours: Temp:  [98.1 F (36.7 C)-98.2 F (36.8 C)] 98.2 F (36.8 C) (12/20 0050) Pulse Rate:  [70-79] 75 (12/20 0050) Resp:  [15-26] 20 (12/20 0050) BP: (127-175)/(52-84) 157/68 mmHg (12/20 0050) SpO2:  [93 %-100 %] 93 % (12/20 0050) Weight:  [52.164 kg (115 lb)] 52.164 kg (115 lb) (12/20 0050) Last BM Date: 04/21/14 General:   Alert,  Well-developed, well-nourished, pleasant and cooperative in NAD Head:  Normocephalic and atraumatic. Eyes:  Sclera clear, no icterus.   Conjunctiva pink. Ears:  Normal auditory acuity. Nose:  No deformity, discharge,  or lesions. Mouth:  No deformity or lesions.  Oropharynx pale and somewhat dry. Neck:  Supple; no masses or thyromegaly. Lungs:  Diminished breath sounds, without respiratory distress.   No wheezes, crackles, or rhonchi. No acute distress. Heart:  Regular rate and rhythm; no murmurs, clicks, rubs,  or gallops. Abdomen:  Soft, mild epigastric tenderness. No masses, hepatosplenomegaly or hernias noted. Normal bowel sounds, without guarding, and without rebound.     Msk:  Symmetrical without gross deformities. Normal posture. Pulses:  Normal pulses noted. Extremities:  Without clubbing or edema. Neurologic:  Alert and  oriented x4;  grossly normal  neurologically. Skin:  Intact without significant lesions or rashes. Cervical Nodes:  No significant cervical adenopathy. Psych:  Alert and cooperative. Normal mood and affect.   Lab Results:  Recent Labs  04/20/14 1810 04/21/14 0526  WBC 10.2 7.7  HGB 9.7* 8.1*  HCT 29.9* 25.5*  PLT 143* 127*   BMET  Recent Labs  04/20/14 1810 04/21/14 0526  NA 137 135*  K 3.9 3.8  CL 101 102  CO2 21 20  GLUCOSE 321* 152*  BUN 22 19  CREATININE 2.14* 2.12*  CALCIUM 9.1 8.1*   LFT  Recent Labs  04/20/14 1810  PROT 7.0   ALBUMIN 3.1*  AST 17  ALT 14  ALKPHOS 65  BILITOT 0.4   PT/INR No results for input(s): LABPROT, INR in the last 72 hours.  Studies/Results: Ct Abdomen Pelvis Wo Contrast  04/20/2014   CLINICAL DATA:  Acute onset of upper abdominal pain and shortness of breath after falling off bed 2 weeks ago. Nausea and vomiting, acute onset. Initial encounter.  EXAM: CT ABDOMEN AND PELVIS WITHOUT CONTRAST  TECHNIQUE: Multidetector CT imaging of the abdomen and pelvis was performed following the standard protocol without IV contrast.  COMPARISON:  Renal ultrasound performed 03/25/2014, and CT of the abdomen and pelvis from 02/09/2004  FINDINGS: A trace right pleural effusion is noted, with mild focal right basilar airspace opacity, possibly reflecting atelectasis or mild pneumonia.  There is an unusual linear focus of hypoattenuation along the hepatic dome. This could be posttraumatic in nature, though somewhat unusual in appearance for blunt trauma. The spleen is grossly unremarkable.  The gallbladder is mildly distended. The common hepatic duct measures 1.7 cm in diameter, with associated intrahepatic biliary ductal dilatation. This raises concern for distal obstruction, though not well characterized on this study. The pancreas and adrenal glands are unremarkable.  Scattered small nonobstructing bilateral renal stones are seen. A 1.3 cm cyst is noted at the lower pole of the right kidney. The kidneys are otherwise grossly unremarkable. Nonspecific perinephric stranding is noted bilaterally. There is no evidence of hydronephrosis. No obstructing ureteral stones are seen.  No free fluid is identified. The small bowel is unremarkable in appearance. The stomach is within normal limits. No acute vascular abnormalities are seen. Relatively diffuse calcification is noted along the abdominal aorta and its branches. Diffuse postoperative changes noted along the pelvic sidewalls bilaterally.  The appendix is difficult to  fully characterize, but there is no evidence of appendicitis. Scattered diverticulosis is noted along the ascending colon. Contrast progresses to the level of the rectum. There is mild diffuse fatty infiltration along the wall of the ascending colon, raising concern for sequelae of chronic inflammation.  The bladder contains a small to moderate amount of air. Some of this air is seen along the posterior wall of the bladder, of uncertain significance. There is mild associated soft tissue stranding, and a small fistula from the sigmoid colon cannot be entirely excluded, though there is no evidence of contrast extending into the bladder. Would correlate for recent Foley catheter placement or findings to suggest fistula; emphysematous cystitis cannot be excluded. No inguinal lymphadenopathy is seen.  No acute osseous abnormalities are identified. There is chronic grade 1 anterolisthesis of L4 on L5. Multilevel vacuum phenomenon is noted along the lumbar spine.  IMPRESSION: 1. Linear focus of hypoattenuation along the hepatic dome could be posttraumatic in nature, though somewhat unusual in appearance for blunt trauma. No significant subcapsular hematoma seen. No additional evidence for traumatic injury within  the abdomen or pelvis. 2. Trace right pleural effusion, with mild focal right basilar airspace opacity, possibly reflecting atelectasis or mild pneumonia. 3. Small to moderate amount of air noted within the bladder. Would correlate for recent Foley catheter placement. Some of this air is noted along the posterior wall of the bladder, with mild associated soft tissue stranding. A small fistula from the sigmoid colon cannot be entirely excluded, though there is no evidence of contrast extending into the bladder. Would correlate for clinical findings to suggest fistula; emphysematous cystitis cannot be excluded. 4. Dilatation of the common hepatic duct to 1.7 cm in maximal diameter, with intrahepatic biliary ductal  dilatation and prominence of the gallbladder, new from the prior study. This raises concern for distal obstruction, not well characterized on the current study. MRCP or ERCP could be considered for further evaluation, as deemed clinically appropriate. 5. Scattered small nonobstructing bilateral renal stones seen. Small right renal cyst noted. 6. Relatively diffuse calcification along the abdominal aorta and its branches. 7. Scattered diverticulosis along the ascending colon, without evidence of diverticulitis. Mild diffuse fatty infiltration along the wall of the ascending colon raises concern for sequelae of chronic inflammation.   Electronically Signed   By: Roanna Raider M.D.   On: 04/20/2014 22:10    Impression:  1.  Pneumaturia.  Patient doesn't have any GI symptoms suggestive of diverticulitis, colitis or imaging characteristics of invasive colonic mass, which are common entities that serve as lead points for colovesicular fistulae.  I don't think patient has a colovesicular fistula. 2.  Dilated bile duct.  This is a chronic finding, albeit more prominent now, extending back to 2005.  Given patient's normal LFTs, I doubt obstructive stone or mass. 3.  Epigastric pain.   4.  Melena.  Is on chronic iron therapy, but given her worsening Hgb and epigastric pain, an upper GI source of pathology (ulcer, etc.) should be considered. 5.  Acute on chronic anemia. 6.  Mild diarrhea.  Plan:  1.  PPI therapy.  If diarrhea persists, consider C. Diff PCR. 2.  Endoscopy tomorrow for further evaluation. 3.  Risks (bleeding, infection, bowel perforation that could require surgery, sedation-related changes in cardiopulmonary systems), benefits (identification and possible treatment of source of symptoms, exclusion of certain causes of symptoms), and alternatives (watchful waiting, radiographic imaging studies, empiric medical treatment) of upper endoscopy (EGD) were explained to family in detail and patient  wishes to proceed.  Patient does not speak English and consent was obtained via discussion of risks and benefits directly with her family, which were in turn explained by them to the patient. 4.  Would not do any further investigation into patient's biliary tract dilatation at the present time, given its relative chronicity and her presently normal LFTs.  Could consider MRI/MRCP as outpatient. 5.  Urology consult for management of pneumaturia.  Don't recommend any specific GI tract investigation into this at the present time.   LOS: 1 day   Pollyanna Levay M  04/21/2014, 12:37 PM

## 2014-04-21 NOTE — Progress Notes (Signed)
On reassessment pt. has exp. wheezes. IV rate to 50cc from 125cc til talk with MD

## 2014-04-21 NOTE — Progress Notes (Signed)
Subjective: Reports she feels better this morning. She still has some nausea. Some shortness of breath. Her abdominal pain has improved. She had some diarrhea yesterday - denies blood.  Objective: Vital signs in last 24 hours: Filed Vitals:   04/20/14 2345 04/21/14 0015 04/21/14 0050 04/21/14 0800  BP: 143/84 145/58 157/68   Pulse: 79 73 75   Temp:   98.2 F (36.8 C)   TempSrc:   Oral   Resp: 20 23 20    Height:    5' (1.524 m)  Weight:   115 lb (52.164 kg)   SpO2: 100% 97% 93%    Weight change:   Intake/Output Summary (Last 24 hours) at 04/21/14 1226 Last data filed at 04/21/14 [id number]  Gross per 24 hour  Intake   1790 ml  Output      0 ml  Net   1790 ml   General Apperance: NAD HEENT: Normocephalic, PERRL, EOMI, anicteric sclera, MMM Neck: Supple, trachea midline Lungs: Clear to auscultation bilaterally. No wheezes, rhonchi or rales. Breathing comfortably Heart: Regular rate and rhythm, no murmur/rub/gallop Abdomen: Soft, mild TTP suprapubic and epigastric, , nondistended, no rebound/guarding Extremities: Normal, atraumatic, warm and well perfused, trace BLE edema Pulses: 2+ throughout Skin: No rashes or lesions Neurologic: Alert and oriented x 3. CNII-XII intact. Normal strength and sensation  Lab Results: Basic Metabolic Panel:  Recent Labs Lab 04/20/14 1810 04/21/14 0526  NA 137 135*  K 3.9 3.8  CL 101 102  CO2 21 20  GLUCOSE 321* 152*  BUN 22 19  CREATININE 2.14* 2.12*  CALCIUM 9.1 8.1*   Liver Function Tests:  Recent Labs Lab 04/20/14 1810  AST 17  ALT 14  ALKPHOS 65  BILITOT 0.4  PROT 7.0  ALBUMIN 3.1*    Recent Labs Lab 04/20/14 1810  LIPASE 46   CBC:  Recent Labs Lab 04/20/14 1810 04/21/14 0526  WBC 10.2 7.7  NEUTROABS 8.9*  --   HGB 9.7* 8.1*  HCT 29.9* 25.5*  MCV 92.0 92.7  PLT 143* 127*   CBG:  Recent Labs Lab 04/20/14 2119 04/21/14 0253 04/21/14 0747 04/21/14 1149  GLUCAP 263* 194* 103* 143*    Urinalysis:  Recent Labs Lab 04/20/14 1822  COLORURINE YELLOW  LABSPEC 1.016  PHURINE 6.5  GLUCOSEU >1000*  HGBUR SMALL*  BILIRUBINUR NEGATIVE  KETONESUR NEGATIVE  PROTEINUR >300*  UROBILINOGEN 0.2  NITRITE POSITIVE*  LEUKOCYTESUR TRACE*   Micro Results: No results found for this or any previous visit (from the past 240 hour(s)). Studies/Results: Ct Abdomen Pelvis Wo Contrast  04/20/2014   CLINICAL DATA:  Acute onset of upper abdominal pain and shortness of breath after falling off bed 2 weeks ago. Nausea and vomiting, acute onset. Initial encounter.  EXAM: CT ABDOMEN AND PELVIS WITHOUT CONTRAST  TECHNIQUE: Multidetector CT imaging of the abdomen and pelvis was performed following the standard protocol without IV contrast.  COMPARISON:  Renal ultrasound performed 03/25/2014, and CT of the abdomen and pelvis from 02/09/2004  FINDINGS: A trace right pleural effusion is noted, with mild focal right basilar airspace opacity, possibly reflecting atelectasis or mild pneumonia.  There is an unusual linear focus of hypoattenuation along the hepatic dome. This could be posttraumatic in nature, though somewhat unusual in appearance for blunt trauma. The spleen is grossly unremarkable.  The gallbladder is mildly distended. The common hepatic duct measures 1.7 cm in diameter, with associated intrahepatic biliary ductal dilatation. This raises concern for distal obstruction, though not well characterized on this  study. The pancreas and adrenal glands are unremarkable.  Scattered small nonobstructing bilateral renal stones are seen. A 1.3 cm cyst is noted at the lower pole of the right kidney. The kidneys are otherwise grossly unremarkable. Nonspecific perinephric stranding is noted bilaterally. There is no evidence of hydronephrosis. No obstructing ureteral stones are seen.  No free fluid is identified. The small bowel is unremarkable in appearance. The stomach is within normal limits. No acute vascular  abnormalities are seen. Relatively diffuse calcification is noted along the abdominal aorta and its branches. Diffuse postoperative changes noted along the pelvic sidewalls bilaterally.  The appendix is difficult to fully characterize, but there is no evidence of appendicitis. Scattered diverticulosis is noted along the ascending colon. Contrast progresses to the level of the rectum. There is mild diffuse fatty infiltration along the wall of the ascending colon, raising concern for sequelae of chronic inflammation.  The bladder contains a small to moderate amount of air. Some of this air is seen along the posterior wall of the bladder, of uncertain significance. There is mild associated soft tissue stranding, and a small fistula from the sigmoid colon cannot be entirely excluded, though there is no evidence of contrast extending into the bladder. Would correlate for recent Foley catheter placement or findings to suggest fistula; emphysematous cystitis cannot be excluded. No inguinal lymphadenopathy is seen.  No acute osseous abnormalities are identified. There is chronic grade 1 anterolisthesis of L4 on L5. Multilevel vacuum phenomenon is noted along the lumbar spine.  IMPRESSION: 1. Linear focus of hypoattenuation along the hepatic dome could be posttraumatic in nature, though somewhat unusual in appearance for blunt trauma. No significant subcapsular hematoma seen. No additional evidence for traumatic injury within the abdomen or pelvis. 2. Trace right pleural effusion, with mild focal right basilar airspace opacity, possibly reflecting atelectasis or mild pneumonia. 3. Small to moderate amount of air noted within the bladder. Would correlate for recent Foley catheter placement. Some of this air is noted along the posterior wall of the bladder, with mild associated soft tissue stranding. A small fistula from the sigmoid colon cannot be entirely excluded, though there is no evidence of contrast extending into the  bladder. Would correlate for clinical findings to suggest fistula; emphysematous cystitis cannot be excluded. 4. Dilatation of the common hepatic duct to 1.7 cm in maximal diameter, with intrahepatic biliary ductal dilatation and prominence of the gallbladder, new from the prior study. This raises concern for distal obstruction, not well characterized on the current study. MRCP or ERCP could be considered for further evaluation, as deemed clinically appropriate. 5. Scattered small nonobstructing bilateral renal stones seen. Small right renal cyst noted. 6. Relatively diffuse calcification along the abdominal aorta and its branches. 7. Scattered diverticulosis along the ascending colon, without evidence of diverticulitis. Mild diffuse fatty infiltration along the wall of the ascending colon raises concern for sequelae of chronic inflammation.   Electronically Signed   By: Roanna Raider M.D.   On: 04/20/2014 22:10   Medications: I have reviewed the patient's current medications. Scheduled Meds: . amLODipine  5 mg Oral Daily  . aspirin EC  81 mg Oral Daily  . carvedilol  25 mg Oral BID WC  . cefTRIAXone (ROCEPHIN)  IV  1 g Intravenous Q24H  . heparin  5,000 Units Subcutaneous 3 times per day  . hydrALAZINE  25 mg Oral TID  . insulin aspart  0-9 Units Subcutaneous 6 times per day  . isosorbide mononitrate  30 mg Oral Daily  .  metronidazole  500 mg Intravenous Q6H  . rosuvastatin  20 mg Oral q1800   Continuous Infusions: . sodium chloride 125 mL/hr at 04/21/14 0252   PRN Meds:.acetaminophen **OR** acetaminophen, albuterol, docusate sodium, nitroGLYCERIN, ondansetron **OR** ondansetron (ZOFRAN) IV, oxyCODONE-acetaminophen, traMADol Assessment/Plan: Active Problems:   Hyperlipidemia   Essential hypertension   Type II diabetes mellitus   Back pain   Fistula, intestinovesical   Abdominal pain, acute   Fistula, bladder   Nausea with vomiting   UTI (lower urinary tract infection)  Recurrent UTI:  UA suggestive of UTI. Last UCX all showing ecoli resistant to cipro/levaquin/bactrim. 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  - f/u UCx - continue rocephin + flagyl - Clear liquid diet, advance diet as tolerated - D/c IV fluids if tolerating diet - zofran prn for nausea/vomitting. N/v likely 2/2 to UTI - consulted urology, appreciate recs: outpatient follow up for cystoscopy   CBD dilation: On CT - 1.7 cm dilated, gallbladder prominence, and intrahepatic biliary duct dilation. T bili wnl.  - GI consulted  DM II: takes novolog 8 units TID + lantus 20 units QHS at home - will just do SSI here since patient is NPO. Can add lantus when eating.  CKD IV - likely 2/2 to DM II. Baseline cr ~2.5-2.9. - Continue to monitor  Chronic normocytic anemia: baseline hgb around 10. Now close to baseline ~9.7.  - continue to monitor  Thrombocytopenia: plt 143. B/l ~110-130's.  - continue to monitor  CAD s/p CABG in 2008, HTN: at home norvasc 5mg , coreg 25mg  BID, hydralazine 25 TID, imdur 30mg  24hr.  - continue norvasc 5mg  daily, coreg 25mg  BID, hydralazine 25mg  TID, imdur 30mg  daily - continue ASA 81mg  daily  HLD: continue home crestor 20mg  daily  Back pain 2/2 to spinal stenosis - continue home tramadol + tylenol.  FEN: Clear liquid diet, advance diet as tolerated.  VTE ppx: subq heparin 5000u TID   Dispo: Disposition is deferred at this time, awaiting improvement of current medical problems.  Anticipated discharge in approximately 1 day(s).   The patient does have a current PCP (Rich Numberarly Rivet, MD) and [facility] after discharge.  The patient does not have transportation limitations that hinder transportation to clinic appointments.  .Services Needed at time of discharge: Y = Yes, Blank = No PT:   OT:   RN:   Equipment:   Other:     LOS: 1 day   Griffin BasilJennifer Shadi Larner, MD 04/21/2014, 12:26 PM

## 2014-04-21 NOTE — Consult Note (Signed)
Urology Consult  Referring physician: Ezzie Dural Reason for referral: Recurrent UTI  Chief Complaint: Recurrent UTI  History of Present Illness: Asked to assess pt with recurrent UTI and possible fistula; admitted with n/v and fever; central and s/p abdominal pain; treated with hydration and iv antibiotics; serum Cr 2.12; no hx of previous GU surgery or stones; ? Recent UTI but limited history of them Does no spring english and lives local Abdominal pain improving Modifying factors: There are no other modifying factors  Associated signs and symptoms: There are no other associated signs and symptoms Aggravating and relieving factors: There are no other aggravating or relieving factors Severity: Moderate Duration: Improving  Past Medical History  Diagnosis Date  . Diabetes mellitus, type 2   . GERD (gastroesophageal reflux disease)   . Hepatitis B   . Hyperlipemia   . Hypertension   . Cervical dysplasia   . Atrophic vaginitis   . Spinal stenosis     s/p decompression  . CAD (coronary artery disease) April 2008    s/p CABG;    cath 8/12:  LM patent, pLAD occluded, OM1 30-40%, pOM2 80% and 60% after the anastomosis, pRCA occluded, S-DX occluded (old), S-OM 2 occluded (new), L-LAD patent, dLAD provided collats to the PDA; Med Rx  rec.; consider PCI to OM2 if fails med Rx  . Hx of hysterectomy   . UTI (lower urinary tract infection)    Past Surgical History  Procedure Laterality Date  . Back surgery      decompression L3-S1  . Coronary artery bypass graft  April 2008  . Total abdominal hysterectomy      cervical dysplasia    Medications: I have reviewed the patient's current medications. Allergies:  Allergies  Allergen Reactions  . Cozaar Other (See Comments)    Sig increase in her creatinine that resolved once it was D/C'd  Per Cr visible in epic, it seems her baseline Cr = 1.7-1.9, and the slight improvement to Cr was an outlier when compared to recent Cr.  We will do a  trial of Lisinopril (and monitor for 30% rise in Cr), as she would greatly benefit from renal protection.  . Lisinopril     Cough     History reviewed. No pertinent family history. Social History:  reports that she has never smoked. She does not have any smokeless tobacco history on file. She reports that she does not drink alcohol or use illicit drugs.  ROS: All systems are reviewed and negative except as noted. Rest negative  Physical Exam:  Vital signs in last 24 hours: Temp:  [98.1 F (36.7 C)-98.2 F (36.8 C)] 98.2 F (36.8 C) (12/20 0050) Pulse Rate:  [70-79] 75 (12/20 0050) Resp:  [15-26] 20 (12/20 0050) BP: (127-175)/(52-84) 157/68 mmHg (12/20 0050) SpO2:  [93 %-100 %] 93 % (12/20 0050) Weight:  [52.164 kg (115 lb)] 52.164 kg (115 lb) (12/20 0050)  Cardiovascular: Skin warm; not flushed Respiratory: Breaths quiet; no shortness of breath Abdomen: No masses Neurological: Normal sensation to touch Musculoskeletal: Normal motor function arms and legs Lymphatics: No inguinal adenopathy Skin: No rashes Genitourinary:no abdominal tenderness; non-toxic  Laboratory Data:  Results for orders placed or performed during the hospital encounter of 04/20/14 (from the past 72 hour(s))  CBC with Differential     Status: Abnormal   Collection Time: 04/20/14  6:10 PM  Result Value Ref Range   WBC 10.2 4.0 - 10.5 K/uL   RBC 3.25 (L) 3.87 - 5.11 MIL/uL  Hemoglobin 9.7 (L) 12.0 - 15.0 g/dL   HCT 29.9 (L) 36.0 - 46.0 %   MCV 92.0 78.0 - 100.0 fL   MCH 29.8 26.0 - 34.0 pg   MCHC 32.4 30.0 - 36.0 g/dL   RDW 13.3 11.5 - 15.5 %   Platelets 143 (L) 150 - 400 K/uL   Neutrophils Relative % 87 (H) 43 - 77 %   Neutro Abs 8.9 (H) 1.7 - 7.7 K/uL   Lymphocytes Relative 7 (L) 12 - 46 %   Lymphs Abs 0.7 0.7 - 4.0 K/uL   Monocytes Relative 6 3 - 12 %   Monocytes Absolute 0.6 0.1 - 1.0 K/uL   Eosinophils Relative 0 0 - 5 %   Eosinophils Absolute 0.0 0.0 - 0.7 K/uL   Basophils Relative 0 0 -  1 %   Basophils Absolute 0.0 0.0 - 0.1 K/uL  Comprehensive metabolic panel     Status: Abnormal   Collection Time: 04/20/14  6:10 PM  Result Value Ref Range   Sodium 137 137 - 147 mEq/L   Potassium 3.9 3.7 - 5.3 mEq/L   Chloride 101 96 - 112 mEq/L   CO2 21 19 - 32 mEq/L   Glucose, Bld 321 (H) 70 - 99 mg/dL   BUN 22 6 - 23 mg/dL   Creatinine, Ser 2.14 (H) 0.50 - 1.10 mg/dL   Calcium 9.1 8.4 - 10.5 mg/dL   Total Protein 7.0 6.0 - 8.3 g/dL   Albumin 3.1 (L) 3.5 - 5.2 g/dL   AST 17 0 - 37 U/L   ALT 14 0 - 35 U/L   Alkaline Phosphatase 65 39 - 117 U/L   Total Bilirubin 0.4 0.3 - 1.2 mg/dL   GFR calc non Af Amer 20 (L) >90 mL/min   GFR calc Af Amer 24 (L) >90 mL/min    Comment: (NOTE) The eGFR has been calculated using the CKD EPI equation. This calculation has not been validated in all clinical situations. eGFR's persistently <90 mL/min signify possible Chronic Kidney Disease.    Anion gap 15 5 - 15  Lipase, blood     Status: None   Collection Time: 04/20/14  6:10 PM  Result Value Ref Range   Lipase 46 11 - 59 U/L  Urinalysis, Routine w reflex microscopic     Status: Abnormal   Collection Time: 04/20/14  6:22 PM  Result Value Ref Range   Color, Urine YELLOW YELLOW   APPearance CLOUDY (A) CLEAR   Specific Gravity, Urine 1.016 1.005 - 1.030   pH 6.5 5.0 - 8.0   Glucose, UA >1000 (A) NEGATIVE mg/dL   Hgb urine dipstick SMALL (A) NEGATIVE   Bilirubin Urine NEGATIVE NEGATIVE   Ketones, ur NEGATIVE NEGATIVE mg/dL   Protein, ur >300 (A) NEGATIVE mg/dL   Urobilinogen, UA 0.2 0.0 - 1.0 mg/dL   Nitrite POSITIVE (A) NEGATIVE   Leukocytes, UA TRACE (A) NEGATIVE  Urine microscopic-add on     Status: Abnormal   Collection Time: 04/20/14  6:22 PM  Result Value Ref Range   Squamous Epithelial / LPF FEW (A) RARE   WBC, UA 11-20 <3 WBC/hpf   RBC / HPF 0-2 <3 RBC/hpf   Bacteria, UA MANY (A) RARE  I-stat troponin, ED (only if pt is 78 y.o. or older & pain is above umbilicus) - do not  order at [facility]     Status: None   Collection Time: 04/20/14  6:27 PM  Result Value Ref Range  Troponin i, poc 0.02 0.00 - 0.08 ng/mL   Comment 3            Comment: Due to the release kinetics of cTnI, a negative result within the first hours of the onset of symptoms does not rule out myocardial infarction with certainty. If myocardial infarction is still suspected, repeat the test at appropriate intervals.   POC CBG, ED     Status: Abnormal   Collection Time: 04/20/14  9:19 PM  Result Value Ref Range   Glucose-Capillary 263 (H) 70 - 99 mg/dL   Comment 1 Notify RN    Comment 2 Documented in Chart   Glucose, capillary     Status: Abnormal   Collection Time: 04/21/14  2:53 AM  Result Value Ref Range   Glucose-Capillary 194 (H) 70 - 99 mg/dL   Comment 1 Notify RN   Basic metabolic panel     Status: Abnormal   Collection Time: 04/21/14  5:26 AM  Result Value Ref Range   Sodium 135 (L) 137 - 147 mEq/L   Potassium 3.8 3.7 - 5.3 mEq/L   Chloride 102 96 - 112 mEq/L   CO2 20 19 - 32 mEq/L   Glucose, Bld 152 (H) 70 - 99 mg/dL   BUN 19 6 - 23 mg/dL   Creatinine, Ser 2.12 (H) 0.50 - 1.10 mg/dL   Calcium 8.1 (L) 8.4 - 10.5 mg/dL   GFR calc non Af Amer 21 (L) >90 mL/min   GFR calc Af Amer 24 (L) >90 mL/min    Comment: (NOTE) The eGFR has been calculated using the CKD EPI equation. This calculation has not been validated in all clinical situations. eGFR's persistently <90 mL/min signify possible Chronic Kidney Disease.    Anion gap 13 5 - 15  CBC     Status: Abnormal   Collection Time: 04/21/14  5:26 AM  Result Value Ref Range   WBC 7.7 4.0 - 10.5 K/uL   RBC 2.75 (L) 3.87 - 5.11 MIL/uL   Hemoglobin 8.1 (L) 12.0 - 15.0 g/dL   HCT 25.5 (L) 36.0 - 46.0 %   MCV 92.7 78.0 - 100.0 fL   MCH 29.5 26.0 - 34.0 pg   MCHC 31.8 30.0 - 36.0 g/dL   RDW 13.4 11.5 - 15.5 %   Platelets 127 (L) 150 - 400 K/uL  Glucose, capillary     Status: Abnormal   Collection Time: 04/21/14  7:47 AM  Result  Value Ref Range   Glucose-Capillary 103 (H) 70 - 99 mg/dL   No results found for this or any previous visit (from the past 240 hour(s)). Creatinine:  Recent Labs  04/20/14 1810 04/21/14 0526  CREATININE 2.14* 2.12*    Xrays: See report/chart Non obst bilateral stones; air in bladder; soft tissue stranding; ? Foley ? Fistula; ? Emph. cystitis  Impression/Assessment:  UTI with Ecoli; findings more in keeping with cystitis and not a fistula  Plan:  Treat UTI; spoke to radiology and don't recommend CT cystogram now due to dye in colon that could give [place] a false positive for fistula Recommend to send home and see me as an outpatient for cystoscopy Phone number given No recent history of foley  MACDIARMID,SCOTT A 04/21/2014, 12:00 PM

## 2014-04-21 NOTE — Progress Notes (Signed)
ANTIBIOTIC CONSULT NOTE - INITIAL  Pharmacy Consult for Ceftriaxone Indication: UTI  Allergies  Allergen Reactions  . Cozaar Other (See Comments)    Sig increase in her creatinine that resolved once it was D/C'd  Per Cr visible in epic, it seems her baseline Cr = 1.7-1.9, and the slight improvement to Cr was an outlier when compared to recent Cr.  We will do a trial of Lisinopril (and monitor for 30% rise in Cr), as she would greatly benefit from renal protection.  . Lisinopril     Cough    Patient Measurements: Height: 5' (152.4 cm) Weight: 115 lb (52.164 kg) IBW/kg (Calculated) : 45.5  Vital Signs: Temp: 98.2 F (36.8 C) (12/20 0050) Temp Source: Oral (12/20 0050) BP: 157/68 mmHg (12/20 0050) Pulse Rate: 75 (12/20 0050)  Labs:  Recent Labs  04/20/14 1810 04/21/14 0526  WBC 10.2 7.7  HGB 9.7* 8.1*  PLT 143* 127*  CREATININE 2.14* 2.12*   Estimated Creatinine Clearance: 14.9 mL/min (by C-G formula based on Cr of 2.12).  Microbiology: No results found for this or any previous visit (from the past 720 hour(s)).  Medical History: Past Medical History  Diagnosis Date  . Diabetes mellitus, type 2   . GERD (gastroesophageal reflux disease)   . Hepatitis B   . Hyperlipemia   . Hypertension   . Cervical dysplasia   . Atrophic vaginitis   . Spinal stenosis     s/p decompression  . CAD (coronary artery disease) April 2008    s/p CABG;    cath 8/12:  LM patent, pLAD occluded, OM1 30-40%, pOM2 80% and 60% after the anastomosis, pRCA occluded, S-DX occluded (old), S-OM 2 occluded (new), L-LAD patent, dLAD provided collats to the PDA; Med Rx  rec.; consider PCI to OM2 if fails med Rx  . Hx of hysterectomy   . UTI (lower urinary tract infection)    Medications:  Anti-infectives    Start     Dose/Rate Route Frequency Ordered Stop   04/21/14 2200  cefTRIAXone (ROCEPHIN) 1 g in dextrose 5 % 50 mL IVPB - Premix     1 g100 mL/hr over 30 Minutes Intravenous Every 24 hours  04/21/14 0219     04/21/14 0600  metroNIDAZOLE (FLAGYL) IVPB 500 mg     500 mg100 mL/hr over 60 Minutes Intravenous Every 6 hours 04/21/14 0215     04/20/14 2245  metroNIDAZOLE (FLAGYL) IVPB 500 mg     500 mg100 mL/hr over 60 Minutes Intravenous  Once 04/20/14 2236 04/21/14 0017   04/20/14 2045  cefTRIAXone (ROCEPHIN) 1 g in dextrose 5 % 50 mL IVPB     1 g100 mL/hr over 30 Minutes Intravenous  Once 04/20/14 2030 04/20/14 2149     Assessment: 80 yo female admitted with upper abd. pain and SOB and vomiting over the last week.  She had a recent fall with bruising.  She was started on IV antibiotics last evening for possible UTI and Metronidazole to cover abdominal issues.  Patient afebrile and her WBC is wnl.  Goal of Therapy:  Therapeutic response to IV antibiotics  Plan:  1.  Ceftriaxone 1 gm IV every 24 hours 2.  Monitor clinical response and f/u LOT  Nadara MustardNita Duc Crocket, PharmD., MS Clinical Pharmacist Pager:  [phone] Thank you for allowing pharmacy to be part of this patients care team.  04/21/2014,10:20 AM

## 2014-04-22 ENCOUNTER — Inpatient Hospital Stay (HOSPITAL_COMMUNITY): Payer: Medicare Other

## 2014-04-22 ENCOUNTER — Encounter (HOSPITAL_COMMUNITY): Payer: Self-pay | Admitting: Anesthesiology

## 2014-04-22 ENCOUNTER — Inpatient Hospital Stay (HOSPITAL_COMMUNITY): Payer: Medicare Other | Admitting: Anesthesiology

## 2014-04-22 ENCOUNTER — Encounter (HOSPITAL_COMMUNITY)
Admission: EM | Disposition: A | Discharge status: Home / Self Care | Payer: Self-pay | Source: Home / Self Care | Attending: Internal Medicine

## 2014-04-22 DIAGNOSIS — R109 Unspecified abdominal pain: Secondary | ICD-10-CM

## 2014-04-22 DIAGNOSIS — D62 Acute posthemorrhagic anemia: Secondary | ICD-10-CM

## 2014-04-22 HISTORY — PX: ESOPHAGOGASTRODUODENOSCOPY: SHX5428

## 2014-04-22 LAB — COMPREHENSIVE METABOLIC PANEL
ALK PHOS: 56 U/L (ref 39–117)
ALT: 12 U/L (ref 0–35)
AST: 16 U/L (ref 0–37)
Albumin: 2.7 g/dL — ABNORMAL LOW (ref 3.5–5.2)
Anion gap: 19 — ABNORMAL HIGH (ref 5–15)
BUN: 24 mg/dL — ABNORMAL HIGH (ref 6–23)
CO2: 16 meq/L — AB (ref 19–32)
Calcium: 8.3 mg/dL — ABNORMAL LOW (ref 8.4–10.5)
Chloride: 102 mEq/L (ref 96–112)
Creatinine, Ser: 2.29 mg/dL — ABNORMAL HIGH (ref 0.50–1.10)
GFR calc Af Amer: 22 mL/min — ABNORMAL LOW (ref >90)
GFR, EST NON AFRICAN AMERICAN: 19 mL/min — AB (ref >90)
Glucose, Bld: 163 mg/dL — ABNORMAL HIGH (ref 70–99)
POTASSIUM: 3.8 meq/L (ref 3.7–5.3)
SODIUM: 137 meq/L (ref 137–147)
Total Bilirubin: 0.3 mg/dL (ref 0.3–1.2)
Total Protein: 6 g/dL (ref 6.0–8.3)

## 2014-04-22 LAB — GLUCOSE, CAPILLARY
Glucose-Capillary: 116 mg/dL — ABNORMAL HIGH (ref 70–99)
Glucose-Capillary: 116 mg/dL — ABNORMAL HIGH (ref 70–99)
Glucose-Capillary: 126 mg/dL — ABNORMAL HIGH (ref 70–99)
Glucose-Capillary: 144 mg/dL — ABNORMAL HIGH (ref 70–99)
Glucose-Capillary: 153 mg/dL — ABNORMAL HIGH (ref 70–99)
Glucose-Capillary: 156 mg/dL — ABNORMAL HIGH (ref 70–99)

## 2014-04-22 LAB — BASIC METABOLIC PANEL
ANION GAP: 16 — AB (ref 5–15)
BUN: 23 mg/dL (ref 6–23)
CHLORIDE: 102 meq/L (ref 96–112)
CO2: 17 mEq/L — ABNORMAL LOW (ref 19–32)
Calcium: 8.3 mg/dL — ABNORMAL LOW (ref 8.4–10.5)
Creatinine, Ser: 2.32 mg/dL — ABNORMAL HIGH (ref 0.50–1.10)
GFR calc non Af Amer: 19 mL/min — ABNORMAL LOW (ref >90)
GFR, EST AFRICAN AMERICAN: 22 mL/min — AB (ref >90)
Glucose, Bld: 144 mg/dL — ABNORMAL HIGH (ref 70–99)
Potassium: 4.2 mEq/L (ref 3.7–5.3)
Sodium: 135 mEq/L — ABNORMAL LOW (ref 137–147)

## 2014-04-22 LAB — CBC
HEMATOCRIT: 25.4 % — AB (ref 36.0–46.0)
Hemoglobin: 8.2 g/dL — ABNORMAL LOW (ref 12.0–15.0)
MCH: 29.6 pg (ref 26.0–34.0)
MCHC: 32.3 g/dL (ref 30.0–36.0)
MCV: 91.7 fL (ref 78.0–100.0)
Platelets: 125 10*3/uL — ABNORMAL LOW (ref 150–400)
RBC: 2.77 MIL/uL — AB (ref 3.87–5.11)
RDW: 13.3 % (ref 11.5–15.5)
WBC: 7 10*3/uL (ref 4.0–10.5)

## 2014-04-22 LAB — TROPONIN I: Troponin I: <0.3 ng/mL (ref <0.30)

## 2014-04-22 LAB — LACTIC ACID, PLASMA: Lactic Acid, Venous: 0.7 mmol/L (ref 0.5–2.2)

## 2014-04-22 SURGERY — EGD (ESOPHAGOGASTRODUODENOSCOPY)
Anesthesia: Monitor Anesthesia Care | Laterality: Left

## 2014-04-22 MED ORDER — HYDROMORPHONE HCL 1 MG/ML IJ SOLN
0.5000 mg | INTRAMUSCULAR | Status: DC | PRN
  Administered 2014-04-22 – 2014-04-23 (×2): 0.5 mg via INTRAVENOUS
  Filled 2014-04-22 (×2): qty 1

## 2014-04-22 MED ORDER — PROPOFOL INFUSION 10 MG/ML OPTIME
INTRAVENOUS | Status: DC | PRN
  Administered 2014-04-22: 50 ug/kg/min via INTRAVENOUS

## 2014-04-22 MED ORDER — PROMETHAZINE HCL 25 MG/ML IJ SOLN
12.5000 mg | Freq: Four times a day (QID) | INTRAMUSCULAR | Status: DC | PRN
  Administered 2014-04-22: 12.5 mg via INTRAVENOUS
  Filled 2014-04-22: qty 1

## 2014-04-22 MED ORDER — LACTATED RINGERS IV SOLN
INTRAVENOUS | Status: DC | PRN
  Administered 2014-04-22: 12:00:00 via INTRAVENOUS

## 2014-04-22 MED ORDER — BUTAMBEN-TETRACAINE-BENZOCAINE 2-2-14 % EX AERO
INHALATION_SPRAY | CUTANEOUS | Status: DC | PRN
  Administered 2014-04-22 (×2): 1 via TOPICAL

## 2014-04-22 MED ORDER — SODIUM CHLORIDE 0.9 % IV SOLN
INTRAVENOUS | Status: DC
  Administered 2014-04-22: 09:00:00 via INTRAVENOUS

## 2014-04-22 NOTE — Brief Op Note (Signed)
Normal endoscopy. Start clear liquid diet. Follow H/Hs.

## 2014-04-22 NOTE — Anesthesia Postprocedure Evaluation (Signed)
Anesthesia Post Note  Patient: Bianca [name]  Procedure(s) Performed: Procedure(s) (LRB): ESOPHAGOGASTRODUODENOSCOPY (EGD) (Left)  Anesthesia type: MAC  Patient location: PACU  Post pain: Pain level controlled  Post assessment: Patient's Cardiovascular Status Stable  Last Vitals:  Filed Vitals:   04/22/14 1250  BP: 158/62  Pulse: 70  Temp:   Resp: 16    Post vital signs: Reviewed and stable  Level of consciousness: sedated  Complications: No apparent anesthesia complications

## 2014-04-22 NOTE — Progress Notes (Signed)
Pt seen and examined with Dr. Andrey CampanileWilson. Case d/w resident and plan was formulated. Please refer to Dr. Joaquin MusicKrall's note for details.  Pt complains of persistent abd pain (upper abd) radiating to her chest associated with nausea but no vomiting.  Physical exam: Gen: AAO*3, noted to be in pain CVS: RRR, normal heart sounds Pulm: CTA b/l Abd: soft, moderate epigastric tenderness +, non distended, voluntary guarding + Ext: no edema  Assessment and Plan: 80y/o female with recurrent UTI, anemia and possible enterovesicular fistula  Recurrent UTI: - Urine cx with E. Coli. Will f/u sensitivities - c/w rocephin. Will consider dc flagyl in AM - C/w IVF - Urology recommends outpatient cystoscopy for possible fistula - No further w/u at this time  Abd pain: - Uncertain etiology. It appears unrelated to her UTI - No acute EKG changes but will check troponins - d/c zofran and start low dose phenergan - EGD wnl.  - Will c/w pain control - Pt with chronic CBD dilatation with normal LFTs. Will need outpatient MRCP. - Will recheck LFTs in AM  Worsening anemia: - Hg stable today with normal EGD - No further w/u at this time. May need outpatient colonoscopy - check iron studies, ferritin, B12, folic acid - It is possible that anemia is secondary to CKD (normocytic) - May benefit from epoetin as an outpatient

## 2014-04-22 NOTE — Op Note (Signed)
[facility] [address] [place], [id number]   ENDOSCOPY PROCEDURE REPORT  PATIENT: [name], Bianca T  MR#: [id number] BIRTHDATE: 12/19/1930 , 83  yrs. old GENDER: female ENDOSCOPIST: Charlott RakesVincent Havier Deeb, MD REFERRED BY: PROCEDURE DATE:  04/22/2014 PROCEDURE:  EGD, diagnostic ASA CLASS:     Class III INDICATIONS:  epigastric abdominal pain and anemia. MEDICATIONS: Monitored anesthesia care and Per Anesthesia TOPICAL ANESTHETIC:  DESCRIPTION OF PROCEDURE: After the risks benefits and alternatives of the procedure were thoroughly explained, informed consent was obtained.  The Pentax Gastroscope [id number] endoscope was introduced through the mouth and advanced to the second portion of the duodenum , Without limitations.  The instrument was slowly withdrawn as the mucosa was fully examined.    Z-line at 42 cm from the incisors. Esophagus normal in appearance. Stomach normal with clear bilious fluid. Examined duodenum normal in appearance.       Retroflexed views revealed no abnormalities. The scope was then withdrawn from the patient and the procedure completed.  COMPLICATIONS: There were no immediate complications.  ENDOSCOPIC IMPRESSION:     Normal EGD  RECOMMENDATIONS:     Supportive care; Clear liquid diet; Follow H/Hs   eSigned:  Charlott RakesVincent Imonie Tuch, MD 04/22/2014 12:38 PM    CC:  CPT CODES: ICD CODES:  The ICD and CPT codes recommended by this software are interpretations from the data that the clinical staff has captured with the software.  The verification of the translation of this report to the ICD and CPT codes and modifiers is the sole responsibility of the health care institution and practicing physician where this report was generated.  [employer] will not be held responsible for the validity of the ICD and CPT codes included on this report.  AMA assumes no liability for data contained or not contained herein. CPT is  a Publishing rights managerregistered trademark of the [employer].

## 2014-04-22 NOTE — Anesthesia Preprocedure Evaluation (Addendum)
Anesthesia Evaluation  Patient identified by MRN, date of birth, ID band Patient awake    Reviewed: Allergy & Precautions, H&P , NPO status , Patient's Chart, lab work & pertinent test results, reviewed documented beta blocker date and time   History of Anesthesia Complications (+) history of anesthetic complications  Airway Mallampati: II  TM Distance: >3 FB Neck ROM: Full    Dental  (+) Edentulous Upper, Edentulous Lower   Pulmonary neg pulmonary ROS,    Pulmonary exam normal       Cardiovascular hypertension, + CAD  s/p CABG;  cath 8/12: LM patent, pLAD occluded, OM1 30-40%, pOM2 80% and 60% after the anastomosis, pRCA occluded, S-DX occluded (old), S-OM 2 occluded (new), L-LAD patent, dLAD provided collats to the PDA; Med Rx rec.; consider PCI to OM2 if fails med Rx   Neuro/Psych negative neurological ROS  negative psych ROS   GI/Hepatic GERD-  ,(+) Hepatitis -  Endo/Other  diabetes, Well Controlled, Type 2  Renal/GU ESRFRenal disease     Musculoskeletal   Abdominal   Peds  Hematology   Anesthesia Other Findings   Reproductive/Obstetrics                           Anesthesia Physical Anesthesia Plan  ASA: III  Anesthesia Plan: MAC   Post-op Pain Management:    Induction: Intravenous  Airway Management Planned: Simple Face Mask  Additional Equipment:   Intra-op Plan:   Post-operative Plan:   Informed Consent: I have reviewed the patients History and Physical, chart, labs and discussed the procedure including the risks, benefits and alternatives for the proposed anesthesia with the patient or authorized representative who has indicated his/her understanding and acceptance.   Dental advisory given  Plan Discussed with: CRNA, Anesthesiologist and Surgeon  Anesthesia Plan Comments: (Daughter present)       Anesthesia Quick Evaluation

## 2014-04-22 NOTE — H&P (View-Only) (Signed)
[facility] Consultation Note  Referring Provider: Dr. Debe Coder (Medical Teaching service) Primary Care Physician:  Rich Number, MD  Reason for Consultation:  Abnormal CT scan  HPI: Bianca [name] is a 80 y.o. female admitted for fevers.  Found to have urinary tract infections, for which she has history of multiple.  Patient does not speak English, and interpretation was provided by family members, over 10 of which were in room with patient at the time of my examination.  Had CT showing air in bladder, and we were asked to see patient for evaluation of possible colovesicular fistula.  Patient has had several days' of epigastric pain with dark stools; she is on oral iron, but as best I can tell, the color of the stool has darkened more over the past several days.  She has also had some solid-predominant dysphagia.  No nausea or vomiting.  Occasional diarrhea with antibiotics.  No change in bowel habits.  No weight loss.  ASA 81/day, no other NSAIDs.  No prior endoscopy or colonoscopy.   Past Medical History  Diagnosis Date  . Diabetes mellitus, type 2   . GERD (gastroesophageal reflux disease)   . Hepatitis B   . Hyperlipemia   . Hypertension   . Cervical dysplasia   . Atrophic vaginitis   . Spinal stenosis     s/p decompression  . CAD (coronary artery disease) April 2008    s/p CABG;    cath 8/12:  LM patent, pLAD occluded, OM1 30-40%, pOM2 80% and 60% after the anastomosis, pRCA occluded, S-DX occluded (old), S-OM 2 occluded (new), L-LAD patent, dLAD provided collats to the PDA; Med Rx  rec.; consider PCI to OM2 if fails med Rx  . Hx of hysterectomy   . UTI (lower urinary tract infection)     Past Surgical History  Procedure Laterality Date  . Back surgery      decompression L3-S1  . Coronary artery bypass graft  April 2008  . Total abdominal hysterectomy      cervical dysplasia    Prior to Admission medications   Medication Sig Start Date End Date Taking?  Authorizing Provider  acetaminophen (TYLENOL) 500 MG tablet Take 2 tablets (1,000 mg total) by mouth every 8 (eight) hours as needed for mild pain. 12/25/13 12/25/14 Yes Carly Rivet, MD  amLODipine (NORVASC) 5 MG tablet Take 1 tablet (5 mg total) by mouth daily. 06/11/13  Yes Lorretta Harp, MD  aspirin EC 81 MG tablet TAKE 1 TABLET BY MOUTH EVERY DAY 01/03/13  Yes Lorretta Harp, MD  calcium citrate-vitamin D (CITRACAL+D) 315-200 MG-UNIT per tablet Take 2 tablets by mouth 2 (two) times daily. 12/10/13  Yes Carly Rivet, MD  carvedilol (COREG) 25 MG tablet TAKE 1 TABLET (25 MG TOTAL) BY MOUTH 2 (TWO) TIMES DAILY WITH A MEAL.   Yes Lorretta Harp, MD  CRESTOR 20 MG tablet TAKE 1 TABLET (20 MG TOTAL) BY MOUTH DAILY. 11/22/13  Yes Levert Feinstein, MD  docusate sodium (COLACE) 100 MG capsule Take 1 capsule (100 mg total) by mouth daily as needed for mild constipation. 12/10/13 12/10/14 Yes Carly Rivet, MD  hydrALAZINE (APRESOLINE) 25 MG tablet Take 1 tablet (25 mg total) by mouth 3 (three) times daily. 06/11/13  Yes Lorretta Harp, MD  isosorbide mononitrate (IMDUR) 30 MG 24 hr tablet TAKE 1 TABLET (30 MG TOTAL) BY MOUTH DAILY. 01/01/14  Yes Carly Rivet, MD  LANTUS 100 UNIT/ML injection INJECT 0.2 MLS (20 UNITS TOTAL) INTO THE SKIN  AT BEDTIME. 09/17/13  Yes Lorretta HarpXilin Niu, MD  nitroGLYCERIN (NITROSTAT) 0.4 MG SL tablet Place 0.4 mg under the tongue every 5 (five) minutes as needed. For chest pain. Maximum of 3 doses   Yes Historical Provider, MD  NOVOLOG FLEXPEN 100 UNIT/ML FlexPen INJECT 8 UNITS INTO THE SKIN THREE TIMES DAILY BEFORE MEALS. 01/08/14  Yes Rich Numberarly Rivet, MD  acetaminophen (TYLENOL) 500 MG tablet Take 1 tablet (500 mg total) by mouth every 6 (six) hours as needed. Patient not taking: Reported on 04/20/2014 06/11/13 06/11/14  Lorretta HarpXilin Niu, MD  B-D ULTRAFINE III SHORT PEN 31G X 8 MM MISC USE AS DIRECTED TO INJECT INSULIN 3 TIMES A DAY 03/12/14   Rich Numberarly Rivet, MD  BAYER CONTOUR NEXT TEST test strip TEST BLOOD SUGAR UP TO 5 TIMES A DAY  04/16/13   Lorretta HarpXilin Niu, MD  calcitRIOL (ROCALTROL) 0.25 MCG capsule Take 2 capsules (0.5 mcg total) by mouth daily. Patient not taking: Reported on 04/20/2014 12/25/13   Rich Numberarly Rivet, MD  famotidine (PEPCID) 20 MG tablet TAKE 1 TABLET BY MOUTH TWICE A DAY Patient not taking: Reported on 04/20/2014 10/30/12   Lorretta HarpXilin Niu, MD  ferrous sulfate 325 (65 FE) MG tablet TAKE 1 TABLET BY MOUTH 3 TIMES DAILY WITH MEALS. Patient not taking: Reported on 04/20/2014 07/09/13   Lorretta HarpXilin Niu, MD  pantoprazole (PROTONIX) 20 MG tablet Take 2 tablets (40 mg total) by mouth daily. Patient not taking: Reported on 04/20/2014 06/11/13 06/11/14  Lorretta HarpXilin Niu, MD  PROAIR HFA 108 (90 BASE) MCG/ACT inhaler INHALE 1 PUFF EVERY 6 HOURS AS NEEDED FOR WHEEZE 04/05/14   Rich Numberarly Rivet, MD  sennosides-docusate sodium (SENOKOT-S) 8.6-50 MG tablet Take 1 tablet by mouth daily. Patient not taking: Reported on 04/20/2014 04/11/12   Lorretta HarpXilin Niu, MD  traMADol (ULTRAM) 50 MG tablet Take 1 tablet (50 mg total) by mouth every 6 (six) hours as needed for moderate pain. Patient not taking: Reported on 04/20/2014 12/10/13 12/10/14  Rich Numberarly Rivet, MD    Current Facility-Administered Medications  Medication Dose Route Frequency Provider Last Rate Last Dose  . 0.9 %  sodium chloride infusion   Intravenous Continuous Courtney ParisEden W Jones, MD 125 mL/hr at 04/21/14 0252    . acetaminophen (TYLENOL) tablet 650 mg  650 mg Oral Q6H PRN Courtney ParisEden W Jones, MD       Or  . acetaminophen (TYLENOL) suppository 650 mg  650 mg Rectal Q6H PRN Courtney ParisEden W Jones, MD      . albuterol (PROVENTIL) (2.5 MG/3ML) 0.083% nebulizer solution 3 mL  3 mL Inhalation Q4H PRN Courtney ParisEden W Jones, MD      . amLODipine (NORVASC) tablet 5 mg  5 mg Oral Daily Courtney ParisEden W Jones, MD   5 mg at 04/21/14 1016  . aspirin EC tablet 81 mg  81 mg Oral Daily Courtney ParisEden W Jones, MD   81 mg at 04/21/14 1016  . carvedilol (COREG) tablet 25 mg  25 mg Oral BID WC Courtney ParisEden W Jones, MD   25 mg at 04/21/14 0802  . cefTRIAXone (ROCEPHIN) 1 g in dextrose 5 %  50 mL IVPB - Premix  1 g Intravenous Q24H Inez CatalinaEmily B Mullen, MD      . docusate sodium (COLACE) capsule 100 mg  100 mg Oral Daily PRN Courtney ParisEden W Jones, MD      . heparin injection 5,000 Units  5,000 Units Subcutaneous 3 times per day Courtney ParisEden W Jones, MD   5,000 Units at 04/21/14 0645  . hydrALAZINE (APRESOLINE) tablet 25  mg  25 mg Oral TID Courtney ParisEden W Jones, MD   25 mg at 04/21/14 1016  . insulin aspart (novoLOG) injection 0-9 Units  0-9 Units Subcutaneous 6 times per day Courtney ParisEden W Jones, MD   1 Units at 04/21/14 1228  . isosorbide mononitrate (IMDUR) 24 hr tablet 30 mg  30 mg Oral Daily Courtney ParisEden W Jones, MD   30 mg at 04/21/14 1016  . metroNIDAZOLE (FLAGYL) IVPB 500 mg  500 mg Intravenous Q6H Courtney ParisEden W Jones, MD   500 mg at 04/21/14 1228  . nitroGLYCERIN (NITROSTAT) SL tablet 0.4 mg  0.4 mg Sublingual Q5 min PRN Courtney ParisEden W Jones, MD      . ondansetron [facility](ZOFRAN) tablet 4 mg  4 mg Oral Q6H PRN Courtney ParisEden W Jones, MD       Or  . ondansetron [facility](ZOFRAN) injection 4 mg  4 mg Intravenous Q6H PRN Courtney ParisEden W Jones, MD      . oxyCODONE-acetaminophen (PERCOCET/ROXICET) 5-325 MG per tablet 1 tablet  1 tablet Oral Q6H PRN Hyacinth Meekerasrif Ahmed, MD   1 tablet at 04/21/14 0252  . rosuvastatin (CRESTOR) tablet 20 mg  20 mg Oral q1800 Courtney ParisEden W Jones, MD      . traMADol Janean Sark(ULTRAM) tablet 50 mg  50 mg Oral Q12H PRN Courtney ParisEden W Jones, MD   50 mg at 04/21/14 0031    Allergies as of 04/20/2014 - Review Complete 04/20/2014  Allergen Reaction Noted  . Cozaar Other (See Comments) 02/12/2011  . Lisinopril  05/24/2012    History reviewed. No pertinent family history.  History   Social History  . Marital Status: Widowed    Spouse Name: N/A    Number of Children: N/A  . Years of Education: N/A   Occupational History  . Not on file.   Social History Main Topics  . Smoking status: Never Smoker   . Smokeless tobacco: Not on file  . Alcohol Use: No  . Drug Use: No  . Sexual Activity: Not on file   Other Topics Concern  . Not on file   Social History Narrative    [place] immigrant   Housewife   Widowed   Lives w/ her son    Review of Systems: ROS Dr. Tasia CatchingsAhmed 04/20/14 reviewed and I agree  Physical Exam: Vital signs in last 24 hours: Temp:  [98.1 F (36.7 C)-98.2 F (36.8 C)] 98.2 F (36.8 C) (12/20 0050) Pulse Rate:  [70-79] 75 (12/20 0050) Resp:  [15-26] 20 (12/20 0050) BP: (127-175)/(52-84) 157/68 mmHg (12/20 0050) SpO2:  [93 %-100 %] 93 % (12/20 0050) Weight:  [52.164 kg (115 lb)] 52.164 kg (115 lb) (12/20 0050) Last BM Date: 04/21/14 General:   Alert,  Well-developed, well-nourished, pleasant and cooperative in NAD Head:  Normocephalic and atraumatic. Eyes:  Sclera clear, no icterus.   Conjunctiva pink. Ears:  Normal auditory acuity. Nose:  No deformity, discharge,  or lesions. Mouth:  No deformity or lesions.  Oropharynx pale and somewhat dry. Neck:  Supple; no masses or thyromegaly. Lungs:  Diminished breath sounds, without respiratory distress.   No wheezes, crackles, or rhonchi. No acute distress. Heart:  Regular rate and rhythm; no murmurs, clicks, rubs,  or gallops. Abdomen:  Soft, mild epigastric tenderness. No masses, hepatosplenomegaly or hernias noted. Normal bowel sounds, without guarding, and without rebound.     Msk:  Symmetrical without gross deformities. Normal posture. Pulses:  Normal pulses noted. Extremities:  Without clubbing or edema. Neurologic:  Alert and  oriented x4;  grossly normal  neurologically. Skin:  Intact without significant lesions or rashes. Cervical Nodes:  No significant cervical adenopathy. Psych:  Alert and cooperative. Normal mood and affect.   Lab Results:  Recent Labs  04/20/14 1810 04/21/14 0526  WBC 10.2 7.7  HGB 9.7* 8.1*  HCT 29.9* 25.5*  PLT 143* 127*   BMET  Recent Labs  04/20/14 1810 04/21/14 0526  NA 137 135*  K 3.9 3.8  CL 101 102  CO2 21 20  GLUCOSE 321* 152*  BUN 22 19  CREATININE 2.14* 2.12*  CALCIUM 9.1 8.1*   LFT  Recent Labs  04/20/14 1810  PROT 7.0   ALBUMIN 3.1*  AST 17  ALT 14  ALKPHOS 65  BILITOT 0.4   PT/INR No results for input(s): LABPROT, INR in the last 72 hours.  Studies/Results: Ct Abdomen Pelvis Wo Contrast  04/20/2014   CLINICAL DATA:  Acute onset of upper abdominal pain and shortness of breath after falling off bed 2 weeks ago. Nausea and vomiting, acute onset. Initial encounter.  EXAM: CT ABDOMEN AND PELVIS WITHOUT CONTRAST  TECHNIQUE: Multidetector CT imaging of the abdomen and pelvis was performed following the standard protocol without IV contrast.  COMPARISON:  Renal ultrasound performed 03/25/2014, and CT of the abdomen and pelvis from 02/09/2004  FINDINGS: A trace right pleural effusion is noted, with mild focal right basilar airspace opacity, possibly reflecting atelectasis or mild pneumonia.  There is an unusual linear focus of hypoattenuation along the hepatic dome. This could be posttraumatic in nature, though somewhat unusual in appearance for blunt trauma. The spleen is grossly unremarkable.  The gallbladder is mildly distended. The common hepatic duct measures 1.7 cm in diameter, with associated intrahepatic biliary ductal dilatation. This raises concern for distal obstruction, though not well characterized on this study. The pancreas and adrenal glands are unremarkable.  Scattered small nonobstructing bilateral renal stones are seen. A 1.3 cm cyst is noted at the lower pole of the right kidney. The kidneys are otherwise grossly unremarkable. Nonspecific perinephric stranding is noted bilaterally. There is no evidence of hydronephrosis. No obstructing ureteral stones are seen.  No free fluid is identified. The small bowel is unremarkable in appearance. The stomach is within normal limits. No acute vascular abnormalities are seen. Relatively diffuse calcification is noted along the abdominal aorta and its branches. Diffuse postoperative changes noted along the pelvic sidewalls bilaterally.  The appendix is difficult to  fully characterize, but there is no evidence of appendicitis. Scattered diverticulosis is noted along the ascending colon. Contrast progresses to the level of the rectum. There is mild diffuse fatty infiltration along the wall of the ascending colon, raising concern for sequelae of chronic inflammation.  The bladder contains a small to moderate amount of air. Some of this air is seen along the posterior wall of the bladder, of uncertain significance. There is mild associated soft tissue stranding, and a small fistula from the sigmoid colon cannot be entirely excluded, though there is no evidence of contrast extending into the bladder. Would correlate for recent Foley catheter placement or findings to suggest fistula; emphysematous cystitis cannot be excluded. No inguinal lymphadenopathy is seen.  No acute osseous abnormalities are identified. There is chronic grade 1 anterolisthesis of L4 on L5. Multilevel vacuum phenomenon is noted along the lumbar spine.  IMPRESSION: 1. Linear focus of hypoattenuation along the hepatic dome could be posttraumatic in nature, though somewhat unusual in appearance for blunt trauma. No significant subcapsular hematoma seen. No additional evidence for traumatic injury within  the abdomen or pelvis. 2. Trace right pleural effusion, with mild focal right basilar airspace opacity, possibly reflecting atelectasis or mild pneumonia. 3. Small to moderate amount of air noted within the bladder. Would correlate for recent Foley catheter placement. Some of this air is noted along the posterior wall of the bladder, with mild associated soft tissue stranding. A small fistula from the sigmoid colon cannot be entirely excluded, though there is no evidence of contrast extending into the bladder. Would correlate for clinical findings to suggest fistula; emphysematous cystitis cannot be excluded. 4. Dilatation of the common hepatic duct to 1.7 cm in maximal diameter, with intrahepatic biliary ductal  dilatation and prominence of the gallbladder, new from the prior study. This raises concern for distal obstruction, not well characterized on the current study. MRCP or ERCP could be considered for further evaluation, as deemed clinically appropriate. 5. Scattered small nonobstructing bilateral renal stones seen. Small right renal cyst noted. 6. Relatively diffuse calcification along the abdominal aorta and its branches. 7. Scattered diverticulosis along the ascending colon, without evidence of diverticulitis. Mild diffuse fatty infiltration along the wall of the ascending colon raises concern for sequelae of chronic inflammation.   Electronically Signed   By: Roanna Raider M.D.   On: 04/20/2014 22:10    Impression:  1.  Pneumaturia.  Patient doesn't have any GI symptoms suggestive of diverticulitis, colitis or imaging characteristics of invasive colonic mass, which are common entities that serve as lead points for colovesicular fistulae.  I don't think patient has a colovesicular fistula. 2.  Dilated bile duct.  This is a chronic finding, albeit more prominent now, extending back to 2005.  Given patient's normal LFTs, I doubt obstructive stone or mass. 3.  Epigastric pain.   4.  Melena.  Is on chronic iron therapy, but given her worsening Hgb and epigastric pain, an upper GI source of pathology (ulcer, etc.) should be considered. 5.  Acute on chronic anemia. 6.  Mild diarrhea.  Plan:  1.  PPI therapy.  If diarrhea persists, consider C. Diff PCR. 2.  Endoscopy tomorrow for further evaluation. 3.  Risks (bleeding, infection, bowel perforation that could require surgery, sedation-related changes in cardiopulmonary systems), benefits (identification and possible treatment of source of symptoms, exclusion of certain causes of symptoms), and alternatives (watchful waiting, radiographic imaging studies, empiric medical treatment) of upper endoscopy (EGD) were explained to family in detail and patient  wishes to proceed.  Patient does not speak English and consent was obtained via discussion of risks and benefits directly with her family, which were in turn explained by them to the patient. 4.  Would not do any further investigation into patient's biliary tract dilatation at the present time, given its relative chronicity and her presently normal LFTs.  Could consider MRI/MRCP as outpatient. 5.  Urology consult for management of pneumaturia.  Don't recommend any specific GI tract investigation into this at the present time.   LOS: 1 day   Christoher Drudge M  04/21/2014, 12:37 PM

## 2014-04-22 NOTE — Progress Notes (Signed)
Subjective: Reports she had abdominal pain this morning with nausea and small amount of emesis. Her dysuria has improved. SOB has also improved. She feels epigastric tightness that radiates up her sternum.  Objective: Vital signs in last 24 hours: Filed Vitals:   04/21/14 0800 04/21/14 1655 04/21/14 2121 04/22/14 0545  BP:  141/70 132/63 129/59  Pulse:  72 71 72  Temp:  99.3 F (37.4 C) 98.7 F (37.1 C) 98.4 F (36.9 C)  TempSrc:  Axillary Oral   Resp:  18 17 16   Height: 5' (1.524 m)     Weight:      SpO2:  97% 97% 98%   Weight change:   Intake/Output Summary (Last 24 hours) at 04/22/14 0953 Last data filed at 04/21/14 1400  Gross per 24 hour  Intake 1230.42 ml  Output      0 ml  Net 1230.42 ml   General Apperance: NAD HEENT: Normocephalic, PERRL, EOMI, anicteric sclera, MMM Neck: Supple, trachea midline Lungs: Clear to auscultation bilaterally. No wheezes, rhonchi or rales. Breathing comfortably Heart: Regular rate and rhythm, no murmur/rub/gallop Abdomen: Soft, mild TTP epigastric, , nondistended, no rebound/guarding Extremities: Normal, atraumatic, warm and well perfused, trace BLE edema Pulses: 2+ throughout Skin: No rashes or lesions Neurologic: Alert and oriented x 3. CNII-XII intact. Normal strength and sensation  Lab Results: Basic Metabolic Panel:  Recent Labs Lab 04/21/14 0526 04/22/14 0443  NA 135* 135*  K 3.8 4.2  CL 102 102  CO2 20 17*  GLUCOSE 152* 144*  BUN 19 23  CREATININE 2.12* 2.32*  CALCIUM 8.1* 8.3*   Liver Function Tests:  Recent Labs Lab 04/20/14 1810  AST 17  ALT 14  ALKPHOS 65  BILITOT 0.4  PROT 7.0  ALBUMIN 3.1*    Recent Labs Lab 04/20/14 1810  LIPASE 46   CBC:  Recent Labs Lab 04/20/14 1810 04/21/14 0526 04/22/14 0753  WBC 10.2 7.7 7.0  NEUTROABS 8.9*  --   --   HGB 9.7* 8.1* 8.2*  HCT 29.9* 25.5* 25.4*  MCV 92.0 92.7 91.7  PLT 143* 127* 125*   CBG:  Recent Labs Lab 04/21/14 1149 04/21/14 1653  04/21/14 2004 04/22/14 0011 04/22/14 0405 04/22/14 0744  GLUCAP 143* 113* 175* 126* 116* 156*   Urinalysis:  Recent Labs Lab 04/20/14 1822  COLORURINE YELLOW  LABSPEC 1.016  PHURINE 6.5  GLUCOSEU >1000*  HGBUR SMALL*  BILIRUBINUR NEGATIVE  KETONESUR NEGATIVE  PROTEINUR >300*  UROBILINOGEN 0.2  NITRITE POSITIVE*  LEUKOCYTESUR TRACE*   Micro Results: No results found for this or any previous visit (from the past 240 hour(s)). Studies/Results: Ct Abdomen Pelvis Wo Contrast  04/20/2014   CLINICAL DATA:  Acute onset of upper abdominal pain and shortness of breath after falling off bed 2 weeks ago. Nausea and vomiting, acute onset. Initial encounter.  EXAM: CT ABDOMEN AND PELVIS WITHOUT CONTRAST  TECHNIQUE: Multidetector CT imaging of the abdomen and pelvis was performed following the standard protocol without IV contrast.  COMPARISON:  Renal ultrasound performed 03/25/2014, and CT of the abdomen and pelvis from 02/09/2004  FINDINGS: A trace right pleural effusion is noted, with mild focal right basilar airspace opacity, possibly reflecting atelectasis or mild pneumonia.  There is an unusual linear focus of hypoattenuation along the hepatic dome. This could be posttraumatic in nature, though somewhat unusual in appearance for blunt trauma. The spleen is grossly unremarkable.  The gallbladder is mildly distended. The common hepatic duct measures 1.7 cm in diameter, with associated  intrahepatic biliary ductal dilatation. This raises concern for distal obstruction, though not well characterized on this study. The pancreas and adrenal glands are unremarkable.  Scattered small nonobstructing bilateral renal stones are seen. A 1.3 cm cyst is noted at the lower pole of the right kidney. The kidneys are otherwise grossly unremarkable. Nonspecific perinephric stranding is noted bilaterally. There is no evidence of hydronephrosis. No obstructing ureteral stones are seen.  No free fluid is identified.  The small bowel is unremarkable in appearance. The stomach is within normal limits. No acute vascular abnormalities are seen. Relatively diffuse calcification is noted along the abdominal aorta and its branches. Diffuse postoperative changes noted along the pelvic sidewalls bilaterally.  The appendix is difficult to fully characterize, but there is no evidence of appendicitis. Scattered diverticulosis is noted along the ascending colon. Contrast progresses to the level of the rectum. There is mild diffuse fatty infiltration along the wall of the ascending colon, raising concern for sequelae of chronic inflammation.  The bladder contains a small to moderate amount of air. Some of this air is seen along the posterior wall of the bladder, of uncertain significance. There is mild associated soft tissue stranding, and a small fistula from the sigmoid colon cannot be entirely excluded, though there is no evidence of contrast extending into the bladder. Would correlate for recent Foley catheter placement or findings to suggest fistula; emphysematous cystitis cannot be excluded. No inguinal lymphadenopathy is seen.  No acute osseous abnormalities are identified. There is chronic grade 1 anterolisthesis of L4 on L5. Multilevel vacuum phenomenon is noted along the lumbar spine.  IMPRESSION: 1. Linear focus of hypoattenuation along the hepatic dome could be posttraumatic in nature, though somewhat unusual in appearance for blunt trauma. No significant subcapsular hematoma seen. No additional evidence for traumatic injury within the abdomen or pelvis. 2. Trace right pleural effusion, with mild focal right basilar airspace opacity, possibly reflecting atelectasis or mild pneumonia. 3. Small to moderate amount of air noted within the bladder. Would correlate for recent Foley catheter placement. Some of this air is noted along the posterior wall of the bladder, with mild associated soft tissue stranding. A small fistula from the  sigmoid colon cannot be entirely excluded, though there is no evidence of contrast extending into the bladder. Would correlate for clinical findings to suggest fistula; emphysematous cystitis cannot be excluded. 4. Dilatation of the common hepatic duct to 1.7 cm in maximal diameter, with intrahepatic biliary ductal dilatation and prominence of the gallbladder, new from the prior study. This raises concern for distal obstruction, not well characterized on the current study. MRCP or ERCP could be considered for further evaluation, as deemed clinically appropriate. 5. Scattered small nonobstructing bilateral renal stones seen. Small right renal cyst noted. 6. Relatively diffuse calcification along the abdominal aorta and its branches. 7. Scattered diverticulosis along the ascending colon, without evidence of diverticulitis. Mild diffuse fatty infiltration along the wall of the ascending colon raises concern for sequelae of chronic inflammation.   Electronically Signed   By: Roanna RaiderJeffery  Fairley M.D.   On: 04/20/2014 22:10   Medications: I have reviewed the patient's current medications. Scheduled Meds: . amLODipine  5 mg Oral Daily  . aspirin EC  81 mg Oral Daily  . carvedilol  25 mg Oral BID WC  . cefTRIAXone (ROCEPHIN)  IV  1 g Intravenous Q24H  . heparin  5,000 Units Subcutaneous 3 times per day  . hydrALAZINE  25 mg Oral TID  . insulin aspart  0-9 Units Subcutaneous 6 times per day  . isosorbide mononitrate  30 mg Oral Daily  . metronidazole  500 mg Intravenous Q6H  . pantoprazole  40 mg Oral QAC breakfast  . rosuvastatin  20 mg Oral q1800   Continuous Infusions: . sodium chloride 100 mL/hr at 04/22/14 0855   PRN Meds:.acetaminophen **OR** acetaminophen, albuterol, docusate sodium, nitroGLYCERIN, ondansetron **OR** ondansetron (ZOFRAN) IV, oxyCODONE-acetaminophen, traMADol Assessment/Plan: Active Problems:   Hyperlipidemia   Essential hypertension   Type II diabetes mellitus   Back pain    Fistula, intestinovesical   Abdominal pain, acute   Fistula, bladder   Nausea with vomiting   UTI (lower urinary tract infection)  Recurrent UTI: UA suggestive of UTI. Last UCX all showing ecoli resistant to cipro/levaquin/bactrim. 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  - f/u UCx - continue rocephin + flagyl - NPO for EGD - NS @ 100  - zofran prn for nausea/vomitting. N/v likely 2/2 to UTI - consulted urology, appreciate recs: outpatient follow up for cystoscopy   CBD dilation: On CT - 1.7 cm dilated, gallbladder prominence, and intrahepatic biliary duct dilation. T bili wnl.  - GI consulted: chronic and unlikely to be obstructive. Recommend outpatient MRI/MRCP  DM II: takes novolog 8 units TID + lantus 20 units QHS at home - SSI. Can add lantus when eating.  CKD IV: likely 2/2 to DM II. Baseline cr ~2.5-2.9. Cr 2.32 today. - Continue to monitor  Acute on Chronic normocytic anemia: baseline hgb around 10. Hgb 8.2 from 8.1. ?melena  - GI consulted, EGD today - continue to monitor  Thrombocytopenia: plt 143. B/l ~110-130's.  - continue to monitor  CAD s/p CABG in 2008, HTN: at home norvasc 5mg , coreg 25mg  BID, hydralazine 25 TID, imdur 30mg  24hr.  - continue norvasc 5mg  daily, coreg 25mg  BID, hydralazine 25mg  TID, imdur 30mg  daily - continue ASA 81mg  daily  HLD: continue home crestor 20mg  daily  Back pain 2/2 to spinal stenosis - continue home tramadol + tylenol.  FEN: NPO for procedure  VTE ppx: subq heparin 5000u TID   Dispo: Disposition is deferred at this time, awaiting improvement of current medical problems.  Anticipated discharge in approximately 1 day(s).   The patient does have a current PCP (Rich Number, MD) and does need an [facility] hospital follow-up appointment after discharge.  The patient does not have transportation limitations that hinder transportation to clinic appointments.  .Services Needed at time of discharge: Y = Yes, Blank =  No PT:   OT:   RN:   Equipment:   Other:     LOS: 2 days   Griffin Basil, MD 04/22/2014, 9:53 AM

## 2014-04-22 NOTE — Progress Notes (Signed)
Patient still complaining of abdominal pain , bladder scan done and noted >500 ml urine, MD made aware In and out cath done and got 600 ml urine.

## 2014-04-22 NOTE — Transfer of Care (Signed)
Immediate Anesthesia Transfer of Care Note  Patient: Bianca [name]  Procedure(s) Performed: Procedure(s): ESOPHAGOGASTRODUODENOSCOPY (EGD) (Left)  Patient Location: Endoscopy Unit  Anesthesia Type:MAC  Level of Consciousness: awake, alert  and oriented  Airway & Oxygen Therapy: Patient Spontanous Breathing and Patient connected to nasal cannula oxygen  Post-op Assessment: Report given to PACU RN and Post -op Vital signs reviewed and stable  Post vital signs: Reviewed and stable  Complications: No apparent anesthesia complications

## 2014-04-22 NOTE — Interval H&P Note (Signed)
History and Physical Interval Note:  04/22/2014 12:18 PM  Bianca BurnetJieh T Kern  has presented today for surgery, with the diagnosis of Epigastric pain, melena, anemia  The various methods of treatment have been discussed with the patient and family. After consideration of risks, benefits and other options for treatment, the patient has consented to  Procedure(s): ESOPHAGOGASTRODUODENOSCOPY (EGD) (Left) as a surgical intervention .  The patient's history has been reviewed, patient examined, no change in status, stable for surgery.  I have reviewed the patient's chart and labs.  Questions were answered to the patient's satisfaction.     Patti Shorb C.

## 2014-04-23 ENCOUNTER — Other Ambulatory Visit: Payer: Self-pay | Admitting: Internal Medicine

## 2014-04-23 ENCOUNTER — Inpatient Hospital Stay (HOSPITAL_COMMUNITY): Payer: Medicare Other

## 2014-04-23 ENCOUNTER — Encounter (HOSPITAL_COMMUNITY): Payer: Self-pay | Admitting: Gastroenterology

## 2014-04-23 DIAGNOSIS — R339 Retention of urine, unspecified: Secondary | ICD-10-CM

## 2014-04-23 LAB — GLUCOSE, CAPILLARY
GLUCOSE-CAPILLARY: 126 mg/dL — AB (ref 70–99)
Glucose-Capillary: 90 mg/dL (ref 70–99)
Glucose-Capillary: 100 mg/dL — ABNORMAL HIGH (ref 70–99)
Glucose-Capillary: 169 mg/dL — ABNORMAL HIGH (ref 70–99)
Glucose-Capillary: 212 mg/dL — ABNORMAL HIGH (ref 70–99)
Glucose-Capillary: 194 mg/dL — ABNORMAL HIGH (ref 70–99)

## 2014-04-23 LAB — CBC
HEMATOCRIT: 25.2 % — AB (ref 36.0–46.0)
HEMOGLOBIN: 8 g/dL — AB (ref 12.0–15.0)
MCH: 29 pg (ref 26.0–34.0)
MCHC: 31.7 g/dL (ref 30.0–36.0)
MCV: 91.3 fL (ref 78.0–100.0)
Platelets: 142 10*3/uL — ABNORMAL LOW (ref 150–400)
RBC: 2.76 MIL/uL — ABNORMAL LOW (ref 3.87–5.11)
RDW: 13.6 % (ref 11.5–15.5)
WBC: 6.7 10*3/uL (ref 4.0–10.5)

## 2014-04-23 LAB — TROPONIN I

## 2014-04-23 LAB — BASIC METABOLIC PANEL
Anion gap: 14 (ref 5–15)
BUN: 24 mg/dL — AB (ref 6–23)
CHLORIDE: 110 meq/L (ref 96–112)
CO2: 14 mmol/L — ABNORMAL LOW (ref 19–32)
CREATININE: 2.84 mg/dL — AB (ref 0.50–1.10)
Calcium: 8.1 mg/dL — ABNORMAL LOW (ref 8.4–10.5)
GFR calc non Af Amer: 15 mL/min — ABNORMAL LOW (ref >90)
GFR, EST AFRICAN AMERICAN: 17 mL/min — AB (ref >90)
GLUCOSE: 103 mg/dL — AB (ref 70–99)
Potassium: 3.8 mmol/L (ref 3.5–5.1)
Sodium: 138 mmol/L (ref 135–145)

## 2014-04-23 LAB — URINE CULTURE: Colony Count: >=100000

## 2014-04-23 MED ORDER — FUROSEMIDE 10 MG/ML IJ SOLN
40.0000 mg | Freq: Once | INTRAMUSCULAR | Status: AC
  Administered 2014-04-23: 40 mg via INTRAVENOUS
  Filled 2014-04-23: qty 4

## 2014-04-23 MED ORDER — IPRATROPIUM-ALBUTEROL 0.5-2.5 (3) MG/3ML IN SOLN
3.0000 mL | Freq: Four times a day (QID) | RESPIRATORY_TRACT | Status: DC
  Administered 2014-04-23 – 2014-04-24 (×4): 3 mL via RESPIRATORY_TRACT
  Filled 2014-04-23 (×4): qty 3

## 2014-04-23 MED ORDER — IPRATROPIUM-ALBUTEROL 0.5-2.5 (3) MG/3ML IN SOLN: 3.0000 mL | Freq: Four times a day (QID) | RESPIRATORY_TRACT | Status: DC | PRN

## 2014-04-23 MED ORDER — FUROSEMIDE 10 MG/ML IJ SOLN
20.0000 mg | Freq: Once | INTRAMUSCULAR | Status: AC
  Administered 2014-04-23: 20 mg via INTRAVENOUS
  Filled 2014-04-23: qty 2

## 2014-04-23 MED ORDER — AMOXICILLIN-POT CLAVULANATE 500-125 MG PO TABS
1.0000 | ORAL_TABLET | Freq: Two times a day (BID) | ORAL | Status: DC
  Administered 2014-04-23 – 2014-04-24 (×3): 500 mg via ORAL
  Filled 2014-04-23 (×5): qty 1

## 2014-04-23 NOTE — Progress Notes (Signed)
Pt seen and examined with Dr. Isabella BowensKrall. Case d/w resident and plan was formulated. Please refer to Dr. Joaquin MusicKrall's note for details.  Pt complains of persistent abd pain (upper abd) radiating to her chest but mildly improved today. No n/v today. Pt with urinary retention requiring catheterizartion with 600 cc out.  Physical exam: Gen: AAO*3, NAD CVS: RRR, normal heart sounds Pulm: b/l diffuse wheezes + Abd: soft, mild epigastric tenderness +, non distended, no rebound/guarding Ext: no edema  Assessment and Plan: 80y/o female with recurrent UTI, anemia and possible enterovesicular fistula  Recurrent UTI: - Urine cx with E. Coli. Sensitivities noted - d/c rocephin and flagyl and continue with augmentin - d/c IVF - Urology recommends outpatient cystoscopy for possible fistula - No further w/u at this time  Wheezing: - CXR with possible CHF exacerbation - will give lasix 20 mg IV *1 - c/w nebs prn - d/c IVF - Pt with likely acute on chronic diastolic HF excaerbation secondary to IVF  Abd pain: - troponins wnl. abd pain has improved - EGD wnl.  - Will c/w pain control - Pt with chronic CBD dilatation with normal LFTs. Will need outpatient MRCP  Worsening anemia: - Hg stable today - No further w/u at this time. May need outpatient colonoscopy - It is possible that anemia is secondary to CKD (normocytic) - May benefit from epoetin as an outpatient

## 2014-04-23 NOTE — Progress Notes (Signed)
Patient ID: Bianca [name], female   DOB: 1930/10/12, 80 y.o.   MRN: [id number] [facility]  Bianca [name] 80 y.o. 1930/10/12   Subjective: Feels ok. Family at bedside. Soft brown stool early this morning.  Objective: Vital signs: Filed Vitals:   04/23/14 0529  BP: 128/56  Pulse: 83  Temp: 98.7 F (37.1 C)  Resp: 16    Physical Exam: Gen: alert, no acute distress  Abd: soft, nontender, nondistended  Lab Results:  Recent Labs  04/22/14 1821 04/23/14 0709  NA 137 138  K 3.8 3.8  CL 102 110  CO2 16* 14*  GLUCOSE 163* 103*  BUN 24* 24*  CREATININE 2.29* 2.84*  CALCIUM 8.3* 8.1*    Recent Labs  04/20/14 1810 04/22/14 1821  AST 17 16  ALT 14 12  ALKPHOS 65 56  BILITOT 0.4 0.3  PROT 7.0 6.0  ALBUMIN 3.1* 2.7*    Recent Labs  04/20/14 1810  04/22/14 0753 04/23/14 0709  WBC 10.2  < > 7.0 6.7  NEUTROABS 8.9*  --   --   --   HGB 9.7*  < > 8.2* 8.0*  HCT 29.9*  < > 25.4* 25.2*  MCV 92.0  < > 91.7 91.3  PLT 143*  < > 125* 142*  < > = values in this interval not displayed.    Assessment/Plan: S/P GI bleed - negative EGD. Hgb 8.0. No visible bleeding. Advance diet. Will sign off. Call if questions.   Abiha Lukehart C. 04/23/2014, 9:47 AM

## 2014-04-23 NOTE — Progress Notes (Signed)
Patient complaining of chest tightness, VSS , Oxygen started at 2LPM, Md made aware, no new orders given, will continue to monitor patient.

## 2014-04-23 NOTE — Progress Notes (Signed)
Subjective:  Urinary retention yesterday - bladder scan with >500cc urine. Cath'd with 600cc urine out. Feels better today. Reports continued abdominal pain without any nausea or vomiting. She also states that it hurts when she breaths.   Objective: Vital signs in last 24 hours: Filed Vitals:   04/22/14 1319 04/22/14 1422 04/22/14 2142 04/23/14 0529  BP: 149/62 131/63 167/62 128/56  Pulse: 75 76 74 83  Temp: 98.4 F (36.9 C) 98.5 F (36.9 C) 98.3 F (36.8 C) 98.7 F (37.1 C)  TempSrc: Oral Oral Oral   Resp: 16 17 16 16   Height:      Weight:      SpO2: 96% 96% 98% 97%   Weight change:   Intake/Output Summary (Last 24 hours) at 04/23/14 1610 Last data filed at 04/23/14 0550  Gross per 24 hour  Intake 2221.67 ml  Output    700 ml  Net 1521.67 ml   General Apperance: NAD HEENT: Normocephalic, PERRL, EOMI, anicteric sclera, MMM Neck: Supple, trachea midline Lungs: Wheezes bilateral lung fields, No rhonchi or rales. Heart: Regular rate and rhythm, no murmur/rub/gallop Abdomen: Soft, mild TTP epigastric, nondistended, no rebound/guarding Extremities: Normal, atraumatic, warm and well perfused, trace BLE edema Pulses: 2+ throughout Skin: No rashes or lesions Neurologic: Alert and oriented x 3. CNII-XII intact. Normal strength and sensation  Lab Results: Basic Metabolic Panel:  Recent Labs Lab 04/22/14 1821 04/23/14 0709  NA 137 138  K 3.8 3.8  CL 102 110  CO2 16* 14*  GLUCOSE 163* 103*  BUN 24* 24*  CREATININE 2.29* 2.84*  CALCIUM 8.3* 8.1*   Liver Function Tests:  Recent Labs Lab 04/20/14 1810 04/22/14 1821  AST 17 16  ALT 14 12  ALKPHOS 65 56  BILITOT 0.4 0.3  PROT 7.0 6.0  ALBUMIN 3.1* 2.7*    Recent Labs Lab 04/20/14 1810  LIPASE 46   CBC:  Recent Labs Lab 04/20/14 1810  04/22/14 0753 04/23/14 0709  WBC 10.2  < > 7.0 6.7  NEUTROABS 8.9*  --   --   --   HGB 9.7*  < > 8.2* 8.0*  HCT 29.9*  < > 25.4* 25.2*  MCV 92.0  < > 91.7 91.3    PLT 143*  < > 125* 142*  < > = values in this interval not displayed. CBG:  Recent Labs Lab 04/22/14 1318 04/22/14 1638 04/22/14 2006 04/23/14 0004 04/23/14 0404 04/23/14 0741  GLUCAP 116* 153* 144* 126* 90 100*   Urinalysis:  Recent Labs Lab 04/20/14 1822  COLORURINE YELLOW  LABSPEC 1.016  PHURINE 6.5  GLUCOSEU >1000*  HGBUR SMALL*  BILIRUBINUR NEGATIVE  KETONESUR NEGATIVE  PROTEINUR >300*  UROBILINOGEN 0.2  NITRITE POSITIVE*  LEUKOCYTESUR TRACE*   Micro Results: Recent Results (from the past 240 hour(s))  Urine culture     Status: None (Preliminary result)   Collection Time: 04/20/14  6:22 PM  Result Value Ref Range Status   Specimen Description URINE, RANDOM  Final   Special Requests ADDED 0241 04/21/14  Final   Culture  Setup Time   Final    04/21/2014 16:52 Performed at [facility]    Colony Count   Final    >=100,000 COLONIES/ML Performed at [facility]    Culture   Final    ESCHERICHIA COLI Performed at [facility]    Report Status PENDING  Incomplete   Studies/Results: [place] Screening Aaa  04/22/2014   CLINICAL DATA:  Abdominal pain, 80 year old female,  history of hypertension, hyperlipidemia, type 2 diabetes  EXAM: ABDOMINAL AORTA SCREENING ULTRASOUND  TECHNIQUE: Ultrasound examination of the abdominal aorta was performed as a screening evaluation for abdominal aortic aneurysm.  COMPARISON:  No similar prior exam is available at this institution for comparison or on [facility] PACS.  FINDINGS: Abdominal Aorta  No aneurysm identified.  Maximum AP  Diameter:  2.3 cm  Maximum TRV  Diameter: 2.4 cm  IMPRESSION: No sonographic evidence for abdominal aortic aneurysm.   Electronically Signed   By: Gretchen  Green M.D.   On: 04/22/2014 21:25   Dg Abd Acute W/chest  04/22/2014   CLINICAL DATA:  Abdominal pain  EXAM: ACUTE ABDOMEN SERIES (ABDOMEN 2 VIEW & CHEST 1 VIEW)  COMPARISON:  Chest radiograph January 15, 2012; CT abdomen and  pelvis April 20, 2014  FINDINGS: PA chest: There is moderate interstitial edema. There is no appreciable airspace consolidation. The heart size is normal. The pulmonary vascularity is within normal limits. Patient is status post coronary artery bypass grafting.  Supine and upright abdomen: There is no appreciable bowel dilatation or air-fluid level. No free air. There are multiple surgical clips in the pelvis. There are scattered foci of contrast in the colon.  IMPRESSION: Overall bowel gas pattern unremarkable. Multiple surgical clips throughout the pelvis. There is moderate interstitial edema. No airspace consolidation.   Electronically Signed   By: William  Woodruff M.D.   On: 04/22/2014 21:45   Medications: I have reviewed the patient's current medications. Scheduled Meds: . amLODipine  5 mg Oral Daily  . aspirin EC  81 mg Oral Daily  . carvedilol  25 mg Oral BID WC  . cefTRIAXone (ROCEPHIN)  IV  1 g Intravenous Q24H  . heparin  5,000 Units Subcutaneous 3 times per day  . hydrALAZINE  25 mg Oral TID  . insulin aspart  0-9 Units Subcutaneous 6 times per day  . isosorbide mononitrate  30 mg Oral Daily  . metronidazole  500 mg Intravenous Q6H  . pantoprazole  40 mg Oral QAC breakfast  . rosuvastatin  20 mg Oral q1800   Continuous Infusions: . sodium chloride 100 mL/hr at 04/22/14 0855   PRN Meds:.acetaminophen **OR** acetaminophen, albuterol, docusate sodium, HYDROmorphone (DILAUDID) injection, nitroGLYCERIN, oxyCODONE-acetaminophen, promethazine, traMADol Assessment/Plan: Active Problems:   Hyperlipidemia   Essential hypertension   Type II diabetes mellitus   Back pain   Fistula, intestinovesical   Abdominal pain, acute   Fistula, bladder   Nausea with vomiting   UTI (lower urinary tract infection)  Recurrent UTI: UA suggestive of UTI. Last UCX all showing ecoli resistant to cipro/levaquin/bactrim. 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 UCx 04/20/2014 with >100K  colonies/ml E coli sensitive to ampicillin, ceftriaxone, nitrofurantoin, bactrim but resistant to cipro, levofloxacin. - Discussed with [facility] pharmacy, will d/c rocephin + flagyl and transition to augmentin 500-125 mg BID for 7 days. - zofran prn for nausea/vomitting. N/v likely 2/2 to UTI - consulted urology, appreciate recs: outpatient follow up for cystoscopy   Wheezing: On albuterol inhaler 1 puff Q6hr prn wheezing at home. Some wheezing noted this morning. Remains on room air. -Albuterol neb 3ml Q4hr prn  -Duoneb 72mTrumRollBeckRedgRen47.8ord Dillsdaily -d/c74m [id number]-CXR  Urinary retention: Abdominal pain62mTrumRollBeckRedgRen47.8ord DillsCa31mTrumRollBeckRedgRen47.8ord [id number]ord DillsrvinMel71a79m<[id number]ord D58mTrumRollBeckRedgRen47.8ord Dills5a36m<71mTrumRollBeckRedgRen47.8ord [id number]<M12mTrumRollBeckRedgRen47.8ord DillsrvinMel88a26m<[id number]<MEASUREMENJXArvinMel27a<MEASUREMENJXArvinMel62a<MEASUREMENJXArvinMel39a<MEASUREMENJXArvinMel58anee SpryorCrumbleOOB, ambulate today.  CBD dilation: On CT - 1.7 cm dilated, gallbladder prominence, and intrahepatic biliary duct dilation. T bili wnl.  - GI consulted: chronic and unlikely to be obstructive. Recommend outpatient MRI/MRCP  DM II: takes  novolog 8 units TID + lantus 20 units QHS at home - SSI. Can add lantus when eating.  CKD IV: likely 2/2 to DM II. Baseline cr ~2.5-2.9. Cr 2.84 today. - Continue to monitor  Acute on Chronic normocytic anemia: baseline hgb around 10. Hgb 8 from 8.2. EGD unremarkable. - continue to monitor  Thrombocytopenia: plt 142. B/l ~110-130's.  - continue to monitor  CAD s/p CABG in 2008, HTN: at home norvasc 5mg , coreg 25mg  BID, hydralazine 25 TID, imdur 30mg  24hr.  - continue norvasc 5mg  daily, coreg 25mg  BID, hydralazine 25mg  TID, imdur 30mg  daily - continue ASA 81mg  daily  HLD: continue home crestor 20mg  daily  Back pain 2/2 to spinal stenosis - continue home tramadol + tylenol.  FEN: Heart healthy  VTE ppx: subq heparin 5000u TID   Dispo: Disposition is deferred at this time, awaiting improvement of current medical problems.  Anticipated discharge in approximately 1 day(s).   The patient does have a current PCP (Rich Numberarly Rivet, MD) and [facility].  The patient does not  have transportation limitations that hinder transportation to clinic appointments.  .Services Needed at time of discharge: Y = Yes, Blank = No PT:   OT:   RN:   Equipment:   Other:     LOS: 3 days   Harold BarbanLawrence Ngo, MD 04/23/2014, 9:22 AM

## 2014-04-24 DIAGNOSIS — R062 Wheezing: Secondary | ICD-10-CM

## 2014-04-24 DIAGNOSIS — K828 Other specified diseases of gallbladder: Secondary | ICD-10-CM

## 2014-04-24 LAB — GLUCOSE, CAPILLARY
GLUCOSE-CAPILLARY: 139 mg/dL — AB (ref 70–99)
GLUCOSE-CAPILLARY: 102 mg/dL — AB (ref 70–99)
Glucose-Capillary: 156 mg/dL — ABNORMAL HIGH (ref 70–99)
Glucose-Capillary: 242 mg/dL — ABNORMAL HIGH (ref 70–99)

## 2014-04-24 LAB — CBC
HEMATOCRIT: 27.6 % — AB (ref 36.0–46.0)
HEMOGLOBIN: 8.9 g/dL — AB (ref 12.0–15.0)
MCH: 29.2 pg (ref 26.0–34.0)
MCHC: 32.2 g/dL (ref 30.0–36.0)
MCV: 90.5 fL (ref 78.0–100.0)
Platelets: 155 10*3/uL (ref 150–400)
RBC: 3.05 MIL/uL — ABNORMAL LOW (ref 3.87–5.11)
RDW: 13.8 % (ref 11.5–15.5)
WBC: 7.7 10*3/uL (ref 4.0–10.5)

## 2014-04-24 LAB — BASIC METABOLIC PANEL
Anion gap: 8 (ref 5–15)
BUN: 24 mg/dL — AB (ref 6–23)
CALCIUM: 8.3 mg/dL — AB (ref 8.4–10.5)
CHLORIDE: 111 meq/L (ref 96–112)
CO2: 20 mmol/L (ref 19–32)
CREATININE: 3.07 mg/dL — AB (ref 0.50–1.10)
GFR calc Af Amer: 15 mL/min — ABNORMAL LOW (ref >90)
GFR calc non Af Amer: 13 mL/min — ABNORMAL LOW (ref >90)
Glucose, Bld: 136 mg/dL — ABNORMAL HIGH (ref 70–99)
Potassium: 3.3 mmol/L — ABNORMAL LOW (ref 3.5–5.1)
Sodium: 139 mmol/L (ref 135–145)

## 2014-04-24 MED ORDER — AMOXICILLIN-POT CLAVULANATE 500-125 MG PO TABS: 1.0000 | ORAL_TABLET | Freq: Two times a day (BID) | ORAL | Status: AC

## 2014-04-24 NOTE — Discharge Instructions (Signed)

## 2014-04-24 NOTE — Progress Notes (Signed)
Discharge instructions explained to pt's son and son verbalizes understanding of all orders/instructions.  All questions answered and denies anymore questions at this time.  IV removed.  VSS. Pt discharged to home with family via volunteer wheelchair services with all belongings. Pt in no s/s of distress. Sherald BargeSpencer, Nazli Penn T

## 2014-04-24 NOTE — Progress Notes (Signed)
Subjective:  Feels a lot better today. SOB is better. She reports her epigastric/chest tightness has also improved. Denies dysuria. Tolerating diet without N/V.   Objective: Vital signs in last 24 hours: Filed Vitals:   04/23/14 2035 04/23/14 2237 04/24/14 0521 04/24/14 0813  BP:  132/61 128/72 154/70  Pulse: 74 74 72 77  Temp:  99.2 F (37.3 C) 98.9 F (37.2 C)   TempSrc:  Oral Oral   Resp: 16 18 18    Height:      Weight:      SpO2:  100% 98%    Weight change:   Intake/Output Summary (Last 24 hours) at 04/24/14 0851 Last data filed at 04/23/14 1700  Gross per 24 hour  Intake    120 ml  Output      0 ml  Net    120 ml   General Apperance: NAD HEENT: Normocephalic, PERRL, EOMI, anicteric sclera, MMM Neck: Supple, trachea midline Lungs: CTAB, No wheezes, rhonchi or rales. Heart: Regular rate and rhythm, no murmur/rub/gallop Abdomen: Soft, nontender, nondistended, no rebound/guarding Extremities: Normal, atraumatic, warm and well perfused, trace BLE edema Pulses: 2+ throughout Skin: No rashes or lesions Neurologic: Alert and oriented x 3. CNII-XII intact. Normal strength and sensation  Lab Results: Basic Metabolic Panel:  Recent Labs Lab 04/23/14 0709 04/24/14 0324  NA 138 139  K 3.8 3.3*  CL 110 111  CO2 14* 20  GLUCOSE 103* 136*  BUN 24* 24*  CREATININE 2.84* 3.07*  CALCIUM 8.1* 8.3*   Liver Function Tests:  Recent Labs Lab 04/20/14 1810 04/22/14 1821  AST 17 16  ALT 14 12  ALKPHOS 65 56  BILITOT 0.4 0.3  PROT 7.0 6.0  ALBUMIN 3.1* 2.7*    Recent Labs Lab 04/20/14 1810  LIPASE 46   CBC:  Recent Labs Lab 04/20/14 1810  04/23/14 0709 04/24/14 0324  WBC 10.2  < > 6.7 7.7  NEUTROABS 8.9*  --   --   --   HGB 9.7*  < > 8.0* 8.9*  HCT 29.9*  < > 25.2* 27.6*  MCV 92.0  < > 91.3 90.5  PLT 143*  < > 142* 155  < > = values in this interval not displayed. CBG:  Recent Labs Lab 04/23/14 1210 04/23/14 1710 04/23/14 2020 04/24/14 0006  04/24/14 0442 04/24/14 0758  GLUCAP 169* 212* 194* 156* 139* 102*   Urinalysis:  Recent Labs Lab 04/20/14 1822  COLORURINE YELLOW  LABSPEC 1.016  PHURINE 6.5  GLUCOSEU >1000*  HGBUR SMALL*  BILIRUBINUR NEGATIVE  KETONESUR NEGATIVE  PROTEINUR >300*  UROBILINOGEN 0.2  NITRITE POSITIVE*  LEUKOCYTESUR TRACE*   Micro Results: Recent Results (from the past 240 hour(s))  Urine culture     Status: None   Collection Time: 04/20/14  6:22 PM  Result Value Ref Range Status   Specimen Description URINE, RANDOM  Final   Special Requests ADDED 0241 04/21/14  Final   Culture  Setup Time   Final    04/21/2014 16:52 Performed at [facility]    Colony Count   Final    >=100,000 COLONIES/ML Performed at [facility]    Culture   Final    ESCHERICHIA COLI Performed at [facility]    Report Status 04/23/2014 FINAL  Final   Organism ID, Bacteria ESCHERICHIA COLI  Final      Susceptibility   Escherichia coli - MIC*    AMPICILLIN 4 SENSITIVE Sensitive     CEFAZOLIN <=4  SENSITIVE Sensitive     CEFTRIAXONE <=1 SENSITIVE Sensitive     CIPROFLOXACIN >=4 RESISTANT Resistant     GENTAMICIN <=1 SENSITIVE Sensitive     LEVOFLOXACIN >=8 RESISTANT Resistant     NITROFURANTOIN <=16 SENSITIVE Sensitive     TOBRAMYCIN <=1 SENSITIVE Sensitive     TRIMETH/SULFA <=20 SENSITIVE Sensitive     PIP/TAZO <=4 SENSITIVE Sensitive     * ESCHERICHIA COLI   Studies/Results: [place] [facility]  04/22/2014   CLINICAL DATA:  Abdominal pain, 80 year old female, history of hypertension, hyperlipidemia, type 2 diabetes  EXAM: ABDOMINAL AORTA SCREENING ULTRASOUND  TECHNIQUE: Ultrasound examination of the abdominal aorta was performed as a screening evaluation for abdominal aortic aneurysm.  COMPARISON:  No similar prior exam is available at this institution for comparison or on [facility].  FINDINGS: Abdominal Aorta  No aneurysm identified.  Maximum AP  Diameter:  2.3 cm  Maximum TRV   Diameter: 2.4 cm  IMPRESSION: No sonographic evidence for abdominal aortic aneurysm.   Electronically Signed   By: Christiana PellantGretchen  Green M.D.   On: 04/22/2014 21:25   Dg Chest Port 1 View  04/23/2014   CLINICAL DATA:  Acute onset wheezing.  Fall 2 weeks prior  EXAM: PORTABLE CHEST - 1 VIEW  COMPARISON:  April 22, 2014  FINDINGS: There is underlying emphysematous change. There is a small right effusion. There is slight interstitial edema. No consolidation. Heart is borderline enlarged with pulmonary vascularity within normal limits. Patient is status post coronary artery bypass grafting. There is extensive atherosclerotic change in the aortic arch region. Bones are osteoporotic. There is degenerative change in both shoulders. No adenopathy.  IMPRESSION: Emphysematous change. There is mild edema with small effusion and mild cardiomegaly consistent with a degree of congestive heart failure superimposed on emphysematous change. No airspace consolidation.   Electronically Signed   By: Bretta BangWilliam  Woodruff M.D.   On: 04/23/2014 13:56   Dg Abd Acute W/chest  04/22/2014   CLINICAL DATA:  Abdominal pain  EXAM: ACUTE ABDOMEN SERIES (ABDOMEN 2 VIEW & CHEST 1 VIEW)  COMPARISON:  Chest radiograph January 15, 2012; CT abdomen and pelvis April 20, 2014  FINDINGS: PA chest: There is moderate interstitial edema. There is no appreciable airspace consolidation. The heart size is normal. The pulmonary vascularity is within normal limits. Patient is status post coronary artery bypass grafting.  Supine and upright abdomen: There is no appreciable bowel dilatation or air-fluid level. No free air. There are multiple surgical clips in the pelvis. There are scattered foci of contrast in the colon.  IMPRESSION: Overall bowel gas pattern unremarkable. Multiple surgical clips throughout the pelvis. There is moderate interstitial edema. No airspace consolidation.   Electronically Signed   By: Bretta BangWilliam  Woodruff M.D.   On: 04/22/2014 21:45     Medications: I have reviewed the patient's current medications. Scheduled Meds: . amLODipine  5 mg Oral Daily  . amoxicillin-clavulanate  1 tablet Oral Q12H  . aspirin EC  81 mg Oral Daily  . carvedilol  25 mg Oral BID WC  . heparin  5,000 Units Subcutaneous 3 times per day  . hydrALAZINE  25 mg Oral TID  . insulin aspart  0-9 Units Subcutaneous 6 times per day  . ipratropium-albuterol  3 mL Nebulization QID  . isosorbide mononitrate  30 mg Oral Daily  . pantoprazole  40 mg Oral QAC breakfast  . rosuvastatin  20 mg Oral q1800   Continuous Infusions:   PRN Meds:.acetaminophen **OR** acetaminophen, albuterol, docusate  sodium, nitroGLYCERIN, oxyCODONE-acetaminophen, promethazine, traMADol Assessment/Plan: Active Problems:   Hyperlipidemia   Essential hypertension   Type II diabetes mellitus   Back pain   Fistula, intestinovesical   Abdominal pain, acute   Fistula, bladder   Nausea with vomiting   UTI (lower urinary tract infection)  Recurrent UTI: UA suggestive of UTI. Last UCX all showing ecoli resistant to cipro/levaquin/bactrim. 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 UCx 04/20/2014 with >100K colonies/ml E coli sensitive to ampicillin, ceftriaxone, nitrofurantoin, bactrim but resistant to cipro, levofloxacin. - Discussed with [facility], will d/c rocephin + flagyl and transition to augmentin 500-125 mg BID for 7 days. - zofran prn for nausea/vomitting - consulted urology, appreciate recs: outpatient follow up for cystoscopy   Wheezing 2/2 acute on diastolic CHF exacerbation: On albuterol inhaler 1 puff Q6hr prn wheezing at home. CXR 04/23/2014 with mild edema with small effusion. Received 20mg  IV lasix yesterday morning and 40mg  IV lasix yesterday evening. Trop neg. Remains on room air. Respiratory exam improved this morning.  -Albuterol neb 3ml Q4hr prn  -Duoneb 3ml 4 times daily  Urinary retention: Abdominal pain yesterday. Resolved -OOB,  ambulate.  CBD dilation: On CT - 1.7 cm dilated, gallbladder prominence, and intrahepatic biliary duct dilation. T bili wnl.  - GI consulted: chronic and unlikely to be obstructive. Recommend outpatient MRI/MRCP  DM II: takes novolog 8 units TID + lantus 20 units QHS at home - SSI. Can add lantus when eating.  CKD IV: likely 2/2 to DM II. Baseline cr ~2.5-2.9. Cr 3.07 today after IV lasix yesterday. - Continue to monitor  Acute on Chronic normocytic anemia: baseline hgb around 10. Hgb 8.9 from 8. EGD unremarkable. - continue to monitor  Thrombocytopenia: plt wnl. B/l ~110-130's.  - continue to monitor  CAD s/p CABG in 2008, HTN: at home norvasc 5mg , coreg 25mg  BID, hydralazine 25 TID, imdur 30mg  24hr.  - continue norvasc 5mg  daily, coreg 25mg  BID, hydralazine 25mg  TID, imdur 30mg  daily - continue ASA 81mg  daily  HLD: continue home crestor 20mg  daily  Back pain 2/2 to spinal stenosis - continue home tramadol + tylenol.  FEN: Heart healthy  VTE ppx: subq heparin 5000u TID   Dispo: Likely home today  The patient does have a current PCP (Rich Numberarly Rivet, MD) and [facility] after discharge.  The patient does not have transportation limitations that hinder transportation to clinic appointments.  .Services Needed at time of discharge: Y = Yes, Blank = No PT:   OT:   RN:   Equipment:   Other:     LOS: 4 days   Griffin BasilJennifer Krall, MD 04/24/2014, 8:51 AM

## 2014-04-24 NOTE — Progress Notes (Signed)
Pt seen and examined with Dr. Isabella BowensKrall. Case d/w resident and plan was formulated. Please refer to Dr. Joaquin MusicKrall's note for details.  Pt is much improved today. Abd pain has resolved. Tolerating PO. No fevers.  Physical exam: Gen: AAO*3, NAD CVS: RRR, normal heart sounds Pulm: CTA b/l Abd: soft, non tender, non distended, no rebound/guarding Ext: no edema  Assessment and Plan: 80y/o female with recurrent UTI, anemia and possible enterovesicular fistula  Recurrent UTI: - Urine cx with E. Coli. Sensitivities noted -  Continue with augmentin for 7 days - Urology recommends outpatient cystoscopy for possible fistula - No further w/u at this time  Wheezing: - s/p lasix 20 mg and wheezing has resolved - c/w nebs prn - Pt with likely acute on chronic diastolic HF excaerbation secondary to IVF which has now resolved  Abd pain: - troponins wnl. abd pain has improved - EGD wnl.  - Will c/w pain control - Pt with chronic CBD dilatation with normal LFTs. Will need outpatient MRCP  Worsening anemia: - Hg stable today - No further w/u at this time. May need outpatient colonoscopy - It is possible that anemia is secondary to CKD (normocytic) - May benefit from epoetin as an outpatient  CKD stage 4: - Pt with mildly worsened creatinine today after repeat lasix - Will need repeat BMP on [facility]  Pt is stable for d/c home today

## 2014-04-24 NOTE — Progress Notes (Signed)
PT Cancellation Note  Patient Details Name: Donavan BurnetJieh T Honse MRN: [id number] DOB: May 26, 1930   Cancelled Treatment:    Reason Eval/Treat Not Completed: PT screened, no needs identified, will sign off   Son present and reports pt has been walking in room without problem. Reports 3 steps to enter home and he has assisted pt PTA. Pt agrees she has no difficulty with walking.   Kalaysia Demonbreun 04/24/2014, 10:41 AM  Pager [phone]

## 2014-04-27 NOTE — Discharge Summary (Signed)
Name: Bianca [name] MRN: [id number] DOB: 12/12/1930 80 y.o. PCP: Albin Felling, MD  Date of Admission: 04/20/2014  5:51 PM Date of Discharge: 04/24/2014 Attending Physician: Aldine Contes, MD  Discharge Diagnosis: Active Problems:   Hyperlipidemia   Essential hypertension   Type II diabetes mellitus   Back pain   Fistula, intestinovesical   Abdominal pain, acute   Fistula, bladder   Nausea with vomiting   UTI (lower urinary tract infection)  Discharge Medications:   Medication List    STOP taking these medications        famotidine 20 MG tablet  Commonly known as:  PEPCID      TAKE these medications        acetaminophen 500 MG tablet  Commonly known as:  TYLENOL  Take 2 tablets (1,000 mg total) by mouth every 8 (eight) hours as needed for mild pain.     amLODipine 5 MG tablet  Commonly known as:  NORVASC  Take 1 tablet (5 mg total) by mouth daily.     aspirin EC 81 MG tablet  TAKE 1 TABLET BY MOUTH EVERY DAY     B-D ULTRAFINE III SHORT PEN 31G X 8 MM Misc  Generic drug:  Insulin Pen Needle  USE AS DIRECTED TO INJECT INSULIN 3 TIMES A DAY     BAYER CONTOUR NEXT TEST test strip  Generic drug:  glucose blood  TEST BLOOD SUGAR UP TO 5 TIMES A DAY     calcitRIOL 0.25 MCG capsule  Commonly known as:  ROCALTROL  Take 2 capsules (0.5 mcg total) by mouth daily.     calcium citrate-vitamin D 315-200 MG-UNIT per tablet  Commonly known as:  CITRACAL+D  Take 2 tablets by mouth 2 (two) times daily.     carvedilol 25 MG tablet  Commonly known as:  COREG  TAKE 1 TABLET (25 MG TOTAL) BY MOUTH 2 (TWO) TIMES DAILY WITH A MEAL.     CRESTOR 20 MG tablet  Generic drug:  rosuvastatin  TAKE 1 TABLET (20 MG TOTAL) BY MOUTH DAILY.     docusate sodium 100 MG capsule  Commonly known as:  COLACE  Take 1 capsule (100 mg total) by mouth daily as needed for mild constipation.     ferrous sulfate 325 (65 FE) MG tablet  TAKE 1 TABLET BY MOUTH 3 TIMES DAILY WITH MEALS.     hydrALAZINE 25 MG tablet  Commonly known as:  APRESOLINE  Take 1 tablet (25 mg total) by mouth 3 (three) times daily.     isosorbide mononitrate 30 MG 24 hr tablet  Commonly known as:  IMDUR  TAKE 1 TABLET (30 MG TOTAL) BY MOUTH DAILY.     LANTUS 100 UNIT/ML injection  Generic drug:  insulin glargine  INJECT 0.2 MLS (20 UNITS TOTAL) INTO THE SKIN AT BEDTIME.     nitroGLYCERIN 0.4 MG SL tablet  Commonly known as:  NITROSTAT  Place 0.4 mg under the tongue every 5 (five) minutes as needed. For chest pain. Maximum of 3 doses     NOVOLOG FLEXPEN 100 UNIT/ML FlexPen  Generic drug:  insulin aspart  INJECT 8 UNITS INTO THE SKIN THREE TIMES DAILY BEFORE MEALS.     pantoprazole 20 MG tablet  Commonly known as:  PROTONIX  Take 2 tablets (40 mg total) by mouth daily.     PROAIR HFA 108 (90 BASE) MCG/ACT inhaler  Generic drug:  albuterol  INHALE 1 PUFF EVERY 6 HOURS AS NEEDED FOR  WHEEZE     sennosides-docusate sodium 8.6-50 MG tablet  Commonly known as:  SENOKOT-S  Take 1 tablet by mouth daily.     traMADol 50 MG tablet  Commonly known as:  ULTRAM  Take 1 tablet (50 mg total) by mouth every 6 (six) hours as needed for moderate pain.        Disposition and follow-up:   [name] was discharged from [facility] in Stable condition.  At the hospital follow up visit please address:  1.  Recurrent UTI: CT concerning for free air in bladder - enterovesicular fistula vs emphysematous cystitis. Urology was consulted and recommended outpatient follow up for cystoscopy. UCx 04/20/2014 with >100K colonies/ml E coli sensitive to ampicillin, ceftriaxone, nitrofurantoin, bactrim but resistant to cipro, levofloxacin. Discussed with ID pharmacist and discharged on Augmentin 500-181m BID for 7 days antibiotic course. CBD dilation: On CT - 1.7 cm dilated, gallbladder prominence, and intrahepatic biliary duct dilation. Recommended outpatient MRI/MRCP by GI. Acute on Chronic  normocytic anemia: baseline hgb around 10. Remained stable around 8 during hospitalization. EGD unremarkable. Will need colonoscopy.  2.  Labs / imaging needed at time of follow-up: CMP, MRCP, CBC  3.  Pending labs/ test needing follow-up: None  Follow-up Appointments:     Follow-up Information    Follow up with KBlain Pais MD On 05/01/2014.   Specialty:  Internal Medicine   Why:  3:45PM for hospital follow up   Contact information:   [place] [phone]      Follow up with MACDIARMID,SCOTT A, MD.   Specialty:  Urology   Why:  The office will call you to set up an outpatient cystoscopy. If you do not hear from them in the next 1-2 days, please call to schedule this appointment.   Contact information:   [place] [phone]      Discharge Instructions: Discharge Instructions    Call MD for:  difficulty breathing, headache or visual disturbances    Complete by:  As directed      Call MD for:  persistant dizziness or light-headedness    Complete by:  As directed      Call MD for:  persistant nausea and vomiting    Complete by:  As directed      Call MD for:  severe uncontrolled pain    Complete by:  As directed      Call MD for:  temperature >100.4    Complete by:  As directed      Diet - low sodium heart healthy    Complete by:  As directed      Increase activity slowly    Complete by:  As directed            Consultations: Treatment Team:  VLear Ng MD  Procedures Performed:  Ct Abdomen Pelvis Wo Contrast  04/20/2014   CLINICAL DATA:  Acute onset of upper abdominal pain and shortness of breath after falling off bed 2 weeks ago. Nausea and vomiting, acute onset. Initial encounter.  EXAM: CT ABDOMEN AND PELVIS WITHOUT CONTRAST  TECHNIQUE: Multidetector CT imaging of the abdomen and pelvis was performed following the standard protocol without IV contrast.  COMPARISON:  Renal ultrasound  performed 03/25/2014, and CT of the abdomen and pelvis from 02/09/2004  FINDINGS: A trace right pleural effusion is noted, with mild focal right basilar airspace opacity, possibly reflecting atelectasis or mild pneumonia.  There is an unusual linear focus of hypoattenuation along the hepatic dome. This could be posttraumatic in nature, though somewhat unusual in appearance for blunt trauma. The spleen is grossly unremarkable.  The gallbladder is mildly distended. The common hepatic duct measures 1.7 cm in diameter, with associated intrahepatic biliary ductal dilatation. This raises concern for distal obstruction, though not well characterized on this study. The pancreas and adrenal glands are unremarkable.  Scattered small nonobstructing bilateral renal stones are seen. A 1.3 cm cyst is noted at the lower pole of the right kidney. The kidneys are otherwise grossly unremarkable. Nonspecific perinephric stranding is noted bilaterally. There is no evidence of hydronephrosis. No obstructing ureteral stones are seen.  No free fluid is identified. The small bowel is unremarkable in appearance. The stomach is within normal limits. No acute vascular abnormalities are seen. Relatively diffuse calcification is noted along the abdominal aorta and its branches. Diffuse postoperative changes noted along the pelvic sidewalls bilaterally.  The appendix is difficult to fully characterize, but there is no evidence of appendicitis. Scattered diverticulosis is noted along the ascending colon. Contrast progresses to the level of the rectum. There is mild diffuse fatty infiltration along the wall of the ascending colon, raising concern for sequelae of chronic inflammation.  The bladder contains a small to moderate amount of air. Some of this air is seen along the posterior wall of the bladder, of uncertain significance. There is mild associated soft tissue stranding, and a small fistula from the sigmoid colon cannot be entirely  excluded, though there is no evidence of contrast extending into the bladder. Would correlate for recent Foley catheter placement or findings to suggest fistula; emphysematous cystitis cannot be excluded. No inguinal lymphadenopathy is seen.  No acute osseous abnormalities are identified. There is chronic grade 1 anterolisthesis of L4 on L5. Multilevel vacuum phenomenon is noted along the lumbar spine.  IMPRESSION: 1. Linear focus of hypoattenuation along the hepatic dome could be posttraumatic in nature, though somewhat unusual in appearance for blunt trauma. No significant subcapsular hematoma seen. No additional evidence for traumatic injury within the abdomen or pelvis. 2. Trace right pleural effusion, with mild focal right basilar airspace opacity, possibly reflecting atelectasis or mild pneumonia. 3. Small to moderate amount of air noted within the bladder. Would correlate for recent Foley catheter placement. Some of this air is noted along the posterior wall of the bladder, with mild associated soft tissue stranding. A small fistula from the sigmoid colon cannot be entirely excluded, though there is no evidence of contrast extending into the bladder. Would correlate for clinical findings to suggest fistula; emphysematous cystitis cannot be excluded. 4. Dilatation of the common hepatic duct to 1.7 cm in maximal diameter, with intrahepatic biliary ductal dilatation and prominence of the gallbladder, new from the prior study. This raises concern for distal obstruction, not well characterized on the current study. MRCP or ERCP could be considered for further evaluation, as deemed clinically appropriate. 5. Scattered small nonobstructing bilateral renal stones seen. Small right renal cyst noted. 6. Relatively diffuse calcification along the abdominal aorta and its branches. 7. Scattered diverticulosis along the ascending colon, without evidence of diverticulitis. Mild diffuse fatty infiltration along the wall of  the ascending colon raises concern for sequelae of chronic inflammation.   Electronically Signed   By: Garald Balding M.D.   On: 04/20/2014 22:10   [place] Screening Aaa  04/22/2014   CLINICAL DATA:  Abdominal pain, 80 year old female, history of hypertension, hyperlipidemia, type 2 diabetes  EXAM: ABDOMINAL AORTA SCREENING ULTRASOUND  TECHNIQUE: Ultrasound examination of the abdominal aorta was performed as a screening evaluation for abdominal aortic aneurysm.  COMPARISON:  No similar prior exam is available at this institution for comparison or on [facility] PACS.  FINDINGS: Abdominal Aorta  No aneurysm identified.  Maximum AP  Diameter:  2.3 cm  Maximum TRV  Diameter: 2.4 cm  IMPRESSION: No sonographic evidence for abdominal aortic aneurysm.   Electronically Signed   By: Conchita Paris M.D.   On: 04/22/2014 21:25   Dg Chest Port 1 View  04/23/2014   CLINICAL DATA:  Acute onset wheezing.  Fall 2 weeks prior  EXAM: PORTABLE CHEST - 1 VIEW  COMPARISON:  April 22, 2014  FINDINGS: There is underlying emphysematous change. There is a small right effusion. There is slight interstitial edema. No consolidation. Heart is borderline enlarged with pulmonary vascularity within normal limits. Patient is status post coronary artery bypass grafting. There is extensive atherosclerotic change in the aortic arch region. Bones are osteoporotic. There is degenerative change in both shoulders. No adenopathy.  IMPRESSION: Emphysematous change. There is mild edema with small effusion and mild cardiomegaly consistent with a degree of congestive heart failure superimposed on emphysematous change. No airspace consolidation.   Electronically Signed   By: Lowella Grip M.D.   On: 04/23/2014 13:56   Dg Abd Acute W/chest  04/22/2014   CLINICAL DATA:  Abdominal pain  EXAM: ACUTE ABDOMEN SERIES (ABDOMEN 2 VIEW & CHEST 1 VIEW)  COMPARISON:  Chest radiograph January 15, 2012; CT abdomen and pelvis April 20, 2014  FINDINGS: PA  chest: There is moderate interstitial edema. There is no appreciable airspace consolidation. The heart size is normal. The pulmonary vascularity is within normal limits. Patient is status post coronary artery bypass grafting.  Supine and upright abdomen: There is no appreciable bowel dilatation or air-fluid level. No free air. There are multiple surgical clips in the pelvis. There are scattered foci of contrast in the colon.  IMPRESSION: Overall bowel gas pattern unremarkable. Multiple surgical clips throughout the pelvis. There is moderate interstitial edema. No airspace consolidation.   Electronically Signed   By: Lowella Grip M.D.   On: 04/22/2014 21:45    Admission HPI: 80 yo female with DM II, HTN, CKD, frequent UTI's came in with 2 days of nausea/vomitting (7 yesterday, 7 today), nonbloody, nonbiliary, and not feeling well. Has suprapubic area pain and also some epigastric pain. Has pain with urination, no change in urine color or smell. Had subjective fever but afebrile here. Doesn't eat much at baseline and hasn't been able to eat much last 2 days. Had 1 loose stool in the ED, nonbloody.   Had incidental fall 1 week ago, falling from her bed and hitting her head/face on a table, had bruising on her face. No loc, no weakness, numbness, tingling, vision changes. Mental status at her baseline per family. Her face and forehead area hurts and has a bruise on the left forehead.  Lives with son and daughter in law. Has lot of family support.   Frequent UTI's in the past. All E.coli, resistant to cipro, levaquin, and bactrim. Was treated with fosfomycin 3 grams in the clinic last on 01/2014. Repeat UA on 03/2014 showed clearing. Seen at clinic on 04/02/14 by Dr. Marvel Plan with intermittent dysuria.  Hospital Course by problem list: Active Problems:   Hyperlipidemia   Essential hypertension   Type II diabetes mellitus   Back pain   Fistula, intestinovesical  Abdominal pain, acute   Fistula,  bladder   Nausea with vomiting   UTI (lower urinary tract infection)   1. Recurrent UTI: UA suggestive of UTI. Last UCx's with e. coli resistant to cipro/levaquin/bactrim. No CVA tenderness, has n/v, afebrile, no wbc count, no other signs of sepsis. Did have subjective fever at home.  CT concerning for free air in bladder - enterovesicular fistula vs emphysematous cystitis. She was started on rocephin + flagyl for anaerobic coverage. Urology was consulted and recommended outpatient follow up for cystoscopy (Recent contrast from CT scan precluded performing CT cystogram while inpatient). UCx 04/20/2014 with >100K colonies/ml E coli sensitive to ampicillin, ceftriaxone, nitrofurantoin, bactrim but resistant to cipro, levofloxacin. Discussed with ID pharmacist and discontinued rocephin and flagyl. Transitioned to Augmentin 500-160m BID for 7 days antibiotic course. At discharge her abdominal pain, N/V, and dysuria were resolved.   2. CBD dilation: On CT - 1.7 cm dilated, gallbladder prominence, and intrahepatic biliary duct dilation. T bili wnl.She was seen in consultation by GI. Chronic and unlikely to be obstructive. Recommend outpatient MRI/MRCP  3. Wheezing 2/2 acute on diastolic CHF exacerbation: Likely due to IV fluid administration. CXR 04/23/2014 with mild edema with small effusion. Received 260mIV lasix in the morning and 4052mV lasix in the evening. Trop neg. Remains on room air. Respiratory exam improved the following morning.  4. DM II: takes novolog 8 units TID + lantus 20 units QHS at home. She was restarted on her home medications upon discharge  5. CKD IV - likely 2/2 to DM II. Baseline cr ~2.5-2.9. Increased to Cr 3.07 after IV lasix yesterday. Will need recheck of cr at outpatient follow up.  6. Acute on Chronic normocytic anemia: baseline hgb around 10. Hgb 8.9 from 8. EGD unremarkable.  7. CAD s/p CABG in 2008, HTN: at home on norvasc 5mg43moreg 25mg69m, hydralazine 25 TID,  imdur 30mg 63m. Continued these as well as home ASA 81mg d55m during hospitalization.  Discharge Vitals:   BP 139/55 mmHg  Pulse 77  Temp(Src) 98.9 F (37.2 C) (Oral)  Resp 18  Ht 5' (1.524 m)  Wt 116 lb (52.617 kg)  BMI 22.65 kg/m2  SpO2 98%  Discharge Labs:  Recent Results (from the past 2160 hour(s))  Glucose, capillary     Status: Abnormal   Collection Time: 02/14/14  3:25 PM  Result Value Ref Range   Glucose-Capillary 344 (H) 70 - 99 mg/dL   Comment 1 Documented in Chart   Urinalysis, Reflex Microscopic     Status: Abnormal   Collection Time: 02/14/14  3:30 PM  Result Value Ref Range   Color, Urine YELLOW YELLOW   APPearance TURBID (A) CLEAR   Specific Gravity, Urine 1.023 1.005 - 1.030   pH 5.0 5.0 - 8.0   Glucose, UA > 1000 (A) NEG mg/dL   Bilirubin Urine NEG NEG   Ketones, ur NEG NEG mg/dL   Hgb urine dipstick SMALL (A) NEG   Protein, ur > 300 (A) NEG mg/dL   Urobilinogen, UA 0.2 0.0 - 1.0 mg/dL   Nitrite POS (A) NEG   Leukocytes, UA NEG NEG  Culture, Urine     Status: None   Collection Time: 02/14/14  3:30 PM  Result Value Ref Range   Culture ESCHERICHIA COLI    Colony Count >=100,000 COLONIES/ML    Organism ID, Bacteria ESCHERICHIA COLI       Susceptibility   Escherichia coli -  (no method available)  AMPICILLIN 4 Sensitive     AMOX/CLAVULANIC <=2 Sensitive     AMPICILLIN/SULBACTAM <=2 Sensitive     PIP/TAZO <=4 Sensitive     IMIPENEM <=0.25 Sensitive     CEFAZOLIN 8 Sensitive     CEFTRIAXONE <=1 Sensitive     CEFTAZIDIME <=1 Sensitive     CEFEPIME <=1 Sensitive     GENTAMICIN <=1 Sensitive     TOBRAMYCIN <=1 Sensitive     CIPROFLOXACIN >=4 Resistant     LEVOFLOXACIN >=8 Resistant     NITROFURANTOIN <=16 Sensitive     TRIMETH/SULFA >=320 Resistant   Urine Microscopic     Status: None   Collection Time: 02/14/14  3:30 PM  Result Value Ref Range   Squamous Epithelial / LPF FEW RARE   Crystals NONE SEEN NONE SEEN   Casts NONE SEEN NONE SEEN    WBC, UA 0-2 <3 WBC/hpf   RBC / HPF 0-2 <3 RBC/hpf   Bacteria, UA RARE RARE  CBC with Diff     Status: Abnormal   Collection Time: 02/14/14  4:00 PM  Result Value Ref Range   WBC 4.2 4.0 - 10.5 K/uL   RBC 3.46 (L) 3.87 - 5.11 MIL/uL   Hemoglobin 10.5 (L) 12.0 - 15.0 g/dL   HCT 30.9 (L) 36.0 - 46.0 %   MCV 89.3 78.0 - 100.0 fL   MCH 30.3 26.0 - 34.0 pg   MCHC 34.0 30.0 - 36.0 g/dL   RDW 14.0 11.5 - 15.5 %   Platelets 121 (L) 150 - 400 K/uL   Neutrophils Relative % 77 43 - 77 %   Neutro Abs 3.2 1.7 - 7.7 K/uL   Lymphocytes Relative 13 12 - 46 %   Lymphs Abs 0.5 (L) 0.7 - 4.0 K/uL   Monocytes Relative 9 3 - 12 %   Monocytes Absolute 0.4 0.1 - 1.0 K/uL   Eosinophils Relative 1 0 - 5 %   Eosinophils Absolute 0.0 0.0 - 0.7 K/uL   Basophils Relative 0 0 - 1 %   Basophils Absolute 0.0 0.0 - 0.1 K/uL   Smear Review Criteria for review not met   BMP with Estimated GFR ([id number])     Status: Abnormal   Collection Time: 02/14/14  4:00 PM  Result Value Ref Range   Sodium 137 135 - 145 mEq/L   Potassium 4.0 3.5 - 5.3 mEq/L   Chloride 101 96 - 112 mEq/L   CO2 24 19 - 32 mEq/L   Glucose, Bld 378 (H) 70 - 99 mg/dL   BUN 33 (H) 6 - 23 mg/dL   Creat 2.95 (H) 0.50 - 1.10 mg/dL   Calcium 8.5 8.4 - 10.5 mg/dL   GFR, Est African American 17 (L) mL/min   GFR, Est Non African American 14 (L) mL/min    Comment:   The estimated GFR is a calculation valid for adults (>=85 years old) that uses the CKD-EPI algorithm to adjust for age and sex. It is   not to be used for children, pregnant women, hospitalized patients,    patients on dialysis, or with rapidly changing kidney function. According to the NKDEP, eGFR >89 is normal, 60-89 shows mild impairment, 30-59 shows moderate impairment, 15-29 shows severe impairment and <15 is ESRD.    BMP with Estimated GFR ([id number])     Status: Abnormal   Collection Time: 02/21/14  2:13 PM  Result Value Ref Range   Sodium 136 135 - 145 mEq/L   Potassium 4.2  3.5 - 5.3 mEq/L   Chloride 104 96 - 112 mEq/L   CO2 23 19 - 32 mEq/L   Glucose, Bld 280 (H) 70 - 99 mg/dL   BUN 35 (H) 6 - 23 mg/dL   Creat 2.76 (H) 0.50 - 1.10 mg/dL   Calcium 8.9 8.4 - 10.5 mg/dL   GFR, Est African American 18 (L) mL/min   GFR, Est Non African American 16 (L) mL/min    Comment:   The estimated GFR is a calculation valid for adults (>=70 years old) that uses the CKD-EPI algorithm to adjust for age and sex. It is   not to be used for children, pregnant women, hospitalized patients,    patients on dialysis, or with rapidly changing kidney function. According to the NKDEP, eGFR >89 is normal, 60-89 shows mild impairment, 30-59 shows moderate impairment, 15-29 shows severe impairment and <15 is ESRD.    Glucose, capillary     Status: Abnormal   Collection Time: 02/21/14  2:24 PM  Result Value Ref Range   Glucose-Capillary 299 (H) 70 - 99 mg/dL  POCT urinalysis dipstick     Status: None   Collection Time: 02/21/14  2:41 PM  Result Value Ref Range   Color, UA Yellow    Clarity, UA Clear    Glucose, UA 500    Bilirubin, UA Negative    Ketones, UA Negative    Spec Grav, UA 1.020    Blood, UA Trace-lysed    pH, UA 7.0    Protein, UA >=300    Urobilinogen, UA 0.2    Nitrite, UA Negative    Leukocytes, UA Negative   CBC with Differential     Status: Abnormal   Collection Time: 04/20/14  6:10 PM  Result Value Ref Range   WBC 10.2 4.0 - 10.5 K/uL   RBC 3.25 (L) 3.87 - 5.11 MIL/uL   Hemoglobin 9.7 (L) 12.0 - 15.0 g/dL   HCT 29.9 (L) 36.0 - 46.0 %   MCV 92.0 78.0 - 100.0 fL   MCH 29.8 26.0 - 34.0 pg   MCHC 32.4 30.0 - 36.0 g/dL   RDW 13.3 11.5 - 15.5 %   Platelets 143 (L) 150 - 400 K/uL   Neutrophils Relative % 87 (H) 43 - 77 %   Neutro Abs 8.9 (H) 1.7 - 7.7 K/uL   Lymphocytes Relative 7 (L) 12 - 46 %   Lymphs Abs 0.7 0.7 - 4.0 K/uL   Monocytes Relative 6 3 - 12 %   Monocytes Absolute 0.6 0.1 - 1.0 K/uL   Eosinophils Relative 0 0 - 5 %   Eosinophils Absolute  0.0 0.0 - 0.7 K/uL   Basophils Relative 0 0 - 1 %   Basophils Absolute 0.0 0.0 - 0.1 K/uL  Comprehensive metabolic panel     Status: Abnormal   Collection Time: 04/20/14  6:10 PM  Result Value Ref Range   Sodium 137 137 - 147 mEq/L   Potassium 3.9 3.7 - 5.3 mEq/L   Chloride 101 96 - 112 mEq/L   CO2 21 19 - 32 mEq/L   Glucose, Bld 321 (H) 70 - 99 mg/dL   BUN 22 6 - 23 mg/dL   Creatinine, Ser 2.14 (H) 0.50 - 1.10 mg/dL   Calcium 9.1 8.4 - 10.5 mg/dL   Total Protein 7.0 6.0 - 8.3 g/dL   Albumin 3.1 (L) 3.5 - 5.2 g/dL   AST 17 0 - 37 U/L   ALT 14 0 -  35 U/L   Alkaline Phosphatase 65 39 - 117 U/L   Total Bilirubin 0.4 0.3 - 1.2 mg/dL   GFR calc non Af Amer 20 (L) >90 mL/min   GFR calc Af Amer 24 (L) >90 mL/min    Comment: (NOTE) The eGFR has been calculated using the CKD EPI equation. This calculation has not been validated in all clinical situations. eGFR's persistently <90 mL/min signify possible Chronic Kidney Disease.    Anion gap 15 5 - 15  Lipase, blood     Status: None   Collection Time: 04/20/14  6:10 PM  Result Value Ref Range   Lipase 46 11 - 59 U/L  Urinalysis, Routine w reflex microscopic     Status: Abnormal   Collection Time: 04/20/14  6:22 PM  Result Value Ref Range   Color, Urine YELLOW YELLOW   APPearance CLOUDY (A) CLEAR   Specific Gravity, Urine 1.016 1.005 - 1.030   pH 6.5 5.0 - 8.0   Glucose, UA >1000 (A) NEGATIVE mg/dL   Hgb urine dipstick SMALL (A) NEGATIVE   Bilirubin Urine NEGATIVE NEGATIVE   Ketones, ur NEGATIVE NEGATIVE mg/dL   Protein, ur >300 (A) NEGATIVE mg/dL   Urobilinogen, UA 0.2 0.0 - 1.0 mg/dL   Nitrite POSITIVE (A) NEGATIVE   Leukocytes, UA TRACE (A) NEGATIVE  Urine microscopic-add on     Status: Abnormal   Collection Time: 04/20/14  6:22 PM  Result Value Ref Range   Squamous Epithelial / LPF FEW (A) RARE   WBC, UA 11-20 <3 WBC/hpf   RBC / HPF 0-2 <3 RBC/hpf   Bacteria, UA MANY (A) RARE  Urine culture     Status: None   Collection  Time: 04/20/14  6:22 PM  Result Value Ref Range   Specimen Description URINE, RANDOM    Special Requests ADDED [id number] 04/21/14    Culture  Setup Time      04/21/2014 16:52 Performed at [facility]      >=100,000 COLONIES/ML Performed at [place] Performed at [facility]    Report Status 04/23/2014 FINAL    Organism ID, Bacteria ESCHERICHIA COLI       Susceptibility   Escherichia coli - MIC*    AMPICILLIN 4 SENSITIVE Sensitive     CEFAZOLIN <=4 SENSITIVE Sensitive     CEFTRIAXONE <=1 SENSITIVE Sensitive     CIPROFLOXACIN >=4 RESISTANT Resistant     GENTAMICIN <=1 SENSITIVE Sensitive     LEVOFLOXACIN >=8 RESISTANT Resistant     NITROFURANTOIN <=16 SENSITIVE Sensitive     TOBRAMYCIN <=1 SENSITIVE Sensitive     TRIMETH/SULFA <=20 SENSITIVE Sensitive     PIP/TAZO <=4 SENSITIVE Sensitive     * ESCHERICHIA COLI  I-stat troponin, ED (only if pt is 78 y.o. or older & pain is above umbilicus) - do not order at [facility]     Status: None   Collection Time: 04/20/14  6:27 PM  Result Value Ref Range   Troponin i, poc 0.02 0.00 - 0.08 ng/mL   Comment 3            Comment: Due to the release kinetics of cTnI, a negative result within the first hours of the onset of symptoms does not rule out myocardial infarction with certainty. If myocardial infarction is still suspected, repeat the test at appropriate intervals.   POC CBG, ED     Status:  Abnormal   Collection Time: 04/20/14  9:19 PM  Result Value Ref Range   Glucose-Capillary 263 (H) 70 - 99 mg/dL   Comment 1 Notify RN    Comment 2 Documented in Chart   Glucose, capillary     Status: Abnormal   Collection Time: 04/21/14  2:53 AM  Result Value Ref Range   Glucose-Capillary 194 (H) 70 - 99 mg/dL   Comment 1 Notify RN   Basic metabolic panel     Status: Abnormal   Collection Time: 04/21/14  5:26 AM  Result Value Ref Range   Sodium 135 (L) 137 - 147 mEq/L     Potassium 3.8 3.7 - 5.3 mEq/L   Chloride 102 96 - 112 mEq/L   CO2 20 19 - 32 mEq/L   Glucose, Bld 152 (H) 70 - 99 mg/dL   BUN 19 6 - 23 mg/dL   Creatinine, Ser 2.12 (H) 0.50 - 1.10 mg/dL   Calcium 8.1 (L) 8.4 - 10.5 mg/dL   GFR calc non Af Amer 21 (L) >90 mL/min   GFR calc Af Amer 24 (L) >90 mL/min    Comment: (NOTE) The eGFR has been calculated using the CKD EPI equation. This calculation has not been validated in all clinical situations. eGFR's persistently <90 mL/min signify possible Chronic Kidney Disease.    Anion gap 13 5 - 15  CBC     Status: Abnormal   Collection Time: 04/21/14  5:26 AM  Result Value Ref Range   WBC 7.7 4.0 - 10.5 K/uL   RBC 2.75 (L) 3.87 - 5.11 MIL/uL   Hemoglobin 8.1 (L) 12.0 - 15.0 g/dL   HCT 25.5 (L) 36.0 - 46.0 %   MCV 92.7 78.0 - 100.0 fL   MCH 29.5 26.0 - 34.0 pg   MCHC 31.8 30.0 - 36.0 g/dL   RDW 13.4 11.5 - 15.5 %   Platelets 127 (L) 150 - 400 K/uL  Hemoglobin A1c     Status: Abnormal   Collection Time: 04/21/14  5:26 AM  Result Value Ref Range   Hgb A1c MFr Bld 8.1 (H) <5.7 %    Comment: (NOTE)                                                                       According to the ADA Clinical Practice Recommendations for 2011, when HbA1c is used as a screening test:  >=6.5%   Diagnostic of Diabetes Mellitus           (if abnormal result is confirmed) 5.7-6.4%   Increased risk of developing Diabetes Mellitus References:Diagnosis and Classification of Diabetes Mellitus,Diabetes LKTG,2563,89(HTDSK 1):S62-S69 and Standards of Medical Care in         Diabetes - 2011,Diabetes Care,2011,34 (Suppl 1):S11-S61.    Mean Plasma Glucose 186 (H) <117 mg/dL    Comment: Performed at [facility]  Glucose, capillary     Status: Abnormal   Collection Time: 04/21/14  7:47 AM  Result Value Ref Range   Glucose-Capillary 103 (H) 70 - 99 mg/dL  Glucose, capillary     Status: Abnormal   Collection Time: 04/21/14 11:49 AM  Result Value Ref Range    Glucose-Capillary 143 (H) 70 - 99 mg/dL  Glucose, capillary  Status: Abnormal   Collection Time: 04/21/14  4:53 PM  Result Value Ref Range   Glucose-Capillary 113 (H) 70 - 99 mg/dL  Glucose, capillary     Status: Abnormal   Collection Time: 04/21/14  8:04 PM  Result Value Ref Range   Glucose-Capillary 175 (H) 70 - 99 mg/dL   Comment 1 Notify RN    Comment 2 Documented in Chart   Glucose, capillary     Status: Abnormal   Collection Time: 04/22/14 12:11 AM  Result Value Ref Range   Glucose-Capillary 126 (H) 70 - 99 mg/dL   Comment 1 Notify RN    Comment 2 Documented in Chart   Glucose, capillary     Status: Abnormal   Collection Time: 04/22/14  4:05 AM  Result Value Ref Range   Glucose-Capillary 116 (H) 70 - 99 mg/dL   Comment 1 Notify RN    Comment 2 Documented in Chart   Basic metabolic panel     Status: Abnormal   Collection Time: 04/22/14  4:43 AM  Result Value Ref Range   Sodium 135 (L) 137 - 147 mEq/L   Potassium 4.2 3.7 - 5.3 mEq/L    Comment: HEMOLYSIS AT THIS LEVEL MAY AFFECT RESULT   Chloride 102 96 - 112 mEq/L   CO2 17 (L) 19 - 32 mEq/L   Glucose, Bld 144 (H) 70 - 99 mg/dL   BUN 23 6 - 23 mg/dL   Creatinine, Ser 2.32 (H) 0.50 - 1.10 mg/dL   Calcium 8.3 (L) 8.4 - 10.5 mg/dL   GFR calc non Af Amer 19 (L) >90 mL/min   GFR calc Af Amer 22 (L) >90 mL/min    Comment: (NOTE) The eGFR has been calculated using the CKD EPI equation. This calculation has not been validated in all clinical situations. eGFR's persistently <90 mL/min signify possible Chronic Kidney Disease.    Anion gap 16 (H) 5 - 15  Glucose, capillary     Status: Abnormal   Collection Time: 04/22/14  7:44 AM  Result Value Ref Range   Glucose-Capillary 156 (H) 70 - 99 mg/dL  CBC     Status: Abnormal   Collection Time: 04/22/14  7:53 AM  Result Value Ref Range   WBC 7.0 4.0 - 10.5 K/uL   RBC 2.77 (L) 3.87 - 5.11 MIL/uL   Hemoglobin 8.2 (L) 12.0 - 15.0 g/dL   HCT 25.4 (L) 36.0 - 46.0 %   MCV  91.7 78.0 - 100.0 fL   MCH 29.6 26.0 - 34.0 pg   MCHC 32.3 30.0 - 36.0 g/dL   RDW 13.3 11.5 - 15.5 %   Platelets 125 (L) 150 - 400 K/uL  Glucose, capillary     Status: Abnormal   Collection Time: 04/22/14  1:18 PM  Result Value Ref Range   Glucose-Capillary 116 (H) 70 - 99 mg/dL  Glucose, capillary     Status: Abnormal   Collection Time: 04/22/14  4:38 PM  Result Value Ref Range   Glucose-Capillary 153 (H) 70 - 99 mg/dL   Comment 1 Notify RN   Lactic acid, plasma     Status: None   Collection Time: 04/22/14  5:50 PM  Result Value Ref Range   Lactic Acid, Venous 0.7 0.5 - 2.2 mmol/L  Comprehensive metabolic panel     Status: Abnormal   Collection Time: 04/22/14  6:21 PM  Result Value Ref Range   Sodium 137 137 - 147 mEq/L   Potassium 3.8 3.7 - 5.3  mEq/L   Chloride 102 96 - 112 mEq/L   CO2 16 (L) 19 - 32 mEq/L   Glucose, Bld 163 (H) 70 - 99 mg/dL   BUN 24 (H) 6 - 23 mg/dL   Creatinine, Ser 2.29 (H) 0.50 - 1.10 mg/dL   Calcium 8.3 (L) 8.4 - 10.5 mg/dL   Total Protein 6.0 6.0 - 8.3 g/dL   Albumin 2.7 (L) 3.5 - 5.2 g/dL   AST 16 0 - 37 U/L   ALT 12 0 - 35 U/L   Alkaline Phosphatase 56 39 - 117 U/L   Total Bilirubin 0.3 0.3 - 1.2 mg/dL   GFR calc non Af Amer 19 (L) >90 mL/min   GFR calc Af Amer 22 (L) >90 mL/min    Comment: (NOTE) The eGFR has been calculated using the CKD EPI equation. This calculation has not been validated in all clinical situations. eGFR's persistently <90 mL/min signify possible Chronic Kidney Disease.    Anion gap 19 (H) 5 - 15  Troponin I (q 6hr x 3)     Status: None   Collection Time: 04/22/14  6:21 PM  Result Value Ref Range   Troponin I <0.30 <0.30 ng/mL    Comment:        Due to the release kinetics of cTnI, a negative result within the first hours of the onset of symptoms does not rule out myocardial infarction with certainty. If myocardial infarction is still suspected, repeat the test at appropriate intervals.   Glucose, capillary      Status: Abnormal   Collection Time: 04/22/14  8:06 PM  Result Value Ref Range   Glucose-Capillary 144 (H) 70 - 99 mg/dL   Comment 1 Notify RN    Comment 2 Documented in Chart   Glucose, capillary     Status: Abnormal   Collection Time: 04/23/14 12:04 AM  Result Value Ref Range   Glucose-Capillary 126 (H) 70 - 99 mg/dL  Troponin I (q 6hr x 3)     Status: None   Collection Time: 04/23/14  1:35 AM  Result Value Ref Range   Troponin I <0.03 <0.031 ng/mL    Comment:        NO INDICATION OF MYOCARDIAL INJURY. Please note change in reference range.   Glucose, capillary     Status: None   Collection Time: 04/23/14  4:04 AM  Result Value Ref Range   Glucose-Capillary 90 70 - 99 mg/dL   Comment 1 Notify RN    Comment 2 Documented in Chart   CBC     Status: Abnormal   Collection Time: 04/23/14  7:09 AM  Result Value Ref Range   WBC 6.7 4.0 - 10.5 K/uL   RBC 2.76 (L) 3.87 - 5.11 MIL/uL   Hemoglobin 8.0 (L) 12.0 - 15.0 g/dL   HCT 25.2 (L) 36.0 - 46.0 %   MCV 91.3 78.0 - 100.0 fL   MCH 29.0 26.0 - 34.0 pg   MCHC 31.7 30.0 - 36.0 g/dL   RDW 13.6 11.5 - 15.5 %   Platelets 142 (L) 150 - 400 K/uL  Basic metabolic panel     Status: Abnormal   Collection Time: 04/23/14  7:09 AM  Result Value Ref Range   Sodium 138 135 - 145 mmol/L    Comment: Please note change in reference range.   Potassium 3.8 3.5 - 5.1 mmol/L    Comment: Please note change in reference range.   Chloride 110 96 - 112 mEq/L  CO2 14 (L) 19 - 32 mmol/L   Glucose, Bld 103 (H) 70 - 99 mg/dL   BUN 24 (H) 6 - 23 mg/dL   Creatinine, Ser 2.84 (H) 0.50 - 1.10 mg/dL   Calcium 8.1 (L) 8.4 - 10.5 mg/dL   GFR calc non Af Amer 15 (L) >90 mL/min   GFR calc Af Amer 17 (L) >90 mL/min    Comment: (NOTE) The eGFR has been calculated using the CKD EPI equation. This calculation has not been validated in all clinical situations. eGFR's persistently <90 mL/min signify possible Chronic Kidney Disease.    Anion gap 14 5 - 15    Troponin I (q 6hr x 3)     Status: None   Collection Time: 04/23/14  7:09 AM  Result Value Ref Range   Troponin I <0.03 <0.031 ng/mL    Comment:        NO INDICATION OF MYOCARDIAL INJURY. Please note change in reference range.   Glucose, capillary     Status: Abnormal   Collection Time: 04/23/14  7:41 AM  Result Value Ref Range   Glucose-Capillary 100 (H) 70 - 99 mg/dL  Glucose, capillary     Status: Abnormal   Collection Time: 04/23/14 12:10 PM  Result Value Ref Range   Glucose-Capillary 169 (H) 70 - 99 mg/dL  Troponin I     Status: None   Collection Time: 04/23/14  2:59 PM  Result Value Ref Range   Troponin I <0.03 <0.031 ng/mL    Comment:        NO INDICATION OF MYOCARDIAL INJURY. Please note change in reference range.   Glucose, capillary     Status: Abnormal   Collection Time: 04/23/14  5:10 PM  Result Value Ref Range   Glucose-Capillary 212 (H) 70 - 99 mg/dL   Comment 1 Notify RN   Glucose, capillary     Status: Abnormal   Collection Time: 04/23/14  8:20 PM  Result Value Ref Range   Glucose-Capillary 194 (H) 70 - 99 mg/dL  Glucose, capillary     Status: Abnormal   Collection Time: 04/24/14 12:06 AM  Result Value Ref Range   Glucose-Capillary 156 (H) 70 - 99 mg/dL  Basic metabolic panel     Status: Abnormal   Collection Time: 04/24/14  3:24 AM  Result Value Ref Range   Sodium 139 135 - 145 mmol/L    Comment: Please note change in reference range.   Potassium 3.3 (L) 3.5 - 5.1 mmol/L    Comment: Please note change in reference range.   Chloride 111 96 - 112 mEq/L   CO2 20 19 - 32 mmol/L   Glucose, Bld 136 (H) 70 - 99 mg/dL   BUN 24 (H) 6 - 23 mg/dL   Creatinine, Ser 3.07 (H) 0.50 - 1.10 mg/dL   Calcium 8.3 (L) 8.4 - 10.5 mg/dL   GFR calc non Af Amer 13 (L) >90 mL/min   GFR calc Af Amer 15 (L) >90 mL/min    Comment: (NOTE) The eGFR has been calculated using the CKD EPI equation. This calculation has not been validated in all clinical situations. eGFR's  persistently <90 mL/min signify possible Chronic Kidney Disease.    Anion gap 8 5 - 15  CBC     Status: Abnormal   Collection Time: 04/24/14  3:24 AM  Result Value Ref Range   WBC 7.7 4.0 - 10.5 K/uL   RBC 3.05 (L) 3.87 - 5.11 MIL/uL   Hemoglobin 8.9 (  L) 12.0 - 15.0 g/dL   HCT 27.6 (L) 36.0 - 46.0 %   MCV 90.5 78.0 - 100.0 fL   MCH 29.2 26.0 - 34.0 pg   MCHC 32.2 30.0 - 36.0 g/dL   RDW 13.8 11.5 - 15.5 %   Platelets 155 150 - 400 K/uL  Glucose, capillary     Status: Abnormal   Collection Time: 04/24/14  4:42 AM  Result Value Ref Range   Glucose-Capillary 139 (H) 70 - 99 mg/dL  Glucose, capillary     Status: Abnormal   Collection Time: 04/24/14  7:58 AM  Result Value Ref Range   Glucose-Capillary 102 (H) 70 - 99 mg/dL  Glucose, capillary     Status: Abnormal   Collection Time: 04/24/14 11:35 AM  Result Value Ref Range   Glucose-Capillary 242 (H) 70 - 99 mg/dL     Signed: Jacques Earthly, MD 04/27/2014, 10:28 AM    Services Ordered on Discharge: None Equipment Ordered on Discharge: None

## 2014-04-28 ENCOUNTER — Encounter (HOSPITAL_COMMUNITY): Payer: Self-pay | Admitting: *Deleted

## 2014-04-28 ENCOUNTER — Emergency Department (HOSPITAL_COMMUNITY): Payer: Medicare Other

## 2014-04-28 ENCOUNTER — Observation Stay (HOSPITAL_COMMUNITY)
Admission: EM | Admit: 2014-04-28 | Discharge: 2014-05-01 | Disposition: A | Discharge status: Home / Self Care | Payer: Medicare Other | Attending: Internal Medicine | Admitting: Internal Medicine

## 2014-04-28 DIAGNOSIS — Z951 Presence of aortocoronary bypass graft: Secondary | ICD-10-CM | POA: Insufficient documentation

## 2014-04-28 DIAGNOSIS — I129 Hypertensive chronic kidney disease with stage 1 through stage 4 chronic kidney disease, or unspecified chronic kidney disease: Secondary | ICD-10-CM | POA: Diagnosis not present

## 2014-04-28 DIAGNOSIS — E785 Hyperlipidemia, unspecified: Secondary | ICD-10-CM | POA: Insufficient documentation

## 2014-04-28 DIAGNOSIS — R079 Chest pain, unspecified: Secondary | ICD-10-CM

## 2014-04-28 DIAGNOSIS — Z7982 Long term (current) use of aspirin: Secondary | ICD-10-CM | POA: Diagnosis not present

## 2014-04-28 DIAGNOSIS — E876 Hypokalemia: Secondary | ICD-10-CM | POA: Insufficient documentation

## 2014-04-28 DIAGNOSIS — I255 Ischemic cardiomyopathy: Secondary | ICD-10-CM

## 2014-04-28 DIAGNOSIS — I5042 Chronic combined systolic (congestive) and diastolic (congestive) heart failure: Secondary | ICD-10-CM | POA: Diagnosis not present

## 2014-04-28 DIAGNOSIS — I2582 Chronic total occlusion of coronary artery: Secondary | ICD-10-CM | POA: Insufficient documentation

## 2014-04-28 DIAGNOSIS — E872 Acidosis, unspecified: Secondary | ICD-10-CM | POA: Diagnosis present

## 2014-04-28 DIAGNOSIS — N390 Urinary tract infection, site not specified: Secondary | ICD-10-CM | POA: Insufficient documentation

## 2014-04-28 DIAGNOSIS — Z87440 Personal history of urinary (tract) infections: Secondary | ICD-10-CM | POA: Insufficient documentation

## 2014-04-28 DIAGNOSIS — W06XXXA Fall from bed, initial encounter: Secondary | ICD-10-CM | POA: Insufficient documentation

## 2014-04-28 DIAGNOSIS — D649 Anemia, unspecified: Secondary | ICD-10-CM | POA: Diagnosis not present

## 2014-04-28 DIAGNOSIS — R0600 Dyspnea, unspecified: Secondary | ICD-10-CM

## 2014-04-28 DIAGNOSIS — I2510 Atherosclerotic heart disease of native coronary artery without angina pectoris: Secondary | ICD-10-CM | POA: Diagnosis not present

## 2014-04-28 DIAGNOSIS — Z794 Long term (current) use of insulin: Secondary | ICD-10-CM | POA: Insufficient documentation

## 2014-04-28 DIAGNOSIS — D696 Thrombocytopenia, unspecified: Secondary | ICD-10-CM | POA: Insufficient documentation

## 2014-04-28 DIAGNOSIS — N185 Chronic kidney disease, stage 5: Secondary | ICD-10-CM | POA: Diagnosis present

## 2014-04-28 DIAGNOSIS — E1122 Type 2 diabetes mellitus with diabetic chronic kidney disease: Secondary | ICD-10-CM

## 2014-04-28 DIAGNOSIS — R0602 Shortness of breath: Secondary | ICD-10-CM

## 2014-04-28 DIAGNOSIS — N184 Chronic kidney disease, stage 4 (severe): Secondary | ICD-10-CM | POA: Insufficient documentation

## 2014-04-28 DIAGNOSIS — R0789 Other chest pain: Secondary | ICD-10-CM | POA: Diagnosis not present

## 2014-04-28 DIAGNOSIS — G8929 Other chronic pain: Secondary | ICD-10-CM | POA: Diagnosis not present

## 2014-04-28 DIAGNOSIS — M810 Age-related osteoporosis without current pathological fracture: Secondary | ICD-10-CM | POA: Insufficient documentation

## 2014-04-28 DIAGNOSIS — N321 Vesicointestinal fistula: Secondary | ICD-10-CM | POA: Diagnosis present

## 2014-04-28 DIAGNOSIS — K219 Gastro-esophageal reflux disease without esophagitis: Principal | ICD-10-CM | POA: Insufficient documentation

## 2014-04-28 DIAGNOSIS — E119 Type 2 diabetes mellitus without complications: Secondary | ICD-10-CM | POA: Diagnosis not present

## 2014-04-28 DIAGNOSIS — D631 Anemia in chronic kidney disease: Secondary | ICD-10-CM | POA: Diagnosis present

## 2014-04-28 DIAGNOSIS — I200 Unstable angina: Secondary | ICD-10-CM

## 2014-04-28 DIAGNOSIS — I10 Essential (primary) hypertension: Secondary | ICD-10-CM | POA: Diagnosis present

## 2014-04-28 HISTORY — DX: Diverticulosis of intestine, part unspecified, without perforation or abscess without bleeding: K57.90

## 2014-04-28 LAB — CBC
HCT: 27.4 % — ABNORMAL LOW (ref 36.0–46.0)
Hemoglobin: 9.1 g/dL — ABNORMAL LOW (ref 12.0–15.0)
MCH: 30.3 pg (ref 26.0–34.0)
MCHC: 33.2 g/dL (ref 30.0–36.0)
MCV: 91.3 fL (ref 78.0–100.0)
PLATELETS: 127 10*3/uL — AB (ref 150–400)
RBC: 3 MIL/uL — AB (ref 3.87–5.11)
RDW: 14.2 % (ref 11.5–15.5)
WBC: 6.8 10*3/uL (ref 4.0–10.5)

## 2014-04-28 LAB — BASIC METABOLIC PANEL WITH GFR
Anion gap: 8 (ref 5–15)
BUN: 33 mg/dL — ABNORMAL HIGH (ref 6–23)
CO2: 18 mmol/L — ABNORMAL LOW (ref 19–32)
Calcium: 8.4 mg/dL (ref 8.4–10.5)
Chloride: 109 meq/L (ref 96–112)
Creatinine, Ser: 3.27 mg/dL — ABNORMAL HIGH (ref 0.50–1.10)
GFR calc Af Amer: 14 mL/min — ABNORMAL LOW
GFR calc non Af Amer: 12 mL/min — ABNORMAL LOW
Glucose, Bld: 271 mg/dL — ABNORMAL HIGH (ref 70–99)
Potassium: 3.9 mmol/L (ref 3.5–5.1)
Sodium: 135 mmol/L (ref 135–145)

## 2014-04-28 LAB — TROPONIN I: Troponin I: <0.03 ng/mL

## 2014-04-28 LAB — BRAIN NATRIURETIC PEPTIDE: B NATRIURETIC PEPTIDE 5: 539.4 pg/mL — AB (ref 0.0–100.0)

## 2014-04-28 NOTE — ED Notes (Signed)
The pt is c/o sob with feet and leg swelling since she left the hospital 2-3 days ago.  She was treated for a uti and had fluid around her heart.  She has not been urinating as much the past few days and she has some mid-chest pain

## 2014-04-28 NOTE — ED Provider Notes (Signed)
CSN: [id number]     Arrival date & time 04/28/14  2202 History  This chart was scribed for Tilden FossaElizabeth Adiva Boettner, MD by Karle PlumberJennifer Tensley, ED Scribe. This patient was seen in room D35C/D35C and the patient's care was started at 11:16 PM.   Chief Complaint  Patient presents with  . Shortness of Breath   Patient is a 80 y.o. female presenting with shortness of breath. The history is provided by the patient. A language interpreter was used.  Shortness of Breath Associated symptoms: chest pain   Associated symptoms: no diaphoresis     HPI Comments:  Bianca [name] is a 80 y.o. female with PMH of CAD, HTN and HLD who presents to the Emergency Department complaining of SOB and CP, described as tightness, that began about 24 hour ago and rates it 5/10. She also reports severe lower back pain and rates it as 10/10. Pt states the pain in her lower back radiates to her neck. She has taken Tylenol for the pain with no significant relief of the pain. No modifying factors are reported. She states she is experiencing difficulty urinating stating it feels as if she has to "push" it out and she is urinating a lesser amount. She reports she feels as if her stomach is distended. Last BM was this morning but she states it was a smaller amount than normal. Pt was admitted to the hospital last week for UTI and several other complaints. She states her legs are more swollen now than last time she presented to the ED. Denies diaphoresis.  Past Medical History  Diagnosis Date  . Diabetes mellitus, type 2   . GERD (gastroesophageal reflux disease)   . Hepatitis B   . Hyperlipemia   . Hypertension   . Cervical dysplasia   . Atrophic vaginitis   . Spinal stenosis     s/p decompression  . CAD (coronary artery disease) April 2008    s/p CABG;    cath 8/12:  LM patent, pLAD occluded, OM1 30-40%, pOM2 80% and 60% after the anastomosis, pRCA occluded, S-DX occluded (old), S-OM 2 occluded (new), L-LAD patent, dLAD provided collats  to the PDA; Med Rx  rec.; consider PCI to OM2 if fails med Rx  . Hx of hysterectomy   . UTI (lower urinary tract infection)    Past Surgical History  Procedure Laterality Date  . Back surgery      decompression L3-S1  . Coronary artery bypass graft  April 2008  . Total abdominal hysterectomy      cervical dysplasia  . Esophagogastroduodenoscopy Left 04/22/2014    Procedure: ESOPHAGOGASTRODUODENOSCOPY (EGD);  Surgeon: Shirley FriarVincent C. Schooler, MD;  Location[facility];  Service: Endoscopy;  Laterality: Left;   No family history on file. History  Substance Use Topics  . Smoking status: Never Smoker   . Smokeless tobacco: Not on file  . Alcohol Use: No   OB History    No data available     Review of Systems  Constitutional: Negative for diaphoresis.  Respiratory: Positive for shortness of breath.   Cardiovascular: Positive for chest pain and leg swelling.  Gastrointestinal: Positive for abdominal distention.  Genitourinary: Positive for decreased urine volume and difficulty urinating.  Musculoskeletal: Positive for back pain.  Neurological: Positive for numbness.  All other systems reviewed and are negative.   Allergies  Cozaar and Lisinopril  Home Medications   Prior to Admission medications   Medication Sig Start Date End Date Taking? Authorizing Provider  acetaminophen (TYLENOL)  500 MG tablet Take 2 tablets (1,000 mg total) by mouth every 8 (eight) hours as needed for mild pain. 12/25/13 12/25/14  Rich Numberarly Rivet, MD  amLODipine (NORVASC) 5 MG tablet Take 1 tablet (5 mg total) by mouth daily. 06/11/13   Lorretta HarpXilin Niu, MD  aspirin EC 81 MG tablet TAKE 1 TABLET BY MOUTH EVERY DAY 01/03/13   Lorretta HarpXilin Niu, MD  B-D ULTRAFINE III SHORT PEN 31G X 8 MM MISC USE AS DIRECTED TO INJECT INSULIN 3 TIMES A DAY 03/12/14   Rich Numberarly Rivet, MD  BAYER CONTOUR NEXT TEST test strip TEST BLOOD SUGAR UP TO 5 TIMES A DAY 04/16/13   Lorretta HarpXilin Niu, MD  calcitRIOL (ROCALTROL) 0.25 MCG capsule Take 2 capsules (0.5 mcg  total) by mouth daily. Patient not taking: Reported on 04/20/2014 12/25/13   Rich Numberarly Rivet, MD  calcium citrate-vitamin D (CITRACAL+D) 315-200 MG-UNIT per tablet Take 2 tablets by mouth 2 (two) times daily. 12/10/13   Rich Numberarly Rivet, MD  carvedilol (COREG) 25 MG tablet TAKE 1 TABLET (25 MG TOTAL) BY MOUTH 2 (TWO) TIMES DAILY WITH A MEAL. 04/23/14   Carly Rivet, MD  CRESTOR 20 MG tablet TAKE 1 TABLET (20 MG TOTAL) BY MOUTH DAILY. 11/22/13   Levert FeinsteinJames M Granfortuna, MD  docusate sodium (COLACE) 100 MG capsule Take 1 capsule (100 mg total) by mouth daily as needed for mild constipation. 12/10/13 12/10/14  Rich Numberarly Rivet, MD  ferrous sulfate 325 (65 FE) MG tablet TAKE 1 TABLET BY MOUTH 3 TIMES DAILY WITH MEALS. Patient not taking: Reported on 04/20/2014 07/09/13   Lorretta HarpXilin Niu, MD  hydrALAZINE (APRESOLINE) 25 MG tablet Take 1 tablet (25 mg total) by mouth 3 (three) times daily. 06/11/13   Lorretta HarpXilin Niu, MD  isosorbide mononitrate (IMDUR) 30 MG 24 hr tablet TAKE 1 TABLET (30 MG TOTAL) BY MOUTH DAILY. 01/01/14   Carly Rivet, MD  LANTUS 100 UNIT/ML injection INJECT 0.2 MLS (20 UNITS TOTAL) INTO THE SKIN AT BEDTIME. 09/17/13   Lorretta HarpXilin Niu, MD  nitroGLYCERIN (NITROSTAT) 0.4 MG SL tablet Place 0.4 mg under the tongue every 5 (five) minutes as needed. For chest pain. Maximum of 3 doses    Historical Provider, MD  NOVOLOG FLEXPEN 100 UNIT/ML FlexPen INJECT 8 UNITS INTO THE SKIN THREE TIMES DAILY BEFORE MEALS. 01/08/14   Rich Numberarly Rivet, MD  pantoprazole (PROTONIX) 20 MG tablet Take 2 tablets (40 mg total) by mouth daily. Patient not taking: Reported on 04/20/2014 06/11/13 06/11/14  Lorretta HarpXilin Niu, MD  PROAIR HFA 108 (90 BASE) MCG/ACT inhaler INHALE 1 PUFF EVERY 6 HOURS AS NEEDED FOR WHEEZE 04/05/14   Rich Numberarly Rivet, MD  sennosides-docusate sodium (SENOKOT-S) 8.6-50 MG tablet Take 1 tablet by mouth daily. Patient not taking: Reported on 04/20/2014 04/11/12   Lorretta HarpXilin Niu, MD  traMADol (ULTRAM) 50 MG tablet Take 1 tablet (50 mg total) by mouth every 6 (six) hours as  needed for moderate pain. Patient not taking: Reported on 04/20/2014 12/10/13 12/10/14  Rich Numberarly Rivet, MD   Triage Vitals: BP 155/74 mmHg  Pulse 80  Temp(Src) 97.9 F (36.6 C) (Oral)  Resp 22  SpO2 100% Physical Exam  Constitutional: She is oriented to person, place, and time. She appears well-developed and well-nourished.  HENT:  Head: Normocephalic and atraumatic.  Cardiovascular: Normal rate and regular rhythm.   No murmur heard. Pulmonary/Chest: Effort normal and breath sounds normal. No respiratory distress.  Abdominal: Soft. There is no tenderness. There is no rebound and no guarding.  Ecchymosis of the lower abdominal wall  Musculoskeletal: She exhibits no tenderness.  2+ DP pulses bilaterally. 1-2+ pitting edema of right lower extremity. Trace pitting edema of left lower extremity. No discrete bony lumbar or CVA tenderness, but there is mild diffuse tenderness over the lower back. A healed surgical incision over the lower back.  Neurological: She is alert and oriented to person, place, and time.  Skin: Skin is warm and dry.  Psychiatric: She has a normal mood and affect. Her behavior is normal.  Nursing note and vitals reviewed.   ED Course  Procedures (including critical care time) DIAGNOSTIC STUDIES: Oxygen Saturation is 100% on RA, normal by my interpretation.   COORDINATION OF CARE: 11:28 PM- Will order lab work. Pt verbalizes understanding and agrees to plan.  Medications - No data to display  Labs Review Labs Reviewed  CBC - Abnormal; Notable for the following:    RBC 3.00 (*)    Hemoglobin 9.1 (*)    HCT 27.4 (*)    Platelets 127 (*)    All other components within normal limits  BASIC METABOLIC PANEL - Abnormal; Notable for the following:    CO2 18 (*)    Glucose, Bld 271 (*)    BUN 33 (*)    Creatinine, Ser 3.27 (*)    GFR calc non Af Amer 12 (*)    GFR calc Af Amer 14 (*)    All other components within normal limits  BRAIN NATRIURETIC PEPTIDE -  Abnormal; Notable for the following:    B Natriuretic Peptide 539.4 (*)    All other components within normal limits  URINE CULTURE  TROPONIN I  URINALYSIS, ROUTINE W REFLEX MICROSCOPIC  HEPARIN LEVEL (UNFRACTIONATED)    Imaging Review Dg Chest 2 View  04/28/2014   CLINICAL DATA:  Shortness of breath, centralized chest pain.  EXAM: CHEST  2 VIEW  COMPARISON:  04/23/2014  FINDINGS: Prior CABG. Heart is borderline in size. Hyperinflation of the lungs. Areas of scarring in the upper lobes and lung bases. No acute airspace opacities or effusions No acute bony abnormality.  IMPRESSION: COPD/chronic changes.  No active disease.   Electronically Signed   By: Charlett Nose M.D.   On: 04/28/2014 23:11     EKG Interpretation   Date/Time:  Sunday April 28 2014 22:11:44 EST Ventricular Rate:  81 PR Interval:  170 QRS Duration: 80 QT Interval:  394 QTC Calculation: 457 R Axis:   90 Text Interpretation:  Normal sinus rhythm Rightward axis q waves in II,  III, aVF, present on 04/20/14 Nonspecific ST abnormality Abnormal ECG  Confirmed by Lincoln Brigham [phone]) on 04/28/2014 11:34:57 PM      MDM   Final diagnoses:  Chest pain, unspecified chest pain type  Dyspnea    Patient here for evaluation of chest pain and shortness of breath. She was recently admitted to the hospital for abdominal pain and UTI with fistula. Since discharge she's had decreased urinary output and increased lower extremity swelling predominantly on the right lower extremity. Concern for potential DVT and PE, patient's her on heparin pending further workup. Patient will need vascular ultrasound of V/Q scan to fully evaluate. BMP with stable renal insufficiency compared to prior. Patient without wheezing or respiratory distress on exam, COPD exacerbation is felt to be unlikely at this time. Discussed with internal medicine work regarding admission for further workup of chest pain and shortness of breath. I personally performed  the services described in this documentation, which was scribed in my presence. The recorded information  has been reviewed and is accurate.    Tilden Fossa, MD 04/29/14 [phone]

## 2014-04-28 NOTE — ED Notes (Signed)
The pt does not speak english and her family members do not speak english very well

## 2014-04-28 NOTE — ED Notes (Signed)
Per Interpreter Phone Interview: Pt has CP which began last night, describes as "tightness".  CP is 5/10.  Involved SOB, but no N/V.  Pain is 10/10 in lower back, associated with change in urine to darker color and burning with urination.

## 2014-04-29 ENCOUNTER — Encounter (HOSPITAL_COMMUNITY): Payer: Self-pay | Admitting: Internal Medicine

## 2014-04-29 DIAGNOSIS — N390 Urinary tract infection, site not specified: Secondary | ICD-10-CM

## 2014-04-29 DIAGNOSIS — M4807 Spinal stenosis, lumbosacral region: Secondary | ICD-10-CM

## 2014-04-29 DIAGNOSIS — K219 Gastro-esophageal reflux disease without esophagitis: Secondary | ICD-10-CM | POA: Diagnosis not present

## 2014-04-29 DIAGNOSIS — R079 Chest pain, unspecified: Secondary | ICD-10-CM | POA: Insufficient documentation

## 2014-04-29 DIAGNOSIS — R0600 Dyspnea, unspecified: Secondary | ICD-10-CM | POA: Insufficient documentation

## 2014-04-29 DIAGNOSIS — M4806 Spinal stenosis, lumbar region: Secondary | ICD-10-CM

## 2014-04-29 DIAGNOSIS — Z8619 Personal history of other infectious and parasitic diseases: Secondary | ICD-10-CM

## 2014-04-29 DIAGNOSIS — I5042 Chronic combined systolic (congestive) and diastolic (congestive) heart failure: Secondary | ICD-10-CM

## 2014-04-29 DIAGNOSIS — R3989 Other symptoms and signs involving the genitourinary system: Secondary | ICD-10-CM

## 2014-04-29 DIAGNOSIS — N182 Chronic kidney disease, stage 2 (mild): Secondary | ICD-10-CM

## 2014-04-29 DIAGNOSIS — B9620 Unspecified Escherichia coli [E. coli] as the cause of diseases classified elsewhere: Secondary | ICD-10-CM

## 2014-04-29 DIAGNOSIS — K5790 Diverticulosis of intestine, part unspecified, without perforation or abscess without bleeding: Secondary | ICD-10-CM

## 2014-04-29 DIAGNOSIS — Z9861 Coronary angioplasty status: Secondary | ICD-10-CM

## 2014-04-29 DIAGNOSIS — Z794 Long term (current) use of insulin: Secondary | ICD-10-CM

## 2014-04-29 DIAGNOSIS — E1122 Type 2 diabetes mellitus with diabetic chronic kidney disease: Secondary | ICD-10-CM

## 2014-04-29 DIAGNOSIS — I130 Hypertensive heart and chronic kidney disease with heart failure and stage 1 through stage 4 chronic kidney disease, or unspecified chronic kidney disease: Secondary | ICD-10-CM

## 2014-04-29 DIAGNOSIS — I10 Essential (primary) hypertension: Secondary | ICD-10-CM

## 2014-04-29 DIAGNOSIS — D696 Thrombocytopenia, unspecified: Secondary | ICD-10-CM

## 2014-04-29 DIAGNOSIS — K838 Other specified diseases of biliary tract: Secondary | ICD-10-CM

## 2014-04-29 DIAGNOSIS — E785 Hyperlipidemia, unspecified: Secondary | ICD-10-CM

## 2014-04-29 DIAGNOSIS — E872 Acidosis, unspecified: Secondary | ICD-10-CM | POA: Diagnosis present

## 2014-04-29 DIAGNOSIS — R0602 Shortness of breath: Secondary | ICD-10-CM

## 2014-04-29 DIAGNOSIS — I2510 Atherosclerotic heart disease of native coronary artery without angina pectoris: Secondary | ICD-10-CM

## 2014-04-29 DIAGNOSIS — D649 Anemia, unspecified: Secondary | ICD-10-CM

## 2014-04-29 HISTORY — DX: Diverticulosis of intestine, part unspecified, without perforation or abscess without bleeding: K57.90

## 2014-04-29 LAB — URINALYSIS, ROUTINE W REFLEX MICROSCOPIC
BILIRUBIN URINE: NEGATIVE
Glucose, UA: 500 mg/dL — AB
Hgb urine dipstick: NEGATIVE
Ketones, ur: NEGATIVE mg/dL
Nitrite: NEGATIVE
Specific Gravity, Urine: 1.011 (ref 1.005–1.030)
UROBILINOGEN UA: 0.2 mg/dL (ref 0.0–1.0)
pH: 7 (ref 5.0–8.0)

## 2014-04-29 LAB — BASIC METABOLIC PANEL
ANION GAP: 7 (ref 5–15)
BUN: 30 mg/dL — ABNORMAL HIGH (ref 6–23)
CALCIUM: 8.2 mg/dL — AB (ref 8.4–10.5)
CHLORIDE: 111 meq/L (ref 96–112)
CO2: 19 mmol/L (ref 19–32)
Creatinine, Ser: 2.9 mg/dL — ABNORMAL HIGH (ref 0.50–1.10)
GFR calc Af Amer: 16 mL/min — ABNORMAL LOW (ref >90)
GFR calc non Af Amer: 14 mL/min — ABNORMAL LOW (ref >90)
Glucose, Bld: 196 mg/dL — ABNORMAL HIGH (ref 70–99)
Potassium: 3.4 mmol/L — ABNORMAL LOW (ref 3.5–5.1)
SODIUM: 137 mmol/L (ref 135–145)

## 2014-04-29 LAB — TROPONIN I: Troponin I: <0.03 ng/mL (ref <0.031)

## 2014-04-29 LAB — GLUCOSE, CAPILLARY
Glucose-Capillary: 243 mg/dL — ABNORMAL HIGH (ref 70–99)
Glucose-Capillary: 220 mg/dL — ABNORMAL HIGH (ref 70–99)
Glucose-Capillary: 153 mg/dL — ABNORMAL HIGH (ref 70–99)

## 2014-04-29 LAB — CBG MONITORING, ED: GLUCOSE-CAPILLARY: 175 mg/dL — AB (ref 70–99)

## 2014-04-29 LAB — URINE MICROSCOPIC-ADD ON

## 2014-04-29 LAB — HEPARIN LEVEL (UNFRACTIONATED)
HEPARIN UNFRACTIONATED: 0.53 [IU]/mL (ref 0.30–0.70)
Heparin Unfractionated: 0.71 IU/mL — ABNORMAL HIGH (ref 0.30–0.70)

## 2014-04-29 MED ORDER — PANTOPRAZOLE SODIUM 40 MG PO TBEC: 40.0000 mg | DELAYED_RELEASE_TABLET | Freq: Every day | ORAL | Status: DC

## 2014-04-29 MED ORDER — ISOSORBIDE MONONITRATE ER 30 MG PO TB24
30.0000 mg | ORAL_TABLET | Freq: Every day | ORAL | Status: DC
  Administered 2014-04-29: 30 mg via ORAL
  Filled 2014-04-29: qty 1

## 2014-04-29 MED ORDER — CALCITRIOL 0.5 MCG PO CAPS
0.5000 ug | ORAL_CAPSULE | Freq: Every day | ORAL | Status: DC
  Administered 2014-04-29 – 2014-05-01 (×3): 0.5 ug via ORAL
  Filled 2014-04-29 (×3): qty 1

## 2014-04-29 MED ORDER — POTASSIUM CHLORIDE CRYS ER 20 MEQ PO TBCR: 40.0000 meq | EXTENDED_RELEASE_TABLET | Freq: Once | ORAL | Status: DC

## 2014-04-29 MED ORDER — AMOXICILLIN-POT CLAVULANATE 500-125 MG PO TABS
1.0000 | ORAL_TABLET | Freq: Two times a day (BID) | ORAL | Status: DC
  Administered 2014-04-29 – 2014-05-01 (×5): 500 mg via ORAL
  Filled 2014-04-29 (×6): qty 1

## 2014-04-29 MED ORDER — IPRATROPIUM-ALBUTEROL 0.5-2.5 (3) MG/3ML IN SOLN
3.0000 mL | Freq: Four times a day (QID) | RESPIRATORY_TRACT | Status: DC
  Administered 2014-04-29: 3 mL via RESPIRATORY_TRACT
  Filled 2014-04-29: qty 3

## 2014-04-29 MED ORDER — IPRATROPIUM-ALBUTEROL 0.5-2.5 (3) MG/3ML IN SOLN: 3.0000 mL | Freq: Four times a day (QID) | RESPIRATORY_TRACT | Status: DC | PRN

## 2014-04-29 MED ORDER — SODIUM CHLORIDE 0.9 % IV SOLN: Freq: Once | INTRAVENOUS | Status: DC

## 2014-04-29 MED ORDER — PANTOPRAZOLE SODIUM 40 MG PO TBEC
40.0000 mg | DELAYED_RELEASE_TABLET | Freq: Two times a day (BID) | ORAL | Status: DC
  Administered 2014-04-29 – 2014-05-01 (×4): 40 mg via ORAL
  Filled 2014-04-29 (×4): qty 1

## 2014-04-29 MED ORDER — ISOSORBIDE MONONITRATE ER 60 MG PO TB24
60.0000 mg | ORAL_TABLET | Freq: Every day | ORAL | Status: DC
  Administered 2014-04-30 – 2014-05-01 (×2): 60 mg via ORAL
  Filled 2014-04-29 (×2): qty 1

## 2014-04-29 MED ORDER — SODIUM CHLORIDE 0.9 % IJ SOLN
3.0000 mL | Freq: Two times a day (BID) | INTRAMUSCULAR | Status: DC
  Administered 2014-04-30 – 2014-05-01 (×3): 3 mL via INTRAVENOUS

## 2014-04-29 MED ORDER — INSULIN ASPART 100 UNIT/ML ~~LOC~~ SOLN
0.0000 [IU] | Freq: Three times a day (TID) | SUBCUTANEOUS | Status: DC
  Administered 2014-04-29 – 2014-04-30 (×2): 3 [IU] via SUBCUTANEOUS
  Administered 2014-05-01: 5 [IU] via SUBCUTANEOUS
  Filled 2014-04-29: qty 1

## 2014-04-29 MED ORDER — CARVEDILOL 25 MG PO TABS
25.0000 mg | ORAL_TABLET | Freq: Two times a day (BID) | ORAL | Status: DC
  Administered 2014-04-29 – 2014-05-01 (×5): 25 mg via ORAL
  Filled 2014-04-29 (×8): qty 1

## 2014-04-29 MED ORDER — HEPARIN (PORCINE) IN NACL 100-0.45 UNIT/ML-% IJ SOLN
700.0000 [IU]/h | INTRAMUSCULAR | Status: DC
  Filled 2014-04-29: qty 250

## 2014-04-29 MED ORDER — CALCIUM CITRATE-VITAMIN D 315-200 MG-UNIT PO TABS: 2.0000 | ORAL_TABLET | Freq: Two times a day (BID) | ORAL | Status: DC

## 2014-04-29 MED ORDER — INSULIN ASPART 100 UNIT/ML ~~LOC~~ SOLN
0.0000 [IU] | Freq: Every day | SUBCUTANEOUS | Status: DC
  Administered 2014-04-30: 4 [IU] via SUBCUTANEOUS

## 2014-04-29 MED ORDER — INSULIN ASPART 100 UNIT/ML ~~LOC~~ SOLN
4.0000 [IU] | Freq: Three times a day (TID) | SUBCUTANEOUS | Status: DC
  Administered 2014-04-30 (×2): 4 [IU] via SUBCUTANEOUS

## 2014-04-29 MED ORDER — TRAMADOL HCL 50 MG PO TABS
50.0000 mg | ORAL_TABLET | Freq: Four times a day (QID) | ORAL | Status: DC | PRN
Start: 2014-04-29 — End: 2014-05-01

## 2014-04-29 MED ORDER — ROSUVASTATIN CALCIUM 20 MG PO TABS
20.0000 mg | ORAL_TABLET | Freq: Every day | ORAL | Status: DC
  Administered 2014-04-29 – 2014-05-01 (×3): 20 mg via ORAL
  Filled 2014-04-29 (×3): qty 1

## 2014-04-29 MED ORDER — HEPARIN BOLUS VIA INFUSION
2000.0000 [IU] | Freq: Once | INTRAVENOUS | Status: AC
  Administered 2014-04-29: 2000 [IU] via INTRAVENOUS
  Filled 2014-04-29: qty 2000

## 2014-04-29 MED ORDER — HYDRALAZINE HCL 25 MG PO TABS
25.0000 mg | ORAL_TABLET | Freq: Three times a day (TID) | ORAL | Status: DC
  Administered 2014-04-29 – 2014-05-01 (×6): 25 mg via ORAL
  Filled 2014-04-29 (×9): qty 1

## 2014-04-29 MED ORDER — GI COCKTAIL ~~LOC~~: 30.0000 mL | Freq: Once | ORAL | Status: DC

## 2014-04-29 MED ORDER — CALCIUM CARBONATE-VITAMIN D 500-200 MG-UNIT PO TABS
1.0000 | ORAL_TABLET | Freq: Two times a day (BID) | ORAL | Status: DC
  Administered 2014-04-30 – 2014-05-01 (×4): 1 via ORAL
  Filled 2014-04-29 (×6): qty 1

## 2014-04-29 MED ORDER — INSULIN GLARGINE 100 UNIT/ML ~~LOC~~ SOLN
20.0000 [IU] | Freq: Every day | SUBCUTANEOUS | Status: DC
  Administered 2014-04-29 – 2014-04-30 (×2): 20 [IU] via SUBCUTANEOUS
  Filled 2014-04-29 (×3): qty 0.2

## 2014-04-29 MED ORDER — ASPIRIN EC 81 MG PO TBEC
81.0000 mg | DELAYED_RELEASE_TABLET | Freq: Every day | ORAL | Status: DC
  Administered 2014-04-29 – 2014-05-01 (×3): 81 mg via ORAL
  Filled 2014-04-29 (×3): qty 1

## 2014-04-29 MED ORDER — HEPARIN SODIUM (PORCINE) 5000 UNIT/ML IJ SOLN
5000.0000 [IU] | Freq: Three times a day (TID) | INTRAMUSCULAR | Status: DC
  Administered 2014-04-29 – 2014-05-01 (×6): 5000 [IU] via SUBCUTANEOUS
  Filled 2014-04-29 (×8): qty 1

## 2014-04-29 MED ORDER — AMLODIPINE BESYLATE 5 MG PO TABS
5.0000 mg | ORAL_TABLET | Freq: Every day | ORAL | Status: DC
  Administered 2014-04-30 – 2014-05-01 (×2): 5 mg via ORAL
  Filled 2014-04-29 (×2): qty 1

## 2014-04-29 NOTE — ED Notes (Signed)
Breakfast arrived, pt repositioned to eat breakfast.  Pt. 's CBG done 175, reported to MaxwellBrooke, Charity fundraiserN  . Daughter at the bedsidel

## 2014-04-29 NOTE — ED Notes (Signed)
cbg 175 

## 2014-04-29 NOTE — Consult Note (Signed)
Cardiologist:   Hochrein Reason for Consult: Chest Pain Referring Physician: [name] is an 80 y.o. female.  HPI:    Patient is an 80 year old female with history of coronary artery disease, coronary artery bypass Grafting in April 2008, hypertension, hyperlipidemia, diabetes mellitus type 2, GERD.  Her last echocardiogram was June 2013 showed an ejection fraction of 55-60% with normal wall motion. Grade 2 diastolic dysfunction. Mild MR.  Last cardiac catheterization was August 2012: LAD 100% occluded proximally. OM1 was small with 30-40% diffuse disease. Second obtuse marginal branch was larger and had 80% long tubular area of disease proximally. 60% focal area of disease distal to the old anastomotic site. Patent SVG to the distal RCA the Y graft area was 100% occluded and noted previously. The PDA filled from left-to-right collaterals. SVG to the diagonal branch was known to be occluded continued to be so. SVG to the second obtuse marginal branch was 100% occluded(this was new). LIMA to the LAD was widely patent. The OM 2 branch was treated medically.     Patient presents today with chest pain, SOB, LEE, abd pain, diaphoresis, nausea/vomiting which has been ongoing for the last two months.  The pain starts in her abdomen and rises up to her chest.  She feels very tired after having an episode.  She reports acid reflux.  The pain may be worse after eating and occurs at rest.  The sweaty spells come and go.  The patient currently denies fever, orthopnea, PND, dizziness, PND, cough, congestion, hematochezia, melena.   Past Medical History  Diagnosis Date  . Diabetes mellitus, type 2   . GERD (gastroesophageal reflux disease)   . Hepatitis B   . Hyperlipemia   . Hypertension   . Cervical dysplasia   . Atrophic vaginitis   . Spinal stenosis     s/p decompression  . CAD (coronary artery disease) April 2008    s/p CABG;    cath 8/12:  LM patent, pLAD occluded, OM1 30-40%, pOM2 80%  and 60% after the anastomosis, pRCA occluded, S-DX occluded (old), S-OM 2 occluded (new), L-LAD patent, dLAD provided collats to the PDA; Med Rx  rec.; consider PCI to OM2 if fails med Rx  . Hx of hysterectomy   . UTI (lower urinary tract infection)   . Diverticulosis of intestine 04/29/2014    Of ascending colon noted on CT abd/ pelvis 04/20/14    Past Surgical History  Procedure Laterality Date  . Back surgery      decompression L3-S1  . Coronary artery bypass graft  April 2008  . Total abdominal hysterectomy      cervical dysplasia  . Esophagogastroduodenoscopy Left 04/22/2014    Procedure: ESOPHAGOGASTRODUODENOSCOPY (EGD);  Surgeon: Lear Ng, MD;  Location: [facility] ENDOSCOPY;  Service: Endoscopy;  Laterality: Left;    History reviewed. No pertinent family history.  Social History:  reports that she has never smoked. She has never used smokeless tobacco. She reports that she does not drink alcohol or use illicit drugs.  Allergies:  Allergies  Allergen Reactions  . Cozaar Other (See Comments)    Sig increase in her creatinine that resolved once it was D/C'd  Per Cr visible in epic, it seems her baseline Cr = 1.7-1.9, and the slight improvement to Cr was an outlier when compared to recent Cr.  We will do a trial of Lisinopril (and monitor for 30% rise in Cr), as she would greatly benefit from renal protection.  Marland Kitchen  Lisinopril     Cough     Medications:  Scheduled Meds: . sodium chloride   Intravenous Once  . amLODipine  5 mg Oral Daily  . amoxicillin-clavulanate  1 tablet Oral BID  . aspirin EC  81 mg Oral Daily  . calcitRIOL  0.5 mcg Oral Daily  . calcium-vitamin D  1 tablet Oral BID WC  . carvedilol  25 mg Oral BID WC  . gi cocktail  30 mL Oral Once  . heparin subcutaneous  5,000 Units Subcutaneous 3 times per day  . hydrALAZINE  25 mg Oral TID  . insulin aspart  0-15 Units Subcutaneous TID WC  . insulin aspart  0-5 Units Subcutaneous QHS  . insulin aspart  4  Units Subcutaneous TID WC  . insulin glargine  20 Units Subcutaneous QHS  . ipratropium-albuterol  3 mL Nebulization Q6H  . isosorbide mononitrate  30 mg Oral Daily  . pantoprazole  40 mg Oral Daily  . potassium chloride  40 mEq Oral Once  . rosuvastatin  20 mg Oral Daily  . sodium chloride  3 mL Intravenous Q12H   Continuous Infusions:  PRN Meds:.traMADol   Results for orders placed or performed during the hospital encounter of 04/28/14 (from the past 48 hour(s))  CBC     Status: Abnormal   Collection Time: 04/28/14 11:05 PM  Result Value Ref Range   WBC 6.8 4.0 - 10.5 K/uL   RBC 3.00 (L) 3.87 - 5.11 MIL/uL   Hemoglobin 9.1 (L) 12.0 - 15.0 g/dL   HCT 27.4 (L) 36.0 - 46.0 %   MCV 91.3 78.0 - 100.0 fL   MCH 30.3 26.0 - 34.0 pg   MCHC 33.2 30.0 - 36.0 g/dL   RDW 14.2 11.5 - 15.5 %   Platelets 127 (L) 150 - 400 K/uL  Basic metabolic panel     Status: Abnormal   Collection Time: 04/28/14 11:05 PM  Result Value Ref Range   Sodium 135 135 - 145 mmol/L    Comment: Please note change in reference range.   Potassium 3.9 3.5 - 5.1 mmol/L    Comment: Please note change in reference range.   Chloride 109 96 - 112 mEq/L   CO2 18 (L) 19 - 32 mmol/L   Glucose, Bld 271 (H) 70 - 99 mg/dL   BUN 33 (H) 6 - 23 mg/dL   Creatinine, Ser 3.27 (H) 0.50 - 1.10 mg/dL   Calcium 8.4 8.4 - 10.5 mg/dL   GFR calc non Af Amer 12 (L) >90 mL/min   GFR calc Af Amer 14 (L) >90 mL/min    Comment: (NOTE) The eGFR has been calculated using the CKD EPI equation. This calculation has not been validated in all clinical situations. eGFR's persistently <90 mL/min signify possible Chronic Kidney Disease.    Anion gap 8 5 - 15  BNP (order ONLY if patient complains of dyspnea/SOB AND you have documented it for THIS visit)     Status: Abnormal   Collection Time: 04/28/14 11:05 PM  Result Value Ref Range   B Natriuretic Peptide 539.4 (H) 0.0 - 100.0 pg/mL    Comment: Please note change in reference range.    Troponin I     Status: None   Collection Time: 04/28/14 11:05 PM  Result Value Ref Range   Troponin I <0.03 <0.031 ng/mL    Comment:        NO INDICATION OF MYOCARDIAL INJURY. Please note change in reference range.  Urinalysis, Routine w reflex microscopic     Status: Abnormal   Collection Time: 04/29/14  1:53 AM  Result Value Ref Range   Color, Urine YELLOW YELLOW   APPearance CLEAR CLEAR   Specific Gravity, Urine 1.011 1.005 - 1.030   pH 7.0 5.0 - 8.0   Glucose, UA 500 (A) NEGATIVE mg/dL   Hgb urine dipstick NEGATIVE NEGATIVE   Bilirubin Urine NEGATIVE NEGATIVE   Ketones, ur NEGATIVE NEGATIVE mg/dL   Protein, ur >174 (A) NEGATIVE mg/dL   Urobilinogen, UA 0.2 0.0 - 1.0 mg/dL   Nitrite NEGATIVE NEGATIVE   Leukocytes, UA TRACE (A) NEGATIVE  Urine microscopic-add on     Status: Abnormal   Collection Time: 04/29/14  1:53 AM  Result Value Ref Range   Squamous Epithelial / LPF FEW (A) RARE   WBC, UA 0-2 <3 WBC/hpf   Bacteria, UA FEW (A) RARE  Basic metabolic panel     Status: Abnormal   Collection Time: 04/29/14  6:46 AM  Result Value Ref Range   Sodium 137 135 - 145 mmol/L    Comment: Please note change in reference range.   Potassium 3.4 (L) 3.5 - 5.1 mmol/L    Comment: Please note change in reference range.   Chloride 111 96 - 112 mEq/L   CO2 19 19 - 32 mmol/L   Glucose, Bld 196 (H) 70 - 99 mg/dL   BUN 30 (H) 6 - 23 mg/dL   Creatinine, Ser 0.81 (H) 0.50 - 1.10 mg/dL   Calcium 8.2 (L) 8.4 - 10.5 mg/dL   GFR calc non Af Amer 14 (L) >90 mL/min   GFR calc Af Amer 16 (L) >90 mL/min    Comment: (NOTE) The eGFR has been calculated using the CKD EPI equation. This calculation has not been validated in all clinical situations. eGFR's persistently <90 mL/min signify possible Chronic Kidney Disease.    Anion gap 7 5 - 15  Troponin I     Status: None   Collection Time: 04/29/14  6:46 AM  Result Value Ref Range   Troponin I <0.03 <0.031 ng/mL    Comment:        NO  INDICATION OF MYOCARDIAL INJURY. Please note change in reference range.   CBG monitoring, ED     Status: Abnormal   Collection Time: 04/29/14  8:37 AM  Result Value Ref Range   Glucose-Capillary 175 (H) 70 - 99 mg/dL   Comment 1 Notify RN   Heparin level (unfractionated)     Status: None   Collection Time: 04/29/14  8:55 AM  Result Value Ref Range   Heparin Unfractionated 0.53 0.30 - 0.70 IU/mL    Comment:        IF HEPARIN RESULTS ARE BELOW EXPECTED VALUES, AND PATIENT DOSAGE HAS BEEN CONFIRMED, SUGGEST FOLLOW UP TESTING OF ANTITHROMBIN III LEVELS.   Glucose, capillary     Status: Abnormal   Collection Time: 04/29/14 12:07 PM  Result Value Ref Range   Glucose-Capillary 243 (H) 70 - 99 mg/dL   Comment 1 Notify RN   Troponin I     Status: None   Collection Time: 04/29/14 12:59 PM  Result Value Ref Range   Troponin I <0.03 <0.031 ng/mL    Comment:        NO INDICATION OF MYOCARDIAL INJURY. Please note change in reference range.   Heparin level (unfractionated)     Status: Abnormal   Collection Time: 04/29/14  2:31 PM  Result Value Ref Range  Heparin Unfractionated 0.71 (H) 0.30 - 0.70 IU/mL    Comment:        IF HEPARIN RESULTS ARE BELOW EXPECTED VALUES, AND PATIENT DOSAGE HAS BEEN CONFIRMED, SUGGEST FOLLOW UP TESTING OF ANTITHROMBIN III LEVELS.     Dg Chest 2 View  04/28/2014   CLINICAL DATA:  Shortness of breath, centralized chest pain.  EXAM: CHEST  2 VIEW  COMPARISON:  04/23/2014  FINDINGS: Prior CABG. Heart is borderline in size. Hyperinflation of the lungs. Areas of scarring in the upper lobes and lung bases. No acute airspace opacities or effusions No acute bony abnormality.  IMPRESSION: COPD/chronic changes.  No active disease.   Electronically Signed   By: Rolm Baptise M.D.   On: 04/28/2014 23:11    Review of Systems  Constitutional: Positive for diaphoresis. Negative for fever.  HENT: Negative for congestion.   Respiratory: Positive for shortness of  breath. Negative for cough.   Cardiovascular: Positive for chest pain and leg swelling. Negative for orthopnea and PND.  Gastrointestinal: Positive for nausea, vomiting and abdominal pain.  Musculoskeletal: Positive for back pain.  Neurological: Positive for weakness. Negative for dizziness.  All other systems reviewed and are negative.  Blood pressure 153/70, pulse 74, temperature 98.5 F (36.9 C), temperature source Oral, resp. rate 16, weight 116 lb 10 oz (52.9 kg), SpO2 100 %. Physical Exam  Nursing note and vitals reviewed. Constitutional: She is oriented to person, place, and time. She appears well-developed and well-nourished.  HENT:  Head: Normocephalic and atraumatic.  Eyes: EOM are normal. Pupils are equal, round, and reactive to light. No scleral icterus.  Neck: Normal range of motion. Neck supple. No JVD present.  Cardiovascular: Normal rate, regular rhythm and S1 normal.  Exam reveals gallop and S3.   No murmur heard. Pulses:      Radial pulses are 2+ on the right side, and 2+ on the left side.       Dorsalis pedis pulses are 2+ on the right side, and 2+ on the left side.  No Carotid bruit  Respiratory: Effort normal and breath sounds normal. She has no wheezes. She has no rales.  GI: Soft. Bowel sounds are normal. She exhibits no distension. There is tenderness (Left to mid region).  Musculoskeletal: She exhibits edema (Trace LEE).  Neurological: She is alert and oriented to person, place, and time. She exhibits normal muscle tone.  Skin: Skin is warm and dry.  Psychiatric: She has a normal mood and affect.    Assessment/Plan: Principal Problem:   SOB (shortness of breath) Active Problems:   Essential hypertension   Coronary atherosclerosis   Chronic renal failure, stage 4 (severe)   Type II diabetes mellitus   Back pain   Normocytic anemia   Fistula, intestinovesical   Diastolic dysfunction   Metabolic acidosis, normal anion gap (NAG)   Pain in the  chest  Plan:  The patient's CP sounds GI related perhaps GERD.  She has ruled out for MI. Nonspecific changes on her EKG.  Chest x-ray shows nothing acute.  She appears euvolemic. BNP is only 539.  Given her SCr is 2.9 we will treat medically by  increasing Imdur to $Remove'60mg'TmMnxGZ$ . Continue Coreg.  Check Echo.  Bianca Fuller, PA-C 04/29/2014, 4:16 PM   I have personally seen, interviewed and examined the patient.  Patient interview translated via her daughter.  She has been having problems with chest pain that is midsternal and radiates into her throat.  It is improved sometimes with  alka seltzer but not always.  She also has been having problems with SOB and episodes of diaphoresis that last up to 30 minutes and then she is tired.  Some of her symptoms sound more related to GI etiology but of concern is the SOB and episodes of diaphoresis.  She has known CAD with graft failure and has been on medical management since her last cath in 2012.  She already takes a PPI daily.  At this time I would favor aggressive medical therapy given her worsening renal function with creatinine now >3.  Will increase Imdur to 7m daily.  Will check 2D echo to reassess LVF.  Diurese as renal function tolerates.  Signed: TFransico Him MD [facility] 04/29/2014

## 2014-04-29 NOTE — Progress Notes (Signed)
ANTICOAGULATION CONSULT NOTE - Initial Consult  Pharmacy Consult for Heparin  Indication: r/o DVT/PE  Allergies  Allergen Reactions  . Cozaar Other (See Comments)    Sig increase in her creatinine that resolved once it was D/C'd  Per Cr visible in epic, it seems her baseline Cr = 1.7-1.9, and the slight improvement to Cr was an outlier when compared to recent Cr.  We will do a trial of Lisinopril (and monitor for 30% rise in Cr), as she would greatly benefit from renal protection.  . Lisinopril     Cough     Patient Measurements: 52.6kg  Vital Signs: Temp: 97.9 F (36.6 C) (12/27 2213) Temp Source: Oral (12/27 2213) BP: 159/73 mmHg (12/27 2315) Pulse Rate: 80 (12/27 2315)  Labs:  Recent Labs  04/28/14 2305  HGB 9.1*  HCT 27.4*  PLT 127*  CREATININE 3.27*  TROPONINI <0.03    Estimated Creatinine Clearance: 9.7 mL/min (by C-G formula based on Cr of 3.27).   Medical History: Past Medical History  Diagnosis Date  . Diabetes mellitus, type 2   . GERD (gastroesophageal reflux disease)   . Hepatitis B   . Hyperlipemia   . Hypertension   . Cervical dysplasia   . Atrophic vaginitis   . Spinal stenosis     s/p decompression  . CAD (coronary artery disease) April 2008    s/p CABG;    cath 8/12:  LM patent, pLAD occluded, OM1 30-40%, pOM2 80% and 60% after the anastomosis, pRCA occluded, S-DX occluded (old), S-OM 2 occluded (new), L-LAD patent, dLAD provided collats to the PDA; Med Rx  rec.; consider PCI to OM2 if fails med Rx  . Hx of hysterectomy   . UTI (lower urinary tract infection)     Assessment: 80 y/o F with recent DC from hospital. Back with feet/leg swelling, CP, shortness of breath. Concern for DVT/PE, imaging available later today. Hgb 9.1. CKD with CrCl <20. Other labs as above.   Goal of Therapy:  Heparin level 0.3-0.7 units/ml Monitor platelets by anticoagulation protocol: Yes   Plan:  -Heparin 2000 units BOLUS -Start heparin drip at 700  units/hr -0900 HL -Daily CBC/HL -F/U VTE work-up  Abran DukeLedford, Jartavious Mckimmy 04/29/2014,12:08 AM

## 2014-04-29 NOTE — H&P (Signed)
Date: 04/29/2014               Patient Name:  Bianca [name] T Crumrine MRN: [id number]  DOB: 1930-08-13 Age / Sex: 80 y.o., female   PCP: Rich Numberarly Rivet, MD         Medical Service: Internal Medicine Teaching Service         Attending Physician: Dr. Tilden FossaElizabeth Rees, MD    First Contact: Dr. Senaida Oresichardson Pager: [phone]  Second Contact: Dr. Delane GingerGill Pager: [phone]       After Hours (After 5p/  First Contact Pager: [phone]  weekends / holidays): Second Contact Pager: [phone]   Chief Complaint: sob, chest tightness, severe low back pain  History of Present Illness:   80 yo female with CAD s/p cabg, HTN, HLD, recurrent UTI's recently admitted for UTI, here with SOB, chest tightness and worsening of LBP. She was doing well when she was discharged on 04/24/14. Today around 6 pm she started feeling SOB and chest tightness, happened all the sudden. Had some wheezing per daughter, she also had some wheezing last admission. She has some cough and pain caused by coughing. Otherwise she just feels tight in her chest but no pain with deep inspiration. She felt her chest tightness/sob improved somewhat with her inhaler at home.   She also having some burning with urination and feels like bubbles are in the urine. Has some back pain, more than baseline, on her lower back. Has baseline numbness on her foot but no changes recently. Had increased swelling on her legs. No calf tenderness. Not sure if she had blood clot in the past. Denies fever/chills, n/v.   ED provider concerned for PE, started heparin drip. No d-dimer or VQ scan was ordered.   CXR suggestive of copd but never diagnosed formally, no previous PFT's.   Meds: Current Facility-Administered Medications  Medication Dose Route Frequency Provider Last Rate Last Dose  . heparin ADULT infusion 100 units/mL (25000 units/250 mL)  700 Units/hr Intravenous Continuous Abran DukeJames Ledford, RPH 7 mL/hr at 04/29/14 0040 700 Units/hr at 04/29/14 0040   Current Outpatient  Prescriptions  Medication Sig Dispense Refill  . acetaminophen (TYLENOL) 500 MG tablet Take 2 tablets (1,000 mg total) by mouth every 8 (eight) hours as needed for mild pain. 100 tablet 2  . amLODipine (NORVASC) 5 MG tablet Take 1 tablet (5 mg total) by mouth daily. 30 tablet 11  . aspirin EC 81 MG tablet TAKE 1 TABLET BY MOUTH EVERY DAY 150 tablet 1  . B-D ULTRAFINE III SHORT PEN 31G X 8 MM MISC USE AS DIRECTED TO INJECT INSULIN 3 TIMES A DAY 100 each 6  . BAYER CONTOUR NEXT TEST test strip TEST BLOOD SUGAR UP TO 5 TIMES A DAY 100 each 11  . carvedilol (COREG) 25 MG tablet TAKE 1 TABLET (25 MG TOTAL) BY MOUTH 2 (TWO) TIMES DAILY WITH A MEAL. 120 tablet 3  . CRESTOR 20 MG tablet TAKE 1 TABLET (20 MG TOTAL) BY MOUTH DAILY. 90 tablet 3  . docusate sodium (COLACE) 100 MG capsule Take 1 capsule (100 mg total) by mouth daily as needed for mild constipation. 30 capsule 1  . hydrALAZINE (APRESOLINE) 25 MG tablet Take 1 tablet (25 mg total) by mouth 3 (three) times daily. 270 tablet 5  . isosorbide mononitrate (IMDUR) 30 MG 24 hr tablet TAKE 1 TABLET (30 MG TOTAL) BY MOUTH DAILY. 90 tablet 3  . LANTUS 100 UNIT/ML injection INJECT 0.2 MLS (20 UNITS TOTAL) INTO THE  SKIN AT BEDTIME. 10 mL 5  . nitroGLYCERIN (NITROSTAT) 0.4 MG SL tablet Place 0.4 mg under the tongue every 5 (five) minutes as needed. For chest pain. Maximum of 3 doses    . NOVOLOG FLEXPEN 100 UNIT/ML FlexPen INJECT 8 UNITS INTO THE SKIN THREE TIMES DAILY BEFORE MEALS. 100 mL 9  . PROAIR HFA 108 (90 BASE) MCG/ACT inhaler INHALE 1 PUFF EVERY 6 HOURS AS NEEDED FOR WHEEZE 8.5 each 2  . calcitRIOL (ROCALTROL) 0.25 MCG capsule Take 2 capsules (0.5 mcg total) by mouth daily. (Patient not taking: Reported on 04/20/2014) 60 capsule 5  . calcium citrate-vitamin D (CITRACAL+D) 315-200 MG-UNIT per tablet Take 2 tablets by mouth 2 (two) times daily. (Patient not taking: Reported on 04/29/2014) 100 tablet 3  . ferrous sulfate 325 (65 FE) MG tablet TAKE 1  TABLET BY MOUTH 3 TIMES DAILY WITH MEALS. (Patient not taking: Reported on 04/20/2014) 90 tablet 5  . pantoprazole (PROTONIX) 20 MG tablet Take 2 tablets (40 mg total) by mouth daily. (Patient not taking: Reported on 04/20/2014) 30 tablet 1  . sennosides-docusate sodium (SENOKOT-S) 8.6-50 MG tablet Take 1 tablet by mouth daily. (Patient not taking: Reported on 04/20/2014) 90 tablet 3  . traMADol (ULTRAM) 50 MG tablet Take 1 tablet (50 mg total) by mouth every 6 (six) hours as needed for moderate pain. (Patient not taking: Reported on 04/20/2014) 20 tablet 0    Allergies: Allergies as of 04/28/2014 - Review Complete 04/28/2014  Allergen Reaction Noted  . Cozaar Other (See Comments) 02/12/2011  . Lisinopril  05/24/2012   Past Medical History  Diagnosis Date  . Diabetes mellitus, type 2   . GERD (gastroesophageal reflux disease)   . Hepatitis B   . Hyperlipemia   . Hypertension   . Cervical dysplasia   . Atrophic vaginitis   . Spinal stenosis     s/p decompression  . CAD (coronary artery disease) April 2008    s/p CABG;    cath 8/12:  LM patent, pLAD occluded, OM1 30-40%, pOM2 80% and 60% after the anastomosis, pRCA occluded, S-DX occluded (old), S-OM 2 occluded (new), L-LAD patent, dLAD provided collats to the PDA; Med Rx  rec.; consider PCI to OM2 if fails med Rx  . Hx of hysterectomy   . UTI (lower urinary tract infection)   . Diverticulosis of intestine 04/29/2014    Of ascending colon noted on CT abd/ pelvis 04/20/14   Past Surgical History  Procedure Laterality Date  . Back surgery      decompression L3-S1  . Coronary artery bypass graft  April 2008  . Total abdominal hysterectomy      cervical dysplasia  . Esophagogastroduodenoscopy Left 04/22/2014    Procedure: ESOPHAGOGASTRODUODENOSCOPY (EGD);  Surgeon: Shirley Friar, MD;  Location[facility] END[facility];  Service: Endoscopy;  Laterality: Left;   No family history on file. History   Social History  . Marital Status:  Widowed    Spouse Name: N/A    Number of Children: N/A  . Years of Education: N/A   Occupational History  . Not on file.   Social History Main Topics  . Smoking status: Never Smoker   . Smokeless tobacco: Not on file  . Alcohol Use: No  . Drug Use: No  . Sexual Activity: Not on file   Other Topics Concern  . Not on file   Social History Narrative   [place] immigrant   Housewife   Widowed   Lives w/ her son  Review of Systems: Review of Systems  Constitutional: Negative for fever, chills, weight loss, malaise/fatigue and diaphoresis.  HENT: Negative for congestion, ear discharge, ear pain, hearing loss, nosebleeds, sore throat and tinnitus.   Eyes: Negative for blurred vision, double vision and photophobia.  Respiratory: Positive for cough, shortness of breath and wheezing. Negative for hemoptysis, sputum production and stridor.   Cardiovascular: Positive for leg swelling. Negative for chest pain, palpitations, orthopnea, claudication and PND.       Chest tightness  Gastrointestinal: Positive for heartburn. Negative for nausea, vomiting, abdominal pain, diarrhea, constipation, blood in stool and melena.  Genitourinary: Positive for dysuria. Negative for urgency, frequency, hematuria and flank pain.  Musculoskeletal: Positive for back pain. Negative for myalgias, joint pain, falls and neck pain.  Skin: Negative for itching and rash.  Neurological: Negative for dizziness, tingling, tremors, sensory change, speech change, focal weakness, seizures, loss of consciousness, weakness and headaches.  Endo/Heme/Allergies: Negative.   Psychiatric/Behavioral: Negative for depression, suicidal ideas, hallucinations and substance abuse. The patient is not nervous/anxious.      Physical Exam: Blood pressure 168/112, pulse 75, temperature 97.9 F (36.6 C), temperature source Oral, resp. rate 16, SpO2 100 %. Physical Exam  Constitutional: She is oriented to person, place, and time. She  appears well-developed and well-nourished. No distress.  HENT:  Head: Normocephalic and atraumatic.  Right Ear: External ear normal.  Left Ear: External ear normal.  Nose: Nose normal.  Mouth/Throat: Oropharynx is clear and moist. No oropharyngeal exudate.  Eyes: Conjunctivae and EOM are normal. Pupils are equal, round, and reactive to light. Right eye exhibits no discharge. Left eye exhibits no discharge. No scleral icterus.  Neck: Normal range of motion. Neck supple. No JVD present. No tracheal deviation present.  Cardiovascular: Normal rate, regular rhythm and normal heart sounds.  Exam reveals no gallop and no friction rub.   Has systolic murmur, suggestive of MR.   Respiratory: Effort normal and breath sounds normal. No stridor. No respiratory distress. She exhibits no tenderness.  Breathing comfortably. No distress noted. No wheezing or rales/crackles.   GI: Soft. She exhibits no distension and no mass. There is no tenderness. There is no rebound and no guarding.  Feels some pressure when palpating but no pain anywhere.  Musculoskeletal: Normal range of motion.  No tenderness on spinous processes. Has some TTP on lower back musculature bilaterally.  1+ pitting edema, equally on both legs. No calf swelling or tenderness to palpation.  Lymphadenopathy:    She has no cervical adenopathy.  Neurological: She is alert and oriented to person, place, and time. She has normal reflexes. She displays normal reflexes. No cranial nerve deficit. She exhibits normal muscle tone. Coordination normal.  Skin: Skin is warm. No rash noted. She is not diaphoretic. No erythema. No pallor.  Psychiatric: She has a normal mood and affect.   Lab results: Basic Metabolic Panel:  Recent Labs  16/10/96 2305  NA 135  K 3.9  CL 109  CO2 18*  GLUCOSE 271*  BUN 33*  CREATININE 3.27*  CALCIUM 8.4   Liver Function Tests: No results for input(s): AST, ALT, ALKPHOS, BILITOT, PROT, ALBUMIN in the last 72  hours. No results for input(s): LIPASE, AMYLASE in the last 72 hours. No results for input(s): AMMONIA in the last 72 hours. CBC:  Recent Labs  04/28/14 2305  WBC 6.8  HGB 9.1*  HCT 27.4*  MCV 91.3  PLT 127*   Cardiac Enzymes:  Recent Labs  04/28/14 2305  TROPONINI <0.03   BNP: No results for input(s): PROBNP in the last 72 hours. D-Dimer: No results for input(s): DDIMER in the last 72 hours. CBG: No results for input(s): GLUCAP in the last 72 hours. Hemoglobin A1C: No results for input(s): HGBA1C in the last 72 hours. Fasting Lipid Panel: No results for input(s): CHOL, HDL, LDLCALC, TRIG, CHOLHDL, LDLDIRECT in the last 72 hours. Thyroid Function Tests: No results for input(s): TSH, T4TOTAL, FREET4, T3FREE, THYROIDAB in the last 72 hours. Anemia Panel: No results for input(s): VITAMINB12, FOLATE, FERRITIN, TIBC, IRON, RETICCTPCT in the last 72 hours. Coagulation: No results for input(s): LABPROT, INR in the last 72 hours. Urine Drug Screen: Drugs of Abuse  No results found for: LABOPIA, COCAINSCRNUR, LABBENZ, AMPHETMU, THCU, LABBARB  Alcohol Level: No results for input(s): ETH in the last 72 hours. Urinalysis: No results for input(s): COLORURINE, LABSPEC, PHURINE, GLUCOSEU, HGBUR, BILIRUBINUR, KETONESUR, PROTEINUR, UROBILINOGEN, NITRITE, LEUKOCYTESUR in the last 72 hours.  Invalid input(s): APPERANCEUR Misc. Labs:  Imaging results:  Dg Chest 2 View  04/28/2014   CLINICAL DATA:  Shortness of breath, centralized chest pain.  EXAM: CHEST  2 VIEW  COMPARISON:  04/23/2014  FINDINGS: Prior CABG. Heart is borderline in size. Hyperinflation of the lungs. Areas of scarring in the upper lobes and lung bases. No acute airspace opacities or effusions No acute bony abnormality.  IMPRESSION: COPD/chronic changes.  No active disease.   Electronically Signed   By: Charlett Nose M.D.   On: 04/28/2014 23:11    Other results: EKG: nsr, Q waves II, III, AFV. nonspecifif T wave  changes.   Assessment & Plan by Problem: Principal Problem:   SOB (shortness of breath) Active Problems:   Essential hypertension   Coronary atherosclerosis   Chronic renal failure, stage 4 (severe)   Type II diabetes mellitus   Back pain   Normocytic anemia   Fistula, intestinovesical   Diastolic dysfunction   Metabolic acidosis, normal anion gap (NAG) 80 yo female with CAD s/p cabg 2008, recurrent UTIs, chronic back pain (s/p central and lateral decompression L3-4, L4-5, L5-S1), here with SOB and chest tightness.  SOB - unclear etiology, with some chest tightness. Started 6 PM tonight. No hx of blood clots. Doesn't describe chest pain, but more like chest tightness. Does have some leg edema. CXR shows no acute changes other than COPD. Patient does not carry formal COPD diagnosis but does use proair at home.   DDX include MI, COPD exc, PE, CHF exc.  Modified geneva low-moderate. No tachycardia, hx is not clearly suggestive of PE but ED provider was suspicious of PE and started heparin drip. I am not impressed that this is PE. She is breathing fine and satting 99% on room air, not tachycardic but she is on betablocker. However, it's hard to tell without diagnostic workup as her history could be misinterpretated due to language barrier.  no EKG changes but at risk of MI given CAD s/p CABG. She does have leg edema but lungs sound clear and cxr doesn't show any edema. I don't think she is in CHF exacerbation. Will not treat her with diuretics at this time as her kidney function worsened last time with lasix.  - will trend trops. Repeat EKG in AM. - Did not order VQ scan as it will not be done tonight anyway due to radiology staffing. Will discuss with morning team before ordering VQ scan.but will continue heparin drip for now. - duoneb to help with breathing/chest tightness as patient  said her inhaler did help some with her symptoms at home. - O2 PRN to keep sat >92%.  Recurrent UTI - was  recently admitted with E.coli ucx + for ecoli. Patient is afebrile and no WBC count. Doesn't appear septic. No n/v.  - was discharged on augmentin 500-125mg  BID 7 days on 04/24/14. Having some burning with urination right now. Urology was consulted previous admission for her possible sigmoido-vesicular fistula that was seen on CT abdomen. Urology didn't think UTI was caused by the fistula, it was thought to be just cystitis. GI also didn't think she had a sigmoido-vesicular fistula.. But patient does say she has some bubbling/pneumoturia when she urinates. So whether sigmoido-vesicular fistula is the cause remains a possibility. - will continue augmentin for now for 3 more days. - UA shows improvement of previous UTI as now nitrite neg, and only few bacteria compared to (many before)., ucx pending  pneumaturia - with possible questionable sigmoido-vesicular fistula - was seen on CT abdomen last admission but GI and urology doesn't think she has true fistula.  - urology recommended outpatient cystoscopy.   Back pain 2/2 to spinal stenosis at baseline, s/p central and lateral decmpression L3-4, L4-5, L5-S1)  - takes tramadol at home, will cont this.  Chronic diastolic dysfunction, last ECHO 2013 EF 55-60%, grade 2 diastolic dysfunction - I don't think she is in exacerbation currently even though she has lower ext edema. Lungs sound clear and cxr doesn't show any edema. She appears euvolemic overall. Continue to monitor for now, may consider diuretic if volume status changes. - daily weights, strict i/o's.  Non gap metabolic acidosis - anion gap 8, bicarb 18.  - she had gap acidosis last admission which could have been from her UTI. But now having anion gap acidosis. Differential includes RTA and also sigmoido-vesicular fistula.  - will continue to monitor  CKD stage II - crt BL ~2.5-2.9. Now 3.27 - recently got lasix in the hospital and crt bumped from baseline. - hold lasix for now. Continue to  monitor.  COPD - COPD changes seen on CXR, uses proair at home for SOB/wheezing/chest tightness. Never formally diagnosed and no recent PFT's on chart. - needs outpatient PFT  Chronic normocytic anemia - likely 2/2 to CKD- baseline hgb around 10. Had some drop to ~8 last admission but now 9.1 - continue to monitor - had some melena? Last admission, is however on PO iron which could cause dark stool. EGD was done last admission and was normal.  CBD dilation - on ct 1.7 cm dilated, gallblader prominence and intrahepatic biliary duct dilation. T bili wnl last time. Was seen by GI last time. Determined to be chronci and unlikely to be obstructed. Recommended outpatient MRI/MRCP.  CAD s/p cabg - on asa, coreg - cont these.  DM II - takes novolog 8 units tid + lantus 20 units qhs - cont SSI, meal time and QHS correction and lantus 20 units from tomorrow. Took lantus tonight already at home.  HTN - takes norvasc 5 mg, coreg 25mg  BID, hydralazine 25mg  TID, imdur 30mg  24hr - cont home regimen.   Thrombocytopenia - b/l 110-130's. Today 127.  - has hx of hep B. LFT's recently normal.  HLD - cont lipitor. GERD - protonix at home.   Dispo: Disposition is deferred at this time, awaiting improvement of current medical problems. Anticipated discharge in approximately 1-2 day(s).   The patient does have a current PCP (Rich Number, MD) and does need an [facility] hospital  follow-up appointment after discharge.  The patient does have transportation limitations that hinder transportation to clinic appointments.  Signed: Hyacinth Meekerasrif Garrit Marrow, MD 04/29/2014, 2:11 AM

## 2014-04-29 NOTE — Progress Notes (Signed)
Subjective:  Patient was seen and examined this morning. Orlene Erm was through daughter who translated for patient. At 6 pm last night, patient became short of breath with a tight chest pain that came on after feeling a "pushing sensation from stomach into chest with cold air." She states the episodes are intermittent and have happened about 4 times since last night. It can be associated with lightheadedness. She does attribute this in similarity to previous episodes of reflux. Currently, she is not having any chest pain or shortness of breath. She denies nausea, vomiting or diaphoresis with the episodes. Patient states she also feels like she has to strain to urinate and when she urinates she feels air come out and has a burning pain. She also sees foam in the toilet after urination. She denies suprapubic or flank pain. No fevers or chills.   Objective: Vital signs in last 24 hours: Filed Vitals:   04/29/14 0900 04/29/14 1000 04/29/14 1100 04/29/14 1203  BP: 159/72 147/70 163/65 153/70  Pulse: 77 81 77 74  Temp:    98.5 F (36.9 C)  TempSrc:      Resp: 20 13 17 16   Weight:   52.9 kg (116 lb 10 oz)   SpO2: 99% 99% 100% 100%   Weight change:  No intake or output data in the 24 hours ending 04/29/14 1607  General: Vital signs reviewed.  Patient is well-developed and well-nourished, in no acute distress and cooperative with exam.  Cardiovascular: RRR, S1 normal, S2 normal, no murmurs, gallops, or rubs. No reproducible chest pain with palpation. Pulmonary/Chest: Clear to auscultation bilaterally, no wheezes, rales, or rhonchi. Abdominal: Soft, non-tender, non-distended, BS + Extremities: 1+ pitting edema in lower extremities bilaterally, no calf tenderness or increased warmth bilaterally. Skin: Moderate ecchymosis along lower abdomen, non-tender.  Psychiatric: Normal mood and affect. Speech and behavior is normal. Cognition and memory are normal.   Lab Results: Basic Metabolic  Panel:  Recent Labs Lab 04/28/14 2305 04/29/14 0646  NA 135 137  K 3.9 3.4*  CL 109 111  CO2 18* 19  GLUCOSE 271* 196*  BUN 33* 30*  CREATININE 3.27* 2.90*  CALCIUM 8.4 8.2*   Liver Function Tests:  Recent Labs Lab 04/22/14 1821  AST 16  ALT 12  ALKPHOS 56  BILITOT 0.3  PROT 6.0  ALBUMIN 2.7*   CBC:  Recent Labs Lab 04/24/14 0324 04/28/14 2305  WBC 7.7 6.8  HGB 8.9* 9.1*  HCT 27.6* 27.4*  MCV 90.5 91.3  PLT 155 127*   Cardiac Enzymes:  Recent Labs Lab 04/28/14 2305 04/29/14 0646 04/29/14 1259  TROPONINI <0.03 <0.03 <0.03   CBG:  Recent Labs Lab 04/24/14 0006 04/24/14 0442 04/24/14 0758 04/24/14 1135 04/29/14 0837 04/29/14 1207  GLUCAP 156* 139* 102* 242* 175* 243*   Urinalysis:  Recent Labs Lab 04/29/14 0153  COLORURINE YELLOW  LABSPEC 1.011  PHURINE 7.0  GLUCOSEU 500*  HGBUR NEGATIVE  BILIRUBINUR NEGATIVE  KETONESUR NEGATIVE  PROTEINUR >300*  UROBILINOGEN 0.2  NITRITE NEGATIVE  LEUKOCYTESUR TRACE*   Micro Results: Recent Results (from the past 240 hour(s))  Urine culture     Status: None   Collection Time: 04/20/14  6:22 PM  Result Value Ref Range Status   Specimen Description URINE, RANDOM  Final   Special Requests ADDED 0241 04/21/14  Final   Culture  Setup Time   Final    04/21/2014 16:52 Performed at [facility]    Colony Count   Final    >=  100,000 COLONIES/ML [facility]    Culture   Final    ESCHERICHIA COLI Performed at [facility]    Report Status 04/23/2014 FINAL  Final   Organism ID, Bacteria ESCHERICHIA COLI  Final      Susceptibility   Escherichia coli - MIC*    AMPICILLIN 4 SENSITIVE Sensitive     CEFAZOLIN <=4 SENSITIVE Sensitive     CEFTRIAXONE <=1 SENSITIVE Sensitive     CIPROFLOXACIN >=4 RESISTANT Resistant     GENTAMICIN <=1 SENSITIVE Sensitive     LEVOFLOXACIN >=8 RESISTANT Resistant     NITROFURANTOIN <=16 SENSITIVE Sensitive     TOBRAMYCIN <=1  SENSITIVE Sensitive     TRIMETH/SULFA <=20 SENSITIVE Sensitive     PIP/TAZO <=4 SENSITIVE Sensitive     * ESCHERICHIA COLI   Studies/Results: Dg Chest 2 View  04/28/2014   CLINICAL DATA:  Shortness of breath, centralized chest pain.  EXAM: CHEST  2 VIEW  COMPARISON:  04/23/2014  FINDINGS: Prior CABG. Heart is borderline in size. Hyperinflation of the lungs. Areas of scarring in the upper lobes and lung bases. No acute airspace opacities or effusions No acute bony abnormality.  IMPRESSION: COPD/chronic changes.  No active disease.   Electronically Signed   By: Charlett NoseKevin  Dover M.D.   On: 04/28/2014 23:11   Medications: I have reviewed the patient's current medications. Prior to Admission:  Prescriptions prior to admission  Medication Sig Dispense Refill Last Dose  . acetaminophen (TYLENOL) 500 MG tablet Take 2 tablets (1,000 mg total) by mouth every 8 (eight) hours as needed for mild pain. 100 tablet 2 unknown  . amLODipine (NORVASC) 5 MG tablet Take 1 tablet (5 mg total) by mouth daily. 30 tablet 11 04/28/2014 at Unknown time  . aspirin EC 81 MG tablet TAKE 1 TABLET BY MOUTH EVERY DAY 150 tablet 1 04/28/2014 at Unknown time  . B-D ULTRAFINE III SHORT PEN 31G X 8 MM MISC USE AS DIRECTED TO INJECT INSULIN 3 TIMES A DAY 100 each 6 04/28/2014 at Unknown time  . BAYER CONTOUR NEXT TEST test strip TEST BLOOD SUGAR UP TO 5 TIMES A DAY 100 each 11 04/28/2014 at Unknown time  . carvedilol (COREG) 25 MG tablet TAKE 1 TABLET (25 MG TOTAL) BY MOUTH 2 (TWO) TIMES DAILY WITH A MEAL. 120 tablet 3 04/28/2014 at 1800  . CRESTOR 20 MG tablet TAKE 1 TABLET (20 MG TOTAL) BY MOUTH DAILY. 90 tablet 3 04/28/2014 at Unknown time  . docusate sodium (COLACE) 100 MG capsule Take 1 capsule (100 mg total) by mouth daily as needed for mild constipation. 30 capsule 1 unknown  . hydrALAZINE (APRESOLINE) 25 MG tablet Take 1 tablet (25 mg total) by mouth 3 (three) times daily. 270 tablet 5 04/28/2014 at Unknown time  . isosorbide  mononitrate (IMDUR) 30 MG 24 hr tablet TAKE 1 TABLET (30 MG TOTAL) BY MOUTH DAILY. 90 tablet 3 04/28/2014 at Unknown time  . LANTUS 100 UNIT/ML injection INJECT 0.2 MLS (20 UNITS TOTAL) INTO THE SKIN AT BEDTIME. 10 mL 5 04/28/2014 at Unknown time  . nitroGLYCERIN (NITROSTAT) 0.4 MG SL tablet Place 0.4 mg under the tongue every 5 (five) minutes as needed. For chest pain. Maximum of 3 doses   unknown  . NOVOLOG FLEXPEN 100 UNIT/ML FlexPen INJECT 8 UNITS INTO THE SKIN THREE TIMES DAILY BEFORE MEALS. 100 mL 9 04/28/2014 at Unknown time  . PROAIR HFA 108 (90 BASE) MCG/ACT inhaler INHALE 1 PUFF EVERY 6  HOURS AS NEEDED FOR WHEEZE 8.5 each 2 unknown  . calcitRIOL (ROCALTROL) 0.25 MCG capsule Take 2 capsules (0.5 mcg total) by mouth daily. (Patient not taking: Reported on 04/20/2014) 60 capsule 5 Not Taking at Unknown time  . calcium citrate-vitamin D (CITRACAL+D) 315-200 MG-UNIT per tablet Take 2 tablets by mouth 2 (two) times daily. (Patient not taking: Reported on 04/29/2014) 100 tablet 3 04/20/2014 at Unknown time  . ferrous sulfate 325 (65 FE) MG tablet TAKE 1 TABLET BY MOUTH 3 TIMES DAILY WITH MEALS. (Patient not taking: Reported on 04/20/2014) 90 tablet 5 Not Taking at Unknown time  . pantoprazole (PROTONIX) 20 MG tablet Take 2 tablets (40 mg total) by mouth daily. (Patient not taking: Reported on 04/20/2014) 30 tablet 1 Not Taking at Unknown time  . sennosides-docusate sodium (SENOKOT-S) 8.6-50 MG tablet Take 1 tablet by mouth daily. (Patient not taking: Reported on 04/20/2014) 90 tablet 3 Not Taking at Unknown time  . traMADol (ULTRAM) 50 MG tablet Take 1 tablet (50 mg total) by mouth every 6 (six) hours as needed for moderate pain. (Patient not taking: Reported on 04/20/2014) 20 tablet 0 Not Taking at Unknown time   Scheduled Meds: . sodium chloride   Intravenous Once  . amLODipine  5 mg Oral Daily  . amoxicillin-clavulanate  1 tablet Oral BID  . aspirin EC  81 mg Oral Daily  . calcitRIOL  0.5 mcg  Oral Daily  . calcium-vitamin D  1 tablet Oral BID WC  . carvedilol  25 mg Oral BID WC  . gi cocktail  30 mL Oral Once  . heparin subcutaneous  5,000 Units Subcutaneous 3 times per day  . hydrALAZINE  25 mg Oral TID  . insulin aspart  0-15 Units Subcutaneous TID WC  . insulin aspart  0-5 Units Subcutaneous QHS  . insulin aspart  4 Units Subcutaneous TID WC  . insulin glargine  20 Units Subcutaneous QHS  . ipratropium-albuterol  3 mL Nebulization Q6H  . isosorbide mononitrate  30 mg Oral Daily  . pantoprazole  40 mg Oral Daily  . potassium chloride  40 mEq Oral Once  . rosuvastatin  20 mg Oral Daily  . sodium chloride  3 mL Intravenous Q12H   Continuous Infusions:   PRN Meds:.traMADol Assessment/Plan: Principal Problem:   SOB (shortness of breath) Active Problems:   Essential hypertension   Coronary atherosclerosis   Chronic renal failure, stage 4 (severe)   Type II diabetes mellitus   Back pain   Normocytic anemia   Fistula, intestinovesical   Diastolic dysfunction   Metabolic acidosis, normal anion gap (NAG)  Angina vs GERD: Unclear etiology. DDX include GERD, MI, COPD exc, PE, CHF exc.Modified geneva low-moderate. Patient was started on a heparin drip in the ED for possible PE. Chest pain is not reproducible with palpation, lungs clear to ausculation bilaterally, no evidence of wheezes, rhonchi or rales. No EKG changes, Troponin negative x 3, CXR shows COPD and chronic changes. I doubt PE given description of symptoms, few risk factors, and low Geneva score. Pain was not a sudden onset with sharp chest pain. Given physical exam and CXR, COPD exacerbation and CHF exacerbation are also low on differential. Without EKG or positive troponins, there is less concern for MI. However, patient has significant cardiac history with CABG in 2008, repeat cath in 2012. Clinically, her intermittent mild shortness of breath and chest tightness may be due to GERD symptoms. Recent EGD on 04/22/14  was normal.  -Repeat EKG -Discontinue  Heparin gtt -Consider Duonebs Q6H  -O2 PRN to keep sat >92% -Protonix 40 mg daily -GI cocktail once -Carb modified diet -Consult to Cardiology  Hypokalemia: Potassium is 3.4 this morning. -KDur 40 mEq once -Repeat BMET tomorrow am  History of Recurrent UTI: Patient has a history of recurrent UTI. She was recently admitted with E. Coli UTI on 04/24/14 with 7 days of Augmentin. Patient complains of dysuria and pneumaturia. Afebrile, no leukocytosis. UA shows proteinuria, but negative nitrites, trace leukocytes, negative hemoglobin, few bacteria, which is improved from prior. CT abdomen at that time showed small to moderate amount of air noted along the posterior wall of the bladder, with mild associated soft tissue stranding. A small fistula from the sigmoid colon could not be entirely excluded, though there is no evidence of contrast extending into the bladder. DDx includes fistula versus emphysematous cystitis. No recent history of foley. GI and Urology doubted sigmoido-vesicular fistula and recommend outpatient cystoscopy .  -Augmentin 500-125 mg BID for 3 more days (End 05/01/14) -Outpatient cystoscopyversus CT cystogram  Spinal Stenosis: S/p central and lateral decmpression L3-4, L4-5, L5-S1 with chronic pain at baseline. Patient takes tramadol at home. -Tramadol 50 mg Q6H prn  Chronic Grade 2 Diastolic CHF: Last ECHO 2013 EF 55-60%, grade 2 diastolic dysfunction. Patient has lower extremity edema, but not currently in an exacerbation. CXR shows no acute changes. BNP elevated at 539.4. Lungs CTA b/l. -Avoid Lasix for now given volume status and h/o worsening renal function with Lasix. -Daily weights -Strict I/O's -Carvedilol 25 mg BID  Non-Anion Gap Metabolic Acidosis: Anion gap 8, bicarb 18>19. Differential includes RTA and also sigmoido-vesicular fistula.  -Monitor  -Repeat BMET in am  CKD stage II: Baseline creatinine ~2.5-2.9, but 3.27 on  admission which could be secondary to recent lasix. -Hold Lasix -Monitor -Repeat BMET in am  COPD: COPD changes seen on CXR, uses proair at home for SOB/wheezing/chest tightness, but never formally diagnosed and no recent PFT's on chart. -Outpatient PFTs -Duonebs Q6H  Chronic Normocytic Anemia: Likely 2/2 to CKD. Baseline hgb around 10, 9.1 on admission. EGD last admission normal. On po iron at home. -Monitor -Repeat CBC in am  CBD Dilation: CT abdomen during last admission showed 1.7 cm dilated, gallblader prominence and intrahepatic biliary duct dilation. GI determined this to be chronic and unlikely to be obstructed.  -Recommended outpatient MRI/MRCP if clinically warranted   CAD s/p CABG: Patient is on ASA, coreg, and Imdur at home.  -Continue ASA 81 mg daily -Continue Coreg 25 mg BID -Continue Imdur 30 mg daily  DM II: Patient takes novolog 8 units tid and lantus 20 units qhs at home. CBG 271 on admission. HgbA1c 8.1 on 04/21/2014. -SSI, meal time and QHS correction  -Lantus 20 units starting tonight -Novolog 4 units TID WC   HTN: BP 144/62 this morning. Patient takes norvasc 5 mg, coreg 25mg  BID, hydralazine 25mg  TID, and imdur 30mg  daily at home.  -Continue home regimen   Thrombocytopenia: Baseline 110-130's. 127 on admission. Hx of Hep B. LFT's recently normal. -Monitor -Repeat CBC tomorrow am  DVT/PE ppx:  Heparin SQ TID  Dispo: Disposition is deferred at this time, awaiting improvement of current medical problems.  Anticipated discharge in approximately 1-2 day(s).   The patient does have a current PCP (Rich Number, MD) and does need an [facility] hospital follow-up appointment after discharge.  The patient does not have transportation limitations that hinder transportation to clinic appointments.  .Services Needed at time of discharge: Y =  Yes, Blank = No PT:   OT:   RN:   Equipment:   Other:     LOS: 1 day   Jill Alexanders, DO PGY-1 Internal Medicine  Resident Pager # [phone] 04/29/2014 4:07 PM

## 2014-04-29 NOTE — Progress Notes (Signed)
ANTICOAGULATION CONSULT NOTE  Pharmacy Consult for Heparin  Indication: r/o DVT/PE  Allergies  Allergen Reactions  . Cozaar Other (See Comments)    Sig increase in her creatinine that resolved once it was D/C'd  Per Cr visible in epic, it seems her baseline Cr = 1.7-1.9, and the slight improvement to Cr was an outlier when compared to recent Cr.  We will do a trial of Lisinopril (and monitor for 30% rise in Cr), as she would greatly benefit from renal protection.  . Lisinopril     Cough     Patient Measurements: 52.6kg  Vital Signs: BP: 159/72 mmHg (12/28 0900) Pulse Rate: 77 (12/28 0900)  Labs:  Recent Labs  04/28/14 2305 04/29/14 0646 04/29/14 0855  HGB 9.1*  --   --   HCT 27.4*  --   --   PLT 127*  --   --   HEPARINUNFRC  --   --  0.53  CREATININE 3.27* 2.90*  --   TROPONINI <0.03 <0.03  --     Estimated Creatinine Clearance: 10.9 mL/min (by C-G formula based on Cr of 2.9).   Medical History: Past Medical History  Diagnosis Date  . Diabetes mellitus, type 2   . GERD (gastroesophageal reflux disease)   . Hepatitis B   . Hyperlipemia   . Hypertension   . Cervical dysplasia   . Atrophic vaginitis   . Spinal stenosis     s/p decompression  . CAD (coronary artery disease) April 2008    s/p CABG;    cath 8/12:  LM patent, pLAD occluded, OM1 30-40%, pOM2 80% and 60% after the anastomosis, pRCA occluded, S-DX occluded (old), S-OM 2 occluded (new), L-LAD patent, dLAD provided collats to the PDA; Med Rx  rec.; consider PCI to OM2 if fails med Rx  . Hx of hysterectomy   . UTI (lower urinary tract infection)   . Diverticulosis of intestine 04/29/2014    Of ascending colon noted on CT abd/ pelvis 04/20/14    Assessment: 80 y/o F with recent DC from hospital. Back with feet/leg swelling, CP, shortness of breath. Concern for DVT/PE, imaging available later today. Initial heparin level therapeutic 0.53.    Goal of Therapy:  Heparin level 0.3-0.7 units/ml Monitor  platelets by anticoagulation protocol: Yes   Plan:  -Cont heparin drip at 700 units/hr -HL later today to confirm -F/U VTE work-up  Juanya Villavicencio Poteet 04/29/2014,10:59 AM

## 2014-04-30 DIAGNOSIS — I25110 Atherosclerotic heart disease of native coronary artery with unstable angina pectoris: Secondary | ICD-10-CM

## 2014-04-30 DIAGNOSIS — R079 Chest pain, unspecified: Secondary | ICD-10-CM | POA: Insufficient documentation

## 2014-04-30 DIAGNOSIS — I200 Unstable angina: Secondary | ICD-10-CM | POA: Insufficient documentation

## 2014-04-30 DIAGNOSIS — K219 Gastro-esophageal reflux disease without esophagitis: Secondary | ICD-10-CM | POA: Diagnosis not present

## 2014-04-30 DIAGNOSIS — N184 Chronic kidney disease, stage 4 (severe): Secondary | ICD-10-CM

## 2014-04-30 DIAGNOSIS — I255 Ischemic cardiomyopathy: Secondary | ICD-10-CM

## 2014-04-30 DIAGNOSIS — I369 Nonrheumatic tricuspid valve disorder, unspecified: Secondary | ICD-10-CM

## 2014-04-30 LAB — URINE CULTURE: Colony Count: 15000

## 2014-04-30 LAB — CBC
HEMATOCRIT: 27.9 % — AB (ref 36.0–46.0)
Hemoglobin: 8.9 g/dL — ABNORMAL LOW (ref 12.0–15.0)
MCH: 29.4 pg (ref 26.0–34.0)
MCHC: 31.9 g/dL (ref 30.0–36.0)
MCV: 92.1 fL (ref 78.0–100.0)
Platelets: 128 10*3/uL — ABNORMAL LOW (ref 150–400)
RBC: 3.03 MIL/uL — ABNORMAL LOW (ref 3.87–5.11)
RDW: 14.5 % (ref 11.5–15.5)
WBC: 6.2 10*3/uL (ref 4.0–10.5)

## 2014-04-30 LAB — BASIC METABOLIC PANEL
ANION GAP: 9 (ref 5–15)
BUN: 24 mg/dL — AB (ref 6–23)
CO2: 18 mmol/L — ABNORMAL LOW (ref 19–32)
Calcium: 8.9 mg/dL (ref 8.4–10.5)
Chloride: 113 mEq/L — ABNORMAL HIGH (ref 96–112)
Creatinine, Ser: 2.64 mg/dL — ABNORMAL HIGH (ref 0.50–1.10)
GFR calc Af Amer: 18 mL/min — ABNORMAL LOW (ref >90)
GFR calc non Af Amer: 16 mL/min — ABNORMAL LOW (ref >90)
Glucose, Bld: 106 mg/dL — ABNORMAL HIGH (ref 70–99)
POTASSIUM: 3.7 mmol/L (ref 3.5–5.1)
Sodium: 140 mmol/L (ref 135–145)

## 2014-04-30 LAB — GLUCOSE, CAPILLARY
GLUCOSE-CAPILLARY: 161 mg/dL — AB (ref 70–99)
GLUCOSE-CAPILLARY: 194 mg/dL — AB (ref 70–99)
GLUCOSE-CAPILLARY: 110 mg/dL — AB (ref 70–99)
Glucose-Capillary: 102 mg/dL — ABNORMAL HIGH (ref 70–99)

## 2014-04-30 MED ORDER — ISOSORBIDE MONONITRATE ER 60 MG PO TB24: 60.0000 mg | ORAL_TABLET | Freq: Every day | ORAL | Status: DC

## 2014-04-30 MED ORDER — AMOXICILLIN-POT CLAVULANATE 500-125 MG PO TABS: 1.0000 | ORAL_TABLET | Freq: Two times a day (BID) | ORAL | Status: DC

## 2014-04-30 MED ORDER — PANTOPRAZOLE SODIUM 40 MG PO TBEC: 40.0000 mg | DELAYED_RELEASE_TABLET | Freq: Two times a day (BID) | ORAL | Status: DC

## 2014-04-30 NOTE — Progress Notes (Addendum)
Subjective:  Patient was seen and examined this morning. Translator phone was used for conversation. Patient states she is doing better today and denies any complaints. She has had no more recurrent chest pain or shortness of breath since yesterday morning. She does believe that her shortness of breath and chest tightness if related to her indigestion as it starts from her stomach and radiates into her chest and throat and is associated with feeling weak, nauseous, and short of breath during the episodes. She has been taking her Protonix for this at home.   Objective: Vital signs in last 24 hours: Filed Vitals:   04/29/14 2100 04/29/14 2223 04/30/14 0622 04/30/14 0645  BP: 149/72  171/77 160/81  Pulse: 78  79 80  Temp: 98.6 F (37 C)  98.2 F (36.8 C)   TempSrc: Oral  Oral   Resp: 18  16   Weight:   49.9 kg (110 lb 0.2 oz)   SpO2: 100% 100% 96%    Weight change:   Intake/Output Summary (Last 24 hours) at 04/30/14 0940 Last data filed at 04/29/14 2000  Gross per 24 hour  Intake    270 ml  Output      0 ml  Net    270 ml    General: Vital signs reviewed.  Patient is well-developed and well-nourished, in no acute distress and cooperative with exam.  Cardiovascular: RRR, S1 normal, S2 normal, no murmurs, gallops, or rubs. No reproducible chest pain with palpation. Pulmonary/Chest: Clear to auscultation bilaterally, no wheezes, rales, or rhonchi. Abdominal: Soft, non-tender, non-distended, BS + Extremities: Trace edema in ankles bilaterally, no calf tenderness or increased warmth bilaterally. Skin: Moderate ecchymosis along lower abdomen, non-tender.  Psychiatric: Normal mood and affect. Speech and behavior is normal. Cognition and memory are normal.   Lab Results: Basic Metabolic Panel:  Recent Labs Lab 04/29/14 0646 04/30/14 0655  NA 137 140  K 3.4* 3.7  CL 111 113*  CO2 19 18*  GLUCOSE 196* 106*  BUN 30* 24*  CREATININE 2.90* 2.64*  CALCIUM 8.2* 8.9    CBC:  Recent Labs Lab 04/28/14 2305 04/30/14 0655  WBC 6.8 6.2  HGB 9.1* 8.9*  HCT 27.4* 27.9*  MCV 91.3 92.1  PLT 127* 128*   Cardiac Enzymes:  Recent Labs Lab 04/28/14 2305 04/29/14 0646 04/29/14 1259  TROPONINI <0.03 <0.03 <0.03   CBG:  Recent Labs Lab 04/24/14 1135 04/29/14 0837 04/29/14 1207 04/29/14 1616 04/29/14 2100 04/30/14 0651  GLUCAP 242* 175* 243* 220* 153* 102*   Urinalysis:  Recent Labs Lab 04/29/14 0153  COLORURINE YELLOW  LABSPEC 1.011  PHURINE 7.0  GLUCOSEU 500*  HGBUR NEGATIVE  BILIRUBINUR NEGATIVE  KETONESUR NEGATIVE  PROTEINUR >300*  UROBILINOGEN 0.2  NITRITE NEGATIVE  LEUKOCYTESUR TRACE*   Micro Results: Recent Results (from the past 240 hour(s))  Urine culture     Status: None   Collection Time: 04/20/14  6:22 PM  Result Value Ref Range Status   Specimen Description URINE, RANDOM  Final   Special Requests ADDED 0241 04/21/14  Final   Culture  Setup Time   Final    04/21/2014 16:52 Performed at [facility]    Colony Count   Final    >=100,000 COLONIES/ML Performed at [facility]    Culture   Final    ESCHERICHIA COLI Performed at [facility]    Report Status 04/23/2014 FINAL  Final   Organism ID, Bacteria ESCHERICHIA COLI  Final  Susceptibility   Escherichia coli - MIC*    AMPICILLIN 4 SENSITIVE Sensitive     CEFAZOLIN <=4 SENSITIVE Sensitive     CEFTRIAXONE <=1 SENSITIVE Sensitive     CIPROFLOXACIN >=4 RESISTANT Resistant     GENTAMICIN <=1 SENSITIVE Sensitive     LEVOFLOXACIN >=8 RESISTANT Resistant     NITROFURANTOIN <=16 SENSITIVE Sensitive     TOBRAMYCIN <=1 SENSITIVE Sensitive     TRIMETH/SULFA <=20 SENSITIVE Sensitive     PIP/TAZO <=4 SENSITIVE Sensitive     * ESCHERICHIA COLI   Studies/Results: Dg Chest 2 View  04/28/2014   CLINICAL DATA:  Shortness of breath, centralized chest pain.  EXAM: CHEST  2 VIEW  COMPARISON:  04/23/2014  FINDINGS: Prior CABG. Heart is  borderline in size. Hyperinflation of the lungs. Areas of scarring in the upper lobes and lung bases. No acute airspace opacities or effusions No acute bony abnormality.  IMPRESSION: COPD/chronic changes.  No active disease.   Electronically Signed   By: Charlett NoseKevin  Dover M.D.   On: 04/28/2014 23:11   Medications: I have reviewed the patient's current medications. Prior to Admission:  Prescriptions prior to admission  Medication Sig Dispense Refill Last Dose  . acetaminophen (TYLENOL) 500 MG tablet Take 2 tablets (1,000 mg total) by mouth every 8 (eight) hours as needed for mild pain. 100 tablet 2 unknown  . amLODipine (NORVASC) 5 MG tablet Take 1 tablet (5 mg total) by mouth daily. 30 tablet 11 04/28/2014 at Unknown time  . aspirin EC 81 MG tablet TAKE 1 TABLET BY MOUTH EVERY DAY 150 tablet 1 04/28/2014 at Unknown time  . B-D ULTRAFINE III SHORT PEN 31G X 8 MM MISC USE AS DIRECTED TO INJECT INSULIN 3 TIMES A DAY 100 each 6 04/28/2014 at Unknown time  . BAYER CONTOUR NEXT TEST test strip TEST BLOOD SUGAR UP TO 5 TIMES A DAY 100 each 11 04/28/2014 at Unknown time  . carvedilol (COREG) 25 MG tablet TAKE 1 TABLET (25 MG TOTAL) BY MOUTH 2 (TWO) TIMES DAILY WITH A MEAL. 120 tablet 3 04/28/2014 at 1800  . CRESTOR 20 MG tablet TAKE 1 TABLET (20 MG TOTAL) BY MOUTH DAILY. 90 tablet 3 04/28/2014 at Unknown time  . docusate sodium (COLACE) 100 MG capsule Take 1 capsule (100 mg total) by mouth daily as needed for mild constipation. 30 capsule 1 unknown  . hydrALAZINE (APRESOLINE) 25 MG tablet Take 1 tablet (25 mg total) by mouth 3 (three) times daily. 270 tablet 5 04/28/2014 at Unknown time  . isosorbide mononitrate (IMDUR) 30 MG 24 hr tablet TAKE 1 TABLET (30 MG TOTAL) BY MOUTH DAILY. 90 tablet 3 04/28/2014 at Unknown time  . LANTUS 100 UNIT/ML injection INJECT 0.2 MLS (20 UNITS TOTAL) INTO THE SKIN AT BEDTIME. 10 mL 5 04/28/2014 at Unknown time  . nitroGLYCERIN (NITROSTAT) 0.4 MG SL tablet Place 0.4 mg under the  tongue every 5 (five) minutes as needed. For chest pain. Maximum of 3 doses   unknown  . NOVOLOG FLEXPEN 100 UNIT/ML FlexPen INJECT 8 UNITS INTO THE SKIN THREE TIMES DAILY BEFORE MEALS. 100 mL 9 04/28/2014 at Unknown time  . PROAIR HFA 108 (90 BASE) MCG/ACT inhaler INHALE 1 PUFF EVERY 6 HOURS AS NEEDED FOR WHEEZE 8.5 each 2 unknown  . calcitRIOL (ROCALTROL) 0.25 MCG capsule Take 2 capsules (0.5 mcg total) by mouth daily. (Patient not taking: Reported on 04/20/2014) 60 capsule 5 Not Taking at Unknown time  . calcium citrate-vitamin D (CITRACAL+D) 315-200 MG-UNIT  per tablet Take 2 tablets by mouth 2 (two) times daily. (Patient not taking: Reported on 04/29/2014) 100 tablet 3 04/20/2014 at Unknown time  . ferrous sulfate 325 (65 FE) MG tablet TAKE 1 TABLET BY MOUTH 3 TIMES DAILY WITH MEALS. (Patient not taking: Reported on 04/20/2014) 90 tablet 5 Not Taking at Unknown time  . pantoprazole (PROTONIX) 20 MG tablet Take 2 tablets (40 mg total) by mouth daily. (Patient not taking: Reported on 04/20/2014) 30 tablet 1 Not Taking at Unknown time  . sennosides-docusate sodium (SENOKOT-S) 8.6-50 MG tablet Take 1 tablet by mouth daily. (Patient not taking: Reported on 04/20/2014) 90 tablet 3 Not Taking at Unknown time  . traMADol (ULTRAM) 50 MG tablet Take 1 tablet (50 mg total) by mouth every 6 (six) hours as needed for moderate pain. (Patient not taking: Reported on 04/20/2014) 20 tablet 0 Not Taking at Unknown time   Scheduled Meds: . sodium chloride   Intravenous Once  . amLODipine  5 mg Oral Daily  . amoxicillin-clavulanate  1 tablet Oral BID  . aspirin EC  81 mg Oral Daily  . calcitRIOL  0.5 mcg Oral Daily  . calcium-vitamin D  1 tablet Oral BID WC  . carvedilol  25 mg Oral BID WC  . gi cocktail  30 mL Oral Once  . heparin subcutaneous  5,000 Units Subcutaneous 3 times per day  . hydrALAZINE  25 mg Oral TID  . insulin aspart  0-15 Units Subcutaneous TID WC  . insulin aspart  0-5 Units Subcutaneous QHS   . insulin aspart  4 Units Subcutaneous TID WC  . insulin glargine  20 Units Subcutaneous QHS  . isosorbide mononitrate  60 mg Oral Daily  . pantoprazole  40 mg Oral BID  . potassium chloride  40 mEq Oral Once  . rosuvastatin  20 mg Oral Daily  . sodium chloride  3 mL Intravenous Q12H   Continuous Infusions:   PRN Meds:.ipratropium-albuterol, traMADol Assessment/Plan: Principal Problem:   SOB (shortness of breath) Active Problems:   Essential hypertension   Coronary atherosclerosis   Chronic renal failure, stage 4 (severe)   Type II diabetes mellitus   Back pain   Normocytic anemia   Fistula, intestinovesical   Diastolic dysfunction   Metabolic acidosis, normal anion gap (NAG)   Pain in the chest   Dyspnea  GERD: Patient was seen by Cardiology yesterday who doubted Cardiac cause as patient was ruled out for MI with 3 negative troponins and non-specific changes on EKG. Doubt CHF and COPD exacerbations given lack of acute findings on CXR, BNP 539, euvolemia, and lack of productive cough and good sats on room air. Cardiology recommended increasing Imdur to 60 mg, continuing coreg and checking a repeat echocardiogram. Upon discussion with the patient this morning, it seems more clear that her symptoms are related to uncontrolled reflux as she describes it as typical of her indigestion. She has not had any recurrent symptoms since yesterday morning. We have increased her PPI to BID and she has follow up in our clinic tomorrow. Upon discussion with Cardiology, patient will have repeat echo to assess baseline (see below). -Protonix 40 mg BID -GI cocktail once -Carb modified diet -Cardiology following, appreciate recommendations -Increased Imdur to 60 mg daily -Carvedilol 25 mg BID -NPO at midnight for myoview tomorrow  Hypokalemia: Potassium is 3.7 this morning. -Resolved  History of Recurrent UTI: Patient has a history of recurrent UTI with recent E. Coli UTI on 04/24/14 with 7 days  of  Augmentin. Patient continues to complain of dysuria and pneumaturia. This was thoroughly evaluated during last admission during which GI and Urology doubted sigmoido-vesicular fistula and recommend outpatient cystoscopy.  -Augmentin 500-125 mg BID for 2 more days (End 05/01/14) -Outpatient cystoscopyversus CT cystogram  Spinal Stenosis: S/p central and lateral decmpression L3-4, L4-5, L5-S1 with chronic pain at baseline. Patient takes tramadol at home. 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 Patient has trace to 1+ pitting lower extremity edema, but not currently in an exacerbation. CXR shows no acute changes. BNP elevated at 539.4. Lungs CTA b/l. Cardiology recommended repeating echocardiogram which showed new systolic dysfunction of 40-45%. Cardiology recommended myoview. -Myoview tomorrow -NPO at midnight -Daily weights -Strict I/O's -Carvedilol 25 mg BID  Chronic Non-Anion Gap Metabolic Acidosis: Low bicarb seems to be new since October 2015. Anion gap 8, bicarb 18>19>18. Differential includes RTA, early renal failure with impaired generation of ammonia and also sigmoido-vesicular fistula. Patient denies any diarrhea.  -Monitor  -Cystoscopy outpatient  CKD stage II: Baseline creatinine ~2.5-2.9, but 3.27 on admission secondary to recent lasix. This has trended down from 3.27>2.9>2.64. -Trending down -Follow up [facility] as outpatient  COPD: COPD changes seen on CXR, uses proair at home for SOB/wheezing/chest tightness, but never formally diagnosed and no recent PFT's on chart. Lungs are CTA b/l.  -Outpatient PFTs -Duonebs Q6H prn  Chronic Normocytic Anemia: Likely 2/2 to CKD. Baseline hgb around 10, 9.1 on admission> 8.9 today, stable. EGD last admission normal. On po iron at home. -Stable  CBD Dilation: CT abdomen during last admission showed 1.7 cm dilated, gallblader prominence and  intrahepatic biliary duct dilation. GI determined this to be chronic and unlikely to be obstructed.  -Recommended outpatient MRI/MRCP if clinically warranted   CAD s/p CABG: Patient is on ASA, coreg, and Imdur at home. Cardiology recommended increasing Imdur to 60 mg daily. -Continue ASA 81 mg daily -Continue Coreg 25 mg BID -Increased Imdur to 60 mg daily  DM II: Patient takes novolog 8 units tid and lantus 20 units qhs at home. CBG 271 on admission. HgbA1c 8.1 on 04/21/2014. -SSI, meal time and QHS correction  -Lantus 20 units starting tonight -Novolog 4 units TID WC   HTN: BP 160/81 this morning likely secondary to missed medications yesterday in transition of care from ED to Floor. Patient takes norvasc 5 mg, coreg 25mg  BID, hydralazine 25mg  TID, and imdur 30mg  daily at home.  -Continue home regimen with increase of Imdur to 60 mg daily.   Thrombocytopenia: Baseline 110-130's. 127 on admission> 128 today, stable. Hx of Hep B. LFT's recently normal. -Stable  DVT/PE ppx:  Heparin SQ TID  Dispo: Disposition is deferred at this time, awaiting improvement of current medical problems.  Anticipated discharge in approximately 1-2 day(s).   The patient does have a current PCP (Rich Numberarly Rivet, MD) and [facility].  The patient does not have transportation limitations that hinder transportation to clinic appointments.  .Services Needed at time of discharge: Y = Yes, Blank = No PT:   OT:   RN:   Equipment:   Other:     LOS: 2 days   Jill AlexandersAlexa Richardson, DO PGY-1 Internal Medicine Resident Pager # [phone] 04/30/2014 9:40 AM

## 2014-04-30 NOTE — Progress Notes (Signed)
  Echocardiogram 2D Echocardiogram has been performed.  Leta JunglingCooper, Kimika Streater M 04/30/2014, 9:31 AM

## 2014-04-30 NOTE — Progress Notes (Signed)
Patient Name: Bianca [name] Date of Encounter: 04/30/2014  Cardiologist: Antoine Poche   Principal Problem:   SOB (shortness of breath) Active Problems:   Essential hypertension   Coronary atherosclerosis   Chronic renal failure, stage 4 (severe)   Type II diabetes mellitus   Back pain   Normocytic anemia   Fistula, intestinovesical   Diastolic dysfunction   Metabolic acidosis, normal anion gap (NAG)   Pain in the chest   Dyspnea    SUBJECTIVE  Chest pain free now. No SOB. Had 5-10 min epigastric pain radiating to the chest 2-3 times yesterday. Resolved.   CURRENT MEDS . sodium chloride   Intravenous Once  . amLODipine  5 mg Oral Daily  . amoxicillin-clavulanate  1 tablet Oral BID  . aspirin EC  81 mg Oral Daily  . calcitRIOL  0.5 mcg Oral Daily  . calcium-vitamin D  1 tablet Oral BID WC  . carvedilol  25 mg Oral BID WC  . gi cocktail  30 mL Oral Once  . heparin subcutaneous  5,000 Units Subcutaneous 3 times per day  . hydrALAZINE  25 mg Oral TID  . insulin aspart  0-15 Units Subcutaneous TID WC  . insulin aspart  0-5 Units Subcutaneous QHS  . insulin aspart  4 Units Subcutaneous TID WC  . insulin glargine  20 Units Subcutaneous QHS  . isosorbide mononitrate  60 mg Oral Daily  . pantoprazole  40 mg Oral BID  . potassium chloride  40 mEq Oral Once  . rosuvastatin  20 mg Oral Daily  . sodium chloride  3 mL Intravenous Q12H    OBJECTIVE  Filed Vitals:   04/29/14 2100 04/29/14 2223 04/30/14 0622 04/30/14 0645  BP: 149/72  171/77 160/81  Pulse: 78  79 80  Temp: 98.6 F (37 C)  98.2 F (36.8 C)   TempSrc: Oral  Oral   Resp: 18  16   Height:   5' (1.524 m)   Weight:   110 lb 0.2 oz (49.9 kg)   SpO2: 100% 100% 96%     Intake/Output Summary (Last 24 hours) at 04/30/14 1043 Last data filed at 04/29/14 2000  Gross per 24 hour  Intake    270 ml  Output      0 ml  Net    270 ml   Filed Weights   04/29/14 1100 04/30/14 0622  Weight: 116 lb 10 oz (52.9 kg) 110  lb 0.2 oz (49.9 kg)    PHYSICAL EXAM  General: Pleasant, NAD. Neuro: Alert and oriented X 3. Moves all extremities spontaneously. Psych: Normal affect. HEENT:  Normal  Neck: Supple without bruits or JVD. Lungs:  Resp regular and unlabored, CTA. Heart: RRR no s3, s4, or murmurs. Abdomen: Soft, non-tender, non-distended, BS + x 4.  Extremities: No clubbing, cyanosis or edema. DP/PT/Radials 2+ and equal bilaterally.  Accessory Clinical Findings  CBC  Recent Labs  04/28/14 2305 04/30/14 0655  WBC 6.8 6.2  HGB 9.1* 8.9*  HCT 27.4* 27.9*  MCV 91.3 92.1  PLT 127* 128*   Basic Metabolic Panel  Recent Labs  04/29/14 0646 04/30/14 0655  NA 137 140  K 3.4* 3.7  CL 111 113*  CO2 19 18*  GLUCOSE 196* 106*  BUN 30* 24*  CREATININE 2.90* 2.64*  CALCIUM 8.2* 8.9   Cardiac Enzymes  Recent Labs  04/28/14 2305 04/29/14 0646 04/29/14 1259  TROPONINI <0.03 <0.03 <0.03    TELE NSR    ECG No new  EKG   Echocardiogram 10/12/2011  LV EF: 55% -  60%  ------------------------------------------------------------ Indications:   Dyspnea 786.09. Palpitations 785.1.  ------------------------------------------------------------ History:  PMH: Elevated BNP Acquired from the patient and from the patient's chart. Chest pain. Palpitations, dyspnea, and dizziness. Poorly controlled hypertension. Risk factors: Hypertension. Diabetes mellitus. Dyslipidemia.  ------------------------------------------------------------ Study Conclusions  - Left ventricle: The cavity size was normal. There was mild focal basal hypertrophy of the septum. Systolic function was normal. The estimated ejection fraction was in the range of 55% to 60%. Wall motion was normal; there were no regional wall motion abnormalities. Features are consistent with a pseudonormal left ventricular filling pattern, with concomitant abnormal relaxation and increased filling pressure  (grade 2 diastolic dysfunction). - Mitral valve: Mildly calcified annulus. Normal thickness leaflets . Mild regurgitation. - Left atrium: The atrium was mildly dilated. - Pulmonary arteries: Systolic pressure was mildly increased. PA peak pressure: 34mm Hg (S).     Radiology/Studies  Ct Abdomen Pelvis Wo Contrast  04/20/2014   CLINICAL DATA:  Acute onset of upper abdominal pain and shortness of breath after falling off bed 2 weeks ago. Nausea and vomiting, acute onset. Initial encounter.  EXAM: CT ABDOMEN AND PELVIS WITHOUT CONTRAST  TECHNIQUE: Multidetector CT imaging of the abdomen and pelvis was performed following the standard protocol without IV contrast.  COMPARISON:  Renal ultrasound performed 03/25/2014, and CT of the abdomen and pelvis from 02/09/2004  FINDINGS: A trace right pleural effusion is noted, with mild focal right basilar airspace opacity, possibly reflecting atelectasis or mild pneumonia.  There is an unusual linear focus of hypoattenuation along the hepatic dome. This could be posttraumatic in nature, though somewhat unusual in appearance for blunt trauma. The spleen is grossly unremarkable.  The gallbladder is mildly distended. The common hepatic duct measures 1.7 cm in diameter, with associated intrahepatic biliary ductal dilatation. This raises concern for distal obstruction, though not well characterized on this study. The pancreas and adrenal glands are unremarkable.  Scattered small nonobstructing bilateral renal stones are seen. A 1.3 cm cyst is noted at the lower pole of the right kidney. The kidneys are otherwise grossly unremarkable. Nonspecific perinephric stranding is noted bilaterally. There is no evidence of hydronephrosis. No obstructing ureteral stones are seen.  No free fluid is identified. The small bowel is unremarkable in appearance. The stomach is within normal limits. No acute vascular abnormalities are seen. Relatively diffuse calcification is noted  along the abdominal aorta and its branches. Diffuse postoperative changes noted along the pelvic sidewalls bilaterally.  The appendix is difficult to fully characterize, but there is no evidence of appendicitis. Scattered diverticulosis is noted along the ascending colon. Contrast progresses to the level of the rectum. There is mild diffuse fatty infiltration along the wall of the ascending colon, raising concern for sequelae of chronic inflammation.  The bladder contains a small to moderate amount of air. Some of this air is seen along the posterior wall of the bladder, of uncertain significance. There is mild associated soft tissue stranding, and a small fistula from the sigmoid colon cannot be entirely excluded, though there is no evidence of contrast extending into the bladder. Would correlate for recent Foley catheter placement or findings to suggest fistula; emphysematous cystitis cannot be excluded. No inguinal lymphadenopathy is seen.  No acute osseous abnormalities are identified. There is chronic grade 1 anterolisthesis of L4 on L5. Multilevel vacuum phenomenon is noted along the lumbar spine.  IMPRESSION: 1. Linear focus of hypoattenuation along the hepatic dome could  be posttraumatic in nature, though somewhat unusual in appearance for blunt trauma. No significant subcapsular hematoma seen. No additional evidence for traumatic injury within the abdomen or pelvis. 2. Trace right pleural effusion, with mild focal right basilar airspace opacity, possibly reflecting atelectasis or mild pneumonia. 3. Small to moderate amount of air noted within the bladder. Would correlate for recent Foley catheter placement. Some of this air is noted along the posterior wall of the bladder, with mild associated soft tissue stranding. A small fistula from the sigmoid colon cannot be entirely excluded, though there is no evidence of contrast extending into the bladder. Would correlate for clinical findings to suggest fistula;  emphysematous cystitis cannot be excluded. 4. Dilatation of the common hepatic duct to 1.7 cm in maximal diameter, with intrahepatic biliary ductal dilatation and prominence of the gallbladder, new from the prior study. This raises concern for distal obstruction, not well characterized on the current study. MRCP or ERCP could be considered for further evaluation, as deemed clinically appropriate. 5. Scattered small nonobstructing bilateral renal stones seen. Small right renal cyst noted. 6. Relatively diffuse calcification along the abdominal aorta and its branches. 7. Scattered diverticulosis along the ascending colon, without evidence of diverticulitis. Mild diffuse fatty infiltration along the wall of the ascending colon raises concern for sequelae of chronic inflammation.   Electronically Signed   By: Roanna Raider M.D.   On: 04/20/2014 22:10   Dg Chest 2 View  04/28/2014   CLINICAL DATA:  Shortness of breath, centralized chest pain.  EXAM: CHEST  2 VIEW  COMPARISON:  04/23/2014  FINDINGS: Prior CABG. Heart is borderline in size. Hyperinflation of the lungs. Areas of scarring in the upper lobes and lung bases. No acute airspace opacities or effusions No acute bony abnormality.  IMPRESSION: COPD/chronic changes.  No active disease.   Electronically Signed   By: Charlett Nose M.D.   On: 04/28/2014 23:11   [place] Screening Aaa  04/22/2014   CLINICAL DATA:  Abdominal pain, 80 year old female, history of hypertension, hyperlipidemia, type 2 diabetes  EXAM: ABDOMINAL AORTA SCREENING ULTRASOUND  TECHNIQUE: Ultrasound examination of the abdominal aorta was performed as a screening evaluation for abdominal aortic aneurysm.  COMPARISON:  No similar prior exam is available at this institution for comparison or on [facility] PACS.  FINDINGS: Abdominal Aorta  No aneurysm identified.  Maximum AP  Diameter:  2.3 cm  Maximum TRV  Diameter: 2.4 cm  IMPRESSION: No sonographic evidence for abdominal aortic aneurysm.    Electronically Signed   By: Christiana Pellant M.D.   On: 04/22/2014 21:25   Dg Chest Port 1 View  04/23/2014   CLINICAL DATA:  Acute onset wheezing.  Fall 2 weeks prior  EXAM: PORTABLE CHEST - 1 VIEW  COMPARISON:  April 22, 2014  FINDINGS: There is underlying emphysematous change. There is a small right effusion. There is slight interstitial edema. No consolidation. Heart is borderline enlarged with pulmonary vascularity within normal limits. Patient is status post coronary artery bypass grafting. There is extensive atherosclerotic change in the aortic arch region. Bones are osteoporotic. There is degenerative change in both shoulders. No adenopathy.  IMPRESSION: Emphysematous change. There is mild edema with small effusion and mild cardiomegaly consistent with a degree of congestive heart failure superimposed on emphysematous change. No airspace consolidation.   Electronically Signed   By: Bretta Bang M.D.   On: 04/23/2014 13:56   Dg Abd Acute W/chest  04/22/2014   CLINICAL DATA:  Abdominal pain  EXAM: ACUTE  ABDOMEN SERIES (ABDOMEN 2 VIEW & CHEST 1 VIEW)  COMPARISON:  Chest radiograph January 15, 2012; CT abdomen and pelvis April 20, 2014  FINDINGS: PA chest: There is moderate interstitial edema. There is no appreciable airspace consolidation. The heart size is normal. The pulmonary vascularity is within normal limits. Patient is status post coronary artery bypass grafting.  Supine and upright abdomen: There is no appreciable bowel dilatation or air-fluid level. No free air. There are multiple surgical clips in the pelvis. There are scattered foci of contrast in the colon.  IMPRESSION: Overall bowel gas pattern unremarkable. Multiple surgical clips throughout the pelvis. There is moderate interstitial edema. No airspace consolidation.   Electronically Signed   By: Bretta BangWilliam  Woodruff M.D.   On: 04/22/2014 21:45    ASSESSMENT AND PLAN  1. Atypical chest pain  - aggressive medical therapy.  Pending echo to assess LV function  - poor candidate for invasive therapy with Cr close to 3   - per son, CP feels different compare to prior CABG, lasting 5-10 mins each time yesterday for 2-3 times, resolved since yesterday. Serial trop negative, EKG shows prior inferior infarct  - will followup on Echo result, if EF normal and no wall motion abnormality, likely can be discharged later today  2. CAD s/p CABG Apr 2008  - Cath Aug 2012 100% occluded prox LAD, 80% OM2 stenosis, PDA filled with L to R collaterals, occluded SVG to Diag, occluded SVG to OM2, patent LIMA to LAD  - Echo 10/2011 EF 55-60%, grade 2 diastolic dysfunction  3. CKD 4. HTN 5. HLD 6. DM 7. GERD  Signed, Azalee CourseMeng, Hao PA-C Pager: [phone]  I have seen and examined the patient along with Azalee CourseMeng, Hao PA-C.  I have reviewed the chart, notes and new data.  I agree with PA's note.  Unfortunately, Echo shows a substantial drop in LVEF compared to previous evaluation. EF now 40%. Known multivessel CAD and severe CKD.  Nuclear study to assess ischemia/viability in AM. Very difficult situation due to severe CKD.  Thurmon FairMihai Maniya Donovan, MD, [facility]FACC [facility] [phone] 04/30/2014, 12:49 PM

## 2014-04-30 NOTE — Progress Notes (Signed)
UR completed 

## 2014-04-30 NOTE — Progress Notes (Signed)
Internal Medicine Attending  Date: 04/30/2014  Patient name: Bianca BurnetJieh T Kimple Medical record number: [id number] Date of birth: [birth year] Age: 80 y.o. Gender: female  I saw and evaluated the patient. I discussed patient and reviewed the resident's note by Dr. Senaida Oresichardson, and I agree with the resident's findings and plans as documented in her note.

## 2014-04-30 NOTE — Discharge Summary (Signed)
Name: Bianca [name] MRN: [id number] DOB: 1930/05/27 80 y.o. PCP: Rich Numberarly Rivet, MD  Date of Admission: 04/28/2014 10:40 PM Date of Discharge: 05/01/2014 Attending Physician: Farley LyJerry Dale Joines, MD  Discharge Diagnosis:  Principal Problem:   GERD Active Problems:   Essential hypertension   Coronary atherosclerosis   Chronic renal failure, stage 4 (severe)   Type II diabetes mellitus   Normocytic anemia   Fistula, intestinovesical   Metabolic acidosis, normal anion gap (NAG)   Cardiomyopathy, ischemic   Chronic combined systolic and diastolic CHF (congestive heart failure)  Discharge Medications:   Medication List    TAKE these medications        acetaminophen 500 MG tablet  Commonly known as:  TYLENOL  Take 2 tablets (1,000 mg total) by mouth every 8 (eight) hours as needed for mild pain.     amLODipine 5 MG tablet  Commonly known as:  NORVASC  Take 1 tablet (5 mg total) by mouth daily.     amoxicillin-clavulanate 500-125 MG per tablet  Commonly known as:  AUGMENTIN  Take 1 tablet (500 mg total) by mouth 2 (two) times daily.     aspirin EC 81 MG tablet  TAKE 1 TABLET BY MOUTH EVERY DAY     B-D ULTRAFINE III SHORT PEN 31G X 8 MM Misc  Generic drug:  Insulin Pen Needle  USE AS DIRECTED TO INJECT INSULIN 3 TIMES A DAY     BAYER CONTOUR NEXT TEST test strip  Generic drug:  glucose blood  TEST BLOOD SUGAR UP TO 5 TIMES A DAY     calcitRIOL 0.25 MCG capsule  Commonly known as:  ROCALTROL  Take 2 capsules (0.5 mcg total) by mouth daily.     calcium citrate-vitamin D 315-200 MG-UNIT per tablet  Commonly known as:  CITRACAL+D  Take 2 tablets by mouth 2 (two) times daily.     carvedilol 25 MG tablet  Commonly known as:  COREG  TAKE 1 TABLET (25 MG TOTAL) BY MOUTH 2 (TWO) TIMES DAILY WITH A MEAL.     CRESTOR 20 MG tablet  Generic drug:  rosuvastatin  TAKE 1 TABLET (20 MG TOTAL) BY MOUTH DAILY.     ferrous sulfate 325 (65 FE) MG tablet  TAKE 1 TABLET BY MOUTH 3  TIMES DAILY WITH MEALS.     hydrALAZINE 25 MG tablet  Commonly known as:  APRESOLINE  Take 1 tablet (25 mg total) by mouth 3 (three) times daily.     isosorbide mononitrate 60 MG 24 hr tablet  Commonly known as:  IMDUR  Take 1 tablet (60 mg total) by mouth daily.     LANTUS 100 UNIT/ML injection  Generic drug:  insulin glargine  INJECT 0.2 MLS (20 UNITS TOTAL) INTO THE SKIN AT BEDTIME.     nitroGLYCERIN 0.4 MG SL tablet  Commonly known as:  NITROSTAT  Place 0.4 mg under the tongue every 5 (five) minutes as needed. For chest pain. Maximum of 3 doses     NOVOLOG FLEXPEN 100 UNIT/ML FlexPen  Generic drug:  insulin aspart  INJECT 8 UNITS INTO THE SKIN THREE TIMES DAILY BEFORE MEALS.     pantoprazole 40 MG tablet  Commonly known as:  PROTONIX  Take 1 tablet (40 mg total) by mouth 2 (two) times daily.     PROAIR HFA 108 (90 BASE) MCG/ACT inhaler  Generic drug:  albuterol  INHALE 1 PUFF EVERY 6 HOURS AS NEEDED FOR WHEEZE     sennosides-docusate sodium  8.6-50 MG tablet  Commonly known as:  SENOKOT-S  Take 1 tablet by mouth daily.     traMADol 50 MG tablet  Commonly known as:  ULTRAM  Take 1 tablet (50 mg total) by mouth every 6 (six) hours as needed for moderate pain.      ASK your doctor about these medications        docusate sodium 100 MG capsule  Commonly known as:  COLACE  Take 1 capsule (100 mg total) by mouth daily as needed for mild constipation.        Disposition and follow-up:   [name] was discharged from [facility] in Good condition.  At the hospital follow up visit please address:  1. GERD: Discharged on Protonix 40 mg BID for 4 weeks. Patient can likely be decreased to protonix 40 mg daily 4 weeks from now  (05/28/14) and have GERD symptoms reassessed.   Newly Diagnosed Systolic CHF with Chronic Diastolic CHF: Patient had myoview which showed large infarct but no reversible ischemia. Cardiology recommended medical management and  following up with Cardiology as outpatient.   Recurrent UTI: CT concerning for free air in bladder - enterovesicular fistula vs emphysematous cystitis. Urology was consulted last admission and recommended outpatient follow up for cystoscopy. Repeat UA during admission showed improvement of UTI. Patient will complete Augmentin 500-125mg  BID for 7 days on 05/01/14. I was not able to make an appointment with that clinic (no answer), please assess if patient has been able to make appointment with Dr Sherron Monday for cystoscopy. If not, please have the clinic assist with this.  CBD dilation: On CT abdomen last admission, 1.7 cm dilated, gallbladder prominence, and intrahepatic biliary duct dilation. GI determined this to be chronic and unlikely to be obstructed. GI recommended outpatient MRI/MRCP if clinically warranted.   Acute on Chronic normocytic anemia: baseline hgb around 10. Remained stable around 8 during hospitalization. EGD unremarkable. May need colonoscopy.  2.  Labs / imaging needed at time of follow-up: CBC (for hemoglobin), BMET (for Cr and K), Cystoscopy, MRCP (if clinically indicated), Colonoscopy, PFTs for COPD diagnosis  3.  Pending labs/ test needing follow-up: None  Follow-up Appointments: Follow-up Information    Follow up with MACDIARMID,SCOTT A, MD.   Specialty:  Urology   Why:  to make an appointment with Dr. Sherron Monday for hospital follow up for cystoscopy   Contact information:   [address] [place] [address] [phone]       Follow up with Dionne Ano, MD On 05/09/2014.   Specialty:  Internal Medicine   Why:  1:15 pm   Contact information:   [address] [place] [address] [phone]       Follow up with Jacolyn Reedy, PA-C On 05/13/2014.   Specialty:  Cardiology   Why:  10:45 am   Contact information:   [address] [place] [address] [phone]       Discharge Instructions: Discharge Instructions    Diet - low sodium  heart healthy    Complete by:  As directed      Increase activity slowly    Complete by:  As directed            Consultations: Treatment Team:  Rounding Lbcardiology, MD  Procedures Performed:  Ct Abdomen Pelvis Wo Contrast  04/20/2014   CLINICAL DATA:  Acute onset of upper abdominal pain and shortness of breath after falling off bed 2 weeks ago. Nausea  and vomiting, acute onset. Initial encounter.  EXAM: CT ABDOMEN AND PELVIS WITHOUT CONTRAST  TECHNIQUE: Multidetector CT imaging of the abdomen and pelvis was performed following the standard protocol without IV contrast.  COMPARISON:  Renal ultrasound performed 03/25/2014, and CT of the abdomen and pelvis from 02/09/2004  FINDINGS: A trace right pleural effusion is noted, with mild focal right basilar airspace opacity, possibly reflecting atelectasis or mild pneumonia.  There is an unusual linear focus of hypoattenuation along the hepatic dome. This could be posttraumatic in nature, though somewhat unusual in appearance for blunt trauma. The spleen is grossly unremarkable.  The gallbladder is mildly distended. The common hepatic duct measures 1.7 cm in diameter, with associated intrahepatic biliary ductal dilatation. This raises concern for distal obstruction, though not well characterized on this study. The pancreas and adrenal glands are unremarkable.  Scattered small nonobstructing bilateral renal stones are seen. A 1.3 cm cyst is noted at the lower pole of the right kidney. The kidneys are otherwise grossly unremarkable. Nonspecific perinephric stranding is noted bilaterally. There is no evidence of hydronephrosis. No obstructing ureteral stones are seen.  No free fluid is identified. The small bowel is unremarkable in appearance. The stomach is within normal limits. No acute vascular abnormalities are seen. Relatively diffuse calcification is noted along the abdominal aorta and its branches. Diffuse postoperative changes noted along the pelvic  sidewalls bilaterally.  The appendix is difficult to fully characterize, but there is no evidence of appendicitis. Scattered diverticulosis is noted along the ascending colon. Contrast progresses to the level of the rectum. There is mild diffuse fatty infiltration along the wall of the ascending colon, raising concern for sequelae of chronic inflammation.  The bladder contains a small to moderate amount of air. Some of this air is seen along the posterior wall of the bladder, of uncertain significance. There is mild associated soft tissue stranding, and a small fistula from the sigmoid colon cannot be entirely excluded, though there is no evidence of contrast extending into the bladder. Would correlate for recent Foley catheter placement or findings to suggest fistula; emphysematous cystitis cannot be excluded. No inguinal lymphadenopathy is seen.  No acute osseous abnormalities are identified. There is chronic grade 1 anterolisthesis of L4 on L5. Multilevel vacuum phenomenon is noted along the lumbar spine.  IMPRESSION: 1. Linear focus of hypoattenuation along the hepatic dome could be posttraumatic in nature, though somewhat unusual in appearance for blunt trauma. No significant subcapsular hematoma seen. No additional evidence for traumatic injury within the abdomen or pelvis. 2. Trace right pleural effusion, with mild focal right basilar airspace opacity, possibly reflecting atelectasis or mild pneumonia. 3. Small to moderate amount of air noted within the bladder. Would correlate for recent Foley catheter placement. Some of this air is noted along the posterior wall of the bladder, with mild associated soft tissue stranding. A small fistula from the sigmoid colon cannot be entirely excluded, though there is no evidence of contrast extending into the bladder. Would correlate for clinical findings to suggest fistula; emphysematous cystitis cannot be excluded. 4. Dilatation of the common hepatic duct to 1.7 cm in  maximal diameter, with intrahepatic biliary ductal dilatation and prominence of the gallbladder, new from the prior study. This raises concern for distal obstruction, not well characterized on the current study. MRCP or ERCP could be considered for further evaluation, as deemed clinically appropriate. 5. Scattered small nonobstructing bilateral renal stones seen. Small right renal cyst noted. 6. Relatively diffuse calcification along the abdominal aorta  and its branches. 7. Scattered diverticulosis along the ascending colon, without evidence of diverticulitis. Mild diffuse fatty infiltration along the wall of the ascending colon raises concern for sequelae of chronic inflammation.   Electronically Signed   By: Roanna Raider M.D.   On: 04/20/2014 22:10   Dg Chest 2 View  04/28/2014   CLINICAL DATA:  Shortness of breath, centralized chest pain.  EXAM: CHEST  2 VIEW  COMPARISON:  04/23/2014  FINDINGS: Prior CABG. Heart is borderline in size. Hyperinflation of the lungs. Areas of scarring in the upper lobes and lung bases. No acute airspace opacities or effusions No acute bony abnormality.  IMPRESSION: COPD/chronic changes.  No active disease.   Electronically Signed   By: Charlett Nose M.D.   On: 04/28/2014 23:11   Nm Myocar Multi W/spect W/wall Motion / Ef  05/01/2014   CLINICAL DATA:  Unstable angina.  myocardial ischemia.  EXAM: MYOCARDIAL IMAGING WITH SPECT (REST AND PHARMACOLOGIC-STRESS)  GATED LEFT VENTRICULAR WALL MOTION STUDY  LEFT VENTRICULAR EJECTION FRACTION  TECHNIQUE: Standard myocardial SPECT imaging was performed after resting intravenous injection of 10 mCi Tc-73m sestamibi. Subsequently, intravenous infusion of Lexiscan was performed under the supervision of the Cardiology staff. At peak effect of the drug, 30 mCi Tc-46m sestamibi was injected intravenously and standard myocardial SPECT imaging was performed. Quantitative gated imaging was also performed to evaluate left ventricular wall  motion, and estimate left ventricular ejection fraction.  COMPARISON:  None.  FINDINGS: Perfusion: There is an infarct over the mid inferior wall. This is a moderate-sized defect. No inducible ischemia.  Wall Motion: Dyskinesia in the inferior wall at the area of infarction. There is decreased wall thickening and some paradoxical motion in the mid inferior wall. Otherwise wall motion is normal.  Left Ventricular Ejection Fraction: 74%  End diastolic volume 47 ml  End systolic volume 12 ml  IMPRESSION: 1. Moderate-sized infarct in the mid inferior wall. No areas of reversible ischemia.  2. Dyskinesia in the moderate infarct.  Otherwise normal.  3. Left ventricular ejection fraction 74%  4. Low to intermediate-risk stress test findings*.  *2012 Appropriate Use Criteria for Coronary Revascularization Focused Update: J Am Coll Cardiol. 2012;59(9):857-881. http://content.dementiazones.com.aspx?articleid=1201161   Electronically Signed   By: Andreas Newport M.D.   On: 05/01/2014 14:45   [place] Screening Aaa  04/22/2014   CLINICAL DATA:  Abdominal pain, 80 year old female, history of hypertension, hyperlipidemia, type 2 diabetes  EXAM: ABDOMINAL AORTA SCREENING ULTRASOUND  TECHNIQUE: Ultrasound examination of the abdominal aorta was performed as a screening evaluation for abdominal aortic aneurysm.  COMPARISON:  No similar prior exam is available at this institution for comparison or on [facility] PACS.  FINDINGS: Abdominal Aorta  No aneurysm identified.  Maximum AP  Diameter:  2.3 cm  Maximum TRV  Diameter: 2.4 cm  IMPRESSION: No sonographic evidence for abdominal aortic aneurysm.   Electronically Signed   By: Christiana Pellant M.D.   On: 04/22/2014 21:25   Dg Chest Port 1 View  04/23/2014   CLINICAL DATA:  Acute onset wheezing.  Fall 2 weeks prior  EXAM: PORTABLE CHEST - 1 VIEW  COMPARISON:  April 22, 2014  FINDINGS: There is underlying emphysematous change. There is a small right effusion. There is slight  interstitial edema. No consolidation. Heart is borderline enlarged with pulmonary vascularity within normal limits. Patient is status post coronary artery bypass grafting. There is extensive atherosclerotic change in the aortic arch region. Bones are osteoporotic. There is degenerative change in both shoulders.  No adenopathy.  IMPRESSION: Emphysematous change. There is mild edema with small effusion and mild cardiomegaly consistent with a degree of congestive heart failure superimposed on emphysematous change. No airspace consolidation.   Electronically Signed   By: Bretta Bang M.D.   On: 04/23/2014 13:56   Dg Abd Acute W/chest  04/22/2014   CLINICAL DATA:  Abdominal pain  EXAM: ACUTE ABDOMEN SERIES (ABDOMEN 2 VIEW & CHEST 1 VIEW)  COMPARISON:  Chest radiograph January 15, 2012; CT abdomen and pelvis April 20, 2014  FINDINGS: PA chest: There is moderate interstitial edema. There is no appreciable airspace consolidation. The heart size is normal. The pulmonary vascularity is within normal limits. Patient is status post coronary artery bypass grafting.  Supine and upright abdomen: There is no appreciable bowel dilatation or air-fluid level. No free air. There are multiple surgical clips in the pelvis. There are scattered foci of contrast in the colon.  IMPRESSION: Overall bowel gas pattern unremarkable. Multiple surgical clips throughout the pelvis. There is moderate interstitial edema. No airspace consolidation.   Electronically Signed   By: Bretta Bang M.D.   On: 04/22/2014 21:45   Admission HPI: 80 yo female with CAD s/p cabg, HTN, HLD, recurrent UTI's recently admitted for UTI, here with SOB, chest tightness and worsening of LBP. She was doing well when she was discharged on 04/24/14. Today around 6 pm she started feeling SOB and chest tightness, happened all the sudden. Had some wheezing per daughter, she also had some wheezing last admission. She has some cough and pain caused by coughing.  Otherwise she just feels tight in her chest but no pain with deep inspiration. She felt her chest tightness/sob improved somewhat with her inhaler at home.   She also having some burning with urination and feels like bubbles are in the urine. Has some back pain, more than baseline, on her lower back. Has baseline numbness on her foot but no changes recently. Had increased swelling on her legs. No calf tenderness. Not sure if she had blood clot in the past. Denies fever/chills, n/v.   ED provider concerned for PE, started heparin drip. No d-dimer or VQ scan was ordered.   CXR suggestive of copd but never diagnosed formally, no previous PFT's.   Hospital Course by problem list: Principal Problem:   GERD Active Problems:   Essential hypertension   Coronary atherosclerosis   Chronic renal failure, stage 4 (severe)   Type II diabetes mellitus   Normocytic anemia   Fistula, intestinovesical   Metabolic acidosis, normal anion gap (NAG)   Cardiomyopathy, ischemic   Chronic combined systolic and diastolic CHF (congestive heart failure)   GERD: Patient was admitted with shortness of breath and chest tightness. She was presumed to have a PE in the ED and was started on heparin. However, after talking with the patient using a translator for her symptoms, symptoms were not consistent with PE and patient had a modified geneva low-moderate. Heparin gtt was discontinued. Patient did not have lower extremity edema or calf tenderness. Chest pain was not reproducible with palpation, lungs clear to ausculation bilaterally, no evidence of wheezes, rhonchi or rales. Her vital signs were stable and she had non-specific EKG changes, troponins negative x 3, CXR showed COPD and chronic changes. Patient does have a significant cardiac history with CABG in 2008, repeat cath in 2012. However, her symptoms sounded more consistent with GERD symptoms. Recent EGD on 04/22/14 was normal. Patient was seen by Cardiology who  doubted Cardiac cause  as patient was ruled out for MI with 3 negative troponins and non-specific changes on EKG. Doubt CHF and COPD exacerbations given lack of acute findings on CXR, BNP 539, euvolemia, and lack of productive cough and good sats on room air. Cardiology recommended increasing Imdur to 60 mg, continuing coreg and checking a repeat echocardiogram. Upon discussion with the patient, it was clear that her symptoms are related to uncontrolled reflux as she describes it as typical of her indigestion. It is unclear if the patient had been taking her protonix. She has not had any recurrent symptoms since the day prior. We increased her PPI to BID and she has follow up in our clinic the day after discharge. Upon discussion with Cardiology, patient will have repeat echo to assess baseline. 2D Echo showed progression of CHF with EF of 40-45%. Patient was discharged on protonix 40 mg BID. This can be decreased in about 4 weeks to daily and symptoms can be reassessed.   Hypokalemia: Patient had intermittent hypokalemia down to 3.5 which resolved to 3.7. This could be secondary to duoneb treatments. Consider repeat [facility].   History of Recurrent UTI: Patient has a history of recurrent UTI. She was recently admitted with E. Coli UTI on 04/24/14 with 7 days of Augmentin. Patient complains of dysuria and pneumaturia. Patient was afebrile and without leukocytosis during admission. UA showed proteinuria, but negative nitrites, trace leukocytes, negative hemoglobin, few bacteria, which is improved from prior. CT abdomen on previous admission showed small to moderate amount of air noted along the posterior wall of the bladder, with mild associated soft tissue stranding. A small fistula from the sigmoid colon could not be entirely excluded, though there is no evidence of contrast extending into the bladder. DDx includes fistula versus emphysematous cystitis. No recent history of foley. GI and  Urology doubted sigmoido-vesicular fistula and recommend outpatient cystoscopy. Patient was discharged with 1.5 more days of Augmentin for her UTI (Augmentin 500-125 mg BID for 2 more days, End 05/01/14). I tried several times to schedule the patient an appointment with Dr. Sherron MondayMacDiarmid, Urologist, who saw the patient during the last admission, but I was never able to get through. I gave the patient and family the office contact number, but if they are unsuccessful, this should be set up through our clinic or patient should see a different urologist.  Spinal Stenosis: S/p central and lateral decmpression L3-4, L4-5, L5-S1 with chronic pain at baseline. Patient takes tramadol at home. We continued her Tramadol 50 mg Q6H prn during admission and on discharge.   Chronic Grade 2 Diastolic CHF: Last ECHO 2013 EF 55-60%, grade 2 diastolic dysfunction. Patient has trace edema in ankles bilaterally, but patient was not in an exacerbation as she was euvolemic and lungs weere CTA b/l. CXR showed no acute changes. BNP elevated at 539.4. Cardiology recommended repeating echocardiogram for new baseline.  2D echo showed  - Left ventricle: The cavity size was normal. Wall thickness was increased in a pattern of mild LVH. There was mild focal basal hypertrophy of the septum. Systolic function was mildly to moderately reduced. The estimated ejection fraction was in the range of 40% to 45%. Regional wall motion abnormalities cannot be excluded. Doppler parameters are consistent with high ventricular filling pressure. - Mitral valve: Calcified annulus. There was mild regurgitation. - Left atrium: The atrium was mildly dilated. - Tricuspid valve: There was moderate regurgitation. - Pulmonary arteries: PA peak pressure: 39 mm Hg (S). Cardiology recommended myoview.  Patient's myoview showed a dense inferior wall scar, but no other areas of significant regional ischemia. No plan for cardiac cath at this time.  Global LV ischemia is a possibility, but she is not a good surgical candidate. Patient has severe ischemic cardiomyopathy with new mild LV systolic dysfunction in a patient with severe chronic kidney disease. Dr. Royann Shivers recommends continuing BB and nitrates with current medical regimen as patient cannot have ACEI or ranolazine. Patient is okay to discharge and will follow up with PCP on 05/09/14 and Cardiology on 05/13/14.   Chronic Non-Anion Gap Metabolic Acidosis: Low bicarb seems to be new since October 2015. Anion gap 8, bicarb 18>19>18 during admission. Differential includes RTA, early renal failure with impaired generation of ammonia and also sigmoido-vesicular fistula. Patient denies any diarrhea. Patient should be schedule for a Cystoscopy outpatient  CKD stage II: Baseline creatinine ~2.5-2.9, but 3.27 on admission secondary to recent lasix. This trended down from 3.27>2.9>2.64. Consider follow-up BMET as outpatient during appointment on 05/09/14.  COPD: COPD changes seen on CXR, uses proair at home for SOB/wheezing/chest tightness, but never formally diagnosed and no recent PFT's on chart. Lungs were CTA b/l. Consider outpatient PFTs.  Chronic Normocytic Anemia: Likely 2/2 to CKD. Baseline hgb around 10, 9.1 on admission> 8.9, stable. EGD last admission normal. On po iron at home.  CBD Dilation: CT abdomen during last admission showed 1.7 cm dilated, gallblader prominence and intrahepatic biliary duct dilation. GI determined this to be chronic and unlikely to be obstructed. GI recommended outpatient MRI/MRCP if clinically warranted.   CAD s/p CABG: Patient is on ASA, coreg, and Imdur at home. Cardiology increased Imdur to 60 mg daily. Patient's myoview showed a dense inferior wall scar, but no other areas of significant regional ischemia. No plan for cardiac cath at this time. Global LV ischemia is a possibility, but she is not a good surgical candidate. Patient has severe ischemic cardiomyopathy  with new mild LV systolic dysfunction in a patient with severe chronic kidney disease. Dr. Royann Shivers recommends continuing BB and nitrates with current medical regimen as patient cannot have ACEI or ranolazine. Patient is okay to discharge and will follow up with PCP on 05/09/14 and Cardiology on 05/13/14. We continue ASA 81 mg daily and Coreg 25 mg BID during admission and on discharge.  DM II: Patient takes novolog 8 units tid and lantus 20 units qhs at home. HgbA1c 8.1 on 04/21/2014. Patient was managed with SSI, meal time and QHS correction, Lantus 20 units and Novolog 4 units TID WC during admission and discharged on her home regimen.  HTN: BP was mildly elevated secondary to missed medications yesterday in transition of care from ED to Floor. Patient takes norvasc 5 mg, coreg 25mg  BID, hydralazine 25mg  TID, and imdur 30mg  daily at home. Patient's Imdur was increased from 30 to 60 mg daily, but the rest of her medications were continued from previous.  Thrombocytopenia: Baseline 110-130's. 127 on admission> 128 today, stable. Hx of Hep B. LFT's recently normal.  Discharge Vitals:   BP 143/65 mmHg  Pulse 90  Temp(Src) 98.1 F (36.7 C) (Oral)  Resp 18  Ht 5' (1.524 m)  Wt 48.671 kg (107 lb 4.8 oz)  BMI 20.96 kg/m2  SpO2 100%  Discharge Labs:  Results for orders placed or performed during the hospital encounter of 04/28/14 (from the past 24 hour(s))  Glucose, capillary     Status: Abnormal   Collection Time: 04/30/14  9:50 PM  Result Value Ref Range  Glucose-Capillary 110 (H) 70 - 99 mg/dL   Comment 1 Notify RN   CBC     Status: Abnormal   Collection Time: 05/01/14  5:00 AM  Result Value Ref Range   WBC 6.2 4.0 - 10.5 K/uL   RBC 2.71 (L) 3.87 - 5.11 MIL/uL   Hemoglobin 8.1 (L) 12.0 - 15.0 g/dL   HCT 16.1 (L) 09.6 - 04.5 %   MCV 92.6 78.0 - 100.0 fL   MCH 29.9 26.0 - 34.0 pg   MCHC 32.3 30.0 - 36.0 g/dL   RDW 40.9 81.1 - 91.4 %   Platelets 111 (L) 150 - 400 K/uL  Glucose,  capillary     Status: Abnormal   Collection Time: 05/01/14  6:19 AM  Result Value Ref Range   Glucose-Capillary 147 (H) 70 - 99 mg/dL  Basic metabolic panel     Status: Abnormal   Collection Time: 05/01/14  9:18 AM  Result Value Ref Range   Sodium 139 135 - 145 mmol/L   Potassium 3.9 3.5 - 5.1 mmol/L   Chloride 110 96 - 112 mEq/L   CO2 21 19 - 32 mmol/L   Glucose, Bld 150 (H) 70 - 99 mg/dL   BUN 27 (H) 6 - 23 mg/dL   Creatinine, Ser 7.82 (H) 0.50 - 1.10 mg/dL   Calcium 9.5 8.4 - 95.6 mg/dL   GFR calc non Af Amer 16 (L) >90 mL/min   GFR calc Af Amer 18 (L) >90 mL/min   Anion gap 8 5 - 15  Glucose, capillary     Status: Abnormal   Collection Time: 05/01/14 11:09 AM  Result Value Ref Range   Glucose-Capillary 131 (H) 70 - 99 mg/dL   Comment 1 Notify RN     Signed: Jill Alexanders, DO PGY-1 Internal Medicine Resident Pager # [phone] 05/01/2014 4:09 PM

## 2014-04-30 NOTE — Plan of Care (Signed)
Problem: Phase I Progression Outcomes Goal: Anginal pain relieved Outcome: Adequate for Discharge Patient chest pain has resolved.

## 2014-04-30 NOTE — Progress Notes (Signed)
Pt. Ambulated this am from the room to the nurses station and and back O2 sats. Remains 99-100% HR-80's. Back to bed, pt. verbalized some short of breath after ambulating. Pt. Appears comfrotable  at present.

## 2014-05-01 ENCOUNTER — Observation Stay (HOSPITAL_COMMUNITY): Payer: Medicare Other

## 2014-05-01 ENCOUNTER — Ambulatory Visit: Payer: Medicare Other | Admitting: Internal Medicine

## 2014-05-01 DIAGNOSIS — K219 Gastro-esophageal reflux disease without esophagitis: Secondary | ICD-10-CM | POA: Diagnosis not present

## 2014-05-01 DIAGNOSIS — I200 Unstable angina: Secondary | ICD-10-CM

## 2014-05-01 DIAGNOSIS — I5042 Chronic combined systolic (congestive) and diastolic (congestive) heart failure: Secondary | ICD-10-CM

## 2014-05-01 LAB — BASIC METABOLIC PANEL
Anion gap: 8 (ref 5–15)
BUN: 27 mg/dL — ABNORMAL HIGH (ref 6–23)
CALCIUM: 9.5 mg/dL (ref 8.4–10.5)
CO2: 21 mmol/L (ref 19–32)
CREATININE: 2.68 mg/dL — AB (ref 0.50–1.10)
Chloride: 110 mEq/L (ref 96–112)
GFR, EST AFRICAN AMERICAN: 18 mL/min — AB (ref >90)
GFR, EST NON AFRICAN AMERICAN: 16 mL/min — AB (ref >90)
GLUCOSE: 150 mg/dL — AB (ref 70–99)
Potassium: 3.9 mmol/L (ref 3.5–5.1)
Sodium: 139 mmol/L (ref 135–145)

## 2014-05-01 LAB — CBC
HCT: 25.1 % — ABNORMAL LOW (ref 36.0–46.0)
HEMOGLOBIN: 8.1 g/dL — AB (ref 12.0–15.0)
MCH: 29.9 pg (ref 26.0–34.0)
MCHC: 32.3 g/dL (ref 30.0–36.0)
MCV: 92.6 fL (ref 78.0–100.0)
Platelets: 111 10*3/uL — ABNORMAL LOW (ref 150–400)
RBC: 2.71 MIL/uL — ABNORMAL LOW (ref 3.87–5.11)
RDW: 14.4 % (ref 11.5–15.5)
WBC: 6.2 10*3/uL (ref 4.0–10.5)

## 2014-05-01 LAB — GLUCOSE, CAPILLARY
Glucose-Capillary: 147 mg/dL — ABNORMAL HIGH (ref 70–99)
Glucose-Capillary: 131 mg/dL — ABNORMAL HIGH (ref 70–99)
Glucose-Capillary: 244 mg/dL — ABNORMAL HIGH (ref 70–99)

## 2014-05-01 MED ORDER — REGADENOSON 0.4 MG/5ML IV SOLN
INTRAVENOUS | Status: AC
  Administered 2014-05-01: 0.4 mg via INTRAVENOUS
  Filled 2014-05-01: qty 5

## 2014-05-01 MED ORDER — TECHNETIUM TC 99M SESTAMIBI GENERIC - CARDIOLITE
10.0000 | Freq: Once | INTRAVENOUS | Status: AC | PRN
  Administered 2014-05-01: 10 via INTRAVENOUS

## 2014-05-01 MED ORDER — TECHNETIUM TC 99M SESTAMIBI GENERIC - CARDIOLITE
30.0000 | Freq: Once | INTRAVENOUS | Status: AC | PRN
  Administered 2014-05-01: 30 via INTRAVENOUS

## 2014-05-01 MED ORDER — REGADENOSON 0.4 MG/5ML IV SOLN
0.4000 mg | Freq: Once | INTRAVENOUS | Status: AC
  Administered 2014-05-01: 0.4 mg via INTRAVENOUS
  Filled 2014-05-01: qty 5

## 2014-05-01 NOTE — Progress Notes (Signed)
Internal Medicine Attending  Date: 05/01/2014  Patient name: Bianca BurnetJieh T Bilton Medical record number: [id number] Date of birth: [birth year] Age: 80 y.o. Gender: female  I saw and evaluated the patient. I discussed patient and reviewed the resident's note by Dr. Senaida Oresichardson, and I agree with the resident's findings and plans as documented in her note, including plans to discharge home today as per cardiology recommendations.

## 2014-05-01 NOTE — Progress Notes (Signed)
Subjective: Denies CP currently.   Objective: Vital signs in last 24 hours: Temp:  [98.1 F (36.7 C)-98.6 F (37 C)] 98.1 F (36.7 C) (12/30 0526) Pulse Rate:  [74-100] 90 (12/30 1306) Resp:  [18] 18 (12/30 0526) BP: (127-160)/(64-75) 143/65 mmHg (12/30 1306) SpO2:  [100 %] 100 % (12/30 0526) Weight:  [107 lb 4.8 oz (48.671 kg)] 107 lb 4.8 oz (48.671 kg) (12/30 0526) Last BM Date: 04/30/14  Intake/Output from previous day:   Intake/Output this shift:    Medications Current Facility-Administered Medications  Medication Dose Route Frequency Provider Last Rate Last Dose  . 0.9 %  sodium chloride infusion   Intravenous Once Suzi RootsKevin E Steinl, MD      . amLODipine (NORVASC) tablet 5 mg  5 mg Oral Daily Gust RungErik C Hoffman, DO   5 mg at 05/01/14 1433  . amoxicillin-clavulanate (AUGMENTIN) 500-125 MG per tablet 500 mg  1 tablet Oral BID Gust RungErik C Hoffman, DO   500 mg at 05/01/14 1433  . aspirin EC tablet 81 mg  81 mg Oral Daily Gust RungErik C Hoffman, DO   81 mg at 05/01/14 1432  . calcitRIOL (ROCALTROL) capsule 0.5 mcg  0.5 mcg Oral Daily Gust RungErik C Hoffman, DO   0.5 mcg at 05/01/14 1432  . calcium-vitamin D (OSCAL WITH D) 500-200 MG-UNIT per tablet 1 tablet  1 tablet Oral BID WC Farley LyJerry Dale Joines, MD   1 tablet at 05/01/14 0755  . carvedilol (COREG) tablet 25 mg  25 mg Oral BID WC Gust RungErik C Hoffman, DO   25 mg at 05/01/14 0844  . gi cocktail (Maalox,Lidocaine,Donnatal)  30 mL Oral Once Jill AlexandersAlexa Richardson, MD   30 mL at 04/29/14 1115  . heparin injection 5,000 Units  5,000 Units Subcutaneous 3 times per day Farley LyJerry Dale Joines, MD   5,000 Units at 05/01/14 [id number]  . hydrALAZINE (APRESOLINE) tablet 25 mg  25 mg Oral TID Gust RungErik C Hoffman, DO   25 mg at 05/01/14 1432  . insulin aspart (novoLOG) injection 0-15 Units  0-15 Units Subcutaneous TID WC Gust RungErik C Hoffman, DO   3 Units at 04/30/14 1209  . insulin aspart (novoLOG) injection 0-5 Units  0-5 Units Subcutaneous QHS Gust RungErik C Hoffman, DO   4 Units at 04/30/14 1910  .  insulin aspart (novoLOG) injection 4 Units  4 Units Subcutaneous TID WC Gust RungErik C Hoffman, DO   4 Units at 04/30/14 1908  . insulin glargine (LANTUS) injection 20 Units  20 Units Subcutaneous QHS Gust RungErik C Hoffman, DO   20 Units at 04/30/14 2250  . ipratropium-albuterol (DUONEB) 0.5-2.5 (3) MG/3ML nebulizer solution 3 mL  3 mL Nebulization Q6H PRN Farley LyJerry Dale Joines, MD      . isosorbide mononitrate (IMDUR) 24 hr tablet 60 mg  60 mg Oral Daily Dwana MelenaBryan W Hager, PA-C   60 mg at 05/01/14 1433  . pantoprazole (PROTONIX) EC tablet 40 mg  40 mg Oral BID Marrian SalvageJacquelyn S Gill, MD   40 mg at 05/01/14 1431  . potassium chloride SA (K-DUR,KLOR-CON) CR tablet 40 mEq  40 mEq Oral Once Jill AlexandersAlexa Richardson, MD   40 mEq at 04/29/14 1100  . rosuvastatin (CRESTOR) tablet 20 mg  20 mg Oral Daily Gust RungErik C Hoffman, DO   20 mg at 05/01/14 1434  . sodium chloride 0.9 % injection 3 mL  3 mL Intravenous Q12H Gust RungErik C Hoffman, DO   3 mL at 04/30/14 2302  . traMADol (ULTRAM) tablet 50 mg  50 mg Oral Q6H PRN  Gust Rung, DO        PE: General appearance: alert, cooperative and no distress Neck: no carotid bruit and no JVD Lungs: clear to auscultation bilaterally Heart: regular rate and rhythm, S1, S2 normal, no murmur, click, rub or gallop Extremities: no LEE Pulses: 2+ and symmetric Skin: warm and dry Neurologic: Grossly normal  Lab Results:   Recent Labs  04/28/14 2305 04/30/14 0655 05/01/14 0500  WBC 6.8 6.2 6.2  HGB 9.1* 8.9* 8.1*  HCT 27.4* 27.9* 25.1*  PLT 127* 128* 111*   BMET  Recent Labs  04/29/14 0646 04/30/14 0655 05/01/14 0918  NA 137 140 139  K 3.4* 3.7 3.9  CL 111 113* 110  CO2 19 18* 21  GLUCOSE 196* 106* 150*  BUN 30* 24* 27*  CREATININE 2.90* 2.64* 2.68*  CALCIUM 8.2* 8.9 9.5   Cardiac Panel (last 3 results)  Recent Labs  04/28/14 2305 04/29/14 0646 04/29/14 1259  TROPONINI <0.03 <0.03 <0.03    Studies/Results: NST 05/01/14 FINDINGS: Perfusion: There is an infarct over the mid  inferior wall. This is a moderate-sized defect. No inducible ischemia.  Wall Motion: Dyskinesia in the inferior wall at the area of infarction. There is decreased wall thickening and some paradoxical motion in the mid inferior wall. Otherwise wall motion is normal.  Left Ventricular Ejection Fraction: 74%  End diastolic volume 47 ml  End systolic volume 12 ml  IMPRESSION: 1. Moderate-sized infarct in the mid inferior wall. No areas of reversible ischemia.  2. Dyskinesia in the moderate infarct. Otherwise normal.  3. Left ventricular ejection fraction 74%  4. Low to intermediate-risk stress test findings*.   Assessment/Plan    Principal Problem:   GERD Active Problems:   Essential hypertension   Coronary atherosclerosis   Chronic renal failure, stage 4 (severe)   Type II diabetes mellitus   Back pain   Normocytic anemia   Fistula, intestinovesical   Diastolic dysfunction   Metabolic acidosis, normal anion gap (NAG)   Unstable angina   Cardiomyopathy, ischemic   Pain in the chest   1. Atypical chest pain - cardiac enzymes negative x 3. CP reported to be different from pre CABG angina. However she has had a drop in EF from 55-60% in 2013 down to 40-45%.NST completed today: Perfusion: There is an infarct over the mid inferior wall. This is a moderate-sized defect. No inducible ischemia. Wall Motion: Dyskinesia in the inferior wall at the area of infarction. There is decreased wall thickening and some paradoxical motion in the mid inferior wall. Otherwise wall motion is normal.  Interpreted as low-intermediate risk study. Will have MD to review.     LOS: 3 days    Brittainy M. Sharol Harness, PA-C 05/01/2014 3:01 PM  I have seen and examined the patient along with Brittainy M. Sharol Harness, PA-C.  I have reviewed the chart, notes and new data.  I agree with PA's note.  Key new complaints: no angina (at least not at rest) Key examination changes: no  clinical CHF Key new findings / data: nuclear study confirms a dense inferior wall scar, but no other areas of significant regional ischemia. No plan for cardiac cath at this time. Global LV ischemia is a possibility,but she is not a good surgical candidate.  PLAN: Severe ischemic cardiomyopathy with new mild LV systolic dysfunction in a patient with severe chronic kidney disease.  Now asymptomatic on high dose beta blocker and increased dose nitrates. ACEi are not an option, neither is ranolazine.  There is a little room to increase amlodipine in the future if angina recurs. OK for DC from cardiac point of view.  Thurmon FairMihai Babbette Dalesandro, MD, [facility] [phone] 05/01/2014, 3:49 PM

## 2014-05-01 NOTE — Progress Notes (Addendum)
Subjective:  Patient was seen and examined this morning. Patient denies any complaint but she is hungry since she has eaten since yesterday since she has been NPO for Myoview. She denies any recurrent indigestion, chest pain, diaphoresis, nausea or shortness of breath.  Objective: Vital signs in last 24 hours: Filed Vitals:   04/30/14 0645 04/30/14 1343 04/30/14 2126 05/01/14 0526  BP: 160/81 149/70 127/65 141/64  Pulse: 80 78 74 77  Temp:  98.2 F (36.8 C) 98.6 F (37 C) 98.1 F (36.7 C)  TempSrc:  Oral Oral Oral  Resp:  16 18 18   Height:      Weight:    48.671 kg (107 lb 4.8 oz)  SpO2:  100% 100% 100%   Weight change: -4.229 kg (-9 lb 5.2 oz)  Intake/Output Summary (Last 24 hours) at 05/01/14 1223 Last data filed at 05/01/14 0900  Gross per 24 hour  Intake      0 ml  Output      0 ml  Net      0 ml    General: Vital signs reviewed.  Patient is well-developed and well-nourished, in no acute distress and cooperative with exam.  Cardiovascular: RRR, S1 normal, S2 normal, no murmurs, gallops, or rubs. No reproducible chest pain with palpation. Pulmonary/Chest: Clear to auscultation bilaterally, no wheezes, rales, or rhonchi. Abdominal: Soft, non-tender, non-distended, BS + Extremities: No lower extremity bilaterally.  Skin: Moderate ecchymosis from injections along lower abdomen, non-tender.  Psychiatric: Normal mood and affect. Speech and behavior is normal. Cognition and memory are normal.   Lab Results: Basic Metabolic Panel:  Recent Labs Lab 04/30/14 0655 05/01/14 0918  NA 140 139  K 3.7 3.9  CL 113* 110  CO2 18* 21  GLUCOSE 106* 150*  BUN 24* 27*  CREATININE 2.64* 2.68*  CALCIUM 8.9 9.5   CBC:  Recent Labs Lab 04/30/14 0655 05/01/14 0500  WBC 6.2 6.2  HGB 8.9* 8.1*  HCT 27.9* 25.1*  MCV 92.1 92.6  PLT 128* 111*   Cardiac Enzymes:  Recent Labs Lab 04/28/14 2305 04/29/14 0646 04/29/14 1259  TROPONINI <0.03 <0.03 <0.03   CBG:  Recent  Labs Lab 04/30/14 0651 04/30/14 1143 04/30/14 1600 04/30/14 2150 05/01/14 0619 05/01/14 1109  GLUCAP 102* 161* 194* 110* 147* 131*   Urinalysis:  Recent Labs Lab 04/29/14 0153  COLORURINE YELLOW  LABSPEC 1.011  PHURINE 7.0  GLUCOSEU 500*  HGBUR NEGATIVE  BILIRUBINUR NEGATIVE  KETONESUR NEGATIVE  PROTEINUR >300*  UROBILINOGEN 0.2  NITRITE NEGATIVE  LEUKOCYTESUR TRACE*   Micro Results: Recent Results (from the past 240 hour(s))  Urine culture     Status: None   Collection Time: 04/29/14  1:53 AM  Result Value Ref Range Status   Specimen Description URINE, CLEAN CATCH  Final   Special Requests NONE  Final   Colony Count   Final    15,000 COLONIES/ML Performed at [facility]    Culture   Final    Multiple bacterial morphotypes present, none predominant. Suggest appropriate recollection if clinically indicated. Performed at [facility]    Report Status 04/30/2014 FINAL  Final   Studies/Results: No results found. Medications: I have reviewed the patient's current medications. Prior to Admission:  Prescriptions prior to admission  Medication Sig Dispense Refill Last Dose  . acetaminophen (TYLENOL) 500 MG tablet Take 2 tablets (1,000 mg total) by mouth every 8 (eight) hours as needed for mild pain. 100 tablet 2 unknown  . amLODipine (NORVASC)  5 MG tablet Take 1 tablet (5 mg total) by mouth daily. 30 tablet 11 04/28/2014 at Unknown time  . aspirin EC 81 MG tablet TAKE 1 TABLET BY MOUTH EVERY DAY 150 tablet 1 04/28/2014 at Unknown time  . B-D ULTRAFINE III SHORT PEN 31G X 8 MM MISC USE AS DIRECTED TO INJECT INSULIN 3 TIMES A DAY 100 each 6 04/28/2014 at Unknown time  . BAYER CONTOUR NEXT TEST test strip TEST BLOOD SUGAR UP TO 5 TIMES A DAY 100 each 11 04/28/2014 at Unknown time  . carvedilol (COREG) 25 MG tablet TAKE 1 TABLET (25 MG TOTAL) BY MOUTH 2 (TWO) TIMES DAILY WITH A MEAL. 120 tablet 3 04/28/2014 at 1800  . CRESTOR 20 MG tablet TAKE 1 TABLET (20  MG TOTAL) BY MOUTH DAILY. 90 tablet 3 04/28/2014 at Unknown time  . docusate sodium (COLACE) 100 MG capsule Take 1 capsule (100 mg total) by mouth daily as needed for mild constipation. 30 capsule 1 unknown  . hydrALAZINE (APRESOLINE) 25 MG tablet Take 1 tablet (25 mg total) by mouth 3 (three) times daily. 270 tablet 5 04/28/2014 at Unknown time  . isosorbide mononitrate (IMDUR) 30 MG 24 hr tablet TAKE 1 TABLET (30 MG TOTAL) BY MOUTH DAILY. 90 tablet 3 04/28/2014 at Unknown time  . LANTUS 100 UNIT/ML injection INJECT 0.2 MLS (20 UNITS TOTAL) INTO THE SKIN AT BEDTIME. 10 mL 5 04/28/2014 at Unknown time  . nitroGLYCERIN (NITROSTAT) 0.4 MG SL tablet Place 0.4 mg under the tongue every 5 (five) minutes as needed. For chest pain. Maximum of 3 doses   unknown  . NOVOLOG FLEXPEN 100 UNIT/ML FlexPen INJECT 8 UNITS INTO THE SKIN THREE TIMES DAILY BEFORE MEALS. 100 mL 9 04/28/2014 at Unknown time  . PROAIR HFA 108 (90 BASE) MCG/ACT inhaler INHALE 1 PUFF EVERY 6 HOURS AS NEEDED FOR WHEEZE 8.5 each 2 unknown  . calcitRIOL (ROCALTROL) 0.25 MCG capsule Take 2 capsules (0.5 mcg total) by mouth daily. (Patient not taking: Reported on 04/20/2014) 60 capsule 5 Not Taking at Unknown time  . calcium citrate-vitamin D (CITRACAL+D) 315-200 MG-UNIT per tablet Take 2 tablets by mouth 2 (two) times daily. (Patient not taking: Reported on 04/29/2014) 100 tablet 3 04/20/2014 at Unknown time  . ferrous sulfate 325 (65 FE) MG tablet TAKE 1 TABLET BY MOUTH 3 TIMES DAILY WITH MEALS. (Patient not taking: Reported on 04/20/2014) 90 tablet 5 Not Taking at Unknown time  . pantoprazole (PROTONIX) 20 MG tablet Take 2 tablets (40 mg total) by mouth daily. (Patient not taking: Reported on 04/20/2014) 30 tablet 1 Not Taking at Unknown time  . sennosides-docusate sodium (SENOKOT-S) 8.6-50 MG tablet Take 1 tablet by mouth daily. (Patient not taking: Reported on 04/20/2014) 90 tablet 3 Not Taking at Unknown time  . traMADol (ULTRAM) 50 MG tablet  Take 1 tablet (50 mg total) by mouth every 6 (six) hours as needed for moderate pain. (Patient not taking: Reported on 04/20/2014) 20 tablet 0 Not Taking at Unknown time   Scheduled Meds: . sodium chloride   Intravenous Once  . amLODipine  5 mg Oral Daily  . amoxicillin-clavulanate  1 tablet Oral BID  . aspirin EC  81 mg Oral Daily  . calcitRIOL  0.5 mcg Oral Daily  . calcium-vitamin D  1 tablet Oral BID WC  . carvedilol  25 mg Oral BID WC  . gi cocktail  30 mL Oral Once  . heparin subcutaneous  5,000 Units Subcutaneous 3 times per day  .  hydrALAZINE  25 mg Oral TID  . insulin aspart  0-15 Units Subcutaneous TID WC  . insulin aspart  0-5 Units Subcutaneous QHS  . insulin aspart  4 Units Subcutaneous TID WC  . insulin glargine  20 Units Subcutaneous QHS  . isosorbide mononitrate  60 mg Oral Daily  . pantoprazole  40 mg Oral BID  . potassium chloride  40 mEq Oral Once  . regadenoson      . regadenoson  0.4 mg Intravenous Once  . rosuvastatin  20 mg Oral Daily  . 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 Patient has trace to 1+ pitting lower extremity edema, but not currently in an exacerbation. CXR shows no acute changes. BNP elevated at 539.4. Troponins negative x 3. Lungs CTA b/l. Cardiology recommended repeating echocardiogram which showed new systolic dysfunction of 40-45%. Cardiology recommended myoview which is being completed  today.   Addendum: Patient's myoview showed a dense inferior wall scar, but no other areas of significant regional ischemia. No plan for cardiac cath at this time. Global LV ischemia is a possibility, but she is not a good surgical candidate. Patient has severe ischemic cardiomyopathy with new mild LV systolic dysfunction in a patient with severe chronic kidney disease. Dr. Royann Shiversroitoru recommends continuing BB and nitrates with current medical regimen as patient cannot have ACEI or ranolazine. Patient is okay to discharge and will follow up with PCP on 05/09/14 and Cardiology on 05/13/14.  -Daily weights -Strict I/O's -Carvedilol 25 mg BID -Imdur to 60 mg daily -Follow up with Cardiology -Follow up with Internal Medicine  GERD: No more episodes of indigestion. Patient should continue Protonix 40 mg BID for 4 weeks with follow up with PCP and then decrease to 40 mg daily. -Protonix 40 mg BID -Carb modified diet  History of Recurrent UTI: Patient has a history of recurrent UTI with recent E. Coli UTI on 04/24/14 with 7 days of Augmentin. This was thoroughly evaluated during last admission during which GI and Urology doubted sigmoido-vesicular fistula and recommend outpatient cystoscopy. No leukocytosis. -Augmentin 500-125 mg BID for 1 more days (End 05/01/14) -Outpatient cystoscopy  Spinal Stenosis: S/p central and lateral decmpression L3-4, L4-5, L5-S1 with chronic pain at baseline. Patient takes tramadol at home. -Tramadol 50 mg Q6H prn  Chronic Non-Anion Gap Metabolic Acidosis: Low bicarb seems to be new since October 2015, bicarb 18>19>18. Differential includes RTA, early renal failure with impaired generation of ammonia and also sigmoido-vesicular fistula. Patient denies any diarrhea.  -Monitor  -Cystoscopy outpatient  CKD stage II: Baseline creatinine ~2.5-2.9, but 3.27 on admission secondary to recent lasix. This has trended from 3.27>2.9>2.64>2.68. -At baseline -Follow up BMET as  outpatient  COPD: COPD changes seen on CXR, uses proair at home for SOB/wheezing/chest tightness, but never formally diagnosed and no recent PFT's on chart. Lungs are CTA b/l.  -Outpatient PFTs -Duonebs Q6H prn  Chronic Normocytic Anemia: Likely 2/2 to CKD. Baseline hgb around 10,  9.1 on admission> 8.9>8.1 today. Hemoglobin has ranged from 8-11 since 2012. EGD last admission normal. On po iron at home. -Stable  CBD Dilation: CT abdomen during last admission showed 1.7 cm dilated, gallblader prominence and intrahepatic biliary duct dilation. GI determined this to be chronic and unlikely to be obstructed.  -Recommended outpatient MRI/MRCP if clinically warranted   CAD s/p CABG: Patient is on ASA, coreg, and Imdur at home. Cardiology recommended increasing Imdur to 60 mg daily. Troponins negative x 3. Cardiology recommended Myoview to evaluate possible ischemic cause of newly diagnosed systolic CHF. -Myoview today  -Continue ASA 81 mg daily -Continue Coreg 25 mg BID -Imdur to 60 mg daily  DM II: Patient takes novolog 8 units tid and lantus 20 units qhs at home. HgbA1c 8.1 on 04/21/2014. -SSI, meal time and QHS correction  -Lantus 20 units  -Novolog 4 units TID WC   HTN: BP 141/67. Patient takes norvasc 5 mg, coreg 25mg  BID, hydralazine 25mg  TID, and imdur 30mg  daily at home.  -Continue home regimen with increase of Imdur to 60 mg daily.   Thrombocytopenia: Baseline 110-130's. 127 on admission>128>111 today, stable. Hx of Hep B. LFT's recently normal. -Stable  DVT/PE ppx:  Heparin SQ TID  Dispo: Disposition is deferred at this time, awaiting improvement of current medical problems.  Anticipated discharge in approximately 1-2 day(s).   The patient does have a current PCP (Rich Number, MD) and does need an [facility] hospital follow-up appointment after discharge.  The patient does not have transportation limitations that hinder transportation to clinic appointments.  .Services Needed at time  of discharge: Y = Yes, Blank = No PT:   OT:   RN:   Equipment:   Other:     LOS: 3 days   Jill Alexanders, DO PGY-1 Internal Medicine Resident Pager # [phone] 05/01/2014 12:23 PM

## 2014-05-01 NOTE — Consult Note (Signed)
Met with the patient at bedside and two family members present in the room. Also, the nurse Ms. Ardis Hughs, RN.  Called the interpreter line [phone] and requested [place] as the language. The interpreter # [phone] reviewed the information about the referral for [facility] to follow up with the patient's transition of care to follow up with her discharge instructions, review of medications, and follow up appointments explained.  Patient verbalized she would like the services very much but would like to have someone to check on her like every day.  Explained to the patient about the services of care management being different than [facility],  who could see her more frequently but required a doctor's order for certain needs.  She verbalized understanding and said she was still interested in the help from [facility]. Family members request that the information packet be left and that the patient's grandchildren who speaks and reads English will help her with the paperwork.  Paperwork left at bedside and patient to receive follow up telephone calls and be evaluated for home visits.  Inpatient case manager made aware of [facility] consult and that patient's possible home health needs. [facility] Management does not interfere with or replace any services arranged for this patient.  For questions, please call Natividad Brood, RN, BSN, CCM,  [facility] Liaison at [phone].

## 2014-05-01 NOTE — Consult Note (Signed)
Ref[facility].  Came to the room but the patient is currently in a procedure. Follow up for referral needs.  Chart encounter reviewed.  For questions, please call Charlesetta ShanksVictoria Iviana Blasingame, RN, BSN, CCM [facility] Liaison @ [phone].

## 2014-05-01 NOTE — Discharge Instructions (Signed)
Thank you for allowing us to be involved in your healthcare while you were hospitalized at [facility].   Please note that there have been changes to your home medications.  --> PLEASE LOOK AT YOUR DISCHARGE MEDICATION LIST FOR DETAILS.  Please call your PCP if you have any questions or concerns, or any difficulty getting any of your medications.  Please return to the ER if you have worsening of your symptoms or new severe symptoms arise.  FOR YOUR INDIGESTION: Please take your Protonix 40 mg twice a day and follow up in our clinic on 05/09/14 with Dr. Leatha GildingMallory.   FOR YOUR HEART: Cardiology increased your Imdur from 30 mg daily to 60 mg daily. I have sent in a new prescription to your pharmacy for this. Follow up with Jacolyn ReedyMichele Lenze at the address listed on 05/13/14 at 10:45 pm.   FOR YOUR URINARY DISCOMFORT: Please make an appointment with Dr. Sherron MondayMacDiarmid (Urologist) for an appointment. He will want to do a cystoscopy on you to look into the bladder. The office number is [phone]. If you have trouble making this appointment, please tell our clinic and we can assist you with this.   Gastroesophageal Reflux Disease, Adult Gastroesophageal reflux disease (GERD) happens when acid from your stomach flows up into the esophagus. When acid comes in contact with the esophagus, the acid causes soreness (inflammation) in the esophagus. Over time, GERD may create small holes (ulcers) in the lining of the esophagus. CAUSES   Increased body weight. This puts pressure on the stomach, making acid rise from the stomach into the esophagus.  Smoking. This increases acid production in the stomach.  Drinking alcohol. This causes decreased pressure in the lower esophageal sphincter (valve or ring of muscle between the esophagus and stomach), allowing acid from the stomach into the esophagus.  Late evening meals and a full stomach. This increases pressure and acid production in the stomach.  A  malformed lower esophageal sphincter. Sometimes, no cause is found. SYMPTOMS   Burning pain in the lower part of the mid-chest behind the breastbone and in the mid-stomach area. This may occur twice a week or more often.  Trouble swallowing.  Sore throat.  Dry cough.  Asthma-like symptoms including chest tightness, shortness of breath, or wheezing. DIAGNOSIS  Your caregiver may be able to diagnose GERD based on your symptoms. In some cases, X-rays and other tests may be done to check for complications or to check the condition of your stomach and esophagus. TREATMENT  Your caregiver may recommend over-the-counter or prescription medicines to help decrease acid production. Ask your caregiver before starting or adding any new medicines.  HOME CARE INSTRUCTIONS   Change the factors that you can control. Ask your caregiver for guidance concerning weight loss, quitting smoking, and alcohol consumption.  Avoid foods and drinks that make your symptoms worse, such as:  Caffeine or alcoholic drinks.  Chocolate.  Peppermint or mint flavorings.  Garlic and onions.  Spicy foods.  Citrus fruits, such as oranges, lemons, or limes.  Tomato-based foods such as sauce, chili, salsa, and pizza.  Fried and fatty foods.  Avoid lying down for the 3 hours prior to your bedtime or prior to taking a nap.  Eat small, frequent meals instead of large meals.  Wear loose-fitting clothing. Do not wear anything tight around your waist that causes pressure on your stomach.  Raise the head of your bed 6 to 8 inches with wood blocks to help you sleep.  Extra pillows will not help.  Only take over-the-counter or prescription medicines for pain, discomfort, or fever as directed by your caregiver.  Do not take aspirin, ibuprofen, or other nonsteroidal anti-inflammatory drugs (NSAIDs). SEEK IMMEDIATE MEDICAL CARE IF:   You have pain in your arms, neck, jaw, teeth, or back.  Your pain increases or  changes in intensity or duration.  You develop nausea, vomiting, or sweating (diaphoresis).  You develop shortness of breath, or you faint.  Your vomit is green, yellow, black, or looks like coffee grounds or blood.  Your stool is red, bloody, or black. These symptoms could be signs of other problems, such as heart disease, gastric bleeding, or esophageal bleeding. MAKE SURE YOU:   Understand these instructions.  Will watch your condition.  Will get help right away if you are not doing well or get worse. Document Released: 01/27/2005 Document Revised: 07/12/2011 Document Reviewed: 11/06/2010 [facility] Patient Information 2015 [employer] [place]. This information is not intended to replace advice given to you by your health care provider. Make sure you discuss any questions you have with your health care provider.

## 2014-05-01 NOTE — Progress Notes (Signed)
UR completed 

## 2014-05-09 ENCOUNTER — Ambulatory Visit (INDEPENDENT_AMBULATORY_CARE_PROVIDER_SITE_OTHER): Payer: Medicare Other | Admitting: Internal Medicine

## 2014-05-09 ENCOUNTER — Encounter: Payer: Self-pay | Admitting: Internal Medicine

## 2014-05-09 VITALS — BP 140/72 | HR 74 | Temp 98.1°F | Ht 60.0 in | Wt 106.4 lb

## 2014-05-09 DIAGNOSIS — I255 Ischemic cardiomyopathy: Secondary | ICD-10-CM

## 2014-05-09 DIAGNOSIS — K219 Gastro-esophageal reflux disease without esophagitis: Secondary | ICD-10-CM

## 2014-05-09 DIAGNOSIS — I5189 Other ill-defined heart diseases: Secondary | ICD-10-CM

## 2014-05-09 DIAGNOSIS — N321 Vesicointestinal fistula: Secondary | ICD-10-CM

## 2014-05-09 DIAGNOSIS — E119 Type 2 diabetes mellitus without complications: Secondary | ICD-10-CM

## 2014-05-09 LAB — GLUCOSE, CAPILLARY: Glucose-Capillary: 285 mg/dL — ABNORMAL HIGH (ref 70–99)

## 2014-05-09 NOTE — Assessment & Plan Note (Signed)
Patient's A1c has increased from 7.7 --> 8.1 (in 04/2014). Likely elevated currently due to recent illnesses. Given the patient's age, would discourage tight glycemic control.   - Repeat A1c in 3 months

## 2014-05-09 NOTE — Assessment & Plan Note (Addendum)
The patient's chest tightness and shortness of breath has subsided since her recent admission for these complaints. On cardiac workup during that admission, she was found to have negative cardiac enzymes, non-specific changes on EKG, progression of CHF with an echo showing EF 40-45% and low to intermediate-risk myoview. Myoview showed: 1. Moderate-sized infarct in the mid inferior wall. No areas of reversible ischemia. 2. Dyskinesia in the moderate infarct. Otherwise normal. 3. Left ventricular ejection fraction 74% 4. Low to intermediate-risk stress test findings*.  Cardiology assessed the patient and recommended increasing her Imdur to 60 mg and continuing her coreg. She has follow up with Dr. Royann Shiversroitoru (cardiology) on 05/13/14.  - Continue Imdur 60 mg by mouth daily - Continue coreg 25 mg by mouth BID - Patient to attend cardiology appointment on 05/13/14

## 2014-05-09 NOTE — Progress Notes (Signed)
Subjective:     Patient ID: Bianca [name], female   DOB: 1931/01/22, 81 y.o.   MRN: [id number]  HPI Bianca [name] is an 81 yo woman with CAD s/p CABG, HTN, HLD and recurrent UTIs who is presenting to clinic for hospital follow up. She was recently admitted from 04/29/14 to 05/01/14 for chest tightness and shortness of breath.   During this admission, there was some difficulty discerning the patient's symptoms; she is not a good historian and her primary language is Khmer (it appears that interpreters were used, but were not always effective in eliciting the full history). In any case, she underwent a cardiac workup before the investigating team determined that her chest pain was likely secondary to GERD. On cardiac workup, she was found to have negative cardiac enzymes, non-specific changes on EKG, progression of CHF with an echo showing EF 40-45% and low to intermediate-risk myoview. Cardiology assessed the patient and recommended increasing her Imdur to 60 mg and continuing her coreg. She has follow up with Dr. Royann [name] (cardiology) on 05/13/14. Since discharge, she has had no further chest pain and no shortness of breath.  She also had some symptoms of UTI on her recent admission. She had a prior admission for e coli UTI on 04/24/14 and completed a full course of Augmentin on 05/01/14. She had complaints of dysuria and pneumaturia, though her UA showed no nitrites, trace leukocytes and few bacteria (improved from prior). Her CT abdomen from the prior admission did show possible small fistula from the sigmoid colon. She had a foley in the past, but not recently. GI and urology doubted sigmoidovesicular fistula, but recommended outpatient cystoscopy with Dr. Sherron [name]. Patient does not have a urology appointment scheduled. Since discharge, she reports that her urinary symptoms have improved. She has had occasional burning but no chills or subjective fevers.   Review of Systems  Constitutional: Negative  for fever, chills, diaphoresis, activity change and appetite change.  HENT: Negative.   Eyes: Negative.   Respiratory: Negative.  Negative for chest tightness and shortness of breath.   Cardiovascular: Negative.  Negative for chest pain.       Occasionally has leg swelling bilaterally   Gastrointestinal: Negative.   Genitourinary: Positive for dysuria. Negative for urgency, frequency, hematuria, flank pain, decreased urine volume, difficulty urinating and pelvic pain.  Musculoskeletal: Negative.   Skin: Negative.   Neurological: Negative.        Objective:   Physical Exam  Constitutional: She is oriented to person, place, and time. She appears well-developed and well-nourished.  HENT:  Head: Normocephalic and atraumatic.  Eyes: Conjunctivae and EOM are normal. Pupils are equal, round, and reactive to light.  Neck: Normal range of motion.  Cardiovascular: Normal rate, regular rhythm and normal heart sounds.   No murmur heard. Pulmonary/Chest: Effort normal and breath sounds normal. No respiratory distress. She has no wheezes.  Abdominal: Soft. Bowel sounds are normal. She exhibits no distension. There is no tenderness.  Genitourinary:  No CVA tenderness  Musculoskeletal: She exhibits no edema.  Neurological: She is alert and oriented to person, place, and time.  Skin: Skin is warm and dry.        Bianca [name] is here for hospital follow-up for an admission where she received cardiac workup. She also had a recent hospitalization for recurrent UTIs. Her chest pain and urinary symptoms have improved since discharge.

## 2014-05-09 NOTE — Progress Notes (Signed)
Internal Medicine Clinic Attending  Case discussed with Dr. Mallory at the time of the visit.  We reviewed the resident's history and exam and pertinent patient test results.  I agree with the assessment, diagnosis, and plan of care documented in the resident's note. 

## 2014-05-09 NOTE — Assessment & Plan Note (Signed)
Patient has a history of recurrent UTI and complains of dysuria and pneumaturia. 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 She has no recent history of foley. GI and Urology doubted sigmoido-vesicular fistula but recommended outpatient cystoscopy. Patient dysuria has improved since antibiotic treatment for her most recent UTI (which was completed on 05/01/14).  - Urology appointment was scheduled with Dr. Sherron MondayMacDiarmid (05/24/14 at 2:45PM); to receive cystoscopy

## 2014-05-09 NOTE — Patient Instructions (Addendum)
Please attend your cardiology (heart doctor) appointment on 05/13/14.  We have also given you a referral to urology so that you can get a cystoscopy to investigate the cause of your UTIs:  MACDIARMID,SCOTT A, MD.    Specialty: Urology         [address] [place]  [place] [id number] [phone]   General Instructions:   Thank you for bringing your medicines today. This helps us keep you safe from mistakes.   Progress Toward Treatment Goals:  Treatment Goal 12/10/2013  Hemoglobin A1C improved  Blood pressure at goal  Prevent falls -    Self Care Goals & Plans:  Self Care Goal 05/09/2014  Manage my medications take my medicines as prescribed; bring my medications to every visit; refill my medications on time  Monitor my health keep track of my blood glucose; bring my glucose meter and log to each visit  Eat healthy foods eat fruit for snacks and desserts; drink diet soda or water instead of juice or soda; eat more vegetables; eat foods that are low in salt; eat baked foods instead of fried foods  Be physically active find an activity I enjoy  Meeting treatment goals -    Home Blood Glucose Monitoring 08/13/2013  Check my blood sugar 3 times a day  When to check my blood sugar before meals     Care Management & Community Referrals:  Referral 08/13/2013  Referrals made for care management support none needed  Referrals made to community resources none

## 2014-05-09 NOTE — Assessment & Plan Note (Signed)
Patient was discharged on Protonix 40 mg BID for 4 weeks. Her symptoms are already better controlled.  - Likely decrease protonix to 40 mg daily on follow-up visit (end of January) depending on symptoms

## 2014-05-12 ENCOUNTER — Telehealth: Payer: Self-pay | Admitting: Internal Medicine

## 2014-05-12 DIAGNOSIS — E119 Type 2 diabetes mellitus without complications: Secondary | ICD-10-CM

## 2014-05-12 NOTE — Telephone Encounter (Signed)
Placed referral to ophthalmology for assessment for diabetic retinopathy and has hx cataracts.

## 2014-05-13 ENCOUNTER — Encounter: Payer: Self-pay | Admitting: Physician Assistant

## 2014-05-13 ENCOUNTER — Ambulatory Visit (INDEPENDENT_AMBULATORY_CARE_PROVIDER_SITE_OTHER): Payer: Medicare Other | Admitting: Physician Assistant

## 2014-05-13 VITALS — BP 140/78 | HR 73 | Ht 61.0 in | Wt 108.8 lb

## 2014-05-13 DIAGNOSIS — I10 Essential (primary) hypertension: Secondary | ICD-10-CM

## 2014-05-13 DIAGNOSIS — I255 Ischemic cardiomyopathy: Secondary | ICD-10-CM

## 2014-05-13 DIAGNOSIS — N184 Chronic kidney disease, stage 4 (severe): Secondary | ICD-10-CM

## 2014-05-13 DIAGNOSIS — I2510 Atherosclerotic heart disease of native coronary artery without angina pectoris: Secondary | ICD-10-CM

## 2014-05-13 DIAGNOSIS — I5042 Chronic combined systolic (congestive) and diastolic (congestive) heart failure: Secondary | ICD-10-CM

## 2014-05-13 NOTE — Assessment & Plan Note (Signed)
BP stable.

## 2014-05-13 NOTE — Patient Instructions (Signed)
Your physician recommends that you schedule a follow-up appointment in 2 months with Dr. Antoine PocheHOCHREIN [facility].   Your physician recommends that you continue on your current medications as directed. Please refer to the Current Medication list given to you today.

## 2014-05-13 NOTE — Assessment & Plan Note (Signed)
Patient had admission with atypical chest pain. Troponins were negative. Stress Myoview showed moderate size infarct in the mid inferior wall with no areas of reversible ischemia EF 74%. This was a low to intermediate risk stress finding. Because of creatinine close to 3 medical management was recommended. She is stable without further chest pain on current medications. Imdur was increased to 60 mg in the hospital.

## 2014-05-13 NOTE — Progress Notes (Signed)
HPI: This is an 81 year old female patient of Dr. Dewitt Rota seen here since 2013) with history of coronary artery disease status post CABG in April 2008 with last cath in 2012 revealing totally occluded proximal LAD, 80% OM 2, PDA filled with left-to-right collaterals, occluded SVG to the diagonal, occluded SVG to the OM 2, patent LIMA to the LAD.   She was recently admitted to the hospital with atypical chest pain. Her creatinine was close to 3. Troponins were negative and EKG shows prior inferior infarct. 2-D echo showed a drop in her LV function EF 40% to 45%. Because of this stress Myoview was performed and showed moderate size infarct in the mid inferior wall, no areas of reversible ischemia EF 74%. This was a low to intermediate risk stress finding. Medical management was recommended. Patient's endurance increased to 60 mg daily. She was maintained on Coreg 25 mg twice a day and aspirin 81 mg daily. Patient also had recurrent UTI and CT showed concern for free air in the bladder. Urology had recommended outpatient follow-up with cystoscopy. She also had gallbladder prominence and intrahepatic biliary duct". GI recommended outpatient MRI/MRCP. Hemoglobin usually is around 10 but was 8 during hospitalization EGD was unremarkable. May need colonoscopy.  Patient is here today for post hospital follow-up. She is accompanied by her daughter and an interpreter for [place] language. She denies any chest pain, palpitations, dyspnea, dyspnea on exertion, dizziness, or presyncope. Her daughter cooks low salt for her. She is been doing quite well since hospitalization. She has had blood work done since hospitalization but can't communicate which doctor's office did this.  Allergies  Allergen Reactions  . Cozaar Other (See Comments)    Sig increase in her creatinine that resolved once it was D/C'd  Per Cr visible in epic, it seems her baseline Cr = 1.7-1.9, and the slight improvement to  Cr was an outlier when compared to recent Cr.  We will do a trial of Lisinopril (and monitor for 30% rise in Cr), as she would greatly benefit from renal protection.  . Lisinopril     Cough      Current Outpatient Prescriptions  Medication Sig Dispense Refill  . acetaminophen (TYLENOL) 500 MG tablet Take 2 tablets (1,000 mg total) by mouth every 8 (eight) hours as needed for mild pain. 100 tablet 2  . amLODipine (NORVASC) 5 MG tablet Take 1 tablet (5 mg total) by mouth daily. 30 tablet 11  . amoxicillin-clavulanate (AUGMENTIN) 500-125 MG per tablet Take 1 tablet (500 mg total) by mouth 2 (two) times daily. 3 tablet 0  . aspirin EC 81 MG tablet TAKE 1 TABLET BY MOUTH EVERY DAY 150 tablet 1  . B-D ULTRAFINE III SHORT PEN 31G X 8 MM MISC USE AS DIRECTED TO INJECT INSULIN 3 TIMES A DAY 100 each 6  . BAYER CONTOUR NEXT TEST test strip TEST BLOOD SUGAR UP TO 5 TIMES A DAY 100 each 11  . calcitRIOL (ROCALTROL) 0.25 MCG capsule Take 2 capsules (0.5 mcg total) by mouth daily. (Patient not taking: Reported on 04/20/2014) 60 capsule 5  . calcium citrate-vitamin D (CITRACAL+D) 315-200 MG-UNIT per tablet Take 2 tablets by mouth 2 (two) times daily. (Patient not taking: Reported on 04/29/2014) 100 tablet 3  . carvedilol (COREG) 25 MG tablet TAKE 1 TABLET (25 MG TOTAL) BY MOUTH 2 (TWO) TIMES DAILY WITH A MEAL. 120 tablet 3  . CRESTOR 20 MG tablet TAKE 1 TABLET (20 MG TOTAL) BY  MOUTH DAILY. 90 tablet 3  . docusate sodium (COLACE) 100 MG capsule Take 1 capsule (100 mg total) by mouth daily as needed for mild constipation. 30 capsule 1  . ferrous sulfate 325 (65 FE) MG tablet TAKE 1 TABLET BY MOUTH 3 TIMES DAILY WITH MEALS. (Patient not taking: Reported on 04/20/2014) 90 tablet 5  . hydrALAZINE (APRESOLINE) 25 MG tablet Take 1 tablet (25 mg total) by mouth 3 (three) times daily. 270 tablet 5  . isosorbide mononitrate (IMDUR) 60 MG 24 hr tablet Take 1 tablet (60 mg total) by mouth daily. 30 tablet 11  . LANTUS  100 UNIT/ML injection INJECT 0.2 MLS (20 UNITS TOTAL) INTO THE SKIN AT BEDTIME. 10 mL 5  . nitroGLYCERIN (NITROSTAT) 0.4 MG SL tablet Place 0.4 mg under the tongue every 5 (five) minutes as needed. For chest pain. Maximum of 3 doses    . NOVOLOG FLEXPEN 100 UNIT/ML FlexPen INJECT 8 UNITS INTO THE SKIN THREE TIMES DAILY BEFORE MEALS. 100 mL 9  . pantoprazole (PROTONIX) 40 MG tablet Take 1 tablet (40 mg total) by mouth 2 (two) times daily. 60 tablet 0  . PROAIR HFA 108 (90 BASE) MCG/ACT inhaler INHALE 1 PUFF EVERY 6 HOURS AS NEEDED FOR WHEEZE 8.5 each 2  . sennosides-docusate sodium (SENOKOT-S) 8.6-50 MG tablet Take 1 tablet by mouth daily. (Patient not taking: Reported on 04/20/2014) 90 tablet 3  . traMADol (ULTRAM) 50 MG tablet Take 1 tablet (50 mg total) by mouth every 6 (six) hours as needed for moderate pain. (Patient not taking: Reported on 04/20/2014) 20 tablet 0   No current facility-administered medications for this visit.    Past Medical History  Diagnosis Date  . Diabetes mellitus, type 2   . GERD (gastroesophageal reflux disease)   . Hepatitis B   . Hyperlipemia   . Hypertension   . Cervical dysplasia   . Atrophic vaginitis   . Spinal stenosis     s/p decompression  . CAD (coronary artery disease) April 2008    s/p CABG;    cath 8/12:  LM patent, pLAD occluded, OM1 30-40%, pOM2 80% and 60% after the anastomosis, pRCA occluded, S-DX occluded (old), S-OM 2 occluded (new), L-LAD patent, dLAD provided collats to the PDA; Med Rx  rec.; consider PCI to OM2 if fails med Rx  . Hx of hysterectomy   . UTI (lower urinary tract infection)   . Diverticulosis of intestine 04/29/2014    Of ascending colon noted on CT abd/ pelvis 04/20/14    Past Surgical History  Procedure Laterality Date  . Back surgery      decompression L3-S1  . Coronary artery bypass graft  April 2008  . Total abdominal hysterectomy      cervical dysplasia  . Esophagogastroduodenoscopy Left 04/22/2014     Procedure: ESOPHAGOGASTRODUODENOSCOPY (EGD);  Surgeon: Shirley Friar, MD;  Location: [facility] ENDOSCOPY;  Service: Endoscopy;  Laterality: Left;    No family history on file.  History   Social History  . Marital Status: Widowed    Spouse Name: N/A    Number of Children: N/A  . Years of Education: N/A   Occupational History  . Not on file.   Social History Main Topics  . Smoking status: Never Smoker   . Smokeless tobacco: Never Used  . Alcohol Use: No  . Drug Use: No  . Sexual Activity: Not on file   Other Topics Concern  . Not on file   Social History Narrative  [place] immigrant   Housewife   Widowed   Lives w/ her son    ROS: See history of present illness otherwise negative  BP 140/78 mmHg  Pulse 73  Ht 5\' 1"  (1.549 m)  Wt 108 lb 12.8 oz (49.351 kg)  BMI 20.57 kg/m2  PHYSICAL EXAM: Well-nournished, in no acute distress. Neck: No JVD, HJR, Bruit, or thyroid enlargement  Lungs: No tachypnea, clear without wheezing, rales, or rhonchi  Cardiovascular: RRR, PMI not displaced, Normal S1 and S2, positive S4, 2/6 systolic murmur at the left sternal border, no bruit, thrill, or heave.  Abdomen: BS normal. Soft without organomegaly, masses, lesions or tenderness.  Extremities: without cyanosis, clubbing or edema. Good distal pulses bilateral  SKin: Warm, no lesions or rashes   Musculoskeletal: No deformities  Neuro: no focal signs   Wt Readings from Last 3 Encounters:  05/09/14 106 lb 6.4 oz (48.263 kg)  05/01/14 107 lb 4.8 oz (48.671 kg)  04/24/14 116 lb (52.617 kg)    Lab Results  Component Value Date   WBC 6.2 05/01/2014   HGB 8.1* 05/01/2014   HCT 25.1* 05/01/2014   PLT 111* 05/01/2014   GLUCOSE 150* 05/01/2014   CHOL 218* 06/11/2013   TRIG 235* 06/11/2013   HDL 65 06/11/2013   LDLCALC 106* 06/11/2013   ALT 12 04/22/2014   AST 16 04/22/2014   NA 139 05/01/2014   K 3.9 05/01/2014   CL 110 05/01/2014   CREATININE 2.68* 05/01/2014   BUN 27*  05/01/2014   CO2 21 05/01/2014   TSH 0.83 09/01/2011   INR 1.06 01/15/2012   HGBA1C 8.1* 04/21/2014   MICROALBUR 143.03* 06/11/2013    EKG: Normal sinus rhythm nonspecific ST-T wave changes  Stress Myoview 05/01/14 IMPRESSION: 1. Moderate-sized infarct in the mid inferior wall. No areas of reversible ischemia.   2. Dyskinesia in the moderate infarct.  Otherwise normal.   3. Left ventricular ejection fraction 74%   4. Low to intermediate-risk stress test findings*.   *2012 Appropriate Use Criteria for Coronary Revascularization Focused Update: J Am Coll Cardiol. 2012;59(9):857-881. http://content.dementiazones.comonlinejacc.org/article.aspx?articleid=1201161     Electronically Signed   By: Andreas NewportGeoffrey  Lamke M.D.   On: 05/01/2014 14:45  2-D echo 04/30/14 Study Conclusions  - Left ventricle: The cavity size was normal. Wall thickness was   increased in a pattern of mild LVH. There was mild focal basal   hypertrophy of the septum. Systolic function was mildly to   moderately reduced. The estimated ejection fraction was in the   range of 40% to 45%. Regional wall motion abnormalities cannot be   excluded. Doppler parameters are consistent with high ventricular   filling pressure. - Mitral valve: Calcified annulus. There was mild regurgitation. - Left atrium: The atrium was mildly dilated. - Tricuspid valve: There was moderate regurgitation. - Pulmonary arteries: PA peak pressure: 39 mm Hg (S).

## 2014-05-13 NOTE — Assessment & Plan Note (Signed)
Patient's last creatinine was 2.689 hospital. She did have blood work since then but we do not have the results of this. I will not repeat.

## 2014-05-13 NOTE — Assessment & Plan Note (Signed)
No evidence of heart failure and exam today. 2-D echo showed a decrease in LV function EF 40-45% but stress Myoview EF was 74%. Continue current treatment. Follow-up with Dr.Hochrein in 2 months.

## 2014-05-21 ENCOUNTER — Telehealth: Payer: Self-pay | Admitting: Dietician

## 2014-05-21 NOTE — Telephone Encounter (Signed)
Forwarded message from [facility] Betha LoaWanna [name] [phone] has questions. Will be making a home visit tomorrow and wants parameters. :  1- how often is patient supposed to check blood sugar? 3-4 times a day 2-  when should patient/THN call how high and how low and how often? When 1 blood sugar is <80 or  blood sugar is > 250 for more than 24 hours 3- when not to take her Novolog or lantus. Never skip lantus, skip Novolog When she does not eat good sized meal 4- what is target blood sugar range? Last note says discourage tight control so 100-250 may be reasonable target range  Also notified Nickola MajorWanna that patient has poor vision and several family members caring for her.  Left her a message with the bolded answers and routed this note to Dr. Beckie Saltsivet in case she wants to make changes.

## 2014-05-27 ENCOUNTER — Ambulatory Visit: Payer: Medicare Other | Admitting: Internal Medicine

## 2014-05-30 ENCOUNTER — Other Ambulatory Visit: Payer: Self-pay | Admitting: *Deleted

## 2014-05-30 MED ORDER — GLUCOSE BLOOD VI STRP: 1.0000 | ORAL_STRIP | Status: DC | PRN

## 2014-06-03 ENCOUNTER — Telehealth: Payer: Self-pay | Admitting: Internal Medicine

## 2014-06-03 NOTE — Telephone Encounter (Signed)
Calling to confirm patients appointment for 06/04/14 at 3:15. Patient confirmed

## 2014-06-04 ENCOUNTER — Encounter: Payer: Self-pay | Admitting: Internal Medicine

## 2014-06-04 ENCOUNTER — Ambulatory Visit (INDEPENDENT_AMBULATORY_CARE_PROVIDER_SITE_OTHER): Payer: Medicare Other | Admitting: Internal Medicine

## 2014-06-04 VITALS — BP 144/65 | HR 73 | Temp 98.0°F | Ht 61.0 in | Wt 109.0 lb

## 2014-06-04 DIAGNOSIS — N184 Chronic kidney disease, stage 4 (severe): Secondary | ICD-10-CM

## 2014-06-04 DIAGNOSIS — K219 Gastro-esophageal reflux disease without esophagitis: Secondary | ICD-10-CM

## 2014-06-04 DIAGNOSIS — I10 Essential (primary) hypertension: Secondary | ICD-10-CM

## 2014-06-04 DIAGNOSIS — Z0000 Encounter for general adult medical examination without abnormal findings: Secondary | ICD-10-CM

## 2014-06-04 DIAGNOSIS — Z794 Long term (current) use of insulin: Secondary | ICD-10-CM

## 2014-06-04 DIAGNOSIS — E119 Type 2 diabetes mellitus without complications: Secondary | ICD-10-CM

## 2014-06-04 DIAGNOSIS — N321 Vesicointestinal fistula: Secondary | ICD-10-CM

## 2014-06-04 DIAGNOSIS — I5042 Chronic combined systolic (congestive) and diastolic (congestive) heart failure: Secondary | ICD-10-CM

## 2014-06-04 DIAGNOSIS — E1122 Type 2 diabetes mellitus with diabetic chronic kidney disease: Secondary | ICD-10-CM

## 2014-06-04 DIAGNOSIS — H109 Unspecified conjunctivitis: Secondary | ICD-10-CM | POA: Insufficient documentation

## 2014-06-04 MED ORDER — POLYMYXIN B-TRIMETHOPRIM 10000-0.1 UNIT/ML-% OP SOLN: 1.0000 [drp] | Freq: Four times a day (QID) | OPHTHALMIC | Status: DC

## 2014-06-04 MED ORDER — GLUCOSE BLOOD VI STRP: 1.0000 | ORAL_STRIP | Status: DC | PRN

## 2014-06-04 NOTE — Assessment & Plan Note (Signed)
There was previous consideration of titrating patient's Protonix down to 40 mg daily from twice a day. However, patient states that she has not been taking her Protonix at all and that her symptoms have resolved. -Discontinue Protonix.

## 2014-06-04 NOTE — Assessment & Plan Note (Signed)
Patient reporting a two-week history of high pruritus and pain. Patient states that it started in her right eye and eventually started having the same symptoms in her left eye as well. She states that she has had eye discharge that is sticky and that the movement of her left eyelid is restricted. Patient denies any similar sick contacts at home. Patient denies any associated cough, sneezing, or sore throat. Patient denies any previous history of any eye infections. -Trimethoprim-polymyxin B drops 1-2 drops 4 times a day. -Patient to return to clinic in 2 weeks to further assess resolution (along with CBG check and health maintenance items).

## 2014-06-04 NOTE — Patient Instructions (Signed)
I have prescribed eyedrops for what seems to be an infection of both of her eyes old conjunctivitis. He should notice improvement in the itching and pain in the next couple days. Please continue taking Tylenol for your left shoulder pain. Please continue following up with cardiology for your heart failure. Please let us know if you have recurrent urinary symptoms as we can refer you to urology at that point. Please try to measure your blood sugar before each meal every day and return to clinic in 2 weeks so that we can check on your blood sugars and how your eyes are doing.  General Instructions:   Thank you for bringing your medicines today. This helps us keep you safe from mistakes.   Progress Toward Treatment Goals:  Treatment Goal 12/10/2013  Hemoglobin A1C improved  Blood pressure at goal  Prevent falls -    Self Care Goals & Plans:  Self Care Goal 05/09/2014  Manage my medications take my medicines as prescribed; bring my medications to every visit; refill my medications on time  Monitor my health keep track of my blood glucose; bring my glucose meter and log to each visit  Eat healthy foods eat fruit for snacks and desserts; drink diet soda or water instead of juice or soda; eat more vegetables; eat foods that are low in salt; eat baked foods instead of fried foods  Be physically active find an activity I enjoy  Meeting treatment goals -    Home Blood Glucose Monitoring 08/13/2013  Check my blood sugar 3 times a day  When to check my blood sugar before meals     Care Management & Community Referrals:  Referral 08/13/2013  Referrals made for care management support none needed  Referrals made to community resources none

## 2014-06-04 NOTE — Assessment & Plan Note (Signed)
Patient is due for several items on her health maintenance was which may include TDAP, colonoscopy, zoster vaccine, eye exam. Patient to follow-up in clinic in 2 weeks for blood glucose check and further health maintenance items at that point.

## 2014-06-04 NOTE — Progress Notes (Signed)
   Subjective:    Patient ID: Bianca [name], female    DOB: [birth year], 81 y.o.   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   Please refer to separate problem-list charting for more details.   Review of Systems  Constitutional: Negative for fever and chills.  HENT: Negative for rhinorrhea and sore throat.   Eyes: Negative for visual disturbance.  Respiratory: Negative for cough and shortness of breath.   Cardiovascular: Negative for chest pain and palpitations.  Gastrointestinal: Negative for nausea, vomiting, abdominal pain, diarrhea, constipation and blood in stool.  Genitourinary: Negative for dysuria and hematuria.  Neurological: Negative for syncope.       Objective:   Physical Exam  Constitutional: She is oriented to person, place, and time. She appears well-developed and well-nourished. No distress.  HENT:  Head: Normocephalic and atraumatic.  Eyes: EOM are normal. Pupils are equal, round, and reactive to light. Left eye exhibits no discharge.  Neck: Normal range of motion. Neck supple. No thyromegaly present.  Cardiovascular: Normal rate and regular rhythm.  Exam reveals no gallop and no friction rub.   No murmur heard. Pulmonary/Chest: Effort normal and breath sounds normal. No respiratory distress. She has no wheezes. She has no rales.  Abdominal: Soft. Bowel sounds are normal. She exhibits no distension. There is no tenderness. There is no rebound.  Musculoskeletal: She exhibits no edema.  Neurological: She is alert and oriented to person, place, and time. No cranial nerve deficit.  Skin: Skin is warm and dry. No rash noted.  Psychiatric: She has a normal mood and affect. Thought content normal.       Assessment & Plan:  Please refer to separate problem-list charting for more details.

## 2014-06-04 NOTE — Assessment & Plan Note (Signed)
History of recurrent UTI along with dysuria and hematuria. 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 Patient initially scheduled for a urology appointment on 05/24/2014 but did not make the appointment secondary to weather. Patient states that she has not had any recurrent urinary symptoms.  -Patient declining our offer to schedule another appointment with urology at this point. Patient is amenable to referral should she have recurrent symptoms in the future.

## 2014-06-04 NOTE — Assessment & Plan Note (Signed)
Lab Results  Component Value Date   HGBA1C 8.1* 04/21/2014   HGBA1C 7.7 12/10/2013   HGBA1C 9.3 06/11/2013     Assessment: Diabetes control: fair control Progress toward A1C goal:  unable to assess Comments: Patient not yet due for serial hemoglobin A1c today though blood glucose record from home showing that blood glucoses tend to run in the high 100s at the lowest but consistently in the 200s. However, patient only taking blood glucose levels once a day. Patient is currently on Lantus 20 units daily at bedtime and NovoLog 8 units 3 times a day.  Plan: Medications:  continue current medications, will see back in clinic in 2 weeks and consider titration of diabetes medications at that point given more regular blood glucose readings.. Test strips were reordered. Home glucose monitoring: Frequency:   3 times a day Timing:   before meals Instruction/counseling given: reminded to bring blood glucose meter & log to each visit Educational resources provided:   Self management tools provided:   Other plans: none

## 2014-06-04 NOTE — Assessment & Plan Note (Signed)
BP Readings from Last 3 Encounters:  06/04/14 144/65  05/13/14 140/78  05/09/14 140/72    Lab Results  Component Value Date   NA 139 05/01/2014   K 3.9 05/01/2014   CREATININE 2.68* 05/01/2014    Assessment: Blood pressure control: controlled Progress toward BP goal:  at goal Comments: Patient states that she is compliant with her amlodipine 5 mg daily, Coreg 25 mg twice a day, hydralazine 25 mg 3 times a day, Imdur 60 mg daily. She is at goal of less than 150/90 for her age.  Plan: Medications:  continue current medications Educational resources provided:   Self management tools provided:   Other plans: none

## 2014-06-04 NOTE — Assessment & Plan Note (Signed)
No evidence of heart failure on exam today. Last echocardiogram showing a left ventricular ejection fraction of 40-45% with a low to intermediate risk Myoview. -Patient to continue Imdur 60 mg daily and Coreg 25 mg twice a day and hydralazine 25 mg 3 times a day

## 2014-06-05 NOTE — Progress Notes (Signed)
[facility] Clinic Attending  Case discussed with Dr. Ngo at the time of the visit.  We reviewed the resident's history and exam and pertinent patient test results.  I agree with the assessment, diagnosis, and plan of care documented in the resident's note. 

## 2014-06-18 ENCOUNTER — Telehealth: Payer: Self-pay | Admitting: Internal Medicine

## 2014-06-18 NOTE — Telephone Encounter (Signed)
Call to patient to confirm appointment for 06/19/14 at 1:15 lmtcb ° °

## 2014-06-19 ENCOUNTER — Ambulatory Visit (INDEPENDENT_AMBULATORY_CARE_PROVIDER_SITE_OTHER): Payer: Medicare Other | Admitting: Internal Medicine

## 2014-06-19 VITALS — BP 113/63 | HR 74 | Temp 98.0°F | Wt 107.3 lb

## 2014-06-19 DIAGNOSIS — R059 Cough, unspecified: Secondary | ICD-10-CM

## 2014-06-19 DIAGNOSIS — E118 Type 2 diabetes mellitus with unspecified complications: Secondary | ICD-10-CM | POA: Diagnosis not present

## 2014-06-19 DIAGNOSIS — Z23 Encounter for immunization: Secondary | ICD-10-CM

## 2014-06-19 DIAGNOSIS — H109 Unspecified conjunctivitis: Secondary | ICD-10-CM

## 2014-06-19 DIAGNOSIS — Z0000 Encounter for general adult medical examination without abnormal findings: Secondary | ICD-10-CM | POA: Diagnosis not present

## 2014-06-19 DIAGNOSIS — I10 Essential (primary) hypertension: Secondary | ICD-10-CM

## 2014-06-19 DIAGNOSIS — R05 Cough: Secondary | ICD-10-CM

## 2014-06-19 DIAGNOSIS — I255 Ischemic cardiomyopathy: Secondary | ICD-10-CM

## 2014-06-19 DIAGNOSIS — J449 Chronic obstructive pulmonary disease, unspecified: Secondary | ICD-10-CM | POA: Insufficient documentation

## 2014-06-19 LAB — GLUCOSE, CAPILLARY: GLUCOSE-CAPILLARY: 154 mg/dL — AB (ref 70–99)

## 2014-06-19 LAB — POCT GLYCOSYLATED HEMOGLOBIN (HGB A1C): Hemoglobin A1C: 7.3

## 2014-06-19 MED ORDER — INSULIN ASPART 100 UNIT/ML FLEXPEN: PEN_INJECTOR | SUBCUTANEOUS | Status: DC

## 2014-06-19 MED ORDER — FLUTICASONE PROPIONATE HFA 44 MCG/ACT IN AERO: 1.0000 | INHALATION_SPRAY | Freq: Two times a day (BID) | RESPIRATORY_TRACT | Status: DC

## 2014-06-19 NOTE — Patient Instructions (Signed)
Thanks for taking your blood glucose measurements more regularly throughout the day. We have increased your NovoLog from 8 units 3 times a day to 8 units in the morning, 8 units at lunch, and 10 units with supper/dinner. Please continue taking your Lantus 20 units at night. I have also prescribed a new inhaler which should help with your cough and difficulty breathing. Please also continue taking your when you notice wheezing and shortness of breath. We will also contact you regarding a test to further evaluate for the functioning of your lungs.  General Instructions:   Please bring your medicines with you each time you come to clinic.  Medicines may include prescription medications, over-the-counter medications, herbal remedies, eye drops, vitamins, or other pills.   Progress Toward Treatment Goals:  Treatment Goal 06/19/2014  Hemoglobin A1C unable to assess  Blood pressure -  Prevent falls -    Self Care Goals & Plans:  Self Care Goal 05/09/2014  Manage my medications take my medicines as prescribed; bring my medications to every visit; refill my medications on time  Monitor my health keep track of my blood glucose; bring my glucose meter and log to each visit  Eat healthy foods eat fruit for snacks and desserts; drink diet soda or water instead of juice or soda; eat more vegetables; eat foods that are low in salt; eat baked foods instead of fried foods  Be physically active find an activity I enjoy  Meeting treatment goals -    Home Blood Glucose Monitoring 08/13/2013  Check my blood sugar 3 times a day  When to check my blood sugar before meals     Care Management & Community Referrals:  Referral 08/13/2013  Referrals made for care management support none needed  Referrals made to community resources none

## 2014-06-19 NOTE — Progress Notes (Signed)
   Subjective:    Patient ID: Bianca [name], female    DOB: November 28, 1930, 81 y.o.   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   Please see problem based charting for more details.  Review of Systems  Constitutional: Negative for fever and chills.  HENT: Negative for rhinorrhea and sore throat.   Eyes: Negative for visual disturbance.  Respiratory: Positive for cough. Negative for shortness of breath.   Cardiovascular: Negative for chest pain and palpitations.  Gastrointestinal: Negative for nausea, vomiting, abdominal pain, diarrhea, constipation and blood in stool.  Genitourinary: Negative for dysuria and hematuria.  Neurological: Negative for syncope.       Objective:   Physical Exam  Constitutional: She is oriented to person, place, and time. She appears well-developed and well-nourished. No distress.  HENT:  Head: Normocephalic and atraumatic.  Eyes: EOM are normal. Pupils are equal, round, and reactive to light. Left eye exhibits no discharge.  Neck: Normal range of motion. Neck supple. No thyromegaly present.  Cardiovascular: Normal rate and regular rhythm.  Exam reveals no gallop and no friction rub.   No murmur heard. Pulmonary/Chest: Effort normal and breath sounds normal. No respiratory distress. She has no wheezes. She has no rales.  Abdominal: Soft. Bowel sounds are normal. She exhibits no distension. There is no tenderness. There is no rebound.  Musculoskeletal: She exhibits no edema.  Neurological: She is alert and oriented to person, place, and time. No cranial nerve deficit.  Skin: Skin is warm and dry. No rash noted.  Psychiatric: She has a normal mood and affect. Thought content normal.          Assessment & Plan:  Please see problem based charting for more details.

## 2014-06-19 NOTE — Addendum Note (Signed)
Addended by: Harold BarbanNGO, Omaya Nieland on: 06/19/2014 02:41 PM   Modules accepted: Orders

## 2014-06-19 NOTE — Assessment & Plan Note (Addendum)
Lab Results  Component Value Date   HGBA1C 7.3 06/19/2014   HGBA1C 8.1* 04/21/2014   HGBA1C 7.7 12/10/2013     Assessment: Diabetes control: good control (HgbA1C at goal) Progress toward A1C goal:  improved Comments: Patient states that she is compliant with her Lantus 20 units daily at bedtime and NovoLog 8 units 3 times a day. Review of her blood glucose monitor from home reveals some elevated blood glucoses in the high 100s to low 200s consistently later in the day.  Plan: Medications:  Continue with Lantus 20 units daily at bedtime. Increase suppertime NovoLog to 10 units. Otherwise, breakfast and lunch NovoLog the same at 8 units. Home glucose monitoring: Frequency:   3 times a day Timing:   for meals

## 2014-06-19 NOTE — Assessment & Plan Note (Signed)
Patient reports a cough that is associated with wheezing. She denies any significant production from the cough. Patient denies any associated rhinorrhea, sneezing, sore throat, purulent sputum, fever, chest pain, abdominal pain. Patient is currently on an albuterol inhaler which she takes up to 2 times a day. There are no pulmonary function tests on file. Patient has no history of tobacco, though chest radiograph from December 2015 consistent with COPD/chronic changes. Patient is not currently on an ACE inhibitor. She is not currently on any NSAIDs. -Pulmonary function tests to further evaluate and establish baseline -Fluticasone inhaler as escalation therapy as it is common to both escalation algorithms for asthma and COPD.

## 2014-06-19 NOTE — Assessment & Plan Note (Signed)
TDAP administered today. Patient to defer further discussion of colonoscopy, zoster exam, eye exam until next visit.

## 2014-06-19 NOTE — Assessment & Plan Note (Signed)
BP Readings from Last 3 Encounters:  06/19/14 113/63  06/04/14 144/65  05/13/14 140/78    Lab Results  Component Value Date   NA 139 05/01/2014   K 3.9 05/01/2014   CREATININE 2.68* 05/01/2014    Assessment: Blood pressure control: controlled Progress toward BP goal:  at goal Comments: Patient states that she is compliant with her amlodipine 5 mg daily, carvedilol 25 mg twice a day, and hydralazine 25 mg 3 times a day.  Plan: Medications:  continue current medications Educational resources provided:   Self management tools provided:   Other plans: None

## 2014-06-19 NOTE — Progress Notes (Signed)
Medicine attending: Medical history, presenting problems, physical findings, and medications, reviewed with Dr Lawrence Ngo and I concur with his evaluation and management plan. 

## 2014-06-19 NOTE — Assessment & Plan Note (Signed)
Patient states that her symptoms have resolved after administration of trimethoprim polymyxin eyedrops.

## 2014-06-20 ENCOUNTER — Emergency Department (HOSPITAL_COMMUNITY): Payer: Medicare Other

## 2014-06-20 ENCOUNTER — Observation Stay (HOSPITAL_COMMUNITY)
Admission: EM | Admit: 2014-06-20 | Discharge: 2014-06-21 | Disposition: A | Discharge status: Home / Self Care | Payer: Medicare Other | Attending: Internal Medicine | Admitting: Internal Medicine

## 2014-06-20 ENCOUNTER — Encounter (HOSPITAL_COMMUNITY): Payer: Self-pay | Admitting: Emergency Medicine

## 2014-06-20 ENCOUNTER — Other Ambulatory Visit: Payer: Self-pay

## 2014-06-20 DIAGNOSIS — I5043 Acute on chronic combined systolic (congestive) and diastolic (congestive) heart failure: Secondary | ICD-10-CM

## 2014-06-20 DIAGNOSIS — I129 Hypertensive chronic kidney disease with stage 1 through stage 4 chronic kidney disease, or unspecified chronic kidney disease: Secondary | ICD-10-CM | POA: Insufficient documentation

## 2014-06-20 DIAGNOSIS — E1122 Type 2 diabetes mellitus with diabetic chronic kidney disease: Secondary | ICD-10-CM

## 2014-06-20 DIAGNOSIS — R05 Cough: Secondary | ICD-10-CM | POA: Insufficient documentation

## 2014-06-20 DIAGNOSIS — R778 Other specified abnormalities of plasma proteins: Secondary | ICD-10-CM | POA: Diagnosis present

## 2014-06-20 DIAGNOSIS — R0600 Dyspnea, unspecified: Secondary | ICD-10-CM

## 2014-06-20 DIAGNOSIS — Z951 Presence of aortocoronary bypass graft: Secondary | ICD-10-CM | POA: Diagnosis not present

## 2014-06-20 DIAGNOSIS — I200 Unstable angina: Secondary | ICD-10-CM

## 2014-06-20 DIAGNOSIS — I25110 Atherosclerotic heart disease of native coronary artery with unstable angina pectoris: Secondary | ICD-10-CM

## 2014-06-20 DIAGNOSIS — N184 Chronic kidney disease, stage 4 (severe): Secondary | ICD-10-CM | POA: Diagnosis not present

## 2014-06-20 DIAGNOSIS — Z794 Long term (current) use of insulin: Secondary | ICD-10-CM | POA: Insufficient documentation

## 2014-06-20 DIAGNOSIS — Z7982 Long term (current) use of aspirin: Secondary | ICD-10-CM | POA: Insufficient documentation

## 2014-06-20 DIAGNOSIS — R079 Chest pain, unspecified: Principal | ICD-10-CM | POA: Diagnosis present

## 2014-06-20 DIAGNOSIS — K219 Gastro-esophageal reflux disease without esophagitis: Secondary | ICD-10-CM | POA: Diagnosis present

## 2014-06-20 DIAGNOSIS — I10 Essential (primary) hypertension: Secondary | ICD-10-CM | POA: Diagnosis present

## 2014-06-20 DIAGNOSIS — E785 Hyperlipidemia, unspecified: Secondary | ICD-10-CM | POA: Diagnosis not present

## 2014-06-20 DIAGNOSIS — N189 Chronic kidney disease, unspecified: Secondary | ICD-10-CM

## 2014-06-20 DIAGNOSIS — E119 Type 2 diabetes mellitus without complications: Secondary | ICD-10-CM | POA: Diagnosis not present

## 2014-06-20 DIAGNOSIS — R7989 Other specified abnormal findings of blood chemistry: Secondary | ICD-10-CM | POA: Diagnosis present

## 2014-06-20 DIAGNOSIS — E1169 Type 2 diabetes mellitus with other specified complication: Secondary | ICD-10-CM | POA: Diagnosis present

## 2014-06-20 DIAGNOSIS — I2510 Atherosclerotic heart disease of native coronary artery without angina pectoris: Secondary | ICD-10-CM | POA: Insufficient documentation

## 2014-06-20 DIAGNOSIS — I5042 Chronic combined systolic (congestive) and diastolic (congestive) heart failure: Secondary | ICD-10-CM | POA: Diagnosis not present

## 2014-06-20 DIAGNOSIS — N185 Chronic kidney disease, stage 5: Secondary | ICD-10-CM | POA: Diagnosis present

## 2014-06-20 LAB — CBC WITH DIFFERENTIAL/PLATELET
BASOS ABS: 0 10*3/uL (ref 0.0–0.1)
Basophils Relative: 0 % (ref 0–1)
EOS ABS: 0.1 10*3/uL (ref 0.0–0.7)
EOS PCT: 2 % (ref 0–5)
HEMATOCRIT: 30.7 % — AB (ref 36.0–46.0)
Hemoglobin: 10 g/dL — ABNORMAL LOW (ref 12.0–15.0)
LYMPHS PCT: 16 % (ref 12–46)
Lymphs Abs: 0.8 10*3/uL (ref 0.7–4.0)
MCH: 30.1 pg (ref 26.0–34.0)
MCHC: 32.6 g/dL (ref 30.0–36.0)
MCV: 92.5 fL (ref 78.0–100.0)
MONO ABS: 0.5 10*3/uL (ref 0.1–1.0)
Monocytes Relative: 10 % (ref 3–12)
Neutro Abs: 3.6 10*3/uL (ref 1.7–7.7)
Neutrophils Relative %: 72 % (ref 43–77)
Platelets: 100 10*3/uL — ABNORMAL LOW (ref 150–400)
RBC: 3.32 MIL/uL — ABNORMAL LOW (ref 3.87–5.11)
RDW: 14.1 % (ref 11.5–15.5)
WBC: 5 10*3/uL (ref 4.0–10.5)

## 2014-06-20 LAB — BASIC METABOLIC PANEL
Anion gap: 8 (ref 5–15)
BUN: 40 mg/dL — ABNORMAL HIGH (ref 6–23)
CO2: 18 mmol/L — ABNORMAL LOW (ref 19–32)
Calcium: 8.9 mg/dL (ref 8.4–10.5)
Chloride: 108 mmol/L (ref 96–112)
Creatinine, Ser: 2.91 mg/dL — ABNORMAL HIGH (ref 0.50–1.10)
GFR calc Af Amer: 16 mL/min — ABNORMAL LOW (ref >90)
GFR, EST NON AFRICAN AMERICAN: 14 mL/min — AB (ref >90)
Glucose, Bld: 226 mg/dL — ABNORMAL HIGH (ref 70–99)
Potassium: 4.3 mmol/L (ref 3.5–5.1)
SODIUM: 134 mmol/L — AB (ref 135–145)

## 2014-06-20 LAB — CBG MONITORING, ED: GLUCOSE-CAPILLARY: 205 mg/dL — AB (ref 70–99)

## 2014-06-20 LAB — TROPONIN I
Troponin I: <0.03 ng/mL (ref <0.031)
Troponin I: <0.03 ng/mL (ref <0.031)

## 2014-06-20 LAB — GLUCOSE, CAPILLARY
GLUCOSE-CAPILLARY: 133 mg/dL — AB (ref 70–99)
Glucose-Capillary: 103 mg/dL — ABNORMAL HIGH (ref 70–99)

## 2014-06-20 LAB — I-STAT TROPONIN, ED: Troponin i, poc: 0.19 ng/mL (ref 0.00–0.08)

## 2014-06-20 LAB — HEPARIN LEVEL (UNFRACTIONATED): HEPARIN UNFRACTIONATED: 0.67 [IU]/mL (ref 0.30–0.70)

## 2014-06-20 LAB — PHOSPHORUS: Phosphorus: 3.6 mg/dL (ref 2.3–4.6)

## 2014-06-20 MED ORDER — INSULIN ASPART 100 UNIT/ML ~~LOC~~ SOLN: 0.0000 [IU] | Freq: Every day | SUBCUTANEOUS | Status: DC

## 2014-06-20 MED ORDER — SODIUM CHLORIDE 0.9 % IV SOLN: Freq: Once | INTRAVENOUS | Status: DC

## 2014-06-20 MED ORDER — ASPIRIN 81 MG PO CHEW
324.0000 mg | CHEWABLE_TABLET | Freq: Once | ORAL | Status: AC
  Administered 2014-06-20: 324 mg via ORAL
  Filled 2014-06-20: qty 4

## 2014-06-20 MED ORDER — HEPARIN SODIUM (PORCINE) 5000 UNIT/ML IJ SOLN
5000.0000 [IU] | Freq: Three times a day (TID) | INTRAMUSCULAR | Status: DC
  Administered 2014-06-20 – 2014-06-21 (×2): 5000 [IU] via SUBCUTANEOUS
  Filled 2014-06-20 (×3): qty 1

## 2014-06-20 MED ORDER — INSULIN ASPART 100 UNIT/ML ~~LOC~~ SOLN
0.0000 [IU] | Freq: Three times a day (TID) | SUBCUTANEOUS | Status: DC
  Administered 2014-06-20: 1 [IU] via SUBCUTANEOUS

## 2014-06-20 MED ORDER — ACETAMINOPHEN 325 MG PO TABS: 650.0000 mg | ORAL_TABLET | ORAL | Status: DC | PRN

## 2014-06-20 MED ORDER — HYDRALAZINE HCL 25 MG PO TABS
25.0000 mg | ORAL_TABLET | Freq: Three times a day (TID) | ORAL | Status: DC
  Administered 2014-06-20 – 2014-06-21 (×3): 25 mg via ORAL
  Filled 2014-06-20 (×5): qty 1

## 2014-06-20 MED ORDER — HEPARIN (PORCINE) IN NACL 100-0.45 UNIT/ML-% IJ SOLN
700.0000 [IU]/h | INTRAMUSCULAR | Status: DC
  Administered 2014-06-20: 700 [IU]/h via INTRAVENOUS
  Filled 2014-06-20: qty 250

## 2014-06-20 MED ORDER — CARVEDILOL 25 MG PO TABS
25.0000 mg | ORAL_TABLET | Freq: Two times a day (BID) | ORAL | Status: DC
  Administered 2014-06-20 – 2014-06-21 (×3): 25 mg via ORAL
  Filled 2014-06-20 (×7): qty 1

## 2014-06-20 MED ORDER — AMLODIPINE BESYLATE 10 MG PO TABS
10.0000 mg | ORAL_TABLET | Freq: Every day | ORAL | Status: DC
  Administered 2014-06-20 – 2014-06-21 (×2): 10 mg via ORAL
  Filled 2014-06-20: qty 1
  Filled 2014-06-20: qty 2

## 2014-06-20 MED ORDER — FLUTICASONE PROPIONATE HFA 44 MCG/ACT IN AERO
1.0000 | INHALATION_SPRAY | Freq: Two times a day (BID) | RESPIRATORY_TRACT | Status: DC
  Administered 2014-06-20 – 2014-06-21 (×2): 1 via RESPIRATORY_TRACT
  Filled 2014-06-20: qty 10.6

## 2014-06-20 MED ORDER — NITROGLYCERIN IN D5W 200-5 MCG/ML-% IV SOLN
5.0000 ug/min | INTRAVENOUS | Status: DC
  Administered 2014-06-20: 5 ug/min via INTRAVENOUS
  Filled 2014-06-20: qty 250

## 2014-06-20 MED ORDER — ASPIRIN EC 81 MG PO TBEC
81.0000 mg | DELAYED_RELEASE_TABLET | Freq: Every day | ORAL | Status: DC
Start: 2014-06-21 — End: 2014-06-21
  Administered 2014-06-21: 81 mg via ORAL
  Filled 2014-06-20: qty 1

## 2014-06-20 MED ORDER — ISOSORBIDE MONONITRATE ER 60 MG PO TB24
120.0000 mg | ORAL_TABLET | Freq: Every day | ORAL | Status: DC
  Administered 2014-06-20 – 2014-06-21 (×2): 120 mg via ORAL
  Filled 2014-06-20 (×2): qty 2

## 2014-06-20 MED ORDER — ALBUTEROL SULFATE (2.5 MG/3ML) 0.083% IN NEBU: 2.5000 mg | INHALATION_SOLUTION | Freq: Four times a day (QID) | RESPIRATORY_TRACT | Status: DC | PRN

## 2014-06-20 MED ORDER — ALBUTEROL SULFATE (2.5 MG/3ML) 0.083% IN NEBU
5.0000 mg | INHALATION_SOLUTION | Freq: Once | RESPIRATORY_TRACT | Status: AC
  Administered 2014-06-20: 5 mg via RESPIRATORY_TRACT
  Filled 2014-06-20: qty 6

## 2014-06-20 MED ORDER — ROSUVASTATIN CALCIUM 20 MG PO TABS
20.0000 mg | ORAL_TABLET | Freq: Every day | ORAL | Status: DC
  Administered 2014-06-20: 20 mg via ORAL
  Filled 2014-06-20 (×3): qty 1

## 2014-06-20 MED ORDER — PANTOPRAZOLE SODIUM 40 MG PO TBEC
40.0000 mg | DELAYED_RELEASE_TABLET | Freq: Every day | ORAL | Status: DC
  Administered 2014-06-20 – 2014-06-21 (×2): 40 mg via ORAL
  Filled 2014-06-20 (×2): qty 1

## 2014-06-20 MED ORDER — DOCUSATE SODIUM 100 MG PO CAPS
100.0000 mg | ORAL_CAPSULE | Freq: Two times a day (BID) | ORAL | Status: DC
  Administered 2014-06-20 – 2014-06-21 (×2): 100 mg via ORAL
  Filled 2014-06-20 (×3): qty 1

## 2014-06-20 MED ORDER — HEPARIN BOLUS VIA INFUSION
2000.0000 [IU] | Freq: Once | INTRAVENOUS | Status: AC
  Administered 2014-06-20: 2000 [IU] via INTRAVENOUS
  Filled 2014-06-20: qty 2000

## 2014-06-20 NOTE — Consult Note (Signed)
Reason for Consult: Chest pain  Requesting Physician: Hyacinth MeekerMiller  Cardiologist: Mayford Knifeurner  HPI: The patient speaks only [place], her family members serve as interpreters. Known to me from recent hospitalization  This is a 81 y.o. female with a past medical history significant for extensive coronary artery disease status post CABG 2008(cardiac cath 2012 patent LIMA to LAD, occluded SVG to diagonal, occluded SVG to OM 2, occluded proximal LAD, occluded proximal RCA), predominantly diastolic heart failure (left ventricular ejection fraction 40-45% by echo in December) with an extensive inferior wall scar by nuclear scintigraphy, but without reversible ischemia. She was hospitalized in December with congestive heart failure and unstable angina. She has advanced chronic kidney disease with an estimated GFR of only 14 mL/minute (baseline creatinine around 2.9) and has diabetes mellitus requiring insulin therapy.  She was recently seen as an outpatient and felt to be stable, albeit with a slightly elevated blood pressure. Last night she woke up around 4:00 in the morning with uncontrollable coughing and shortness of breath. It sounds like she was probably orthopneic although it is hard to tell with the language barrier. Although I'm not sure I understand the nuances of her symptoms, to me it appears that chest pain was not at all a complaint last night. Pain in the middle of her chest is described as a complaint in the emergency room notes. Her blood pressure was markedly elevated when she arrived to the emergency room. It seemed that she improved after initiation of intravenous heparin and intravenous nitroglycerin. The point of care troponin was nominally elevated but the labs troponin is actually undetectable. Her echocardiogram shows inferior Q waves and nonspecific diffuse T-wave changes, unchanged from previous tracings.  Right now she looks comfortable lying completely flat in bed and appears to  be asymptomatic.  PMHx:  Past Medical History  Diagnosis Date  . Diabetes mellitus, type 2   . GERD (gastroesophageal reflux disease)   . Hepatitis B   . Hyperlipemia   . Hypertension   . Cervical dysplasia   . Atrophic vaginitis   . Spinal stenosis     s/p decompression  . CAD (coronary artery disease) April 2008    s/p CABG;    cath 8/12:  LM patent, pLAD occluded, OM1 30-40%, pOM2 80% and 60% after the anastomosis, pRCA occluded, S-DX occluded (old), S-OM 2 occluded (new), L-LAD patent, dLAD provided collats to the PDA; Med Rx  rec.; consider PCI to OM2 if fails med Rx  . Hx of hysterectomy   . UTI (lower urinary tract infection)   . Diverticulosis of intestine 04/29/2014    Of ascending colon noted on CT abd/ pelvis 04/20/14   Past Surgical History  Procedure Laterality Date  . Back surgery      decompression L3-S1  . Coronary artery bypass graft  April 2008  . Total abdominal hysterectomy      cervical dysplasia  . Esophagogastroduodenoscopy Left 04/22/2014    Procedure: ESOPHAGOGASTRODUODENOSCOPY (EGD);  Surgeon: Shirley FriarVincent C. Schooler, MD;  [facility];  Service: Endoscopy;  Laterality: Left;    FAMHx: History reviewed. No pertinent family history.  SOCHx:  reports that she has never smoked. She has never used smokeless tobacco. She reports that she does not drink alcohol or use illicit drugs.  ALLERGIES: Allergies  Allergen Reactions  . Cozaar Other (See Comments)    Sig increase in her creatinine that resolved once it was D/C'd  Per Cr visible in epic, it seems her  baseline Cr = 1.7-1.9, and the slight improvement to Cr was an outlier when compared to recent Cr.  We will do a trial of Lisinopril (and monitor for 30% rise in Cr), as she would greatly benefit from renal protection.  . Lisinopril     Cough     ROS: Limited by language barrier The patient specifically denies syncope, palpitations, focal neurological deficits, intermittent claudication,  lower extremity edema, unexplained weight gain, cough, hemoptysis or wheezing, fever or chills.   HOME MEDICATIONS: No current facility-administered medications on file prior to encounter.   Current Outpatient Prescriptions on File Prior to Encounter  Medication Sig Dispense Refill  . acetaminophen (TYLENOL) 500 MG tablet Take 2 tablets (1,000 mg total) by mouth every 8 (eight) hours as needed for mild pain. 100 tablet 2  . amLODipine (NORVASC) 5 MG tablet Take 1 tablet (5 mg total) by mouth daily. 30 tablet 11  . aspirin EC 81 MG tablet TAKE 1 TABLET BY MOUTH EVERY DAY 150 tablet 1  . carvedilol (COREG) 25 MG tablet TAKE 1 TABLET (25 MG TOTAL) BY MOUTH 2 (TWO) TIMES DAILY WITH A MEAL. 120 tablet 3  . CRESTOR 20 MG tablet TAKE 1 TABLET (20 MG TOTAL) BY MOUTH DAILY. 90 tablet 3  . fluticasone (FLOVENT HFA) 44 MCG/ACT inhaler Inhale 1 puff into the lungs 2 (two) times daily. 1 Inhaler 12  . hydrALAZINE (APRESOLINE) 25 MG tablet Take 1 tablet (25 mg total) by mouth 3 (three) times daily. 270 tablet 5  . insulin aspart (NOVOLOG FLEXPEN) 100 UNIT/ML FlexPen Please take 8 units with breakfast, 8 units with lunch, and 10 units with dinner/supper 100 mL 9  . isosorbide mononitrate (IMDUR) 60 MG 24 hr tablet Take 1 tablet (60 mg total) by mouth daily. 30 tablet 11  . LANTUS 100 UNIT/ML injection INJECT 0.2 MLS (20 UNITS TOTAL) INTO THE SKIN AT BEDTIME. 10 mL 5  . nitroGLYCERIN (NITROSTAT) 0.4 MG SL tablet Place 0.4 mg under the tongue every 5 (five) minutes as needed. For chest pain. Maximum of 3 doses    . PROAIR HFA 108 (90 BASE) MCG/ACT inhaler INHALE 1 PUFF EVERY 6 HOURS AS NEEDED FOR WHEEZE 8.5 each 2  . trimethoprim-polymyxin b (POLYTRIM) ophthalmic solution Place 1 drop into both eyes every 6 (six) hours. 10 mL 0  . B-D ULTRAFINE III SHORT PEN 31G X 8 MM MISC USE AS DIRECTED TO INJECT INSULIN 3 TIMES A DAY 100 each 6  . calcitRIOL (ROCALTROL) 0.25 MCG capsule Take 2 capsules (0.5 mcg total) by  mouth daily. (Patient not taking: Reported on 06/20/2014) 60 capsule 5  . calcium citrate-vitamin D (CITRACAL+D) 315-200 MG-UNIT per tablet Take 2 tablets by mouth 2 (two) times daily. (Patient not taking: Reported on 06/20/2014) 100 tablet 3  . glucose blood (BAYER CONTOUR NEXT TEST) test strip 1 each by Other route as needed for other. Use 4 to 5 times daily to check blood sugar. diag code E11.9. Insulin dependent 125 each 11  . sennosides-docusate sodium (SENOKOT-S) 8.6-50 MG tablet Take 1 tablet by mouth daily. (Patient not taking: Reported on 06/20/2014) 90 tablet 3     HOSPITAL MEDICATIONS: I have reviewed the patient's current medications.  VITALS: Blood pressure 187/94, pulse 81, temperature 98.1 F (36.7 C), temperature source Oral, resp. rate 20, height  (1.549 m), weight 48.7 kg (107 lb 5.8 oz), SpO2 100 %.  PHYSICAL EXAM:  General: Alert, oriented x3, no distress Head: no evidence of trauma,  PERRL, EOMI, no exophtalmos or lid lag, no myxedema, no xanthelasma; normal ears, nose and oropharynx Neck: 3-4 centimeters elevation in jugular venous pulsations and no hepatojugular reflux; brisk carotid pulses without delay and no carotid bruits Chest: clear to auscultation, no signs of consolidation by percussion or palpation, normal fremitus, symmetrical and full respiratory excursions Cardiovascular: normal position and quality of the apical impulse, regular rhythm, normal first heart sound and normal second heart sound, no rubs or gallops, 1-2/6 holosystolic left lower sternal border murmur Abdomen: no tenderness or distention, no masses by palpation, no abnormal pulsatility or arterial bruits, normal bowel sounds, no hepatosplenomegaly Extremities: no clubbing, cyanosis;  no edema; 2+ radial, ulnar and brachial pulses bilaterally; 2+ right femoral, posterior tibial and dorsalis pedis pulses; 2+ left femoral, posterior tibial and dorsalis pedis pulses; no subclavian or femoral  bruits Neurological: grossly nonfocal   LABS  CBC  Recent Labs  06/20/14 0600  WBC 5.0  NEUTROABS 3.6  HGB 10.0*  HCT 30.7*  MCV 92.5  PLT 100*   Basic Metabolic Panel  Recent Labs  06/20/14 0600  NA 134*  K 4.3  CL 108  CO2 18*  GLUCOSE 226*  BUN 40*  CREATININE 2.91*  CALCIUM 8.9   Liver Function Tests No results for input(s): AST, ALT, ALKPHOS, BILITOT, PROT, ALBUMIN in the last 72 hours. No results for input(s): LIPASE, AMYLASE in the last 72 hours. Cardiac Enzymes  Recent Labs  06/20/14 0600  TROPONINI <0.03   BNP Invalid input(s): POCBNP D-Dimer No results for input(s): DDIMER in the last 72 hours. Hemoglobin A1C  Recent Labs  06/19/14 1407  HGBA1C 7.3   Fasting Lipid Panel No results for input(s): CHOL, HDL, LDLCALC, TRIG, CHOLHDL, LDLDIRECT in the last 72 hours. Thyroid Function Tests No results for input(s): TSH, T4TOTAL, T3FREE, THYROIDAB in the last 72 hours.  Invalid input(s): FREET3    IMAGING: Dg Chest 2 View  06/20/2014   CLINICAL DATA:  Chest pain and dyspnea  EXAM: CHEST  2 VIEW  COMPARISON:  04/23/2014  FINDINGS: There is prior sternotomy and CABG. Heart size is upper normal, unchanged. There is chronic appearing interstitial coarsening. No airspace opacities are evident. There are no pleural effusions.  IMPRESSION: No acute cardiopulmonary findings   Electronically Signed   By: Ellery Plunk M.D.   On: 06/20/2014 06:33    ECG: Sinus rhythm, inferior Q waves, diffuse nonspecific repolarization changes  TELEMETRY:  sinus rhythm  IMPRESSION: 1. Paroxysmal nocturnal dyspnea consistent with acute exacerbation of heart failure, predominantly diastolic dysfunction; she does not appear to be overtly hypervolemic and I suspect her heart failure exacerbation is related to coronary ischemia and elevation in blood pressure 2. Extensive CAD of both native and graft vessels, without reversible ischemia by recent nuclear stress test  and with large inferior wall scar; unlikely to have good options for revascularization percutaneously, not a good candidate for redo coronary bypass surgery 3. Severe chronic kidney disease, approaching end-stage renal disease; cardiac catheterization with calm with an extremely high risk of permanent renal failure and is likely to show few if any opportunities for meaningful revascularization 4. Type 2 diabetes mellitus requiring insulin 5. Hypertension, not well controlled  RECOMMENDATION: 1. Medical management focusing on keeping her systolic blood pressure around 130 or less, preferentially using beta blockers and amlodipine and nitrates. She is already on a high dose of carvedilol. We'll increase amlodipine and isosorbide mononitrate as we wean off nitroglycerin. 2. No plan at this time for  coronary angiography; would only consider angiography if she is having a clearcut acute ST elevation myocardial infarction since otherwise the risks clearly outweigh the potential benefits 3. Does not appear to be overtly hypervolemic, diuretics can be used cautiously with monitoring of renal function. I do think she will require a low-dose of loop diuretic chronically  Time Spent Directly with Patient: 60 minutes  Thurmon Fair, MD, [facility] [phone] office [phone] pager   06/20/2014, 8:34 AM

## 2014-06-20 NOTE — ED Notes (Signed)
Pt reports cp and sob starting today in mid chest radiating to left arm.  Pt reports weakness, and has hx of open heart surgery years ago.  Pt alert and oriented.  Lung sounds clear.

## 2014-06-20 NOTE — ED Provider Notes (Signed)
The patient is an elderly 81 year old female, she has a history of lung disease requiring bronchodilator therapy, had been to her family doctor yesterday for a well visit and was feeling well until approximately 4:00 in the morning when she woke her daughter up stating that she had been coughing all night and felt nauseated and generally weak with no energy. She denies fevers chills vomiting diarrhea rashes or swelling. On exam the patient has clear heart and lung sounds, there is no wheezing, no respiratory distress and no increased work of breathing. She has a soft abdomen with minimal tenderness in the left upper quadrant and left mid abdomen but no tenderness on the right. She has no peripheral edema, normal mucous membranes without any signs of erythema.  The patient will need evaluation for the source of her symptoms, she does have known heart disease, would also consider infectious etiologies, metabolic etiologies, EKG without acute findings.  ED ECG REPORT  I personally interpreted this EKG   Date: 06/24/2014   Rate: 82  Rhythm: normal sinus rhythm  QRS Axis: normal  Intervals: normal  ST/T Wave abnormalities: normal  Conduction Disutrbances:none  Narrative Interpretation:   Old EKG Reviewed: none available   Medical screening examination/treatment/procedure(s) were conducted as a shared visit with non-physician practitioner(s) and myself.  I personally evaluated the patient during the encounter.  Clinical Impression:   Final diagnoses:  Chest pain, unspecified chest pain type          Vida RollerBrian D Javonnie Illescas, MD 06/24/14 1755

## 2014-06-20 NOTE — H&P (Signed)
Date: 06/20/2014               Patient Name:  Bianca [name] MRN: [id number]  DOB: 1931/04/12 Age / Sex: 81 y.o., female   PCP: Rich Number, MD         Medical Service: Internal Medicine Teaching Service         Attending Physician: Dr. Burns Spain, MD    First Contact: Dr. Beckie Salts Pager: [phone]  Second Contact: Dr. Johna Roles Pager: [phone]       After Hours (After 5p/  First Contact Pager: [phone]  weekends / holidays): Second Contact Pager: [phone]   Chief Complaint: Chest pain  History of Present Illness: 81yo F w/ PMH cCHF, CKD 4, DM2, HTN, and CAD s/p CABG '08 presents with acute onset chest pain. The patient does not speak [place], but her daughter, who does speak English, was present during the exam and was able to translate. The patient was up all night coughing per the daughter, and got up around 4am to use the bathroom when she began to experience sharp, non-radiating, mid-sternal chest pain with palpitations and shortness of breath. The pain lasted 10-20 minutes and is not reproducible with palpation.   Her blood pressure was also elevated on arrival to the ED, with SBP in 200s per EDP. Nitro drip was started and SBP improved to 160s.   EKG without changes from previous EKG, and shows inferior Q waves with nonspecific T-wave changes. poc troponin 0.19 but serum troponin <0.03. Cardiology was consulted and recommends better BP control, continue heparin drip, and trend troponin. Holding on intervention at this time.    Meds: Current Facility-Administered Medications  Medication Dose Route Frequency Provider Last Rate Last Dose  . amLODipine (NORVASC) tablet 10 mg  10 mg Oral Daily Mihai Croitoru, MD      . carvedilol (COREG) tablet 25 mg  25 mg Oral BID WC Mihai Croitoru, MD      . heparin ADULT infusion 100 units/mL (25000 units/250 mL)  700 Units/hr Intravenous Continuous Colleen Can, [facility] 7 mL/hr at 06/20/14 0655 700 Units/hr at 06/20/14 0655  . isosorbide  mononitrate (IMDUR) 24 hr tablet 120 mg  120 mg Oral Daily Mihai Croitoru, MD      . nitroGLYCERIN 50 mg in dextrose 5 % 250 mL (0.2 mg/mL) infusion  5 mcg/min Intravenous Titrated Renne Crigler, PA-C 1.5 mL/hr at 06/20/14 4132 5 mcg/min at 06/20/14 4401   Current Outpatient Prescriptions  Medication Sig Dispense Refill  . acetaminophen (TYLENOL) 500 MG tablet Take 2 tablets (1,000 mg total) by mouth every 8 (eight) hours as needed for mild pain. 100 tablet 2  . amLODipine (NORVASC) 5 MG tablet Take 1 tablet (5 mg total) by mouth daily. 30 tablet 11  . aspirin EC 81 MG tablet TAKE 1 TABLET BY MOUTH EVERY DAY 150 tablet 1  . carvedilol (COREG) 25 MG tablet TAKE 1 TABLET (25 MG TOTAL) BY MOUTH 2 (TWO) TIMES DAILY WITH A MEAL. 120 tablet 3  . CRESTOR 20 MG tablet TAKE 1 TABLET (20 MG TOTAL) BY MOUTH DAILY. 90 tablet 3  . docusate sodium (COLACE) 100 MG capsule Take 100 mg by mouth 2 (two) times daily.    . fluticasone (FLOVENT HFA) 44 MCG/ACT inhaler Inhale 1 puff into the lungs 2 (two) times daily. 1 Inhaler 12  . hydrALAZINE (APRESOLINE) 25 MG tablet Take 1 tablet (25 mg total) by mouth 3 (three) times daily. 270 tablet 5  .  insulin aspart (NOVOLOG FLEXPEN) 100 UNIT/ML FlexPen Please take 8 units with breakfast, 8 units with lunch, and 10 units with dinner/supper 100 mL 9  . isosorbide mononitrate (IMDUR) 60 MG 24 hr tablet Take 1 tablet (60 mg total) by mouth daily. 30 tablet 11  . LANTUS 100 UNIT/ML injection INJECT 0.2 MLS (20 UNITS TOTAL) INTO THE SKIN AT BEDTIME. 10 mL 5  . nitroGLYCERIN (NITROSTAT) 0.4 MG SL tablet Place 0.4 mg under the tongue every 5 (five) minutes as needed. For chest pain. Maximum of 3 doses    . PROAIR HFA 108 (90 BASE) MCG/ACT inhaler INHALE 1 PUFF EVERY 6 HOURS AS NEEDED FOR WHEEZE 8.5 each 2  . trimethoprim-polymyxin b (POLYTRIM) ophthalmic solution Place 1 drop into both eyes every 6 (six) hours. 10 mL 0  . B-D ULTRAFINE III SHORT PEN 31G X 8 MM MISC USE AS DIRECTED  TO INJECT INSULIN 3 TIMES A DAY 100 each 6  . calcitRIOL (ROCALTROL) 0.25 MCG capsule Take 2 capsules (0.5 mcg total) by mouth daily. (Patient not taking: Reported on 06/20/2014) 60 capsule 5  . calcium citrate-vitamin D (CITRACAL+D) 315-200 MG-UNIT per tablet Take 2 tablets by mouth 2 (two) times daily. (Patient not taking: Reported on 06/20/2014) 100 tablet 3  . glucose blood (BAYER CONTOUR NEXT TEST) test strip 1 each by Other route as needed for other. Use 4 to 5 times daily to check blood sugar. diag code E11.9. Insulin dependent 125 each 11  . sennosides-docusate sodium (SENOKOT-S) 8.6-50 MG tablet Take 1 tablet by mouth daily. (Patient not taking: Reported on 06/20/2014) 90 tablet 3    Allergies: Allergies as of 06/20/2014 - Review Complete 06/20/2014  Allergen Reaction Noted  . Cozaar Other (See Comments) 02/12/2011  . Lisinopril  05/24/2012   Past Medical History  Diagnosis Date  . Diabetes mellitus, type 2   . GERD (gastroesophageal reflux disease)   . Hepatitis B   . Hyperlipemia   . Hypertension   . Cervical dysplasia   . Atrophic vaginitis   . Spinal stenosis     s/p decompression  . CAD (coronary artery disease) April 2008    s/p CABG;    cath 8/12:  LM patent, pLAD occluded, OM1 30-40%, pOM2 80% and 60% after the anastomosis, pRCA occluded, S-DX occluded (old), S-OM 2 occluded (new), L-LAD patent, dLAD provided collats to the PDA; Med Rx  rec.; consider PCI to OM2 if fails med Rx  . Hx of hysterectomy   . UTI (lower urinary tract infection)   . Diverticulosis of intestine 04/29/2014    Of ascending colon noted on CT abd/ pelvis 04/20/14   Past Surgical History  Procedure Laterality Date  . Back surgery      decompression L3-S1  . Coronary artery bypass graft  April 2008  . Total abdominal hysterectomy      cervical dysplasia  . Esophagogastroduodenoscopy Left 04/22/2014    Procedure: ESOPHAGOGASTRODUODENOSCOPY (EGD);  Surgeon: Shirley Friar, MD;  Location: [facility]  ENDOSCOPY;  Service: Endoscopy;  Laterality: Left;   History reviewed. No pertinent family history. History   Social History  . Marital Status: Widowed    Spouse Name: N/A  . Number of Children: N/A  . Years of Education: N/A   Occupational History  . Not on file.   Social History Main Topics  . Smoking status: Never Smoker   . Smokeless tobacco: Never Used  . Alcohol Use: No  . Drug Use: No  . Sexual Activity:  Not on file   Other Topics Concern  . Not on file   Social History Narrative   Chinese immigrant   Housewife   Widowed   Lives w/ her son    Review of Systems: A 12 point ROS was performed; pertinent positives and negatives are noted below: Constitutional: Denies fever, chills, +fatigue.  Respiratory: +SOB, cough, chest tightness.  Cardiovascular: +chest pain, palpitations. Denies leg swelling.  Gastrointestinal: Denies nausea, vomiting, abdominal pain Genitourinary: Denies dysuria Musculoskeletal: Denies myalgias Neurological: Denies syncope   Physical Exam: Blood pressure 187/94, pulse 81, temperature 98.1 F (36.7 C), temperature source Oral, resp. rate 20, height 5\' 1"  (1.549 m), weight 107 lb 5.8 oz (48.7 kg), SpO2 100 %. General: NAD, lying in bed, and cooperative on examination.  Head: Normocephalic and atraumatic.  Eyes: EOMI Mouth: Pharynx moist Neck: Supple, full ROM, no JVD, and no carotid bruits.  Lungs: CTAB, normal respiratory effort, no accessory muscle use, no crackles, and no wheezes. Heart: Regular rate, regular rhythm, no murmur appreciated.  Abdomen: Soft, non-tender, non-distended, normal bowel sounds, no guarding, no rebound tenderness, no organomegaly.  Msk: No joint swelling  Extremities:  2+DP pulses bilaterally. No cyanosis, clubbing, edema Neurologic: Alert & oriented X3, cranial nerves II-XII grossly intact, sensation intact to light touch, moves all 4 extremities.  Psych: Normal mood and affect.    Lab results: Basic  Metabolic Panel:  Recent Labs  16/01/9601/18/16 0600  NA 134*  K 4.3  CL 108  CO2 18*  GLUCOSE 226*  BUN 40*  CREATININE 2.91*  CALCIUM 8.9   CBC:  Recent Labs  06/20/14 0600  WBC 5.0  NEUTROABS 3.6  HGB 10.0*  HCT 30.7*  MCV 92.5  PLT 100*   Cardiac Enzymes:  Recent Labs  06/20/14 0600  TROPONINI <0.03   CBG:  Recent Labs  06/19/14 1359 06/20/14 0557  GLUCAP 154* 205*   Hemoglobin A1C:  Recent Labs  06/19/14 1407  HGBA1C 7.3    Imaging results:  Dg Chest 2 View  06/20/2014   CLINICAL DATA:  Chest pain and dyspnea  EXAM: CHEST  2 VIEW  COMPARISON:  04/23/2014  FINDINGS: There is prior sternotomy and CABG. Heart size is upper normal, unchanged. There is chronic appearing interstitial coarsening. No airspace opacities are evident. There are no pleural effusions.  IMPRESSION: No acute cardiopulmonary findings   Electronically Signed   By: Ellery Plunkaniel R Mitchell M.D.   On: 06/20/2014 06:33    Other results: EKG: Sinus rhythm with right axis deviation. Nonspecific T wave changes. No significant change from previous EKG.   Assessment & Plan by Problem: 81yo F w/ PMH cCHF, CKD 4, DM2, HTN, and CAD s/p CABG '08 presents with acute onset chest pain.   #Chest pain: Acute onset chest pain in the setting of cough. CXR without acute findings. Does not appear to be fluid overloaded, so CHF exacerbation is less likely. Pt with h/o CAD, s/p CABG '08, uncontrolled HTN, and DM2, so ACS is a concern. Last cath '12 with patent LIMA to LAD, occluded SVG to diagonal, occluded SVG to OM 2, occluded proximal LAD, occluded proximal RCA. TIMI and HEART score 4-5. Cardiology was consulted and recommends medical management at this time with improved BP control to keep SBP 130 or less using BB, CCB, and nitrates. - Admit to IMTS to tele - Continue home carvedilol 25mg  BID - Increase amlodipine to 10mg  and Imdur 120mg  daily while weaning off nitro drip, per Cardiology - Trend troponin  q6 x3 -  Continue lipid and CBG control. - Vitals per routine  - F/u with Cardiology, appreciate recommendations   #Hyperlipidemia: Last lipid panel 2/9 with elevated total cholesterol 218, LDL 106, HDL 65, and TG 235. She is on a statin at home, which was continued this admission.  - Continue Crestor  daily  #Essential hypertension: BP moderately elevated at clinci visit 2/17. In the ED, SBP reportedly >200. Nitro drip was started and SBP down to 160. Cardiology is increasing home CCB and nitrate and weaning nitro drip.  - Continue Coreg  BID, hydralazine  TID - Increase Norvasc to  daily - Increase Imdur to  qday - Weaning nitro drip  #Chronic renal failure, stage 4 (severe): Stable. Likely 2/2 DM and HTN. Renal u/s in the past showed only simple cysts and chronic medical disease. Renal function at baseline w/ Cr 2.91. Pt not on HD and still makes urine. Ca 8.9. - Checking Phos - BMP in AM  #Type II diabetes mellitus: last A1c 7.3 06/19/14. Pt is on Lantus 20u qhs with Novolog 8u TID with CBGs running 100s-200s. Will start her on SSI and resume her home Lantus once she is tolerating a diet. - SSI-sensitive in setting of chronic renal disease.  - Carb Mod, renal diet w/ fluid restriction  #Chronic combined systolic and diastolic CHF (congestive heart failure): Stable. Does not appear volume overloaded. Last ECHO '13 with EF 55-60%, no wall motion abnormality, grade-2-diastolic dysfunction.  - Continue meds per Cardiology.   #Cough: Pt with cough and wheezing at home and overnight with cough. She was admitted in December with SOB and cough and was started on a PPI, which she is not currently taking. Flovent started in the clinic 06/19/14. Will continue here.  - Flovent  - Restart Protonix  daily - Albuterol inhaler PRN cough and wheezing - PFTs as outpatient  #GERD: Diagnosed with GERD in Dec and started on BID PPI with plans to decrease the dose 4 weeks later if sx  improved. Pt quit taking the PPI and denies any additional sx. However, she does still have a cough, which could be contributed by reflux. - Protonix  daily  #DVT PPx: On Heparin drip   Dispo: Disposition is deferred at this time, awaiting improvement of current medical problems. Anticipated discharge in approximately 1-3 day(s).   The patient does have a current PCP (Rich Number, MD) and does need an [facility] hospital follow-up appointment after discharge.  The patient does not have transportation limitations that hinder transportation to clinic appointments.  Signed: Genelle Gather, MD 06/20/2014, 9:09 AM

## 2014-06-20 NOTE — ED Provider Notes (Signed)
CSN: [id number]     Arrival date & time 06/20/14  0546 History   First MD Initiated Contact with Patient 06/20/14 0559     Chief Complaint  Patient presents with  . Chest Pain  . Shortness of Breath     (Consider location/radiation/quality/duration/timing/severity/associated sxs/prior Treatment) HPI Comments: Patient with h/o CABG approximately 10 years ago, cath 2012 -- presents with acute onset of chest pain awaking the patient from sleep at about 4 AM today. Patient woke her daughter stating that she was having chest pain in the middle of her chest. She also complained of a general weak feeling, nausea, shortness of breath, and a tight feeling in her abdomen. Patient did not vomit. Pain does not radiate (in contradiction to nursing note). No lightheadedness, lower extremity edema, diaphoresis, palpitations. No treatments prior to arrival. The onset of this condition was acute. The course is constant. Aggravating factors: none. Alleviating factors: none.    Patient is a 81 y.o. female presenting with chest pain and shortness of breath. The history is provided by the patient and medical records. The history is limited by a language barrier. A language interpreter was used (family member).  Chest Pain Associated symptoms: abdominal pain and shortness of breath   Associated symptoms: no back pain, no cough, no diaphoresis, no fever, no nausea, no palpitations and not vomiting   Shortness of Breath Associated symptoms: abdominal pain and chest pain   Associated symptoms: no cough, no diaphoresis, no fever, no neck pain, no rash and no vomiting     Past Medical History  Diagnosis Date  . Diabetes mellitus, type 2   . GERD (gastroesophageal reflux disease)   . Hepatitis B   . Hyperlipemia   . Hypertension   . Cervical dysplasia   . Atrophic vaginitis   . Spinal stenosis     s/p decompression  . CAD (coronary artery disease) April 2008    s/p CABG;    cath 8/12:  LM patent, pLAD  occluded, OM1 30-40%, pOM2 80% and 60% after the anastomosis, pRCA occluded, S-DX occluded (old), S-OM 2 occluded (new), L-LAD patent, dLAD provided collats to the PDA; Med Rx  rec.; consider PCI to OM2 if fails med Rx  . Hx of hysterectomy   . UTI (lower urinary tract infection)   . Diverticulosis of intestine 04/29/2014    Of ascending colon noted on CT abd/ pelvis 04/20/14   Past Surgical History  Procedure Laterality Date  . Back surgery      decompression L3-S1  . Coronary artery bypass graft  April 2008  . Total abdominal hysterectomy      cervical dysplasia  . Esophagogastroduodenoscopy Left 04/22/2014    Procedure: ESOPHAGOGASTRODUODENOSCOPY (EGD);  Surgeon: Shirley Friar, MD;  Location: [facility] ENDOSCOPY;  Service: Endoscopy;  Laterality: Left;   History reviewed. No pertinent family history. History  Substance Use Topics  . Smoking status: Never Smoker   . Smokeless tobacco: Never Used  . Alcohol Use: No   OB History    No data available     Review of Systems  Constitutional: Negative for fever and diaphoresis.  Eyes: Negative for redness.  Respiratory: Positive for shortness of breath. Negative for cough.   Cardiovascular: Positive for chest pain. Negative for palpitations and leg swelling.  Gastrointestinal: Positive for abdominal pain. Negative for nausea and vomiting.  Genitourinary: Negative for dysuria.  Musculoskeletal: Negative for back pain and neck pain.  Skin: Negative for rash.  Neurological: Negative for syncope and  light-headedness.  Psychiatric/Behavioral: The patient is not nervous/anxious.     Allergies  Cozaar and Lisinopril  Home Medications   Prior to Admission medications   Medication Sig Start Date End Date Taking? Authorizing Provider  acetaminophen (TYLENOL) 500 MG tablet Take 2 tablets (1,000 mg total) by mouth every 8 (eight) hours as needed for mild pain. 12/25/13 12/25/14 Yes Carly Rivet, MD  amLODipine (NORVASC) 5 MG tablet Take 1  tablet (5 mg total) by mouth daily. 06/11/13  Yes Lorretta HarpXilin Niu, MD  aspirin EC 81 MG tablet TAKE 1 TABLET BY MOUTH EVERY DAY 01/03/13  Yes Lorretta HarpXilin Niu, MD  carvedilol (COREG) 25 MG tablet TAKE 1 TABLET (25 MG TOTAL) BY MOUTH 2 (TWO) TIMES DAILY WITH A MEAL. 04/23/14  Yes Carly Rivet, MD  CRESTOR 20 MG tablet TAKE 1 TABLET (20 MG TOTAL) BY MOUTH DAILY. 11/22/13  Yes Levert FeinsteinJames M Granfortuna, MD  docusate sodium (COLACE) 100 MG capsule Take 100 mg by mouth 2 (two) times daily.   Yes Historical Provider, MD  fluticasone (FLOVENT HFA) 44 MCG/ACT inhaler Inhale 1 puff into the lungs 2 (two) times daily. 06/19/14  Yes Harold BarbanLawrence Ngo, MD  hydrALAZINE (APRESOLINE) 25 MG tablet Take 1 tablet (25 mg total) by mouth 3 (three) times daily. 06/11/13  Yes Lorretta HarpXilin Niu, MD  insulin aspart (NOVOLOG FLEXPEN) 100 UNIT/ML FlexPen Please take 8 units with breakfast, 8 units with lunch, and 10 units with dinner/supper 06/19/14  Yes Harold BarbanLawrence Ngo, MD  isosorbide mononitrate (IMDUR) 60 MG 24 hr tablet Take 1 tablet (60 mg total) by mouth daily. 04/30/14  Yes Alexa Senaida Oresichardson, MD  LANTUS 100 UNIT/ML injection INJECT 0.2 MLS (20 UNITS TOTAL) INTO THE SKIN AT BEDTIME. 09/17/13  Yes Lorretta HarpXilin Niu, MD  nitroGLYCERIN (NITROSTAT) 0.4 MG SL tablet Place 0.4 mg under the tongue every 5 (five) minutes as needed. For chest pain. Maximum of 3 doses   Yes Historical Provider, MD  PROAIR HFA 108 (90 BASE) MCG/ACT inhaler INHALE 1 PUFF EVERY 6 HOURS AS NEEDED FOR WHEEZE 04/05/14  Yes Carly Rivet, MD  trimethoprim-polymyxin b (POLYTRIM) ophthalmic solution Place 1 drop into both eyes every 6 (six) hours. 06/04/14  Yes Harold BarbanLawrence Ngo, MD  B-D ULTRAFINE III SHORT PEN 31G X 8 MM MISC USE AS DIRECTED TO INJECT INSULIN 3 TIMES A DAY 03/12/14   Carly Rivet, MD  calcitRIOL (ROCALTROL) 0.25 MCG capsule Take 2 capsules (0.5 mcg total) by mouth daily. Patient not taking: Reported on 06/20/2014 12/25/13   Rich Numberarly Rivet, MD  calcium citrate-vitamin D (CITRACAL+D) 315-200 MG-UNIT per  tablet Take 2 tablets by mouth 2 (two) times daily. Patient not taking: Reported on 06/20/2014 12/10/13   Rich Numberarly Rivet, MD  glucose blood (BAYER CONTOUR NEXT TEST) test strip 1 each by Other route as needed for other. Use 4 to 5 times daily to check blood sugar. diag code E11.9. Insulin dependent 06/04/14   Harold BarbanLawrence Ngo, MD  sennosides-docusate sodium (SENOKOT-S) 8.6-50 MG tablet Take 1 tablet by mouth daily. Patient not taking: Reported on 06/20/2014 04/11/12   Lorretta HarpXilin Niu, MD   BP 187/94 mmHg  Pulse 81  Temp(Src) 98.1 F (36.7 C) (Oral)  Resp 20  SpO2 100%   Physical Exam  Constitutional: She appears well-developed and well-nourished.  HENT:  Head: Normocephalic and atraumatic.  Mouth/Throat: Mucous membranes are normal. Mucous membranes are not dry.  Eyes: Conjunctivae are normal.  Neck: Trachea normal and normal range of motion. Neck supple. Normal carotid pulses and no JVD present. No  muscular tenderness present. Carotid bruit is not present. No tracheal deviation present.  Cardiovascular: Normal rate, regular rhythm, S1 normal, S2 normal, normal heart sounds and intact distal pulses.  Exam reveals no decreased pulses.   No murmur heard. Pulmonary/Chest: Effort normal. No respiratory distress. She has no wheezes. She exhibits no tenderness.  Abdominal: Soft. Normal aorta and bowel sounds are normal. There is no tenderness. There is no rebound and no guarding.  Musculoskeletal: Normal range of motion.  Neurological: She is alert.  Skin: Skin is warm and dry. She is not diaphoretic. No cyanosis. No pallor.  Psychiatric: She has a normal mood and affect.  Nursing note and vitals reviewed.   ED Course  Procedures (including critical care time) Labs Review Labs Reviewed  CBC WITH DIFFERENTIAL/PLATELET - Abnormal; Notable for the following:    RBC 3.32 (*)    Hemoglobin 10.0 (*)    HCT 30.7 (*)    Platelets 100 (*)    All other components within normal limits  BASIC METABOLIC PANEL -  Abnormal; Notable for the following:    Sodium 134 (*)    CO2 18 (*)    Glucose, Bld 226 (*)    BUN 40 (*)    Creatinine, Ser 2.91 (*)    GFR calc non Af Amer 14 (*)    GFR calc Af Amer 16 (*)    All other components within normal limits  CBG MONITORING, ED - Abnormal; Notable for the following:    Glucose-Capillary 205 (*)    All other components within normal limits  I-STAT TROPOININ, ED - Abnormal; Notable for the following:    Troponin i, poc 0.19 (*)    All other components within normal limits  TROPONIN I  HEPARIN LEVEL (UNFRACTIONATED)    Imaging Review Dg Chest 2 View  06/20/2014   CLINICAL DATA:  Chest pain and dyspnea  EXAM: CHEST  2 VIEW  COMPARISON:  04/23/2014  FINDINGS: There is prior sternotomy and CABG. Heart size is upper normal, unchanged. There is chronic appearing interstitial coarsening. No airspace opacities are evident. There are no pleural effusions.  IMPRESSION: No acute cardiopulmonary findings   Electronically Signed   By: Ellery Plunk M.D.   On: 06/20/2014 06:33     EKG Interpretation None       6:05 AM Patient seen and examined. Work-up initiated. Medications ordered. EKG reviewed. Discussed with Dr. Hyacinth Meeker who has seen.   Vital signs reviewed and are as follows: BP 187/94 mmHg  Pulse 81  Temp(Src) 98.1 F (36.7 C) (Oral)  Resp 20  SpO2 100%  6:25 AM iStat troponin of 0.19. Given history and patient presentation, suspect this represents true ACS. Will begin treatment for ACS with nitro drip and heparin.   From cardiology note 04/2014:  Her last echocardiogram was June 2013 showed an ejection fraction of 55-60% with normal wall motion. Grade 2 diastolic dysfunction. Mild MR. Last cardiac catheterization was August 2012: LAD 100% occluded proximally. OM1 was small with 30-40% diffuse disease. Second obtuse marginal branch was larger and had 80% long tubular area of disease proximally. 60% focal area of disease distal to the old anastomotic  site. Patent SVG to the distal RCA the Y graft area was 100% occluded and noted previously. The PDA filled from left-to-right collaterals. SVG to the diagonal branch was known to be occluded continued to be so. SVG to the second obtuse marginal branch was 100% occluded(this was new). LIMA to the LAD was widely patent.  The OM 2 branch was treated medically.   MDM   Final diagnoses:  Chest pain, unspecified chest pain type   Admit. Pt with CP, possible NSTEMI. Will need CP rule-out/eval.     Renne Crigler, PA-C 06/20/14 1610  Vida Roller, MD 06/24/14 [phone]

## 2014-06-20 NOTE — Progress Notes (Signed)
ANTICOAGULATION CONSULT NOTE - Initial Consult  Pharmacy Consult for heparin Indication: chest pain/ACS  Allergies  Allergen Reactions  . Cozaar Other (See Comments)    Sig increase in her creatinine that resolved once it was D/C'd  Per Cr visible in epic, it seems her baseline Cr = 1.7-1.9, and the slight improvement to Cr was an outlier when compared to recent Cr.  We will do a trial of Lisinopril (and monitor for 30% rise in Cr), as she would greatly benefit from renal protection.  . Lisinopril     Cough     Patient Measurements: Height: 5\' 1"  (154.9 cm) Weight: 107 lb 5.8 oz (48.7 kg) IBW/kg (Calculated) : 47.8  Vital Signs: Temp: 98.1 F (36.7 C) (02/18 0602) Temp Source: Oral (02/18 0602) BP: 187/94 mmHg (02/18 0602) Pulse Rate: 81 (02/18 0602)   Medical History: Past Medical History  Diagnosis Date  . Diabetes mellitus, type 2   . GERD (gastroesophageal reflux disease)   . Hepatitis B   . Hyperlipemia   . Hypertension   . Cervical dysplasia   . Atrophic vaginitis   . Spinal stenosis     s/p decompression  . CAD (coronary artery disease) April 2008    s/p CABG;    cath 8/12:  LM patent, pLAD occluded, OM1 30-40%, pOM2 80% and 60% after the anastomosis, pRCA occluded, S-DX occluded (old), S-OM 2 occluded (new), L-LAD patent, dLAD provided collats to the PDA; Med Rx  rec.; consider PCI to OM2 if fails med Rx  . Hx of hysterectomy   . UTI (lower urinary tract infection)   . Diverticulosis of intestine 04/29/2014    Of ascending colon noted on CT abd/ pelvis 04/20/14      Assessment: 81yo female c/o CP radiating to LUE associated w/ SOB and weakness, i-stat troponin elevated, to begin heparin.  Goal of Therapy:  Heparin level 0.3-0.7 units/ml Monitor platelets by anticoagulation protocol: Yes   Plan:  Will give heparin 2000 units IV bolus x1 followed by gtt at 700 units/hr (was recently therapeutic at this rate) and monitor heparin levels and  CBC.  Vernard GamblesVeronda Liyat Faulkenberry, PharmD, BCPS  06/20/2014,6:28 AM

## 2014-06-20 NOTE — Progress Notes (Addendum)
ANTICOAGULATION CONSULT NOTE - Follow Up Consult  Pharmacy Consult for Heparin Indication: chest pain/ACS  Allergies  Allergen Reactions  . Cozaar Other (See Comments)    Sig increase in her creatinine that resolved once it was D/C'd  Per Cr visible in epic, it seems her baseline Cr = 1.7-1.9, and the slight improvement to Cr was an outlier when compared to recent Cr.  We will do a trial of Lisinopril (and monitor for 30% rise in Cr), as she would greatly benefit from renal protection.  . Lisinopril     Cough     Patient Measurements: Height: 5\' 1"  (154.9 cm) Weight: 108 lb 7.5 oz (49.2 kg) IBW/kg (Calculated) : 47.8 Heparin Dosing Weight: 49.2 kg  Vital Signs: Temp: 98.8 F (37.1 C) (02/18 1452) Temp Source: Oral (02/18 1452) BP: 149/80 mmHg (02/18 1452) Pulse Rate: 81 (02/18 0602)  Labs:  Recent Labs  06/20/14 0600 06/20/14 1315 06/20/14 1515  HGB 10.0*  --   --   HCT 30.7*  --   --   PLT 100*  --   --   HEPARINUNFRC  --   --  0.67  CREATININE 2.91*  --   --   TROPONINI <0.03 <0.03  --     Estimated Creatinine Clearance: 11.4 mL/min (by C-G formula based on Cr of 2.91).  Assessment:   Initial heparin level is therapeutic (0.67) on 700 units/hr.   Platelet count only 100K. (range 111-155 per prior results in Epic)  Goal of Therapy:  Heparin level 0.3-0.7 units/ml Monitor platelets by anticoagulation protocol: Yes   Plan:   Continue heparin drip at 700 units/hr.  Daily heparin level and CBC while on heparin.  Watch platelet count.  Monitor for any bleeding.  Bianca [name], Bianca [name], [place] Pager: [phone] 06/20/2014,4:39 PM

## 2014-06-21 ENCOUNTER — Other Ambulatory Visit: Payer: Self-pay

## 2014-06-21 DIAGNOSIS — Z951 Presence of aortocoronary bypass graft: Secondary | ICD-10-CM

## 2014-06-21 DIAGNOSIS — R079 Chest pain, unspecified: Secondary | ICD-10-CM | POA: Diagnosis not present

## 2014-06-21 DIAGNOSIS — I2510 Atherosclerotic heart disease of native coronary artery without angina pectoris: Secondary | ICD-10-CM | POA: Diagnosis not present

## 2014-06-21 DIAGNOSIS — R7989 Other specified abnormal findings of blood chemistry: Secondary | ICD-10-CM | POA: Diagnosis present

## 2014-06-21 DIAGNOSIS — R778 Other specified abnormalities of plasma proteins: Secondary | ICD-10-CM | POA: Diagnosis present

## 2014-06-21 DIAGNOSIS — R072 Precordial pain: Secondary | ICD-10-CM

## 2014-06-21 DIAGNOSIS — N184 Chronic kidney disease, stage 4 (severe): Secondary | ICD-10-CM | POA: Diagnosis not present

## 2014-06-21 LAB — GLUCOSE, CAPILLARY
Glucose-Capillary: 89 mg/dL (ref 70–99)
Glucose-Capillary: 180 mg/dL — ABNORMAL HIGH (ref 70–99)

## 2014-06-21 MED ORDER — AMLODIPINE BESYLATE 10 MG PO TABS: 10.0000 mg | ORAL_TABLET | Freq: Every day | ORAL | Status: DC

## 2014-06-21 MED ORDER — ISOSORBIDE MONONITRATE ER 120 MG PO TB24: 120.0000 mg | ORAL_TABLET | Freq: Every day | ORAL | Status: DC

## 2014-06-21 MED ORDER — PANTOPRAZOLE SODIUM 40 MG PO TBEC: 40.0000 mg | DELAYED_RELEASE_TABLET | Freq: Every day | ORAL | Status: DC

## 2014-06-21 NOTE — Progress Notes (Signed)
Subjective:  No chest pain or SOB  . amLODipine  10 mg Oral Daily  . aspirin EC  81 mg Oral Daily  . carvedilol  25 mg Oral BID WC  . docusate sodium  100 mg Oral BID  . fluticasone  1 puff Inhalation BID  . heparin subcutaneous  5,000 Units Subcutaneous 3 times per day  . hydrALAZINE  25 mg Oral TID  . insulin aspart  0-5 Units Subcutaneous QHS  . insulin aspart  0-9 Units Subcutaneous TID WC  . isosorbide mononitrate  120 mg Oral Daily  . pantoprazole  40 mg Oral Daily  . rosuvastatin  20 mg Oral q1800     Objective:  Vital Signs in the last 24 hours: Temp:  [98 F (36.7 C)-98.8 F (37.1 C)] 98 F (36.7 C) (02/19 0529) Pulse Rate:  [71-77] 71 (02/19 0529) Resp:  [14-24] 14 (02/19 0529) BP: (134-159)/(56-87) 145/69 mmHg (02/19 0529) SpO2:  [99 %-100 %] 100 % (02/19 0529) Weight:  [106 lb 11.2 oz (48.399 kg)-108 lb 7.5 oz (49.2 kg)] 106 lb 11.2 oz (48.399 kg) (02/19 0529)  Intake/Output from previous day:  Intake/Output Summary (Last 24 hours) at 06/21/14 0924 Last data filed at 06/21/14 0600  Gross per 24 hour  Intake 733.35 ml  Output    800 ml  Net -66.65 ml    Physical Exam: General appearance: alert, cooperative and no distress Lungs: clear to auscultation bilaterally Heart: regular rate and rhythm   Rate: 72  Rhythm: normal sinus rhythm and premature ventricular contractions (PVC)  Lab Results:  Recent Labs  06/20/14 0600  WBC 5.0  HGB 10.0*  PLT 100*    Recent Labs  06/20/14 0600  NA 134*  K 4.3  CL 108  CO2 18*  GLUCOSE 226*  BUN 40*  CREATININE 2.91*    Recent Labs  06/20/14 1315 06/20/14 1832  TROPONINI <0.03 <0.03   No results for input(s): INR in the last 72 hours.  Imaging: Imaging results have been reviewed  Cardiac Studies:  Assessment/Plan:  81 y.o. [place] female with a PMH significant for extensive CAD status post CABG 2008. She had cath Aug 2012- patent LIMA to LAD, occluded SVG to diagonal, occluded SVG to  OM 2. She has had predominantly diastolic heart failure (left ventricular ejection fraction 40-45% by echo in December) with an extensive inferior wall scar by nuclear scintigraphy, but without reversible ischemia. She has advanced chronic kidney disease with a baseline creatinine around 2.9 and has IDDM.   Principal Problem:   Chest pain Active Problems:   Elevated troponin-POC elelvated, probably secondary to HTN   Essential hypertension   Hx of CABG '08, cath Aug 2012- med Rx   Chronic renal failure, stage 4 (severe)   Type II diabetes mellitus   Chronic combined systolic and diastolic CHF (congestive heart failure)   Hyperlipidemia   GERD   PLAN: Elevated Troponin POC on admission in setting of elevated B/P  but subsequent Troponin I negative x 2. Family indicates she has been compliant with medications. Dr Royann Shivers did not feel she was volume overloaded. Amlodipine and Imdur adjusted. She could possibly be discharged later today- MD to see. She has an appointment with Dr Antoine Poche on 07/03/14.  Corine Shelter PA-C Beeper [phone] 06/21/2014, 9:24 AM  The patient was seen, examined and discussed with Corine Shelter, PA-C and I agree with the above.   The patient is euvolemic and asymptomatic, denies CP or SOB off NTG  drip, no further ischemic work up necessary at this point. Her BP is still elevated, however her Imdur and amlodipine were just adjusted. If needed we could further increase Hydralazine to 50 mg po TID.  We will discharge today, she has follow up appointment with Dr Antoine PocheHochrein in 10 days.   Lars MassonELSON, Lyan Moyano H 06/21/2014

## 2014-06-21 NOTE — Progress Notes (Signed)
Subjective: Patient denies any chest pain, palpitations, or dyspnea this morning. She states she has a cough.   Objective: Vital signs in last 24 hours: Filed Vitals:   06/20/14 2100 06/21/14 0129 06/21/14 0529 06/21/14 0915  BP: 144/69 136/63 145/69 158/83  Pulse: 77 76 71 74  Temp: 98.1 F (36.7 C) 98.2 F (36.8 C) 98 F (36.7 C) 98 F (36.7 C)  TempSrc: Oral Oral Oral Oral  Resp: 20 16 14    Height:      Weight:   106 lb 11.2 oz (48.399 kg)   SpO2: 100% 99% 100% 100%   Weight change: 1 lb 1.6 oz (0.5 kg)  Intake/Output Summary (Last 24 hours) at 06/21/14 1018 Last data filed at 06/21/14 0600  Gross per 24 hour  Intake 733.35 ml  Output    800 ml  Net -66.65 ml   Physical Exam General: alert, sitting up in bed, pleasant, NAD HEENT: [place]/AT, EOMI, mucus membranes moist CV: RRR, no m/g/r Pulm: CTA bilaterally, breaths non-labored Abd: BS+, soft, non-tender Ext: warm, no edema Neuro: alert and oriented x 3, no focal deficits  Lab Results: Basic Metabolic Panel:  Recent Labs Lab 06/20/14 0600 06/20/14 0955  NA 134*  --   K 4.3  --   CL 108  --   CO2 18*  --   GLUCOSE 226*  --   BUN 40*  --   CREATININE 2.91*  --   CALCIUM 8.9  --   PHOS  --  3.6   CBC:  Recent Labs Lab 06/20/14 0600  WBC 5.0  NEUTROABS 3.6  HGB 10.0*  HCT 30.7*  MCV 92.5  PLT 100*   Cardiac Enzymes:  Recent Labs Lab 06/20/14 0600 06/20/14 1315 06/20/14 1832  TROPONINI <0.03 <0.03 <0.03   CBG:  Recent Labs Lab 06/19/14 1359 06/20/14 0557 06/20/14 1636 06/20/14 2051 06/21/14 0622  GLUCAP 154* 205* 133* 103* 89   Hemoglobin A1C:  Recent Labs Lab 06/19/14 1407  HGBA1C 7.3   Studies/Results: Dg Chest 2 View  06/20/2014   CLINICAL DATA:  Chest pain and dyspnea  EXAM: CHEST  2 VIEW  COMPARISON:  04/23/2014  FINDINGS: There is prior sternotomy and CABG. Heart size is upper normal, unchanged. There is chronic appearing interstitial coarsening. No airspace opacities  are evident. There are no pleural effusions.  IMPRESSION: No acute cardiopulmonary findings   Electronically Signed   By: Ellery Plunkaniel R Mitchell M.D.   On: 06/20/2014 06:33   Medications: I have reviewed the patient's current medications. Scheduled Meds: . amLODipine  10 mg Oral Daily  . aspirin EC  81 mg Oral Daily  . carvedilol  25 mg Oral BID WC  . docusate sodium  100 mg Oral BID  . fluticasone  1 puff Inhalation BID  . heparin subcutaneous  5,000 Units Subcutaneous 3 times per day  . hydrALAZINE  25 mg Oral TID  . insulin aspart  0-5 Units Subcutaneous QHS  . insulin aspart  0-9 Units Subcutaneous TID WC  . isosorbide mononitrate  120 mg Oral Daily  . pantoprazole  40 mg Oral Daily  . rosuvastatin  20 mg Oral q1800   Continuous Infusions:  PRN Meds:.acetaminophen, albuterol Assessment/Plan:  Chest pain: Patient's chest pain has improved. Initially had elevated istat troponin but repeat serum troponins are negative x 3. Cardiology recommended increasing her Amlodipine and Imdur doses, no intervention needed at this time. She is not a good candidate for revascularization options. Likely not CHF exacerbation  as she is not fluid overloaded. Her chest pain was most likely related to her elevated BP. GERD is another possibilty since she was not taking her Protonix at home.  - Follow up with Cardiology on 07/03/14 - Follow up in IM clinic on 06/25/14 - Continue home carvedilol  BID - Continue Amlodipine  daily - Continue Imdur 120 mg daily  - Continue lipid and CBG control  Hyperlipidemia: Last lipid panel 2/9 with elevated total cholesterol 218, LDL 106, HDL 65, and TG 235. She is on a statin at home, which was continued this admission.  - Continue Crestor  daily  Essential hypertension: BP elevated at 200s systolic upon admission. She was placed on a nitro drip which is now discontinued. Her BP is stable in 130-160s.  - Continue Coreg  BID - Continue Hydralazine   TID - Continue Norvasc  daily - Continue Imdur  qday - BP recheck at PCP appt next week   Chronic renal failure, stage 4 (severe): Stable. Likely 2/2 DM and HTN. Renal u/s in the past showed only simple cysts and chronic medical disease. Renal function at baseline w/ Cr 2.91. Pt not on HD and still makes urine. Phos normal.  - Continue to monitor   Type 2 DM: Last A1c 7.3 on 06/19/14. Pt is on Lantus 20 units qhs with Novolog 8 units TID with CBGs running 100s-200s. Blood sugars well controlled here in 100s-200s.  - SSI-sensitive in setting of chronic renal disease.  - Discharge home on lantus and novolog  Chronic combined systolic and diastolic CHF (congestive heart failure): Stable. Does not appear volume overloaded. Last ECHO Dec 2015 with EF 40-45%, evidence of LVH.  - Continue meds per Cardiology  Cough: CXR negative for any infiltrates. Likely from her GERD. She was placed on Flovent in the clinic recently.  - Continue Flovent  - Continue Protonix  daily - Continue Albuterol inhaler PRN cough and wheezing - PFTs as outpatient  GERD: Diagnosed with GERD in Dec and started on BID PPI with plans to decrease the dose 4 weeks later if sx improved. Pt quit taking the PPI and denies any additional sx. However, she does still have a cough, which could be contributed by reflux. - Protonix  daily, will continue upon discharge    Diet: Heart healthy VTE PPx: Heparin Q Dispo: Discharge today   The patient does have a current PCP (Rich Number, MD) and does need an [facility] hospital follow-up appointment after discharge.  The patient does not have transportation limitations that hinder transportation to clinic appointments.  .Services Needed at time of discharge: Y = Yes, Blank = No PT:   OT:   RN:   Equipment:   Other:       Rich Number, MD 06/21/2014, 10:18 AM

## 2014-06-21 NOTE — Progress Notes (Signed)
Patient has been active with [facility] for community nurse and plan to continue to follow as needed.  Of note, [facility] services does not replace or interfere with services arranged by inpatient care management team.  Made inpatient RNCM aware of [facility] involvement.  Please contact Carrolyn LeighVictoria Brewr, RN, BSN, [facility] Hospital Liaison at [phone] for questions.

## 2014-06-21 NOTE — Discharge Summary (Signed)
Name: Bianca [name] MRN: [id number] DOB: [birth year] 81 y.o. PCP: Rich Number, MD  Date of Admission: 06/20/2014  5:49 AM Date of Discharge: 06/21/2014 Attending Physician: Burns Spain, MD  Discharge Diagnosis: Principal Problem Chest Pain Active Problems Hyperlipidemia Essential HTN Chronic renal failure stage 4 Type 2 DM Chronic combined CHF Cough GERD   Discharge Medications:   Medication List    TAKE these medications        acetaminophen 500 MG tablet  Commonly known as:  TYLENOL  Take 2 tablets (1,000 mg total) by mouth every 8 (eight) hours as needed for mild pain.     amLODipine 10 MG tablet  Commonly known as:  NORVASC  Take 1 tablet (10 mg total) by mouth daily.     aspirin EC 81 MG tablet  TAKE 1 TABLET BY MOUTH EVERY DAY     B-D ULTRAFINE III SHORT PEN 31G X 8 MM Misc  Generic drug:  Insulin Pen Needle  USE AS DIRECTED TO INJECT INSULIN 3 TIMES A DAY     calcitRIOL 0.25 MCG capsule  Commonly known as:  ROCALTROL  Take 2 capsules (0.5 mcg total) by mouth daily.     calcium citrate-vitamin D 315-200 MG-UNIT per tablet  Commonly known as:  CITRACAL+D  Take 2 tablets by mouth 2 (two) times daily.     carvedilol 25 MG tablet  Commonly known as:  COREG  TAKE 1 TABLET (25 MG TOTAL) BY MOUTH 2 (TWO) TIMES DAILY WITH A MEAL.     CRESTOR 20 MG tablet  Generic drug:  rosuvastatin  TAKE 1 TABLET (20 MG TOTAL) BY MOUTH DAILY.     docusate sodium 100 MG capsule  Commonly known as:  COLACE  Take 100 mg by mouth 2 (two) times daily.     fluticasone 44 MCG/ACT inhaler  Commonly known as:  FLOVENT HFA  Inhale 1 puff into the lungs 2 (two) times daily.     glucose blood test strip  Commonly known as:  BAYER CONTOUR NEXT TEST  1 each by Other route as needed for other. Use 4 to 5 times daily to check blood sugar. diag code E11.9. Insulin dependent     hydrALAZINE 25 MG tablet  Commonly known as:  APRESOLINE  Take 1 tablet (25 mg total) by mouth  3 (three) times daily.     insulin aspart 100 UNIT/ML FlexPen  Commonly known as:  NOVOLOG FLEXPEN  Please take 8 units with breakfast, 8 units with lunch, and 10 units with dinner/supper     isosorbide mononitrate 120 MG 24 hr tablet  Commonly known as:  IMDUR  Take 1 tablet (120 mg total) by mouth daily.     LANTUS 100 UNIT/ML injection  Generic drug:  insulin glargine  INJECT 0.2 MLS (20 UNITS TOTAL) INTO THE SKIN AT BEDTIME.     nitroGLYCERIN 0.4 MG SL tablet  Commonly known as:  NITROSTAT  Place 0.4 mg under the tongue every 5 (five) minutes as needed. For chest pain. Maximum of 3 doses     pantoprazole 40 MG tablet  Commonly known as:  PROTONIX  Take 1 tablet (40 mg total) by mouth daily.     PROAIR HFA 108 (90 BASE) MCG/ACT inhaler  Generic drug:  albuterol  INHALE 1 PUFF EVERY 6 HOURS AS NEEDED FOR WHEEZE     sennosides-docusate sodium 8.6-50 MG tablet  Commonly known as:  SENOKOT-S  Take 1 tablet by mouth daily.  trimethoprim-polymyxin b ophthalmic solution  Commonly known as:  POLYTRIM  Place 1 drop into both eyes every 6 (six) hours.        Disposition and follow-up:   Bianca [name] was discharged from [facility] in Good condition.  At the hospital follow up visit please address:  1.  Chest Pain: Is pt still having pain? Is her BP controlled? HTN: BP recheck  2.  Labs / imaging needed at time of follow-up: None   3.  Pending labs/ test needing follow-up: None  Follow-up Appointments:     Follow-up Information    Follow up with Rollene Rotunda, MD On 07/03/2014.   Specialty:  Cardiology   Why:  2:15   Contact information:   [address] [place] [address] [phone]       Follow up with Rich Number, MD.   Specialty:  Internal Medicine   Why:  Appointment on Feb 23rd at 1:15 PM   Contact information:   [address] [place] [address] [phone]       Discharge Instructions: Discharge  Instructions    Diet - low sodium heart healthy    Complete by:  As directed      Increase activity slowly    Complete by:  As directed            Consultations:  Cardiology   Procedures Performed:  Dg Chest 2 View  06/20/2014   CLINICAL DATA:  Chest pain and dyspnea  EXAM: CHEST  2 VIEW  COMPARISON:  04/23/2014  FINDINGS: There is prior sternotomy and CABG. Heart size is upper normal, unchanged. There is chronic appearing interstitial coarsening. No airspace opacities are evident. There are no pleural effusions.  IMPRESSION: No acute cardiopulmonary findings   Electronically Signed   By: Ellery Plunk M.D.   On: 06/20/2014 06:33    Admission HPI: 81yo F w/ PMH cCHF, CKD 4, DM2, HTN, and CAD s/p CABG '08 presents with acute onset chest pain. The patient does not speak [place], but her daughter, who does speak English, was present during the exam and was able to translate. The patient was up all night coughing per the daughter, and got up around 4am to use the bathroom when she began to experience sharp, non-radiating, mid-sternal chest pain with palpitations and shortness of breath. The pain lasted 10-20 minutes and is not reproducible with palpation.   Her blood pressure was also elevated on arrival to the ED, with SBP in 200s per EDP. Nitro drip was started and SBP improved to 160s.   EKG without changes from previous EKG, and shows inferior Q waves with nonspecific T-wave changes. poc troponin 0.19 but serum troponin <0.03. Cardiology was consulted and recommends better BP control, continue heparin drip, and trend troponin. Holding on intervention at this time.    Hospital Course by problem list:   Chest Pain: Patient presented with acute onset, mid-sternal, sharp, non-radiating chest pain in the setting of a cough. There was concern for ACS given her history of CAD s/p CABG in 2008, uncontrolled HTN, and Type 2 DM. Initially she had an elevated istat troponin, but additional serum  troponins were negative x 3. EKG did not show evidence of ischemia. She was not fluid overloaded on exam so CHF exacerbation was not likely. Cardiology was consulted and recommended better control of her BP by increasing her home Norvasc to 10 mg daily and Imdur to 120 mg daily. She was continued  on her home Coreg 25 mg BID and Hydralazine 25 mg TID. Her chest pain was determined to be nonischemic and patient is to have follow up with Cardiology and IM clinic.   Hyperlipidemia: Last lipid panel 2/9 with elevated total cholesterol 218, LDL 106, HDL 65, and TG 235. She was continued on her home Crestor 20 mg daily.   Essential HTN: On admission, her BP was elevated in the 200s systolic. She was placed on a nitro drip and her BPs improved to the 130-160s systolic. The nitro drip was discontinued. Cardiology recommended increasing her home Norvasc and Imdur. She was discharged on Coreg 25 mg BID, Hydralazine 25 mg TID, Norvasc 10 mg daily, and Imdur 120 mg daily. She will need a BP recheck at her IM clinic follow up visit in 1-2 weeks.   Chronic renal failure stage 4: Stable. Likely secondary to DM and HTN. Her renal function on admission was at baseline Cr 2.9.   Type 2 DM: Last A1c 7.3 on 06/19/14. Pt is on Lantus 20 units qhs with Novolog 8 units TID at home. She was placed on a sensitive ISS during her hospitalization. She was discharged on her home meds.   Chronic combined CHF: Patient did not appear fluid overloaded on exam. Her last echo was in Dec 2015 which showed EF 40-45% and evidence of LVH. She was continued on her cardiac medications as listed above.   Cough: Patient complained of cough on admission. CXR negative for any infiltrates. Most likely related to her GERD. She was continued on home Flovent and restarted on Protonix 40 mg daily. She has never had PFTs and would benefit from this test as outpatient.   GERD: Diagnosed with GERD in Dec and started on BID PPI with plans to decrease the  dose 4 weeks later if sx improved. Pt quit taking the PPI and denies any additional sx. However, she does still have a cough, which could be contributed by reflux. Patient discharged on Protonix 40 mg daily.   Discharge Vitals:   BP 155/66 mmHg  Pulse 70  Temp(Src) 98 F (36.7 C) (Oral)  Resp 14  Ht  (1.549 m)  Wt 106 lb 11.2 oz (48.399 kg)  BMI 20.17 kg/m2  SpO2 100% Physical Exam General: alert, sitting up in bed, pleasant, NAD HEENT: [place]/AT, EOMI, mucus membranes moist CV: RRR, no m/g/r Pulm: CTA bilaterally, breaths non-labored Abd: BS+, soft, non-tender Ext: warm, no edema Neuro: alert and oriented x 3, no focal deficits  Discharge Labs:  Results for orders placed or performed during the hospital encounter of 06/20/14 (from the past 24 hour(s))  Troponin I-serum (0, 3, 6 hours)     Status: None   Collection Time: 06/20/14  1:15 PM  Result Value Ref Range   Troponin I <0.03 <0.031 ng/mL  Heparin level (unfractionated)     Status: None   Collection Time: 06/20/14  3:15 PM  Result Value Ref Range   Heparin Unfractionated 0.67 0.30 - 0.70 IU/mL  Glucose, capillary     Status: Abnormal   Collection Time: 06/20/14  4:36 PM  Result Value Ref Range   Glucose-Capillary 133 (H) 70 - 99 mg/dL  Troponin I-serum (0, 3, 6 hours)     Status: None   Collection Time: 06/20/14  6:32 PM  Result Value Ref Range   Troponin I <0.03 <0.031 ng/mL  Glucose, capillary     Status: Abnormal   Collection Time: 06/20/14  8:51 PM  Result Value  Ref Range   Glucose-Capillary 103 (H) 70 - 99 mg/dL   Comment 1 Notify RN    Comment 2 Documented in Char   Glucose, capillary     Status: None   Collection Time: 06/21/14  6:22 AM  Result Value Ref Range   Glucose-Capillary 89 70 - 99 mg/dL   Comment 1 Notify RN    Comment 2 Documented in Char     Signed: Rich Numberarly Isbella Arline, MD 06/21/2014, 11:28 AM    Services Ordered on Discharge: None Equipment Ordered on Discharge: None

## 2014-06-21 NOTE — Progress Notes (Signed)
UR completed 

## 2014-06-21 NOTE — Progress Notes (Addendum)
  Date: 06/21/2014  Patient name: Bianca [name]  Medical record number: [id number]  Date of birth: Sep 09, 1930   I have seen and evaluated Bianca [name] and discussed their care with the Residency Team. Bianca [name] was admitted for CP and has known CAD. She was found to have SBP in 200's on admit.   Sitting SOB. NAD. No resp accessory muscle usage. HRRR no MRG. Ext no edema. +2 DP pulses.   Assessment and Plan: I have seen and evaluated the patient as outlined above. I agree with the formulated Assessment and Plan as detailed in the residents' admission note, with the following changes:   1. Acute CP in setting of known CAD - she is on max med tx with BB, statin, and ASA. LDL 106 1 yr ago but on high potency statin. She R/O for AMI and is not a candidate for cath due to renal dysfxn. Stress test Dec showed no reversible ischemia. Max med mgmt with better BP control.  2. Accelerated HTN - her norvasc has been maxed and her imdur increased. We encouraged her to [facility] her [facility] as home BP monitoring would be very useful. [facility].   [facility].   Burns SpainElizabeth A Butcher, MD 2/19/201612:57 PM

## 2014-06-21 NOTE — Discharge Instructions (Signed)
It was a pleasure taking care of you, Bianca [name].   - Start taking Amlodipine 10 mg daily. - Start taking Imdur 120 mg daily.   You can finish your old pill bottles of Amlodipine and Imdur, but take 2 pills so that you have the new correct dose for each one. When you pick up your new prescriptions only take 1 pill for each medication.  - Start taking Protonix 40 mg daily. - Continue taking your other medications the same as you have been doing. - I will see you in the clinic next week, Feb 23rd at 1:15 PM.   Take care, Dr. Beckie Saltsivet

## 2014-06-21 NOTE — Progress Notes (Signed)
DC IV, DC Tele, DC Home. Discharge instructions and home medications discussed with patient and patient's family members. Patient and family denied any questions or concerns at this time. Patient family members signed refusal of interpretation form before being discharged. Patient leaving unit via wheelchair and appears in no acute distress.

## 2014-06-25 ENCOUNTER — Encounter: Payer: Self-pay | Admitting: Internal Medicine

## 2014-06-25 ENCOUNTER — Encounter (HOSPITAL_COMMUNITY): Payer: Medicare Other

## 2014-06-25 ENCOUNTER — Ambulatory Visit (INDEPENDENT_AMBULATORY_CARE_PROVIDER_SITE_OTHER): Payer: Medicare Other | Admitting: Internal Medicine

## 2014-06-25 VITALS — BP 141/64 | HR 75 | Temp 97.9°F | Ht 61.0 in | Wt 110.2 lb

## 2014-06-25 DIAGNOSIS — R05 Cough: Secondary | ICD-10-CM

## 2014-06-25 DIAGNOSIS — I10 Essential (primary) hypertension: Secondary | ICD-10-CM

## 2014-06-25 DIAGNOSIS — I255 Ischemic cardiomyopathy: Secondary | ICD-10-CM

## 2014-06-25 DIAGNOSIS — R079 Chest pain, unspecified: Secondary | ICD-10-CM

## 2014-06-25 DIAGNOSIS — R059 Cough, unspecified: Secondary | ICD-10-CM

## 2014-06-25 DIAGNOSIS — M79652 Pain in left thigh: Secondary | ICD-10-CM

## 2014-06-25 DIAGNOSIS — K219 Gastro-esophageal reflux disease without esophagitis: Secondary | ICD-10-CM

## 2014-06-25 MED ORDER — OMEPRAZOLE 20 MG PO CPDR: 20.0000 mg | DELAYED_RELEASE_CAPSULE | Freq: Every day | ORAL | Status: DC

## 2014-06-25 MED ORDER — DICLOFENAC SODIUM 1 % TD GEL: 2.0000 g | Freq: Four times a day (QID) | TRANSDERMAL | Status: DC

## 2014-06-25 NOTE — Patient Instructions (Addendum)
It was a pleasure taking care of you, Bianca [name].  1. Blood pressure - Continue taking Amlodipine 10 mg daily, Imdur 120 mg daily, Coreg 25 mg twice a day, Hydralazine 25 mg three times a day  2. Cough - Pick up prescription for Protonix 40 mg daily. This medicine should help your cough.   3. Left thigh pain - Apply voltaren gel twice a day to your left thigh - Also use Tylenol 500 mg twice a day. Can go up to Tylenol 1,000 mg twice a day.  4. Neck Pain - Likely arthritis - Continue taking Tylenol as above. If pain gets worse please make an appointment in clinic.   5. Diabetes - Your last HbA1c was 7.3- great job! - Continue taking Lantus 20 units at bedtime - Continue taking Novolog 8 units in AM, 8 units at lunch, and 10 units with dinner   I will see you in 3 months.

## 2014-06-30 DIAGNOSIS — M79652 Pain in left thigh: Secondary | ICD-10-CM | POA: Insufficient documentation

## 2014-06-30 NOTE — Progress Notes (Signed)
   Subjective:    Patient ID: Bianca [name], female    DOB: [birth year], 81 y.o.   MRN: [id number]  HPI [name] is a 81yo woman with PMHx of HTN, Type 2 DM, ischemic cardiomyopathy, combined CHF, CAD s/p CABG in 2008, and hyperlipidemia who presents today for a hospital follow up visit.   Please see A&P documentation for further details.    Review of Systems General: Denies fever, chills, night sweats, changes in weight, changes in appetite HEENT: Denies headaches, ear pain, changes in vision, rhinorrhea, sore throat CV: Denies CP, palpitations, SOB, orthopnea Pulm: Denies SOB, wheezing GI: Denies abdominal pain, nausea, vomiting, diarrhea, constipation, melena, hematochezia GU: Denies dysuria, hematuria, frequency Msk: Denies joint pains Neuro: Denies weakness, numbness, tingling Skin: Denies rashes, bruising    Objective:   Physical Exam General: alert, sitting up in chair, pleasant, NAD HEENT: [place]/AT, EOMI, mucus membranes moist CV: RRR, no m/g/r Pulm: CTA bilaterally, breaths non-labored Abd: BS+, soft, non-tender Ext: warm, no edema. No tenderness to palpation of lower extremities. Patient has good ROM in all extremities.  Neuro: alert and oriented x 3, no focal deficits. Strength 5/5 in all extremities.     Assessment & Plan:

## 2014-06-30 NOTE — Assessment & Plan Note (Signed)
Patient reports left thigh pain that started a few days ago. She describes the pain as "muscle pain". Her pain resolves after taking Tylenol 500 mg. She denies any weakness in her legs, bone pain, associated rashes or joint pains, fevers, chills. She denies any trauma to the leg.    Assessment and Plan: Her left thigh pain seems musculoskeletal in nature as the pain resolves with Tylenol. Will continue to monitor. - Recommended to continue Tylenol for pain, can go up to 2 g total if needed  - Prescribed Voltaren gel to be applied to area twice daily

## 2014-06-30 NOTE — Assessment & Plan Note (Signed)
Lab Results  Component Value Date   HGBA1C 7.3 06/19/2014   HGBA1C 8.1* 04/21/2014   HGBA1C 7.7 12/10/2013     Assessment: Diabetes control: good control (HgbA1C at goal) Progress toward A1C goal:  at goal Comments:   Plan: Medications:  continue current medications Home glucose monitoring: Frequency: 2 times a day Timing:   Instruction/counseling given: reminded to bring blood glucose meter & log to each visit and reminded to bring medications to each visit Educational resources provided:   Self management tools provided: copy of home glucose meter download Other plans:   Most recent HbA1c much improved. Will continue Lantus 20 units QHS and Novolog 8 units breakfast, 8 units lunch, and 10 units dinner. Patient was congratulated on her improvement in her diabetes control. Patient states she does not want to have fundoscopic exam to assess for retinopathy. I explained that we like to have this test done every year to assess for damage to her eyes from diabetes. Patient still refuses to have test performed.

## 2014-06-30 NOTE — Assessment & Plan Note (Signed)
BP Readings from Last 3 Encounters:  06/25/14 141/64  06/21/14 155/66  06/19/14 113/63    Lab Results  Component Value Date   NA 134* 06/20/2014   K 4.3 06/20/2014   CREATININE 2.91* 06/20/2014    Assessment: Blood pressure control: controlled Progress toward BP goal:    Comments:   Plan: Medications:  continue current medications Educational resources provided:   Self management tools provided:   Other plans:   BP controlled since hospitalization. Continue Coreg 25 mg BID, Hydralazine 25 mg TID, Imdur 120 mg daily, and Amlodipine 10 mg daily.

## 2014-06-30 NOTE — Assessment & Plan Note (Signed)
Concern that dry cough could be related to her GERD. Patient has not been taking Protonix because was too expensive.  Plan: - Discontinue Protonix - Switch to Prilosec, more affordable - Monitor if symptoms improve

## 2014-06-30 NOTE — Assessment & Plan Note (Addendum)
Patient continues to report a dry cough. She had noted this during her last outpatient visit and during her hospitalization. She denies any fever, chills, rhinorrhea, sore throat, and sputum production. She was prescribed Protonix 40 mg daily upon discharge from the hospital, but her daughter states they did not pick up this prescription because it was too expensive. PFTs were ordered at her last visit, but these have not been done yet.   Assessment: Cough most likely secondary to GERD vs. COPD/asthma. She is not on an ACE inhibitor.   Plan: - Change Protonix to Prilosec (more affordable) - Follow up that PFTs have been scheduled  - Continue inhalers for now

## 2014-06-30 NOTE — Assessment & Plan Note (Signed)
Patient presents for a hospital follow up today after being hospitalized from 2/18-2/19 for chest pain. Initially she had a mildly elevated troponin, but remaining troponins were negative x 3. She also had hypertensive urgency on admission with systolic BP in the 200s. Her chest pain was attributed to her elevated blood pressure. Cardiology recommended better BP control by increasing her Amlodipine to 10 mg daily and Imdur to 120 mg daily.   Today, patient denies any further chest pain. She is accompanied by her daughter and [facility] who report she has been compliant with the new changes in her BP medications as above as well as her Coreg, Hydralazine, and Crestor. She denies any dyspnea, DOE, palpitations, or orthopnea. She has an appointment with Dr. Antoine PocheHochrein on 07/03/14.

## 2014-07-01 NOTE — Progress Notes (Signed)
INTERNAL MEDICINE TEACHING ATTENDING ADDENDUM - Shad Ledvina, MD: I reviewed and discussed at the time of visit with the resident Dr. Rivet, the patient's medical history, physical examination, diagnosis and results of pertinent tests and treatment and I agree with the patient's care as documented.  

## 2014-07-03 ENCOUNTER — Ambulatory Visit (INDEPENDENT_AMBULATORY_CARE_PROVIDER_SITE_OTHER): Payer: Medicare Other | Admitting: Cardiology

## 2014-07-03 ENCOUNTER — Encounter: Payer: Self-pay | Admitting: Cardiology

## 2014-07-03 VITALS — BP 134/72 | HR 74 | Ht <= 58 in | Wt 113.3 lb

## 2014-07-03 DIAGNOSIS — I255 Ischemic cardiomyopathy: Secondary | ICD-10-CM

## 2014-07-03 DIAGNOSIS — R0600 Dyspnea, unspecified: Secondary | ICD-10-CM

## 2014-07-03 NOTE — Patient Instructions (Signed)
Your physician recommends that you schedule a follow-up appointment in: one year with Dr. Hochrein  

## 2014-07-03 NOTE — Progress Notes (Signed)
HPI  The patient returns for follow up of CAD.   He has a history of CABG.  Her cardiac anatomy from 2012 is as described below. He was hospitalized several months ago with some chest discomfort. Her EF which have been normal was down to 40-45% by echo. She was found on Myoview tablet moderate-sized infarct in the mid inferior wall but no reversible ischemia. Medical management was suggested. She was back in the hospital predominantly with dyspnea that was thought to be related to not well controlled blood pressure. Meds were adjusted. She returns for follow-up. Through an interpreter she says that she has had continued occasional dyspnea. This is somewhat sporadic. She says she's better than when she was in the hospital. She's not having any chest pressure, neck or arm discomfort. She's not having any palpitations, presyncope or syncope. He does not see much she has PND or orthopnea. She seems to be watching her salt. She has no new swelling.  Allergies  Allergen Reactions  . Cozaar Other (See Comments)    Sig increase in her creatinine that resolved once it was D/C'd  Per Cr visible in epic, it seems her baseline Cr = 1.7-1.9, and the slight improvement to Cr was an outlier when compared to recent Cr.  We will do a trial of Lisinopril (and monitor for 30% rise in Cr), as she would greatly benefit from renal protection.  . Lisinopril     Cough     Current Outpatient Prescriptions  Medication Sig Dispense Refill  . acetaminophen (TYLENOL) 500 MG tablet Take 2 tablets (1,000 mg total) by mouth every 8 (eight) hours as needed for mild pain. 100 tablet 2  . amLODipine (NORVASC) 10 MG tablet Take 1 tablet (10 mg total) by mouth daily. 30 tablet 0  . aspirin EC 81 MG tablet TAKE 1 TABLET BY MOUTH EVERY DAY 150 tablet 1  . B-D ULTRAFINE III SHORT PEN 31G X 8 MM MISC USE AS DIRECTED TO INJECT INSULIN 3 TIMES A DAY 100 each 6  . calcitRIOL (ROCALTROL) 0.25 MCG capsule Take 2 capsules (0.5 mcg total)  by mouth daily. 60 capsule 5  . calcium citrate-vitamin D (CITRACAL+D) 315-200 MG-UNIT per tablet Take 2 tablets by mouth 2 (two) times daily. 100 tablet 3  . carvedilol (COREG) 25 MG tablet TAKE 1 TABLET (25 MG TOTAL) BY MOUTH 2 (TWO) TIMES DAILY WITH A MEAL. 120 tablet 3  . CRESTOR 20 MG tablet TAKE 1 TABLET (20 MG TOTAL) BY MOUTH DAILY. 90 tablet 3  . diclofenac sodium (VOLTAREN) 1 % GEL Apply 2 g topically 4 (four) times daily. 100 g 0  . docusate sodium (COLACE) 100 MG capsule Take 100 mg by mouth 2 (two) times daily.    . fluticasone (FLOVENT HFA) 44 MCG/ACT inhaler Inhale 1 puff into the lungs 2 (two) times daily. 1 Inhaler 12  . glucose blood (BAYER CONTOUR NEXT TEST) test strip 1 each by Other route as needed for other. Use 4 to 5 times daily to check blood sugar. diag code E11.9. Insulin dependent 125 each 11  . hydrALAZINE (APRESOLINE) 25 MG tablet Take 1 tablet (25 mg total) by mouth 3 (three) times daily. 270 tablet 5  . insulin aspart (NOVOLOG FLEXPEN) 100 UNIT/ML FlexPen Please take 8 units with breakfast, 8 units with lunch, and 10 units with dinner/supper 100 mL 9  . isosorbide mononitrate (IMDUR) 120 MG 24 hr tablet Take 1 tablet (120 mg total) by mouth daily.  30 tablet 0  . LANTUS 100 UNIT/ML injection INJECT 0.2 MLS (20 UNITS TOTAL) INTO THE SKIN AT BEDTIME. 10 mL 5  . nitroGLYCERIN (NITROSTAT) 0.4 MG SL tablet Place 0.4 mg under the tongue every 5 (five) minutes as needed. For chest pain. Maximum of 3 doses    . omeprazole (PRILOSEC) 20 MG capsule Take 1 capsule (20 mg total) by mouth daily. 30 capsule 0  . PROAIR HFA 108 (90 BASE) MCG/ACT inhaler INHALE 1 PUFF EVERY 6 HOURS AS NEEDED FOR WHEEZE 8.5 each 2  . sennosides-docusate sodium (SENOKOT-S) 8.6-50 MG tablet Take 1 tablet by mouth daily. 90 tablet 3   No current facility-administered medications for this visit.    Past Medical History  Diagnosis Date  . GERD (gastroesophageal reflux disease)   . Hepatitis B   .  Hyperlipemia   . Hypertension   . Cervical dysplasia   . Atrophic vaginitis   . Spinal stenosis     s/p decompression  . CAD (coronary artery disease) April 2008    s/p CABG;    cath 8/12:  LM patent, pLAD occluded, OM1 30-40%, pOM2 80% and 60% after the anastomosis, pRCA occluded, S-DX occluded (old), S-OM 2 occluded (new), L-LAD patent, dLAD provided collats to the PDA; Med Rx  rec.; consider PCI to OM2 if fails med Rx  . Hx of hysterectomy   . UTI (lower urinary tract infection)   . Diverticulosis of intestine 04/29/2014    Of ascending colon noted on CT abd/ pelvis 04/20/14  . Diabetes mellitus, type 2     insulin dependent    Past Surgical History  Procedure Laterality Date  . Back surgery      decompression L3-S1  . Coronary artery bypass graft  April 2008  . Total abdominal hysterectomy      cervical dysplasia  . Esophagogastroduodenoscopy Left 04/22/2014    Procedure: ESOPHAGOGASTRODUODENOSCOPY (EGD);  Surgeon: Shirley Friar, MD;  Location: [facility] ENDOSCOPY;  Service: Endoscopy;  Laterality: Left;    ROS:  As stated in the HPI and negative for all other systems.  PHYSICAL EXAM BP 134/72 mmHg  Pulse 74  Ht  (1.448 m)  Wt 113 lb 4.8 oz (51.393 kg)  BMI 24.51 kg/m2 GENERAL:  Well appearing HEENT:  Pupils equal round and reactive, fundi not visualized, oral mucosa unremarkable NECK:  No jugular venous distention, waveform within normal limits, carotid upstroke brisk and symmetric, no bruits, no thyromegaly LYMPHATICS:  No cervical, inguinal adenopathy LUNGS:  Clear to auscultation bilaterally BACK:  No CVA tenderness CHEST:  Well healed sternotomy scar. HEART:  PMI not displaced or sustained,S1 and S2 within normal limits, no S3, no S4, no clicks, no rubs, no murmurs ABD:  Flat, positive bowel sounds normal in frequency in pitch, no bruits, no rebound, no guarding, no midline pulsatile mass, no hepatomegaly, no splenomegaly EXT:  2 plus pulses throughout, no  edema, no cyanosis no clubbing   ASSESSMENT AND PLAN  CAD:  The patient did have an intermediate risk nuclear study but she's not having any pain consistent with angina and she has significant renal insufficiency. Given this medical management should be continued. At this point she'll continue on the meds as listed.  CHRONIC SYSTOLIC AND DIASTOLIC HF:  She seems to be euvolemic. I would not want a increase her diuretics despite some complaints of dyspnea.  She has significant renal insufficiency. We discussed salt and fluid restriction.  HTN:  I think control of this is  the key. She will remain on the meds as listed. Her blood pressure was controlled today.  Hospital records reviewed.

## 2014-07-09 ENCOUNTER — Ambulatory Visit (HOSPITAL_COMMUNITY)
Admission: RE | Admit: 2014-07-09 | Discharge: 2014-07-09 | Disposition: A | Discharge status: Home / Self Care | Payer: Medicare Other | Source: Ambulatory Visit | Attending: Oncology | Admitting: Oncology

## 2014-07-09 DIAGNOSIS — R05 Cough: Secondary | ICD-10-CM | POA: Diagnosis present

## 2014-07-09 DIAGNOSIS — R059 Cough, unspecified: Secondary | ICD-10-CM

## 2014-07-09 LAB — PULMONARY FUNCTION TEST
DL/VA % pred: 63 %
DL/VA: 2.59 ml/min/mmHg/L
DLCO UNC % PRED: 29 %
DLCO UNC: 5.09 ml/min/mmHg
FEF 25-75 POST: 0.42 L/s
FEF 25-75 Pre: 0.41 L/sec
FEF2575-%Change-Post: 2 %
FEF2575-%Pred-Post: 39 %
FEF2575-%Pred-Pre: 38 %
FEV1-%Change-Post: 0 %
FEV1-%PRED-POST: 66 %
FEV1-%Pred-Pre: 66 %
FEV1-POST: 0.96 L
FEV1-Pre: 0.96 L
FEV1FVC-%CHANGE-POST: 8 %
FEV1FVC-%PRED-PRE: 84 %
FEV6-%Change-Post: -4 %
FEV6-%PRED-PRE: 79 %
FEV6-%Pred-Post: 76 %
FEV6-Post: 1.4 L
FEV6-Pre: 1.47 L
FEV6FVC-%Change-Post: 4 %
FEV6FVC-%PRED-POST: 105 %
FEV6FVC-%Pred-Pre: 101 %
FVC-%Change-Post: -8 %
FVC-%Pred-Post: 71 %
FVC-%Pred-Pre: 78 %
FVC-PRE: 1.53 L
FVC-Post: 1.41 L
PRE FEV6/FVC RATIO: 96 %
Post FEV1/FVC ratio: 68 %
Post FEV6/FVC ratio: 100 %
Pre FEV1/FVC ratio: 63 %
RV % pred: 98 %
RV: 2.13 L
TLC % PRED: 81 %
TLC: 3.52 L

## 2014-07-09 MED ORDER — ALBUTEROL SULFATE (2.5 MG/3ML) 0.083% IN NEBU
2.5000 mg | INHALATION_SOLUTION | Freq: Once | RESPIRATORY_TRACT | Status: AC
  Administered 2014-07-09: 2.5 mg via RESPIRATORY_TRACT

## 2014-07-11 ENCOUNTER — Other Ambulatory Visit: Payer: Self-pay | Admitting: Internal Medicine

## 2014-07-11 DIAGNOSIS — J438 Other emphysema: Secondary | ICD-10-CM

## 2014-07-11 NOTE — Assessment & Plan Note (Addendum)
PFTs have been performed yielding a FEV1/FVC ratio of 63% and a FEV1 of 66%. No improvement with bronchodilators. Consistent with GOLD II COPD.

## 2014-07-23 ENCOUNTER — Ambulatory Visit: Payer: Self-pay | Admitting: *Deleted

## 2014-07-23 ENCOUNTER — Telehealth: Payer: Self-pay | Admitting: *Deleted

## 2014-07-23 NOTE — Patient Outreach (Signed)
Transition of Care call initiated through [facility] [employer], interpreter identification number [phone].  Member or daughter did not answer call, no voice mail attached to phone, unable to leave a message.  Care manager will attempt to call back later this week.   Kemper DurieMonica Giara Mcgaughey, BSN, [facility] Care Management  [facility] Care Manager [phone]

## 2014-07-29 ENCOUNTER — Other Ambulatory Visit: Payer: Self-pay | Admitting: *Deleted

## 2014-07-29 ENCOUNTER — Other Ambulatory Visit: Payer: Self-pay | Admitting: Internal Medicine

## 2014-07-29 NOTE — Patient Outreach (Signed)
Call placed by this care manager to member through [facility] [employer], interpreter identification number [phone].  No answer at primary number and no voice mail set up to leave message.  This makes 3rd attempt at patient outreach without answer (1st on 3/18, 2nd on 3/22, 3rd today on 3/28). Will attempt to call secondary number listed.    Bianca [name], BSN, [facility] Care Management  [facility] Care Manager [phone]

## 2014-07-29 NOTE — Patient Outreach (Signed)
Secondary number listed in chart called for patient outreach, no answer.  HIPPA compliant voice message left.  Will await return call.  If no return call, will send outreach letter.  Kemper DurieMonica Montzerrat Brunell, BSN, [facility] Care Management  [facility] Care Manager [phone]

## 2014-07-30 ENCOUNTER — Encounter: Payer: Medicare Other | Admitting: Internal Medicine

## 2014-08-05 ENCOUNTER — Other Ambulatory Visit: Payer: Self-pay | Admitting: *Deleted

## 2014-08-05 ENCOUNTER — Other Ambulatory Visit: Payer: Self-pay | Admitting: Internal Medicine

## 2014-08-05 NOTE — Patient Outreach (Signed)
Bianca [name]   08/05/2014  Bianca [name] 1931/03/13 [id number]  Bianca [name] is an 81 y.o. female  Subjective:  Member reports that she has been feeling good this week.  She continues to report some pain, but has not taken any Tylenol for relief today.  Encouraged to take Tylenol for pain relief.  Objective:   Review of Systems  Musculoskeletal: Positive for back pain and neck pain.       Chronic pain, encouraged to take Tylenol    Physical Exam  Constitutional: She is oriented to person, place, and time. She appears well-developed and well-nourished.  HENT:  Head: Normocephalic.  Neck: Normal range of motion.  Cardiovascular: Normal rate, regular rhythm and normal heart sounds.   Respiratory: Effort normal and breath sounds normal.  GI: Soft. Bowel sounds are normal.  Musculoskeletal: Normal range of motion.  Neurological: She is alert and oriented to person, place, and time.  Skin: Skin is warm and dry.    Current Medications:   Current Outpatient Prescriptions  Medication Sig Dispense Refill  . acetaminophen (TYLENOL) 500 MG tablet Take 2 tablets (1,000 mg total) by mouth every 8 (eight) hours as needed for mild pain. 100 tablet 2  . amLODipine (NORVASC) 10 MG tablet Take 1 tablet (10 mg total) by mouth daily. 30 tablet 0  . aspirin EC 81 MG tablet TAKE 1 TABLET BY MOUTH EVERY DAY 150 tablet 1  . B-D ULTRAFINE III SHORT PEN 31G X 8 MM MISC USE AS DIRECTED TO INJECT INSULIN 3 TIMES A DAY 100 each 6  . calcitRIOL (ROCALTROL) 0.25 MCG capsule Take 2 capsules (0.5 mcg total) by mouth daily. 60 capsule 5  . carvedilol (COREG) 25 MG tablet TAKE 1 TABLET (25 MG TOTAL) BY MOUTH 2 (TWO) TIMES DAILY WITH A MEAL. 120 tablet 3  . CRESTOR 20 MG tablet TAKE 1 TABLET (20 MG TOTAL) BY MOUTH DAILY. 90 tablet 3  . diclofenac sodium (VOLTAREN) 1 % GEL Apply 2 g topically 4 (four) times daily. 100 g 0  . docusate sodium (COLACE) 100 MG capsule Take 100 mg by  mouth 2 (two) times daily.    . fluticasone (FLOVENT HFA) 44 MCG/ACT inhaler Inhale 1 puff into the lungs 2 (two) times daily. 1 Inhaler 12  . glucose blood (BAYER CONTOUR NEXT TEST) test strip 1 each by Other route as needed for other. Use 4 to 5 times daily to check blood sugar. diag code E11.9. Insulin dependent 125 each 11  . hydrALAZINE (APRESOLINE) 25 MG tablet Take 1 tablet (25 mg total) by mouth 3 (three) times daily. 270 tablet 5  . insulin aspart (NOVOLOG FLEXPEN) 100 UNIT/ML FlexPen Please take 8 units with breakfast, 8 units with lunch, and 10 units with dinner/supper 100 mL 9  . isosorbide mononitrate (IMDUR) 120 MG 24 hr tablet TAKE 1 TABLET (120 MG TOTAL) BY MOUTH DAILY. 30 tablet 0  . LANTUS 100 UNIT/ML injection INJECT 0.2 MLS (20 UNITS TOTAL) INTO THE SKIN AT BEDTIME. 10 mL 5  . nitroGLYCERIN (NITROSTAT) 0.4 MG SL tablet Place 0.4 mg under the tongue every 5 (five) minutes as needed. For chest pain. Maximum of 3 doses    . omeprazole (PRILOSEC) 20 MG capsule TAKE 1 CAPSULE (20 MG TOTAL) BY MOUTH DAILY. 30 capsule 0  . PROAIR HFA 108 (90 BASE) MCG/ACT inhaler INHALE 1 PUFF EVERY 6 HOURS AS NEEDED FOR WHEEZE 8.5 each 2  . calcium citrate-vitamin D (CITRACAL+D)  315-200 MG-UNIT per tablet Take 2 tablets by mouth 2 (two) times daily. (Patient not taking: Reported on 08/05/2014) 100 tablet 3  . sennosides-docusate sodium (SENOKOT-S) 8.6-50 MG tablet Take 1 tablet by mouth daily. (Patient not taking: Reported on 08/05/2014) 90 tablet 3   No current facility-administered medications for this visit.    Functional Status:   In your present state of health, do you have any difficulty performing the following activities: 06/20/2014 06/19/2014  Is the patient deaf or have difficulty hearing? Y Y  Hearing N N  Vision N N  Difficulty concentrating or making decisions N N  Walking or climbing stairs? N N  Doing errands, shopping? N N    Fall/Depression Screening:    PHQ 2/9 Scores 06/25/2014  06/19/2014 06/04/2014 05/09/2014 04/02/2014 02/21/2014 02/14/2014  PHQ - 2 Score 0 0 0 0 0 0 0    Assessment:   Met with member and daughter, Bianca [name], at [facility] home for regular scheduled routine home visit.  Language resources interpreter Bianca [name] present during visit.  Daughter made aware of attempts to make contact failed.  She states that the phone number has not changed, however she has been having trouble with her phone for the past couple weeks.  Alternate phone number (member's son) received in the event that the member's daughter can't be reached at the primary number.    Daughter reports that she has been taking member's blood pressure daily and recording them.  Readings reviewed, member's blood pressure ranges from 371 to 062 systolic.  There are only 3 blood pressure readings since last visit on 10/10/46 with a systolic under 546.  Daughter and member denies knowing the target blood pressure for this member and they report taking medications as prescribed.  Dr. Shela Commons office called to inform them of member's blood pressure.  Spoke with Bianca [name] and obtained appointment for next week.  Bianca [name] was also informed that the member needed an authorization for Amlodipine refill.  Refill called into the pharmacy.  Member's blood sugars since last visit range from 69 to 411.  Member states that the 411 was after a really big meal and before she had taken her mealtime insulin.  Discussed the importance of checking blood sugar before eating and administering insulin according to the guidelines.  Pharmacy called for refill on test strips.  Member and daughter states that they had a visit from [employer] concerning personal care assistance.  They state that they declined services with this agency due to the language barrier.  The daughter states that she would rather care for the member herself.  Both member and daughter deny any further questions or concerns.  Encouraged to contact this care manager  with any concerns.  Plan:   Routine home visit scheduled for next month.  [place]        Patient Outreach from 08/05/2014 in [place] Problem One  Elevated blood pressure   Care Plan for Problem One  Active   Interventions for Problem One Long Term Goal  Member encouraged to continue to monitor blood pressure and take medications as prescribed.   [facility] Long Term Goal (31-90 days)  Member will have blood pressure below/at target level within the next 45 days   [facility] Long Term Goal Start Date  08/05/14   [facility] CM Short Term Goal #1 (0-30 days)  Member/family will ask primary physician what the target blood pressure is for this member within  the next 30 days   [facility] CM Short Term Goal #1 Start Date  08/05/14   [facility] CM Short Term Goal #2 (0-30 days)  Member will pick up refill for blood pressure medicine (Amlodipine) within the next week   [facility] CM Short Term Goal #2 Start Date  08/05/14   [facility] CM Short Term Goal #3 (0-30 days)  Member will see physician concerning elevated blood pressure next week   [facility] CM Short Term Goal #3 Start Date  08/05/14      Valente David, BSN, [place] Care Manager [phone]

## 2014-08-12 MED ORDER — AMLODIPINE BESYLATE 10 MG PO TABS: 10.0000 mg | ORAL_TABLET | Freq: Every day | ORAL | Status: DC

## 2014-08-13 ENCOUNTER — Ambulatory Visit: Payer: Medicare Other | Admitting: Internal Medicine

## 2014-08-14 ENCOUNTER — Encounter: Payer: Self-pay | Admitting: Internal Medicine

## 2014-08-14 ENCOUNTER — Ambulatory Visit (INDEPENDENT_AMBULATORY_CARE_PROVIDER_SITE_OTHER): Payer: Medicare Other | Admitting: Internal Medicine

## 2014-08-14 VITALS — BP 119/60 | HR 72 | Temp 97.0°F | Wt 106.7 lb

## 2014-08-14 DIAGNOSIS — E11329 Type 2 diabetes mellitus with mild nonproliferative diabetic retinopathy without macular edema: Secondary | ICD-10-CM | POA: Diagnosis present

## 2014-08-14 DIAGNOSIS — I129 Hypertensive chronic kidney disease with stage 1 through stage 4 chronic kidney disease, or unspecified chronic kidney disease: Secondary | ICD-10-CM

## 2014-08-14 DIAGNOSIS — Z794 Long term (current) use of insulin: Secondary | ICD-10-CM

## 2014-08-14 DIAGNOSIS — E1122 Type 2 diabetes mellitus with diabetic chronic kidney disease: Secondary | ICD-10-CM | POA: Diagnosis present

## 2014-08-14 DIAGNOSIS — N184 Chronic kidney disease, stage 4 (severe): Secondary | ICD-10-CM | POA: Diagnosis not present

## 2014-08-14 DIAGNOSIS — I10 Essential (primary) hypertension: Secondary | ICD-10-CM

## 2014-08-14 DIAGNOSIS — E119 Type 2 diabetes mellitus without complications: Secondary | ICD-10-CM

## 2014-08-14 LAB — HM DIABETES EYE EXAM

## 2014-08-14 MED ORDER — ISOSORBIDE MONONITRATE ER 120 MG PO TB24: 120.0000 mg | ORAL_TABLET | Freq: Every day | ORAL | Status: DC

## 2014-08-14 MED ORDER — AMLODIPINE BESYLATE 10 MG PO TABS: 10.0000 mg | ORAL_TABLET | Freq: Every day | ORAL | Status: DC

## 2014-08-14 MED ORDER — BAYER MICROLET LANCETS MISC: Status: DC

## 2014-08-14 MED ORDER — GLUCOSE BLOOD VI STRP: 1.0000 | ORAL_STRIP | Status: AC | PRN

## 2014-08-14 NOTE — Progress Notes (Signed)
Case discussed with Dr. Hoffman at the time of the visit.  We reviewed the resident's history and exam and pertinent patient test results.  I agree with the assessment, diagnosis, and plan of care documented in the resident's note. 

## 2014-08-14 NOTE — Patient Instructions (Addendum)
  General Instructions:   Thank you for bringing your medicines today. This helps us keep you safe from mistakes.   Progress Toward Treatment Goals:  Treatment Goal 08/14/2014  Hemoglobin A1C at goal  Blood pressure at goal  Prevent falls -    Self Care Goals & Plans:  Self Care Goal 08/14/2014  Manage my medications take my medicines as prescribed  Monitor my health keep track of my blood glucose; bring my glucose meter and log to each visit; keep track of my blood pressure  Eat healthy foods -  Be physically active -  Meeting treatment goals -    Home Blood Glucose Monitoring 08/14/2014  Check my blood sugar 3 times a day  When to check my blood sugar -     Care Management & Community Referrals:  Referral 08/13/2013  Referrals made for care management support none needed  Referrals made to community resources none

## 2014-08-15 NOTE — Progress Notes (Signed)
[facility] Subjective:   Patient ID: Bianca [name] female   DOB: 1930/05/12 81 y.o.   MRN: [id number]  HPI: [name] is a 81 y.o. female with a PMH detailed below who presents for follow up of elevated blood pressure readings.  She is Khmer speaking and we have an Public librarian, her daughter is also present.  HTN They having been checking her BP at instructed and have noted a number of elevated readings as well as some normal readings.  [facility] has been our to the house and scheduled her a follow up visit due to this.  She has been taking her BP medications as instructed and her daughter has made sure of this.  DM She has been doing very well with this per daughter report.  She does have a history of hypoglycemia and tests about 4-5 times a day.  They report they need a refill of testing supplies.  Past Medical History  Diagnosis Date  . GERD (gastroesophageal reflux disease)   . Hepatitis B   . Hyperlipemia   . Hypertension   . Cervical dysplasia   . Atrophic vaginitis   . Spinal stenosis     s/p decompression  . CAD (coronary artery disease) April 2008    s/p CABG;    cath 8/12:  LM patent, pLAD occluded, OM1 30-40%, pOM2 80% and 60% after the anastomosis, pRCA occluded, S-DX occluded (old), S-OM 2 occluded (new), L-LAD patent, dLAD provided collats to the PDA; Med Rx  rec.; consider PCI to OM2 if fails med Rx  . Hx of hysterectomy   . UTI (lower urinary tract infection)   . Diverticulosis of intestine 04/29/2014    Of ascending colon noted on CT abd/ pelvis 04/20/14  . Diabetes mellitus, type 2     insulin dependent   Current Outpatient Prescriptions  Medication Sig Dispense Refill  . acetaminophen (TYLENOL) 500 MG tablet Take 2 tablets (1,000 mg total) by mouth every 8 (eight) hours as needed for mild pain. 100 tablet 2  . amLODipine (NORVASC) 10 MG tablet Take 1 tablet (10 mg total) by mouth daily. 90 tablet 1  . aspirin EC 81 MG tablet TAKE 1 TABLET  BY MOUTH EVERY DAY 150 tablet 1  . B-D ULTRAFINE III SHORT PEN 31G X 8 MM MISC USE AS DIRECTED TO INJECT INSULIN 3 TIMES A DAY 100 each 6  . BAYER MICROLET LANCETS lancets Use as instructed 200 each 5  . calcitRIOL (ROCALTROL) 0.25 MCG capsule Take 2 capsules (0.5 mcg total) by mouth daily. 60 capsule 5  . calcium citrate-vitamin D (CITRACAL+D) 315-200 MG-UNIT per tablet Take 2 tablets by mouth 2 (two) times daily. (Patient not taking: Reported on 08/05/2014) 100 tablet 3  . carvedilol (COREG) 25 MG tablet TAKE 1 TABLET (25 MG TOTAL) BY MOUTH 2 (TWO) TIMES DAILY WITH A MEAL. 120 tablet 3  . CRESTOR 20 MG tablet TAKE 1 TABLET (20 MG TOTAL) BY MOUTH DAILY. 90 tablet 3  . diclofenac sodium (VOLTAREN) 1 % GEL Apply 2 g topically 4 (four) times daily. 100 g 0  . docusate sodium (COLACE) 100 MG capsule Take 100 mg by mouth 2 (two) times daily.    . fluticasone (FLOVENT HFA) 44 MCG/ACT inhaler Inhale 1 puff into the lungs 2 (two) times daily. 1 Inhaler 12  . glucose blood (BAYER CONTOUR NEXT TEST) test strip 1 each by Other route as needed for other. Use 4 to 5 times daily to  check blood sugar. diag code E11.9. Insulin dependent 200 each 5  . hydrALAZINE (APRESOLINE) 25 MG tablet Take 1 tablet (25 mg total) by mouth 3 (three) times daily. 270 tablet 5  . insulin aspart (NOVOLOG FLEXPEN) 100 UNIT/ML FlexPen Please take 8 units with breakfast, 8 units with lunch, and 10 units with dinner/supper 100 mL 9  . isosorbide mononitrate (IMDUR) 120 MG 24 hr tablet Take 1 tablet (120 mg total) by mouth daily. 90 tablet 1  . LANTUS 100 UNIT/ML injection INJECT 0.2 MLS (20 UNITS TOTAL) INTO THE SKIN AT BEDTIME. 10 mL 5  . nitroGLYCERIN (NITROSTAT) 0.4 MG SL tablet Place 0.4 mg under the tongue every 5 (five) minutes as needed. For chest pain. Maximum of 3 doses    . omeprazole (PRILOSEC) 20 MG capsule TAKE 1 CAPSULE (20 MG TOTAL) BY MOUTH DAILY. 30 capsule 0  . PROAIR HFA 108 (90 BASE) MCG/ACT inhaler INHALE 1 PUFF EVERY  6 HOURS AS NEEDED FOR WHEEZE 8.5 each 2  . sennosides-docusate sodium (SENOKOT-S) 8.6-50 MG tablet Take 1 tablet by mouth daily. (Patient not taking: Reported on 08/05/2014) 90 tablet 3   No current facility-administered medications for this visit.   No family history on file. History   Social History  . Marital Status: Widowed    Spouse Name: N/A  . Number of Children: N/A  . Years of Education: N/A   Social History Main Topics  . Smoking status: Never Smoker   . Smokeless tobacco: Never Used  . Alcohol Use: No  . Drug Use: No  . Sexual Activity: Not on file   Other Topics Concern  . None   Social History Narrative   Chinese immigrant   Housewife   Widowed   Lives w/ her son   Review of Systems: Review of Systems  Constitutional: Negative for fever and chills.  Eyes: Negative for blurred vision.  Respiratory: Negative for cough.   Cardiovascular: Negative for chest pain.  Gastrointestinal: Negative for heartburn.  Genitourinary: Negative for dysuria.  Neurological: Negative for dizziness and headaches.     Objective:  Physical Exam: Filed Vitals:   08/14/14 1511  BP: 119/60  Pulse: 72  Temp: 97 F (36.1 C)  TempSrc: Oral  Weight: 106 lb 11.2 oz (48.399 kg)  SpO2: 100%  Physical Exam  Constitutional: No distress.  Elderly female  HENT:  Head: Normocephalic and atraumatic.  Cardiovascular: Normal rate and regular rhythm.   Pulmonary/Chest: Effort normal and breath sounds normal. She has no rales.  Abdominal: Soft. Bowel sounds are normal.  Musculoskeletal: She exhibits no edema.  Nursing note and vitals reviewed.   Assessment & Plan:  Case discussed with Dr. Dalphine Handing  Essential hypertension BP Readings from Last 3 Encounters:  08/14/14 119/60  08/05/14 142/80  07/03/14 134/72    Lab Results  Component Value Date   NA 134* 06/20/2014   K 4.3 06/20/2014   CREATININE 2.91* 06/20/2014    Assessment: Blood pressure control:  controlled Progress toward BP goal:  at goal Comments: BP perfectly controlled in office  Plan: Medications:  Continue Amlodipine  daily, Coreg  BID, Imdur  dialy And hydralazine  daily Educational resources provided:   Self management tools provided:   Other plans: Daughter reports that she has had some difficulty getting medications on time, it looks like she may have gotten just one month refills called in, as they seem very interested in keeping BP well controlled I will give them 3 month Rx and  refills to help prevent running out. If additional BP meds are needed in future would prefer increasing hydralazine.    Type II diabetes mellitus with stage 4 chronic kidney disease - A1c of 7.3 is well controlled for age and hx of hypoglycemia.. Will continue her current medications and have her continue testing 4 times a day.  I have refilled her testing supplies.     Medications Ordered Meds ordered this encounter  Medications  . isosorbide mononitrate (IMDUR) 120 MG 24 hr tablet    Sig: Take 1 tablet (120 mg total) by mouth daily.    Dispense:  90 tablet    Refill:  1  . amLODipine (NORVASC) 10 MG tablet    Sig: Take 1 tablet (10 mg total) by mouth daily.    Dispense:  90 tablet    Refill:  1  . glucose blood (BAYER CONTOUR NEXT TEST) test strip    Sig: 1 each by Other route as needed for other. Use 4 to 5 times daily to check blood sugar. diag code E11.9. Insulin dependent    Dispense:  200 each    Refill:  5    The patient is insulin requiring, ICD 10 code E11.9. The patient tests 4 times per day.  Marland Kitchen. BAYER MICROLET LANCETS lancets    Sig: Use as instructed    Dispense:  200 each    Refill:  5    The patient is insulin requiring, ICD 10 code E11.9. The patient tests 4 times per day.   Other Orders Orders Placed This Encounter  Procedures  . Retinal/fundus photography

## 2014-08-15 NOTE — Assessment & Plan Note (Addendum)
-   A1c of 7.3 is well controlled for age and hx of hypoglycemia.. Will continue her current medications and have her continue testing 4 times a day.  I have refilled her testing supplies.

## 2014-08-15 NOTE — Assessment & Plan Note (Signed)
BP Readings from Last 3 Encounters:  08/14/14 119/60  08/05/14 142/80  07/03/14 134/72    Lab Results  Component Value Date   NA 134* 06/20/2014   K 4.3 06/20/2014   CREATININE 2.91* 06/20/2014    Assessment: Blood pressure control: controlled Progress toward BP goal:  at goal Comments: BP perfectly controlled in office  Plan: Medications:  Continue Amlodipine 10mg  daily, Coreg 25mg  BID, Imdur 120mg  dialy And hydralazine 25mg  daily Educational resources provided:   Self management tools provided:   Other plans: Daughter reports that she has had some difficulty getting medications on time, it looks like she may have gotten just one month refills called in, as they seem very interested in keeping BP well controlled I will give them 3 month Rx and refills to help prevent running out. If additional BP meds are needed in future would prefer increasing hydralazine.

## 2014-08-23 ENCOUNTER — Emergency Department (HOSPITAL_COMMUNITY): Payer: Medicare Other

## 2014-08-23 ENCOUNTER — Encounter (HOSPITAL_COMMUNITY): Payer: Self-pay | Admitting: Emergency Medicine

## 2014-08-23 ENCOUNTER — Inpatient Hospital Stay (HOSPITAL_COMMUNITY)
Admission: EM | Admit: 2014-08-23 | Discharge: 2014-08-27 | DRG: 303 | Disposition: A | Discharge status: Home / Self Care | Payer: Medicare Other | Attending: Internal Medicine | Admitting: Internal Medicine

## 2014-08-23 ENCOUNTER — Encounter: Payer: Self-pay | Admitting: *Deleted

## 2014-08-23 DIAGNOSIS — Z951 Presence of aortocoronary bypass graft: Secondary | ICD-10-CM

## 2014-08-23 DIAGNOSIS — R339 Retention of urine, unspecified: Secondary | ICD-10-CM | POA: Insufficient documentation

## 2014-08-23 DIAGNOSIS — B9620 Unspecified Escherichia coli [E. coli] as the cause of diseases classified elsewhere: Secondary | ICD-10-CM | POA: Diagnosis present

## 2014-08-23 DIAGNOSIS — I5042 Chronic combined systolic (congestive) and diastolic (congestive) heart failure: Secondary | ICD-10-CM | POA: Diagnosis present

## 2014-08-23 DIAGNOSIS — Z79899 Other long term (current) drug therapy: Secondary | ICD-10-CM

## 2014-08-23 DIAGNOSIS — N390 Urinary tract infection, site not specified: Secondary | ICD-10-CM | POA: Diagnosis present

## 2014-08-23 DIAGNOSIS — B1910 Unspecified viral hepatitis B without hepatic coma: Secondary | ICD-10-CM | POA: Diagnosis present

## 2014-08-23 DIAGNOSIS — Z90710 Acquired absence of both cervix and uterus: Secondary | ICD-10-CM

## 2014-08-23 DIAGNOSIS — E1169 Type 2 diabetes mellitus with other specified complication: Secondary | ICD-10-CM | POA: Diagnosis present

## 2014-08-23 DIAGNOSIS — K219 Gastro-esophageal reflux disease without esophagitis: Secondary | ICD-10-CM | POA: Diagnosis present

## 2014-08-23 DIAGNOSIS — N185 Chronic kidney disease, stage 5: Secondary | ICD-10-CM | POA: Diagnosis present

## 2014-08-23 DIAGNOSIS — E785 Hyperlipidemia, unspecified: Secondary | ICD-10-CM | POA: Diagnosis present

## 2014-08-23 DIAGNOSIS — N312 Flaccid neuropathic bladder, not elsewhere classified: Secondary | ICD-10-CM | POA: Diagnosis present

## 2014-08-23 DIAGNOSIS — E119 Type 2 diabetes mellitus without complications: Secondary | ICD-10-CM

## 2014-08-23 DIAGNOSIS — K5900 Constipation, unspecified: Secondary | ICD-10-CM | POA: Diagnosis present

## 2014-08-23 DIAGNOSIS — Z888 Allergy status to other drugs, medicaments and biological substances status: Secondary | ICD-10-CM

## 2014-08-23 DIAGNOSIS — R079 Chest pain, unspecified: Secondary | ICD-10-CM | POA: Diagnosis present

## 2014-08-23 DIAGNOSIS — R39198 Other difficulties with micturition: Secondary | ICD-10-CM

## 2014-08-23 DIAGNOSIS — I120 Hypertensive chronic kidney disease with stage 5 chronic kidney disease or end stage renal disease: Secondary | ICD-10-CM | POA: Diagnosis present

## 2014-08-23 DIAGNOSIS — E1122 Type 2 diabetes mellitus with diabetic chronic kidney disease: Secondary | ICD-10-CM | POA: Diagnosis present

## 2014-08-23 DIAGNOSIS — I25719 Atherosclerosis of autologous vein coronary artery bypass graft(s) with unspecified angina pectoris: Secondary | ICD-10-CM | POA: Diagnosis not present

## 2014-08-23 DIAGNOSIS — D631 Anemia in chronic kidney disease: Secondary | ICD-10-CM | POA: Diagnosis present

## 2014-08-23 DIAGNOSIS — D696 Thrombocytopenia, unspecified: Secondary | ICD-10-CM | POA: Diagnosis present

## 2014-08-23 DIAGNOSIS — I25718 Atherosclerosis of autologous vein coronary artery bypass graft(s) with other forms of angina pectoris: Secondary | ICD-10-CM | POA: Diagnosis present

## 2014-08-23 DIAGNOSIS — I10 Essential (primary) hypertension: Secondary | ICD-10-CM | POA: Diagnosis present

## 2014-08-23 DIAGNOSIS — Z7982 Long term (current) use of aspirin: Secondary | ICD-10-CM

## 2014-08-23 DIAGNOSIS — N179 Acute kidney failure, unspecified: Secondary | ICD-10-CM | POA: Diagnosis present

## 2014-08-23 DIAGNOSIS — Z794 Long term (current) use of insulin: Secondary | ICD-10-CM

## 2014-08-23 DIAGNOSIS — J449 Chronic obstructive pulmonary disease, unspecified: Secondary | ICD-10-CM | POA: Diagnosis present

## 2014-08-23 DIAGNOSIS — E1165 Type 2 diabetes mellitus with hyperglycemia: Secondary | ICD-10-CM | POA: Diagnosis present

## 2014-08-23 HISTORY — DX: Other chronic pain: G89.29

## 2014-08-23 HISTORY — DX: Low back pain: M54.5

## 2014-08-23 HISTORY — DX: Heart failure, unspecified: I50.9

## 2014-08-23 HISTORY — DX: Chronic obstructive pulmonary disease, unspecified: J44.9

## 2014-08-23 HISTORY — DX: Low back pain, unspecified: M54.50

## 2014-08-23 HISTORY — DX: Chronic kidney disease, stage 4 (severe): N18.4

## 2014-08-23 LAB — CBC WITH DIFFERENTIAL/PLATELET
Basophils Absolute: 0 10*3/uL (ref 0.0–0.1)
Basophils Relative: 0 % (ref 0–1)
Eosinophils Absolute: 0.1 10*3/uL (ref 0.0–0.7)
Eosinophils Relative: 1 % (ref 0–5)
HEMATOCRIT: 26.3 % — AB (ref 36.0–46.0)
Hemoglobin: 8.5 g/dL — ABNORMAL LOW (ref 12.0–15.0)
Lymphocytes Relative: 16 % (ref 12–46)
Lymphs Abs: 1 10*3/uL (ref 0.7–4.0)
MCH: 29.5 pg (ref 26.0–34.0)
MCHC: 32.3 g/dL (ref 30.0–36.0)
MCV: 91.3 fL (ref 78.0–100.0)
MONOS PCT: 5 % (ref 3–12)
Monocytes Absolute: 0.3 10*3/uL (ref 0.1–1.0)
Neutro Abs: 4.7 10*3/uL (ref 1.7–7.7)
Neutrophils Relative %: 78 % — ABNORMAL HIGH (ref 43–77)
Platelets: 114 10*3/uL — ABNORMAL LOW (ref 150–400)
RBC: 2.88 MIL/uL — ABNORMAL LOW (ref 3.87–5.11)
RDW: 14.4 % (ref 11.5–15.5)
WBC: 6.1 10*3/uL (ref 4.0–10.5)

## 2014-08-23 LAB — HEPATIC FUNCTION PANEL
ALK PHOS: 66 U/L (ref 39–117)
ALT: 17 U/L (ref 0–35)
AST: 24 U/L (ref 0–37)
Albumin: 3.1 g/dL — ABNORMAL LOW (ref 3.5–5.2)
Bilirubin, Direct: <0.1 mg/dL (ref 0.0–0.5)
Total Bilirubin: 0.3 mg/dL (ref 0.3–1.2)
Total Protein: 6.4 g/dL (ref 6.0–8.3)

## 2014-08-23 LAB — BASIC METABOLIC PANEL
Anion gap: 10 (ref 5–15)
BUN: 42 mg/dL — AB (ref 6–23)
CO2: 16 mmol/L — ABNORMAL LOW (ref 19–32)
Calcium: 8.7 mg/dL (ref 8.4–10.5)
Chloride: 112 mmol/L (ref 96–112)
Creatinine, Ser: 3.37 mg/dL — ABNORMAL HIGH (ref 0.50–1.10)
GFR calc Af Amer: 14 mL/min — ABNORMAL LOW (ref >90)
GFR, EST NON AFRICAN AMERICAN: 12 mL/min — AB (ref >90)
GLUCOSE: 198 mg/dL — AB (ref 70–99)
Potassium: 4.8 mmol/L (ref 3.5–5.1)
Sodium: 138 mmol/L (ref 135–145)

## 2014-08-23 LAB — URINALYSIS, ROUTINE W REFLEX MICROSCOPIC
BILIRUBIN URINE: NEGATIVE
Glucose, UA: >1000 mg/dL — AB
Hgb urine dipstick: NEGATIVE
Ketones, ur: NEGATIVE mg/dL
Leukocytes, UA: NEGATIVE
Nitrite: POSITIVE — AB
PH: 5.5 (ref 5.0–8.0)
Specific Gravity, Urine: 1.016 (ref 1.005–1.030)
UROBILINOGEN UA: 0.2 mg/dL (ref 0.0–1.0)

## 2014-08-23 LAB — URINE MICROSCOPIC-ADD ON

## 2014-08-23 LAB — I-STAT TROPONIN, ED: TROPONIN I, POC: 0.01 ng/mL (ref 0.00–0.08)

## 2014-08-23 LAB — TROPONIN I: Troponin I: <0.03 ng/mL (ref <0.031)

## 2014-08-23 LAB — I-STAT CHEM 8, ED
BUN: 42 mg/dL — ABNORMAL HIGH (ref 6–23)
Calcium, Ion: 1.17 mmol/L (ref 1.13–1.30)
Chloride: 111 mmol/L (ref 96–112)
Creatinine, Ser: 3.7 mg/dL — ABNORMAL HIGH (ref 0.50–1.10)
Glucose, Bld: 342 mg/dL — ABNORMAL HIGH (ref 70–99)
HEMATOCRIT: 26 % — AB (ref 36.0–46.0)
HEMOGLOBIN: 8.8 g/dL — AB (ref 12.0–15.0)
POTASSIUM: 5.7 mmol/L — AB (ref 3.5–5.1)
Sodium: 138 mmol/L (ref 135–145)
TCO2: 15 mmol/L (ref 0–100)

## 2014-08-23 LAB — CBG MONITORING, ED
GLUCOSE-CAPILLARY: 279 mg/dL — AB (ref 70–99)
Glucose-Capillary: 233 mg/dL — ABNORMAL HIGH (ref 70–99)
Glucose-Capillary: 153 mg/dL — ABNORMAL HIGH (ref 70–99)

## 2014-08-23 MED ORDER — SODIUM CHLORIDE 0.9 % IV SOLN: INTRAVENOUS | Status: DC

## 2014-08-23 MED ORDER — ISOSORBIDE MONONITRATE ER 60 MG PO TB24
120.0000 mg | ORAL_TABLET | Freq: Every day | ORAL | Status: DC
Start: 2014-08-24 — End: 2014-08-27
  Administered 2014-08-24 – 2014-08-27 (×4): 120 mg via ORAL
  Filled 2014-08-23 (×4): qty 2

## 2014-08-23 MED ORDER — AMLODIPINE BESYLATE 10 MG PO TABS
10.0000 mg | ORAL_TABLET | Freq: Every day | ORAL | Status: DC
  Administered 2014-08-24 – 2014-08-27 (×4): 10 mg via ORAL
  Filled 2014-08-23 (×4): qty 1

## 2014-08-23 MED ORDER — DOCUSATE SODIUM 100 MG PO CAPS
100.0000 mg | ORAL_CAPSULE | Freq: Two times a day (BID) | ORAL | Status: DC
  Administered 2014-08-24 – 2014-08-27 (×7): 100 mg via ORAL
  Filled 2014-08-23 (×7): qty 1

## 2014-08-23 MED ORDER — ALBUTEROL SULFATE (2.5 MG/3ML) 0.083% IN NEBU: 3.0000 mL | INHALATION_SOLUTION | Freq: Four times a day (QID) | RESPIRATORY_TRACT | Status: DC | PRN

## 2014-08-23 MED ORDER — ROSUVASTATIN CALCIUM 10 MG PO TABS
20.0000 mg | ORAL_TABLET | Freq: Every day | ORAL | Status: DC
  Administered 2014-08-24 – 2014-08-27 (×4): 20 mg via ORAL
  Filled 2014-08-23 (×4): qty 2

## 2014-08-23 MED ORDER — DEXTROSE 5 % IV SOLN
1.0000 g | INTRAVENOUS | Status: DC
  Administered 2014-08-23 – 2014-08-26 (×4): 1 g via INTRAVENOUS
  Filled 2014-08-23 (×5): qty 10

## 2014-08-23 MED ORDER — HEPARIN SODIUM (PORCINE) 5000 UNIT/ML IJ SOLN
5000.0000 [IU] | Freq: Three times a day (TID) | INTRAMUSCULAR | Status: DC
  Administered 2014-08-23 – 2014-08-27 (×10): 5000 [IU] via SUBCUTANEOUS
  Filled 2014-08-23 (×7): qty 1

## 2014-08-23 MED ORDER — SODIUM CHLORIDE 0.9 % IJ SOLN
3.0000 mL | Freq: Two times a day (BID) | INTRAMUSCULAR | Status: DC
  Administered 2014-08-24 – 2014-08-27 (×7): 3 mL via INTRAVENOUS

## 2014-08-23 MED ORDER — NITROGLYCERIN 0.4 MG SL SUBL: 0.4000 mg | SUBLINGUAL_TABLET | SUBLINGUAL | Status: DC | PRN

## 2014-08-23 MED ORDER — HYDRALAZINE HCL 25 MG PO TABS
25.0000 mg | ORAL_TABLET | Freq: Three times a day (TID) | ORAL | Status: DC
  Administered 2014-08-23: 25 mg via ORAL
  Filled 2014-08-23: qty 1

## 2014-08-23 MED ORDER — ASPIRIN EC 81 MG PO TBEC
81.0000 mg | DELAYED_RELEASE_TABLET | Freq: Every day | ORAL | Status: DC
  Administered 2014-08-24 – 2014-08-27 (×4): 81 mg via ORAL
  Filled 2014-08-23 (×4): qty 1

## 2014-08-23 MED ORDER — INSULIN ASPART 100 UNIT/ML ~~LOC~~ SOLN
0.0000 [IU] | Freq: Three times a day (TID) | SUBCUTANEOUS | Status: DC
  Administered 2014-08-25 (×2): 1 [IU] via SUBCUTANEOUS
  Administered 2014-08-25: 2 [IU] via SUBCUTANEOUS
  Administered 2014-08-26: 5 [IU] via SUBCUTANEOUS
  Administered 2014-08-27: 1 [IU] via SUBCUTANEOUS

## 2014-08-23 MED ORDER — SODIUM CHLORIDE 0.9 % IV SOLN
Freq: Once | INTRAVENOUS | Status: AC
  Administered 2014-08-23: 19:00:00 via INTRAVENOUS

## 2014-08-23 MED ORDER — INSULIN ASPART 100 UNIT/ML ~~LOC~~ SOLN
5.0000 [IU] | Freq: Once | SUBCUTANEOUS | Status: AC
  Administered 2014-08-23: 5 [IU] via SUBCUTANEOUS
  Filled 2014-08-23: qty 1

## 2014-08-23 MED ORDER — SODIUM CHLORIDE 0.9 % IV BOLUS (SEPSIS)
500.0000 mL | Freq: Once | INTRAVENOUS | Status: AC
  Administered 2014-08-23: 500 mL via INTRAVENOUS

## 2014-08-23 MED ORDER — FLUTICASONE PROPIONATE HFA 44 MCG/ACT IN AERO
1.0000 | INHALATION_SPRAY | Freq: Two times a day (BID) | RESPIRATORY_TRACT | Status: DC
  Administered 2014-08-24 – 2014-08-27 (×7): 1 via RESPIRATORY_TRACT
  Filled 2014-08-23: qty 10.6

## 2014-08-23 MED ORDER — PANTOPRAZOLE SODIUM 40 MG PO TBEC
40.0000 mg | DELAYED_RELEASE_TABLET | Freq: Every day | ORAL | Status: DC
  Administered 2014-08-24 – 2014-08-27 (×4): 40 mg via ORAL
  Filled 2014-08-23 (×4): qty 1

## 2014-08-23 MED ORDER — CARVEDILOL 25 MG PO TABS
25.0000 mg | ORAL_TABLET | Freq: Two times a day (BID) | ORAL | Status: DC
  Administered 2014-08-24 – 2014-08-27 (×7): 25 mg via ORAL
  Filled 2014-08-23 (×7): qty 1

## 2014-08-23 NOTE — Progress Notes (Signed)
ANTIBIOTIC CONSULT NOTE - INITIAL  Pharmacy Consult for Rocephin Indication: UTI  Allergies  Allergen Reactions  . Cozaar Other (See Comments)    Sig increase in her creatinine that resolved once it was D/C'd  Per Cr visible in epic, it seems her baseline Cr = 1.7-1.9, and the slight improvement to Cr was an outlier when compared to recent Cr.  We will do a trial of Lisinopril (and monitor for 30% rise in Cr), as she would greatly benefit from renal protection.  . Lisinopril     Cough     Patient Measurements: Height: 5' (152.4 cm) Weight: 108 lb 2 oz (49.045 kg) IBW/kg (Calculated) : 45.5  Vital Signs: Temp: 97.5 F (36.4 C) (04/22 1624) Temp Source: Oral (04/22 1624) BP: 148/73 mmHg (04/22 2030) Pulse Rate: 76 (04/22 2030) Intake/Output from previous day:   Intake/Output from this shift:    Labs:  Recent Labs  08/23/14 1625 08/23/14 1634 08/23/14 2014  WBC 6.1  --   --   HGB 8.5* 8.8*  --   PLT 114*  --   --   CREATININE  --  3.70* 3.37*   Estimated Creatinine Clearance: 9.4 mL/min (by C-G formula based on Cr of 3.37). No results for input(s): VANCOTROUGH, VANCOPEAK, VANCORANDOM, GENTTROUGH, GENTPEAK, GENTRANDOM, TOBRATROUGH, TOBRAPEAK, TOBRARND, AMIKACINPEAK, AMIKACINTROU, AMIKACIN in the last 72 hours.   Microbiology: No results found for this or any previous visit (from the past 720 hour(s)).  Medical History: Past Medical History  Diagnosis Date  . GERD (gastroesophageal reflux disease)   . Hepatitis B   . Hyperlipemia   . Hypertension   . Cervical dysplasia   . Atrophic vaginitis   . Spinal stenosis     s/p decompression  . CAD (coronary artery disease) April 2008    s/p CABG;    cath 8/12:  LM patent, pLAD occluded, OM1 30-40%, pOM2 80% and 60% after the anastomosis, pRCA occluded, S-DX occluded (old), S-OM 2 occluded (new), L-LAD patent, dLAD provided collats to the PDA; Med Rx  rec.; consider PCI to OM2 if fails med Rx  . Hx of hysterectomy    . UTI (lower urinary tract infection)   . Diverticulosis of intestine 04/29/2014    Of ascending colon noted on CT abd/ pelvis 04/20/14  . Diabetes mellitus, type 2     insulin dependent    Medications:  See EMR  Assessment: [age] yoF who presents with elevated blood sugars and AKI. UA was obtained showing evidence of a UTI. Pharmacy consulted to dose Rocephin. WBC 6.1, afebrile.  4/22 UCx >>  Goal of Therapy:  Clinical resolution of infection  Plan:  Ceftriaxone 1 g IV q24h Monitor C&S, clinical progress Pharmacy will sign off  Conception ChancyMegan Dereona Kolodny, PharmD Candidate  08/23/2014,9:37 PM

## 2014-08-23 NOTE — ED Notes (Signed)
Pt c/o mid upper chest pain onset approx 3 hours ago.  Pt st's her chest hurts when her blood pressure is high.  Also c/o shortness of breath

## 2014-08-23 NOTE — ED Notes (Signed)
CBG 233 

## 2014-08-23 NOTE — ED Notes (Signed)
Placed patient on bedside toilet but patient unable to urinate at this time.

## 2014-08-23 NOTE — ED Provider Notes (Signed)
CSN: [id number]     Arrival date & time 08/23/14  1611 History   First MD Initiated Contact with Patient 08/23/14 1742     Chief Complaint  Patient presents with  . Chest Pain      HPI   Patient is an 81 year old female from [place].  Prmary language is Khmer (Pronounced Ka-may), a [place] dialect. Interpretation is via her son. No interpreter immediately available through [facility] interpreter.  Complaint is that she had chest pain starting 3 hours ago that lasted a few hours and resolved. She is not having chest pain now. Her sugars "have been too high". Then states that several times in last few weeks she has had blood sugars over 300. They frequently. No vomiting. No diarrhea. Has not complained of fever shortness of breath.  She does have a history of coronary artery disease, status post CABG. Cath 812 showed remaining concerning lesion of OM 2, being treated medically.  She has difficulty describing her chest pain through her son's interpretation. His able to ascertain that lasted less than 2 hours. Came on at rest, and is now not present.  Past Medical History  Diagnosis Date  . GERD (gastroesophageal reflux disease)   . Hepatitis B   . Hyperlipemia   . Hypertension   . Cervical dysplasia   . Atrophic vaginitis   . Spinal stenosis     s/p decompression  . CAD (coronary artery disease) April 2008    s/p CABG;    cath 8/12:  LM patent, pLAD occluded, OM1 30-40%, pOM2 80% and 60% after the anastomosis, pRCA occluded, S-DX occluded (old), S-OM 2 occluded (new), L-LAD patent, dLAD provided collats to the PDA; Med Rx  rec.; consider PCI to OM2 if fails med Rx  . Hx of hysterectomy     daughter, Rocky Crafts, denies this hx on 08/23/2014  . UTI (lower urinary tract infection)   . Diverticulosis of intestine 04/29/2014    Of ascending colon noted on CT abd/ pelvis 04/20/14  . Diabetes mellitus, type 2     insulin dependent  . COPD (chronic obstructive pulmonary disease)     Hattie Perch  08/23/2014  . Chronic kidney disease (CKD), stage IV (severe)     Hattie Perch 08/23/2014  . CHF (congestive heart failure)     Hattie Perch 08/23/2014  . Chronic lower back pain    Past Surgical History  Procedure Laterality Date  . Esophagogastroduodenoscopy Left 04/22/2014    Procedure: ESOPHAGOGASTRODUODENOSCOPY (EGD);  Surgeon: Shirley Friar, MD;  Location: [facility] ENDOSCOPY;  Service: Endoscopy;  Laterality: Left;  . Total abdominal hysterectomy      cervical dysplasia; daughter, Rocky Crafts denies this hx on 08/23/2014  . Back surgery    . Lumbar laminectomy/decompression microdiscectomy  02/2000    Central and lateral decompression L3-4, L4-5, L5-S1./notes 09/15/2010  . Coronary artery bypass graft  April 2008    "CABG X5"  . Cardiac catheterization  08/2006; 09/2006; 2011,  12/2010    Hattie Perch 5/15/2012Marland Kitchen Hattie Perch 5/2/2012Marland Kitchen Hattie Perch 09/14/2010   No family history on file. History  Substance Use Topics  . Smoking status: Never Smoker   . Smokeless tobacco: Never Used  . Alcohol Use: No   OB History    No data available     Review of Systems  Constitutional: Negative for fever, chills, diaphoresis, appetite change and fatigue.  HENT: Negative for mouth sores, sore throat and trouble swallowing.   Eyes: Negative for visual disturbance.  Respiratory: Negative for cough, chest tightness,  shortness of breath and wheezing.   Cardiovascular: Positive for chest pain.  Gastrointestinal: Negative for nausea, vomiting, abdominal pain, diarrhea and abdominal distention.  Endocrine: Positive for polyuria. Negative for polydipsia and polyphagia.       Hi blood sugars at home  Genitourinary: Negative for dysuria, frequency and hematuria.  Musculoskeletal: Negative for gait problem.  Skin: Negative for color change, pallor and rash.  Neurological: Negative for dizziness, syncope, light-headedness and headaches.  Hematological: Does not bruise/bleed easily.  Psychiatric/Behavioral: Negative for behavioral problems  and confusion.      Allergies  Cozaar and Lisinopril  Home Medications   Prior to Admission medications   Medication Sig Start Date End Date Taking? Authorizing Provider  acetaminophen (TYLENOL) 500 MG tablet Take 2 tablets (1,000 mg total) by mouth every 8 (eight) hours as needed for mild pain. 12/25/13 12/25/14 Yes Carly J Rivet, MD  amLODipine (NORVASC) 10 MG tablet Take 1 tablet (10 mg total) by mouth daily. 08/14/14  Yes Gust Rung, DO  aspirin EC 81 MG tablet TAKE 1 TABLET BY MOUTH EVERY DAY 01/03/13  Yes Lorretta Harp, MD  carvedilol (COREG) 25 MG tablet TAKE 1 TABLET (25 MG TOTAL) BY MOUTH 2 (TWO) TIMES DAILY WITH A MEAL. 04/23/14  Yes Carly J Rivet, MD  CRESTOR 20 MG tablet TAKE 1 TABLET (20 MG TOTAL) BY MOUTH DAILY. 11/22/13  Yes Levert Feinstein, MD  docusate sodium (COLACE) 100 MG capsule Take 100 mg by mouth 2 (two) times daily.   Yes Historical Provider, MD  fluticasone (FLOVENT HFA) 44 MCG/ACT inhaler Inhale 1 puff into the lungs 2 (two) times daily. 06/19/14  Yes Harold Barban, MD  hydrALAZINE (APRESOLINE) 25 MG tablet Take 1 tablet (25 mg total) by mouth 3 (three) times daily. 06/11/13  Yes Lorretta Harp, MD  insulin aspart (NOVOLOG FLEXPEN) 100 UNIT/ML FlexPen Please take 8 units with breakfast, 8 units with lunch, and 10 units with dinner/supper 06/19/14  Yes Harold Barban, MD  isosorbide mononitrate (IMDUR) 120 MG 24 hr tablet Take 1 tablet (120 mg total) by mouth daily. 08/14/14  Yes Gust Rung, DO  omeprazole (PRILOSEC) 20 MG capsule TAKE 1 CAPSULE (20 MG TOTAL) BY MOUTH DAILY. 07/30/14  Yes Carly J Rivet, MD  PROAIR HFA 108 (90 BASE) MCG/ACT inhaler INHALE 1 PUFF EVERY 6 HOURS AS NEEDED FOR WHEEZE 04/05/14  Yes Carly Arlyce Harman, MD  B-D ULTRAFINE III SHORT PEN 31G X 8 MM MISC USE AS DIRECTED TO INJECT INSULIN 3 TIMES A DAY 03/12/14   Su Hoff, MD  BAYER MICROLET LANCETS lancets Use as instructed 08/14/14   Gust Rung, DO  calcium citrate-vitamin D (CITRACAL+D) 315-200  MG-UNIT per tablet Take 2 tablets by mouth 2 (two) times daily. Patient not taking: Reported on 08/05/2014 12/10/13   Iris Pert Rivet, MD  diclofenac sodium (VOLTAREN) 1 % GEL Apply 2 g topically 4 (four) times daily. Patient not taking: Reported on 08/23/2014 06/25/14   Iris Pert Rivet, MD  glucose blood (BAYER CONTOUR NEXT TEST) test strip 1 each by Other route as needed for other. Use 4 to 5 times daily to check blood sugar. diag code E11.9. Insulin dependent 08/14/14   Gust Rung, DO  insulin glargine (LANTUS) 100 UNIT/ML injection Inject 0.05 mLs (5 Units total) into the skin at bedtime. 08/27/14   Harold Barban, MD  nitroGLYCERIN (NITROSTAT) 0.4 MG SL tablet Place 0.4 mg under the tongue every 5 (five) minutes as needed. For chest  pain. Maximum of 3 doses    Historical Provider, MD  sennosides-docusate sodium (SENOKOT-S) 8.6-50 MG tablet Take 1 tablet by mouth daily. Patient not taking: Reported on 08/05/2014 04/11/12   Lorretta Harp, MD   BP 104/48 mmHg  Pulse 65  Temp(Src) 98.2 F (36.8 C) (Oral)  Resp 14  Ht 5' (1.524 m)  Wt 103 lb 4.8 oz (46.857 kg)  BMI 20.17 kg/m2  SpO2 99% Physical Exam  Constitutional: She is oriented to person, place, and time. She appears well-developed and well-nourished. No distress.  HENT:  Head: Normocephalic.  His membranes are not dried. Conjunctiva not pale. No scleral icterus.  Eyes: Conjunctivae are normal. Pupils are equal, round, and reactive to light. No scleral icterus.  Neck: Normal range of motion. Neck supple. No thyromegaly present.  Cardiovascular: Normal rate and regular rhythm.  Exam reveals no gallop and no friction rub.   No murmur heard. Pulmonary/Chest: Effort normal and breath sounds normal. No respiratory distress. She has no wheezes. She has no rales.  No increased work of breathing  Abdominal: Soft. Bowel sounds are normal. She exhibits no distension. There is no tenderness. There is no rebound.  Musculoskeletal: Normal range of motion.   Neurological: She is alert and oriented to person, place, and time.  Skin: Skin is warm and dry. No rash noted.  No lower extremity edema  Psychiatric: She has a normal mood and affect. Her behavior is normal.    ED Course  Procedures (including critical care time) Labs Review Labs Reviewed  CBC WITH DIFFERENTIAL/PLATELET - Abnormal; Notable for the following:    RBC 2.88 (*)    Hemoglobin 8.5 (*)    HCT 26.3 (*)    Platelets 114 (*)    Neutrophils Relative % 78 (*)    All other components within normal limits  URINALYSIS, ROUTINE W REFLEX MICROSCOPIC - Abnormal; Notable for the following:    APPearance CLOUDY (*)    Glucose, UA >1000 (*)    Protein, ur >300 (*)    Nitrite POSITIVE (*)    All other components within normal limits  BASIC METABOLIC PANEL - Abnormal; Notable for the following:    CO2 16 (*)    Glucose, Bld 198 (*)    BUN 42 (*)    Creatinine, Ser 3.37 (*)    GFR calc non Af Amer 12 (*)    GFR calc Af Amer 14 (*)    All other components within normal limits  HEPATIC FUNCTION PANEL - Abnormal; Notable for the following:    Albumin 3.1 (*)    All other components within normal limits  URINE MICROSCOPIC-ADD ON - Abnormal; Notable for the following:    Bacteria, UA MANY (*)    Casts HYALINE CASTS (*)    All other components within normal limits  BASIC METABOLIC PANEL - Abnormal; Notable for the following:    Chloride 113 (*)    CO2 18 (*)    Glucose, Bld 119 (*)    BUN 37 (*)    Creatinine, Ser 3.08 (*)    GFR calc non Af Amer 13 (*)    GFR calc Af Amer 15 (*)    All other components within normal limits  IRON AND TIBC - Abnormal; Notable for the following:    TIBC 200 (*)    All other components within normal limits  RETICULOCYTES - Abnormal; Notable for the following:    RBC. 2.53 (*)    All other components within normal  limits  CBC WITH DIFFERENTIAL/PLATELET - Abnormal; Notable for the following:    RBC 2.53 (*)    Hemoglobin 7.6 (*)    HCT 22.8  (*)    Platelets 108 (*)    All other components within normal limits  GLUCOSE, CAPILLARY - Abnormal; Notable for the following:    Glucose-Capillary 112 (*)    All other components within normal limits  GLUCOSE, CAPILLARY - Abnormal; Notable for the following:    Glucose-Capillary 196 (*)    All other components within normal limits  GLUCOSE, CAPILLARY - Abnormal; Notable for the following:    Glucose-Capillary 128 (*)    All other components within normal limits  GLUCOSE, CAPILLARY - Abnormal; Notable for the following:    Glucose-Capillary 161 (*)    All other components within normal limits  RENAL FUNCTION PANEL - Abnormal; Notable for the following:    CO2 17 (*)    BUN 38 (*)    Creatinine, Ser 3.10 (*)    Albumin 2.8 (*)    GFR calc non Af Amer 13 (*)    GFR calc Af Amer 15 (*)    All other components within normal limits  CBC - Abnormal; Notable for the following:    RBC 2.60 (*)    Hemoglobin 7.8 (*)    HCT 23.8 (*)    Platelets 108 (*)    All other components within normal limits  GLUCOSE, CAPILLARY - Abnormal; Notable for the following:    Glucose-Capillary 148 (*)    All other components within normal limits  GLUCOSE, CAPILLARY - Abnormal; Notable for the following:    Glucose-Capillary 130 (*)    All other components within normal limits  URINALYSIS, ROUTINE W REFLEX MICROSCOPIC - Abnormal; Notable for the following:    APPearance TURBID (*)    Protein, ur >300 (*)    Nitrite POSITIVE (*)    Leukocytes, UA MODERATE (*)    All other components within normal limits  URINE MICROSCOPIC-ADD ON - Abnormal; Notable for the following:    Squamous Epithelial / LPF FEW (*)    Bacteria, UA MANY (*)    All other components within normal limits  GLUCOSE, CAPILLARY - Abnormal; Notable for the following:    Glucose-Capillary 155 (*)    All other components within normal limits  GLUCOSE, CAPILLARY - Abnormal; Notable for the following:    Glucose-Capillary 114 (*)     All other components within normal limits  GLUCOSE, CAPILLARY - Abnormal; Notable for the following:    Glucose-Capillary 42 (*)    All other components within normal limits  GLUCOSE, CAPILLARY - Abnormal; Notable for the following:    Glucose-Capillary 280 (*)    All other components within normal limits  GLUCOSE, CAPILLARY - Abnormal; Notable for the following:    Glucose-Capillary 110 (*)    All other components within normal limits  GLUCOSE, CAPILLARY - Abnormal; Notable for the following:    Glucose-Capillary 125 (*)    All other components within normal limits  GLUCOSE, CAPILLARY - Abnormal; Notable for the following:    Glucose-Capillary 273 (*)    All other components within normal limits  GLUCOSE, CAPILLARY - Abnormal; Notable for the following:    Glucose-Capillary 107 (*)    All other components within normal limits  GLUCOSE, CAPILLARY - Abnormal; Notable for the following:    Glucose-Capillary 107 (*)    All other components within normal limits  GLUCOSE, CAPILLARY - Abnormal; Notable for the  following:    Glucose-Capillary 137 (*)    All other components within normal limits  I-STAT CHEM 8, ED - Abnormal; Notable for the following:    Potassium 5.7 (*)    BUN 42 (*)    Creatinine, Ser 3.70 (*)    Glucose, Bld 342 (*)    Hemoglobin 8.8 (*)    HCT 26.0 (*)    All other components within normal limits  CBG MONITORING, ED - Abnormal; Notable for the following:    Glucose-Capillary 233 (*)    All other components within normal limits  CBG MONITORING, ED - Abnormal; Notable for the following:    Glucose-Capillary 153 (*)    All other components within normal limits  CBG MONITORING, ED - Abnormal; Notable for the following:    Glucose-Capillary 279 (*)    All other components within normal limits  URINE CULTURE  URINE CULTURE  TROPONIN I  TROPONIN I  TROPONIN I  LACTIC ACID, PLASMA  VITAMIN B12  FOLATE  FERRITIN  CREATININE, URINE, RANDOM  SODIUM, URINE,  RANDOM  I-STAT TROPOININ, ED    Imaging Review No results found.   EKG Interpretation None      MDM   Final diagnoses:  UTI (lower urinary tract infection)  Difficulty in urination  DM (diabetes mellitus), type 2    Hyperglycemia, polyuria, elevation of her creatinine above her baseline of 2.6-2.8, now at 3.7. Normal troponin. Nonacute EKG. Heart score of 4+. Will need admission for rule out, fluid hydration for her mild acute kidney injury.    Rolland Porter, MD 08/28/14 [phone]

## 2014-08-23 NOTE — H&P (Signed)
Date: 08/23/2014               Patient Name:  Bianca BurnetJieh T Selway MRN: [id number]  DOB: [birth year] Age / Sex: 81 y.o., female   PCP: Su Hoffarly J Rivet, MD              Medical Service: Internal Medicine Teaching Service              Attending Physician: Dr. Doneen PoissonLawrence Klima, MD    First Contact: Dr. Loma NewtonNgo Pager: [phone]  Second Contact: Dr. Delane GingerGill Pager: [phone]            After Hours (After 5p/  First Contact Pager: [phone]  weekends / holidays): Second Contact Pager: [phone]   Chief Complaint:  Fatigue.  History of Present Illness: Bianca [name] is a 81 year old [place] woman with history of hypertension, hyperlipidemia, coronary artery disease status post CABG, type 2 diabetes, COPD, and GERD presenting with fatigue. She reports feeling generalized weakness for the last couple of days that has worsened today. She reports that her blood sugars have been running higher than normal, measuring in the 300s. She reports some difficulty urinating and suprapubic discomfort, but denies dysuria. She says that she feels like she needs to urinate, but she has difficulty voiding. She also reports developing chest pain 3 hours prior to arrival in the emergency room. She describes the pain as pressure centered in her left upper chest and lasted a couple of hours prior to resolving after coming to the emergency room. She also reports some intermittent shortness of breath associated with her chest pain. She reports increased swelling of her right leg in addition to bilateral leg soreness and chronic back and neck pain. She denies radiation of her chest pain, nausea, vomiting, or diarrhea. She reports some chronic constipation that is unchanged. She last had a catheterization in August 2012 following her CABG that showed 80% stenosis of the obtuse marginal, which has been managed medically. She has been admitted twice over the last few months for chest pain, which she says was similar to her current chest pain. She  had a Myoview performed on 05/01/2014 that was read as a low to intermediate risk study with a moderate sized infarct in the mid inferior wall with no areas of reversible ischemia. Cardiology recommended medical management at that time. Echocardiogram at that time showed worsening of her ejection fraction to 40-45%. She followed-up with her cardiologist Dr. Antoine PocheHochrein on 07/03/2014, and no changes were made to her medications.  In the ER, initial workup including troponin and EKG was negative for acute ischemia. She was given 500 mL of normal saline and 5 units of NovoLog.  Review of Systems: Review of Systems  Constitutional: Positive for malaise/fatigue. Negative for fever, chills and diaphoresis.  HENT: Negative for congestion and sore throat.   Eyes: Negative for blurred vision.  Respiratory: Negative for cough and shortness of breath.   Cardiovascular: Positive for leg swelling. Negative for chest pain, palpitations and orthopnea.  Gastrointestinal: Positive for abdominal pain and constipation. Negative for nausea, vomiting and diarrhea.  Genitourinary: Positive for frequency. Negative for dysuria and urgency.       Suprapubic discomfort and urinary retention.  Musculoskeletal: Positive for myalgias, back pain and joint pain.  Skin: Negative for rash.  Neurological: Positive for weakness. Negative for dizziness, sensory change, speech change and focal weakness.    Meds: Medications Prior to Admission  Medication Sig Dispense Refill  . acetaminophen (TYLENOL) 500 MG tablet Take  2 tablets (1,000 mg total) by mouth every 8 (eight) hours as needed for mild pain. 100 tablet 2  . amLODipine (NORVASC) 10 MG tablet Take 1 tablet (10 mg total) by mouth daily. 90 tablet 1  . aspirin EC 81 MG tablet TAKE 1 TABLET BY MOUTH EVERY DAY 150 tablet 1  . carvedilol (COREG) 25 MG tablet TAKE 1 TABLET (25 MG TOTAL) BY MOUTH 2 (TWO) TIMES DAILY WITH A MEAL. 120 tablet 3  . CRESTOR 20 MG tablet TAKE 1 TABLET  (20 MG TOTAL) BY MOUTH DAILY. 90 tablet 3  . docusate sodium (COLACE) 100 MG capsule Take 100 mg by mouth 2 (two) times daily.    . fluticasone (FLOVENT HFA) 44 MCG/ACT inhaler Inhale 1 puff into the lungs 2 (two) times daily. 1 Inhaler 12  . hydrALAZINE (APRESOLINE) 25 MG tablet Take 1 tablet (25 mg total) by mouth 3 (three) times daily. 270 tablet 5  . insulin aspart (NOVOLOG FLEXPEN) 100 UNIT/ML FlexPen Please take 8 units with breakfast, 8 units with lunch, and 10 units with dinner/supper 100 mL 9  . isosorbide mononitrate (IMDUR) 120 MG 24 hr tablet Take 1 tablet (120 mg total) by mouth daily. 90 tablet 1  . LANTUS 100 UNIT/ML injection INJECT 0.2 MLS (20 UNITS TOTAL) INTO THE SKIN AT BEDTIME. 10 mL 5  . omeprazole (PRILOSEC) 20 MG capsule TAKE 1 CAPSULE (20 MG TOTAL) BY MOUTH DAILY. 30 capsule 0  . PROAIR HFA 108 (90 BASE) MCG/ACT inhaler INHALE 1 PUFF EVERY 6 HOURS AS NEEDED FOR WHEEZE 8.5 each 2  . B-D ULTRAFINE III SHORT PEN 31G X 8 MM MISC USE AS DIRECTED TO INJECT INSULIN 3 TIMES A DAY 100 each 6  . BAYER MICROLET LANCETS lancets Use as instructed 200 each 5  . calcitRIOL (ROCALTROL) 0.25 MCG capsule Take 2 capsules (0.5 mcg total) by mouth daily. (Patient not taking: Reported on 08/23/2014) 60 capsule 5  . calcium citrate-vitamin D (CITRACAL+D) 315-200 MG-UNIT per tablet Take 2 tablets by mouth 2 (two) times daily. (Patient not taking: Reported on 08/05/2014) 100 tablet 3  . diclofenac sodium (VOLTAREN) 1 % GEL Apply 2 g topically 4 (four) times daily. (Patient not taking: Reported on 08/23/2014) 100 g 0  . glucose blood (BAYER CONTOUR NEXT TEST) test strip 1 each by Other route as needed for other. Use 4 to 5 times daily to check blood sugar. diag code E11.9. Insulin dependent 200 each 5  . nitroGLYCERIN (NITROSTAT) 0.4 MG SL tablet Place 0.4 mg under the tongue every 5 (five) minutes as needed. For chest pain. Maximum of 3 doses    . sennosides-docusate sodium (SENOKOT-S) 8.6-50 MG tablet  Take 1 tablet by mouth daily. (Patient not taking: Reported on 08/05/2014) 90 tablet 3   Current Facility-Administered Medications  Medication Dose Route Frequency Provider Last Rate Last Dose  . 0.9 %  sodium chloride infusion   Intravenous Continuous Adrian Blackwater Jaeson Molstad, MD 50 mL/hr at 08/23/14 2248    . albuterol (PROVENTIL) (2.5 MG/3ML) 0.083% nebulizer solution 3 mL  3 mL Inhalation Q6H PRN Yolanda Manges, DO      . [START ON 08/24/2014] amLODipine (NORVASC) tablet 10 mg  10 mg Oral Daily Yolanda Manges, DO      . [START ON 08/24/2014] aspirin EC tablet 81 mg  81 mg Oral Daily Yolanda Manges, DO      . [START ON 08/24/2014] carvedilol (COREG) tablet 25 mg  25 mg Oral BID  WC Yolanda Manges, DO      . cefTRIAXone (ROCEPHIN) 1 g in dextrose 5 % 50 mL IVPB  1 g Intravenous Q24H Titus Mould, RPH 100 mL/hr at 08/23/14 2148 1 g at 08/23/14 2148  . [START ON 08/24/2014] docusate sodium (COLACE) capsule 100 mg  100 mg Oral BID Yolanda Manges, DO      . fluticasone (FLOVENT HFA) 44 MCG/ACT inhaler 1 puff  1 puff Inhalation BID Yolanda Manges, DO      . heparin injection 5,000 Units  5,000 Units Subcutaneous 3 times per day Yolanda Manges, DO      . hydrALAZINE (APRESOLINE) tablet 25 mg  25 mg Oral TID Yolanda Manges, DO      . Melene Muller ON 08/24/2014] insulin aspart (novoLOG) injection 0-9 Units  0-9 Units Subcutaneous TID WC Yolanda Manges, DO      . [START ON 08/24/2014] isosorbide mononitrate (IMDUR) 24 hr tablet 120 mg  120 mg Oral Daily Yolanda Manges, DO      . nitroGLYCERIN (NITROSTAT) SL tablet 0.4 mg  0.4 mg Sublingual Q5 min PRN Yolanda Manges, DO      . Melene Muller ON 08/24/2014] pantoprazole (PROTONIX) EC tablet 40 mg  40 mg Oral Daily Yolanda Manges, DO      . Melene Muller ON 08/24/2014] rosuvastatin (CRESTOR) tablet 20 mg  20 mg Oral Daily Yolanda Manges, DO      . sodium chloride 0.9 % injection 3 mL  3 mL Intravenous Q12H Yolanda Manges, DO        Allergies: Allergies as of 08/23/2014 - Review Complete 08/23/2014    Allergen Reaction Noted  . Cozaar Other (See Comments) 02/12/2011  . Lisinopril  05/24/2012   Past Medical History  Diagnosis Date  . GERD (gastroesophageal reflux disease)   . Hepatitis B   . Hyperlipemia   . Hypertension   . Cervical dysplasia   . Atrophic vaginitis   . Spinal stenosis     s/p decompression  . CAD (coronary artery disease) April 2008    s/p CABG;    cath 8/12:  LM patent, pLAD occluded, OM1 30-40%, pOM2 80% and 60% after the anastomosis, pRCA occluded, S-DX occluded (old), S-OM 2 occluded (new), L-LAD patent, dLAD provided collats to the PDA; Med Rx  rec.; consider PCI to OM2 if fails med Rx  . Hx of hysterectomy   . UTI (lower urinary tract infection)   . Diverticulosis of intestine 04/29/2014    Of ascending colon noted on CT abd/ pelvis 04/20/14  . Diabetes mellitus, type 2     insulin dependent   Past Surgical History  Procedure Laterality Date  . Back surgery      decompression L3-S1  . Coronary artery bypass graft  April 2008  . Total abdominal hysterectomy      cervical dysplasia  . Esophagogastroduodenoscopy Left 04/22/2014    Procedure: ESOPHAGOGASTRODUODENOSCOPY (EGD);  Surgeon: Shirley Friar, MD;  Location: [facility];  Service: Endoscopy;  Laterality: Left;   No family history on file. History   Social History  . Marital Status: Widowed    Spouse Name: N/A  . Number of Children: N/A  . Years of Education: N/A   Occupational History  . Not on file.   Social History Main Topics  . Smoking status: Never Smoker   . Smokeless tobacco: Never Used  . Alcohol Use: No  . Drug Use: No  . Sexual  Activity: Not on file   Other Topics Concern  . Not on file   Social History Narrative   [place] immigrant   Housewife   Widowed   Lives w/ her son    Physical Exam: Filed Vitals:   08/23/14 2243  BP: 164/69  Pulse: 79  Temp: 97.9 F (36.6 C)  Resp: 17  99% on room air.  Physical Exam  Constitutional: She is oriented to  person, place, and time and well-developed, well-nourished, and in no distress. No distress.  HENT:  Head: Normocephalic and atraumatic.  Eyes: Conjunctivae and EOM are normal. Pupils are equal, round, and reactive to light. No scleral icterus.  Neck: Normal range of motion. Neck supple.  Cardiovascular: Normal rate, regular rhythm and normal heart sounds.   Pulmonary/Chest: Effort normal and breath sounds normal. No respiratory distress.  Abdominal: Soft. Bowel sounds are normal. She exhibits no distension. There is tenderness (suprapubic). There is no rebound and no guarding.  Musculoskeletal: Normal range of motion. She exhibits edema (1+ bilateral lower extremities and symmetric). She exhibits no tenderness.  Neurological: She is alert and oriented to person, place, and time. No cranial nerve deficit. She exhibits normal muscle tone.  Skin: Skin is warm and dry. No rash noted. She is not diaphoretic. No erythema.    Lab results: Basic Metabolic Panel:  Recent Labs  16/10/96 1634 08/23/14 2014  NA 138 138  K 5.7* 4.8  CL 111 112  CO2  --  16*  GLUCOSE 342* 198*  BUN 42* 42*  CREATININE 3.70* 3.37*  CALCIUM  --  8.7   Liver Function Tests:  Recent Labs  08/23/14 2014  AST 24  ALT 17  ALKPHOS 66  BILITOT 0.3  PROT 6.4  ALBUMIN 3.1*   CBC:  Recent Labs  08/23/14 1625 08/23/14 1634  WBC 6.1  --   NEUTROABS 4.7  --   HGB 8.5* 8.8*  HCT 26.3* 26.0*  MCV 91.3  --   PLT 114*  --    Cardiac Enzymes:  Recent Labs  08/23/14 2014  TROPONINI <0.03   CBG:  Recent Labs  08/23/14 1630 08/23/14 1923 08/23/14 2030  GLUCAP 279* 233* 153*   Urinalysis:  Recent Labs  08/23/14 1945  COLORURINE YELLOW  LABSPEC 1.016  PHURINE 5.5  GLUCOSEU >1000*  HGBUR NEGATIVE  BILIRUBINUR NEGATIVE  KETONESUR NEGATIVE  PROTEINUR >300*  UROBILINOGEN 0.2  NITRITE POSITIVE*  LEUKOCYTESUR NEGATIVE  Rare squamous epithelial cells, 0-2 white blood cells, many  bacteria.  Imaging results:  Dg Chest 2 View  08/23/2014   CLINICAL DATA:  Chest pain  EXAM: CHEST  2 VIEW  COMPARISON:  06/20/2014  FINDINGS: Prior CABG.  Negative for heart failure.  Heart size mildly enlarged  Negative for pneumonia or effusion. No mass lesion. Apical scarring bilaterally.  IMPRESSION: No active cardiopulmonary disease.   Electronically Signed   By: Marlan Palau M.D.   On: 08/23/2014 16:46    Other results: EKG: Normal sinus rhythm, borderline first-degree AV block, Q waves in II, III, and aVF, T-wave inversion in aVR, and V1, unchanged from prior.  Assessment & Plan by Problem: Principal Problem:   Chest pain Active Problems:   Hyperlipidemia   Essential hypertension   Hx of CABG '08, cath Aug 2012- med Rx   GERD   Type II diabetes mellitus with stage 4 chronic kidney disease   Normocytic anemia   Chronic combined systolic and diastolic CHF (congestive heart failure)   Acute  renal failure superimposed on stage 4 chronic kidney disease   #Chest pain Significant heart history along with similar symptoms in prior admissions. Chest pain appeared to resolve after coming to the emergency room. Recent stress testing with evidence of ischemia though nonreversible. Cardiology has been managing medically because she is unlikely to have good options for revascularization. HEART score of 5, consistent with moderate risk of mortality. She is likely having ongoing angina. Initial troponin and EKG unremarkable for acute ischemia. Low suspicion for pulmonary embolism, but we'll check lower extremity Doppler given patient's description of increased swelling in right leg. Wells score of 0. -Admit to telemetry. -Trend troponins and EKG. -Right lower extremity venous duplex. -Sublingual nitroglycerin when necessary. -PT and OT eval and treat. -Consider cardiology consult tomorrow.  #Urinary tract infection Symptoms of urinary retention along with suprapubic tenderness. Many  bacteria on urinalysis. This may be the cause of her increased fatigue and uncontrolled blood sugars over the past couple days. -Follow-up urine culture. -Ceftriaxone IV. -Normal saline at 50 mL per hour. -Check lactic acid per Dr. Delane Ginger.  #Acute on chronic kidney disease Creatinine elevated from baseline of approximately 2.0 on presentation. This may be prerenal or secondary to some obstruction given her difficulty voiding. Potassium was initially elevated on i-STAT Chem-8, but normal on repeat BMP. Question the accuracy of the initial i-STAT. -Bladder scan. -Renal ultrasound. -IV fluids as above, trending creatinine. -Repeat BMP in the morning.  #Coronary artery disease status post CABG -Continue home aspirin 81 mg daily. -Continue home Coreg 25 mg twice a day. -Continue home rosuvastatin 20 mg daily.  #Anemia Hemoglobin at baseline on presentation. Anemia panel from 06/11/2013 normal. Most likely secondary to chronic disease. -Repeat anemia panel. -Check FOBT. -Recheck CBC in the morning.  #Type 2 diabetes Takes Lantus 20 units daily at bedtime along with 8-10 units of mealtime NovoLog. -CBGs before meals at bedtime. -Sliding scale insulin sensitive.  #Hypertension Blood pressure slightly elevated in the ER. -Continue home amlodipine 10 mg daily. -Continue home hydralazine 25 mg 3 times a day. -Continue home Imdur 120 mg daily.  #COPD, GOLD stage II Pulmonary function tests from 07/09/2014 consistent with COPD, despite patient being a never smoker. FEV1 66% of predicted. -Continue home Flovent twice a day.  #Constipation Chronic. -Continue home docusate 100 mg twice a day.  #GERD -Continue home PPI.  Dispo: Disposition is deferred at this time, awaiting improvement of current medical problems. Anticipated discharge in approximately 1-3 day(s).   The patient does have a current PCP (Carly Arlyce Harman, MD), therefore will be require OPC follow-up after discharge.   The  patient does have transportation limitations that hinder transportation to clinic appointments.   Signed:  Luisa Dago, MD, PhD PGY-1 Internal Medicine Teaching Service Pager: [phone] 08/23/2014, 10:49 PM

## 2014-08-23 NOTE — ED Notes (Signed)
Attempted IV insertion x 2, another RN to attempt.

## 2014-08-24 ENCOUNTER — Observation Stay (HOSPITAL_COMMUNITY): Payer: Medicare Other

## 2014-08-24 DIAGNOSIS — E785 Hyperlipidemia, unspecified: Secondary | ICD-10-CM | POA: Diagnosis present

## 2014-08-24 DIAGNOSIS — I120 Hypertensive chronic kidney disease with stage 5 chronic kidney disease or end stage renal disease: Secondary | ICD-10-CM | POA: Diagnosis present

## 2014-08-24 DIAGNOSIS — B9689 Other specified bacterial agents as the cause of diseases classified elsewhere: Secondary | ICD-10-CM

## 2014-08-24 DIAGNOSIS — E1122 Type 2 diabetes mellitus with diabetic chronic kidney disease: Secondary | ICD-10-CM | POA: Diagnosis present

## 2014-08-24 DIAGNOSIS — N179 Acute kidney failure, unspecified: Secondary | ICD-10-CM

## 2014-08-24 DIAGNOSIS — E1165 Type 2 diabetes mellitus with hyperglycemia: Secondary | ICD-10-CM | POA: Diagnosis present

## 2014-08-24 DIAGNOSIS — K5900 Constipation, unspecified: Secondary | ICD-10-CM

## 2014-08-24 DIAGNOSIS — N312 Flaccid neuropathic bladder, not elsewhere classified: Secondary | ICD-10-CM | POA: Diagnosis present

## 2014-08-24 DIAGNOSIS — Z794 Long term (current) use of insulin: Secondary | ICD-10-CM | POA: Diagnosis not present

## 2014-08-24 DIAGNOSIS — Z951 Presence of aortocoronary bypass graft: Secondary | ICD-10-CM

## 2014-08-24 DIAGNOSIS — R339 Retention of urine, unspecified: Secondary | ICD-10-CM | POA: Diagnosis not present

## 2014-08-24 DIAGNOSIS — Z79899 Other long term (current) drug therapy: Secondary | ICD-10-CM

## 2014-08-24 DIAGNOSIS — Z888 Allergy status to other drugs, medicaments and biological substances status: Secondary | ICD-10-CM | POA: Diagnosis not present

## 2014-08-24 DIAGNOSIS — N185 Chronic kidney disease, stage 5: Secondary | ICD-10-CM | POA: Diagnosis present

## 2014-08-24 DIAGNOSIS — K219 Gastro-esophageal reflux disease without esophagitis: Secondary | ICD-10-CM

## 2014-08-24 DIAGNOSIS — D696 Thrombocytopenia, unspecified: Secondary | ICD-10-CM | POA: Diagnosis present

## 2014-08-24 DIAGNOSIS — D631 Anemia in chronic kidney disease: Secondary | ICD-10-CM | POA: Diagnosis present

## 2014-08-24 DIAGNOSIS — Z90710 Acquired absence of both cervix and uterus: Secondary | ICD-10-CM | POA: Diagnosis not present

## 2014-08-24 DIAGNOSIS — I2510 Atherosclerotic heart disease of native coronary artery without angina pectoris: Secondary | ICD-10-CM | POA: Diagnosis not present

## 2014-08-24 DIAGNOSIS — I129 Hypertensive chronic kidney disease with stage 1 through stage 4 chronic kidney disease, or unspecified chronic kidney disease: Secondary | ICD-10-CM

## 2014-08-24 DIAGNOSIS — R079 Chest pain, unspecified: Secondary | ICD-10-CM

## 2014-08-24 DIAGNOSIS — I25718 Atherosclerosis of autologous vein coronary artery bypass graft(s) with other forms of angina pectoris: Secondary | ICD-10-CM | POA: Diagnosis present

## 2014-08-24 DIAGNOSIS — J449 Chronic obstructive pulmonary disease, unspecified: Secondary | ICD-10-CM

## 2014-08-24 DIAGNOSIS — Z7982 Long term (current) use of aspirin: Secondary | ICD-10-CM | POA: Diagnosis not present

## 2014-08-24 DIAGNOSIS — I5042 Chronic combined systolic (congestive) and diastolic (congestive) heart failure: Secondary | ICD-10-CM | POA: Diagnosis present

## 2014-08-24 DIAGNOSIS — B1910 Unspecified viral hepatitis B without hepatic coma: Secondary | ICD-10-CM | POA: Diagnosis present

## 2014-08-24 DIAGNOSIS — M79604 Pain in right leg: Secondary | ICD-10-CM | POA: Diagnosis not present

## 2014-08-24 DIAGNOSIS — I25719 Atherosclerosis of autologous vein coronary artery bypass graft(s) with unspecified angina pectoris: Secondary | ICD-10-CM | POA: Diagnosis present

## 2014-08-24 DIAGNOSIS — B9620 Unspecified Escherichia coli [E. coli] as the cause of diseases classified elsewhere: Secondary | ICD-10-CM | POA: Diagnosis present

## 2014-08-24 DIAGNOSIS — D649 Anemia, unspecified: Secondary | ICD-10-CM

## 2014-08-24 DIAGNOSIS — N189 Chronic kidney disease, unspecified: Secondary | ICD-10-CM

## 2014-08-24 DIAGNOSIS — N390 Urinary tract infection, site not specified: Secondary | ICD-10-CM | POA: Insufficient documentation

## 2014-08-24 LAB — CBC WITH DIFFERENTIAL/PLATELET
BASOS PCT: 0 % (ref 0–1)
Basophils Absolute: 0 10*3/uL (ref 0.0–0.1)
Eosinophils Absolute: 0.1 10*3/uL (ref 0.0–0.7)
Eosinophils Relative: 1 % (ref 0–5)
HCT: 22.8 % — ABNORMAL LOW (ref 36.0–46.0)
Hemoglobin: 7.6 g/dL — ABNORMAL LOW (ref 12.0–15.0)
Lymphocytes Relative: 20 % (ref 12–46)
Lymphs Abs: 1.3 10*3/uL (ref 0.7–4.0)
MCH: 30 pg (ref 26.0–34.0)
MCHC: 33.3 g/dL (ref 30.0–36.0)
MCV: 90.1 fL (ref 78.0–100.0)
MONOS PCT: 7 % (ref 3–12)
Monocytes Absolute: 0.4 10*3/uL (ref 0.1–1.0)
NEUTROS ABS: 4.8 10*3/uL (ref 1.7–7.7)
NEUTROS PCT: 73 % (ref 43–77)
Platelets: 108 10*3/uL — ABNORMAL LOW (ref 150–400)
RBC: 2.53 MIL/uL — AB (ref 3.87–5.11)
RDW: 14.2 % (ref 11.5–15.5)
WBC: 6.6 10*3/uL (ref 4.0–10.5)

## 2014-08-24 LAB — GLUCOSE, CAPILLARY
GLUCOSE-CAPILLARY: 128 mg/dL — AB (ref 70–99)
GLUCOSE-CAPILLARY: 161 mg/dL — AB (ref 70–99)
Glucose-Capillary: 112 mg/dL — ABNORMAL HIGH (ref 70–99)
Glucose-Capillary: 196 mg/dL — ABNORMAL HIGH (ref 70–99)

## 2014-08-24 LAB — FOLATE: FOLATE: 5.5 ng/mL

## 2014-08-24 LAB — BASIC METABOLIC PANEL
Anion gap: 9 (ref 5–15)
BUN: 37 mg/dL — ABNORMAL HIGH (ref 6–23)
CHLORIDE: 113 mmol/L — AB (ref 96–112)
CO2: 18 mmol/L — ABNORMAL LOW (ref 19–32)
CREATININE: 3.08 mg/dL — AB (ref 0.50–1.10)
Calcium: 8.7 mg/dL (ref 8.4–10.5)
GFR calc Af Amer: 15 mL/min — ABNORMAL LOW (ref >90)
GFR calc non Af Amer: 13 mL/min — ABNORMAL LOW (ref >90)
Glucose, Bld: 119 mg/dL — ABNORMAL HIGH (ref 70–99)
Potassium: 4.6 mmol/L (ref 3.5–5.1)
Sodium: 140 mmol/L (ref 135–145)

## 2014-08-24 LAB — SODIUM, URINE, RANDOM: SODIUM UR: 127 mmol/L

## 2014-08-24 LAB — IRON AND TIBC
Iron: 45 ug/dL (ref 42–145)
Saturation Ratios: 23 % (ref 20–55)
TIBC: 200 ug/dL — AB (ref 250–470)
UIBC: 155 ug/dL (ref 125–400)

## 2014-08-24 LAB — RETICULOCYTES
RBC.: 2.53 MIL/uL — ABNORMAL LOW (ref 3.87–5.11)
RETIC COUNT ABSOLUTE: 50.6 10*3/uL (ref 19.0–186.0)
Retic Ct Pct: 2 % (ref 0.4–3.1)

## 2014-08-24 LAB — LACTIC ACID, PLASMA: Lactic Acid, Venous: 0.8 mmol/L (ref 0.5–2.0)

## 2014-08-24 LAB — TROPONIN I: Troponin I: <0.03 ng/mL (ref <0.031)

## 2014-08-24 LAB — FERRITIN: FERRITIN: 158 ng/mL (ref 10–291)

## 2014-08-24 LAB — VITAMIN B12: Vitamin B-12: 690 pg/mL (ref 211–911)

## 2014-08-24 LAB — CREATININE, URINE, RANDOM: CREATININE, URINE: 27.55 mg/dL

## 2014-08-24 MED ORDER — INSULIN GLARGINE 100 UNIT/ML ~~LOC~~ SOLN
10.0000 [IU] | Freq: Every day | SUBCUTANEOUS | Status: DC
  Administered 2014-08-24 – 2014-08-25 (×2): 10 [IU] via SUBCUTANEOUS
  Filled 2014-08-24 (×2): qty 0.1

## 2014-08-24 MED ORDER — SENNA 8.6 MG PO TABS: 2.0000 | ORAL_TABLET | Freq: Every day | ORAL | Status: DC | PRN

## 2014-08-24 MED ORDER — HYDRALAZINE HCL 50 MG PO TABS
50.0000 mg | ORAL_TABLET | Freq: Three times a day (TID) | ORAL | Status: DC
  Administered 2014-08-24 – 2014-08-26 (×6): 50 mg via ORAL
  Filled 2014-08-24 (×6): qty 1

## 2014-08-24 NOTE — Progress Notes (Signed)
Subjective:  Patient states that she is feeling better this morning. She is having some urinary retention requiring in and out catheterization. Otherwise, patient denies any dysuria or suprapubic pressure. Patient otherwise not reporting any complaints.  Objective: Vital signs in last 24 hours: Filed Vitals:   08/23/14 2200 08/23/14 2243 08/23/14 2253 08/24/14 0555  BP: 177/65 164/69  164/71  Pulse: 77 79  80  Temp:  97.9 F (36.6 C)  99.2 F (37.3 C)  TempSrc:  Oral  Oral  Resp:      Height:      Weight:   106 lb (48.081 kg)   SpO2:  100%  100%   Weight change:   Intake/Output Summary (Last 24 hours) at 08/24/14 1106 Last data filed at 08/24/14 0653  Gross per 24 hour  Intake 523.57 ml  Output   1375 ml  Net -851.43 ml    General: resting in bed, in no acute distress Cardiac: RRR, no rubs, murmurs or gallops Pulm: clear to auscultation bilaterally, moving normal volumes of air Abd: soft, nontender, nondistended, BS present, no suprapubic tenderness Ext: warm and well perfused, trace bilateral lower extremity edema Neuro: alert and oriented X3, cranial nerves II-XII grossly intact Skin: no rashes or lesions noted Psych: appropriate affect  Lab Results: Basic Metabolic Panel:  Recent Labs Lab 08/23/14 2014 08/24/14 0624  NA 138 140  K 4.8 4.6  CL 112 113*  CO2 16* 18*  GLUCOSE 198* 119*  BUN 42* 37*  CREATININE 3.37* 3.08*  CALCIUM 8.7 8.7   Liver Function Tests:  Recent Labs Lab 08/23/14 2014  AST 24  ALT 17  ALKPHOS 66  BILITOT 0.3  PROT 6.4  ALBUMIN 3.1*   No results for input(s): LIPASE, AMYLASE in the last 168 hours. No results for input(s): AMMONIA in the last 168 hours. CBC:  Recent Labs Lab 08/23/14 1625 08/23/14 1634 08/24/14 0355  WBC 6.1  --  6.6  NEUTROABS 4.7  --  4.8  HGB 8.5* 8.8* 7.6*  HCT 26.3* 26.0* 22.8*  MCV 91.3  --  90.1  PLT 114*  --  108*   Cardiac Enzymes:  Recent Labs Lab 08/23/14 2014 08/24/14 0115  08/24/14 0624  TROPONINI <0.03 <0.03 <0.03   BNP: No results for input(s): PROBNP in the last 168 hours. D-Dimer: No results for input(s): DDIMER in the last 168 hours. CBG:  Recent Labs Lab 08/23/14 1630 08/23/14 1923 08/23/14 2030 08/24/14 0741  GLUCAP 279* 233* 153* 112*   Hemoglobin A1C: No results for input(s): HGBA1C in the last 168 hours. Fasting Lipid Panel: No results for input(s): CHOL, HDL, LDLCALC, TRIG, CHOLHDL, LDLDIRECT in the last 168 hours. Thyroid Function Tests: No results for input(s): TSH, T4TOTAL, FREET4, T3FREE, THYROIDAB in the last 168 hours. Coagulation: No results for input(s): LABPROT, INR in the last 168 hours. Anemia Panel:  Recent Labs Lab 08/24/14 0115  RETICCTPCT 2.0   Urine Drug Screen: Drugs of Abuse  No results found for: LABOPIA, COCAINSCRNUR, LABBENZ, AMPHETMU, THCU, LABBARB  Alcohol Level: No results for input(s): ETH in the last 168 hours. Urinalysis:  Recent Labs Lab 08/23/14 1945  COLORURINE YELLOW  LABSPEC 1.016  PHURINE 5.5  GLUCOSEU >1000*  HGBUR NEGATIVE  BILIRUBINUR NEGATIVE  KETONESUR NEGATIVE  PROTEINUR >300*  UROBILINOGEN 0.2  NITRITE POSITIVE*  LEUKOCYTESUR NEGATIVE   Micro Results: No results found for this or any previous visit (from the past 240 hour(s)). Studies/Results: Dg Chest 2 View  08/23/2014  CLINICAL DATA:  Chest pain  EXAM: CHEST  2 VIEW  COMPARISON:  06/20/2014  FINDINGS: Prior CABG.  Negative for heart failure.  Heart size mildly enlarged  Negative for pneumonia or effusion. No mass lesion. Apical scarring bilaterally.  IMPRESSION: No active cardiopulmonary disease.   Electronically Signed   By: Marlan Palau M.D.   On: 08/23/2014 16:46   US Renal  08/24/2014   CLINICAL DATA:  Urinary tract infection. Difficulty urinating. History of hypertension and diabetes and chronic kidney disease, stage IV.  EXAM: RENAL/URINARY TRACT ULTRASOUND COMPLETE  COMPARISON:  03/25/2014  FINDINGS: Right  Kidney:  Length: 9.3 cm. Kidney is echogenic. Lower pole cyst measuring 19 mm. No other renal masses. No hydronephrosis.  Left Kidney:  Length: 9.1 cm. Kidney is echogenic. Midpole cyst measuring 2.3 cm. No other renal masses. No hydronephrosis.  Bladder:  Appears normal for degree of bladder distention.  IMPRESSION: 1. No hydronephrosis.  No acute finding. 2. Echogenic kidneys consistent with medical renal disease. 3. Bilateral renal cysts, similar to the prior ultrasound.   Electronically Signed   By: Amie Portland M.D.   On: 08/24/2014 09:04   Medications: I have reviewed the patient's current medications. Scheduled Meds: . amLODipine  10 mg Oral Daily  . aspirin EC  81 mg Oral Daily  . carvedilol  25 mg Oral BID WC  . cefTRIAXone (ROCEPHIN)  IV  1 g Intravenous Q24H  . docusate sodium  100 mg Oral BID  . fluticasone  1 puff Inhalation BID  . heparin  5,000 Units Subcutaneous 3 times per day  . hydrALAZINE  50 mg Oral TID  . insulin aspart  0-9 Units Subcutaneous TID WC  . isosorbide mononitrate  120 mg Oral Daily  . pantoprazole  40 mg Oral Daily  . rosuvastatin  20 mg Oral Daily  . sodium chloride  3 mL Intravenous Q12H   Continuous Infusions: . sodium chloride 50 mL/hr at 08/23/14 2248   PRN Meds:.albuterol, nitroGLYCERIN Assessment/Plan: Principal Problem:   Chest pain Active Problems:   Essential hypertension   GERD   Type II diabetes mellitus with stage 4 chronic kidney disease   Chronic combined systolic and diastolic CHF (congestive heart failure)   COPD (chronic obstructive pulmonary disease)   Hyperlipidemia   Hx of CABG '08, cath Aug 2012- med Rx   Normocytic anemia   Acute renal failure superimposed on stage 4 chronic kidney disease   UTI (urinary tract infection)   Thrombocytopenia   Urinary tract infection complicated by urinary retention: Patient states that she is feeling better with no suprapubic tenderness or dysuria status post 1 dose of ceftriaxone  yesterday. Patient continues to be afebrile with no leukocytosis. However, patient is having urinary retention requiring in and out catheterization. Review of the record reveals that the patient has had urinary retention associated with urinary tract infections multiple times in the past. The urinary retention has resolved spontaneously in the past after antibiotic treatment. Renal ultrasound with no evidence of hydronephrosis. It is unclear whether patient has underlying urinary retention that increases her risk for multiple urinary tract infections or whether her urinary tract infections usually manifest as urinary retention. -Continue ceftriaxone 1 g IV daily -Discontinue fluids as patient has decent oral intake -Follow-up urine culture. -Ambulate patient -In and out catheterization as needed -Consider outpatient follow-up with urology to assess for anatomical etiologies for urinary retention.  Chest pain in the setting of history of coronary artery disease status post CABG: Chest  pain is resolved this morning and had resolved upon initial evaluation in the emergency department. Patient has recent stress testing with evidence of nonreversible ischemia and cardiology has been managing her medically. Troponins have been negative 3. No concerning changes on EKG. Low suspicion for pulmonary embolism -Lower extremity Doppler pending given initial concern for unilateral leg edema. None present on current exam.  Acute on chronic kidney disease: Creatinine has trended down from 3.4 to 3.1 this morning. Patient has a baseline creatinine of around 2s. Renal ultrasound unremarkable for any hydronephrosis. -Discontinue IV fluids given good oral intake. -Continue home aspirin 81 mg daily. -Continue home Coreg 25 mg twice a day. -Continue home rosuvastatin 20 mg daily.  Normocytic anemia: Hemoglobin has trended down to 7.6 from 8.8. Patient currently asymptomatic. -Anemia panel pending. -FOBT  pending. -Continue to monitor.  Type 2 diabetes: Blood glucoses have been well controlled in the low 100s overnight. Takes Lantus 20 units daily at bedtime along with 8-10 units of mealtime NovoLog. -CBGs before meals at bedtime. -Sliding scale insulin sensitive.  Hypertension: Patient's blood pressure remains elevated between 143-177/64-65. Though there may be some association between hydralazine and urinary retention, I was unable to find this association upon my review. The incidence may be low. -Continue home amlodipine 10 mg daily. -Increase home hydralazine 50 mg 3 times a day. -Continue home Imdur 120 mg daily.  COPD, GOLD stage II: Pulmonary function tests from 07/09/2014 consistent with COPD, despite patient being a never smoker. FEV1 66% of predicted. -Continue home Flovent twice a day.  Constipation: Chronic. -Continue home docusate 100 mg twice a day.  GERD: No acute issues. -Continue home Protonix 40 mg daily.  Dispo: Disposition is deferred at this time, awaiting improvement of current medical problems. Anticipated discharge in approximately 1-3 day(s).   The patient does have a current PCP (Carly Arlyce HarmanJ Rivet, MD), therefore will be require [facility]PC follow-up after discharge.   The patient does have transportation limitations that hinder transportation to clinic appointments.      Services Needed at time of discharge: Y = Yes, Blank = No PT:   OT:   RN:   Equipment:   Other:    Harold BarbanLawrence Levester Waldridge, MD 08/24/2014, 11:06 AM

## 2014-08-24 NOTE — Progress Notes (Signed)
Patient voided 50mL in bedside commode, post void bladder scan completed equaling 304mL.  Internal Medicine paged, RN updated Dr. Glenard HaringModing.  Dr. Glenard HaringModing instructed RN to in and out cath patient.

## 2014-08-24 NOTE — H&P (Signed)
Internal Medicine Attending Admission Note Date: 08/24/2014  Patient name: Bianca [name] Medical record number: [id number] Date of birth: Aug 08, 1930 Age: 81 y.o. Gender: female  I saw and evaluated the patient. I reviewed the resident's note and I agree with the resident's findings and plan as documented in the resident's note.  Chief Complaint(s): Fatigue and generalized weakness  History - key components related to admission:  Bianca [name] is an 81 year old woman with a history of hypertension, hyperlipidemia, type 2 diabetes, stage V chronic kidney disease, coronary artery disease status post bypass grafting, and obstructive lung disease who presents with a several day history of progressive fatigue and generalized weakness. She has had difficulty urinating and this has been associated with suprapubic discomfort but no dysuria, fevers, shakes, or chills. She has had similar episodes of urinary retention associated with urinary tract infections in the past that have clinically resolved with treatment. She has also experienced recurrent chest pain associated with intermittent shortness of breath. This pain is similar to her previous chest pain resulting in recent admissions. She has known coronary artery disease and an 80% stenosis of the obtuse marginal which has been treated medically. Recent nuclear stress testing failed to reveal reversible ischemia and efforts have been made to control her blood pressure. She was admitted to the internal medicine teaching service for further evaluation and care.  Since admission she has required in and out catheterizations for urinary retention as seen on bladder scanning. Her urinary tract infection has been treated with IV antibiotics and she feels much improved today with less fatigue, generalized weakness, and no chest pain. She has also ruled out for myocardial infarction with serial enzymes and ECGs. She is without other complaints at this time.  Physical Exam  - key components related to admission:  Filed Vitals:   08/23/14 2200 08/23/14 2243 08/23/14 2253 08/24/14 0555  BP: 177/65 164/69  164/71  Pulse: 77 79  80  Temp:  97.9 F (36.6 C)  99.2 F (37.3 C)  TempSrc:  Oral  Oral  Resp:      Height:      Weight:   106 lb (48.081 kg)   SpO2:  100%  100%   Gen.: Well-developed, well-nourished, woman lying comfortably in bed in no acute distress. Lungs: Clear to auscultation bilaterally without wheezes, rhonchi, or rales. Heart: Regular rate and rhythm without murmurs, rubs, or gallops. Abdomen: Soft, nontender. Extremities: Without edema.  Lab results:  Basic Metabolic Panel:  Recent Labs  40/98/11 2014 08/24/14 0624  NA 138 140  K 4.8 4.6  CL 112 113*  CO2 16* 18*  GLUCOSE 198* 119*  BUN 42* 37*  CREATININE 3.37* 3.08*  CALCIUM 8.7 8.7   Liver Function Tests:  Recent Labs  08/23/14 2014  AST 24  ALT 17  ALKPHOS 66  BILITOT 0.3  PROT 6.4  ALBUMIN 3.1*   CBC:  Recent Labs  08/23/14 1625 08/23/14 1634 08/24/14 0355  WBC 6.1  --  6.6  NEUTROABS 4.7  --  4.8  HGB 8.5* 8.8* 7.6*  HCT 26.3* 26.0* 22.8*  MCV 91.3  --  90.1  PLT 114*  --  108*   Cardiac Enzymes:  Recent Labs  08/23/14 2014 08/24/14 0115 08/24/14 0624  TROPONINI <0.03 <0.03 <0.03   CBG:  Recent Labs  08/23/14 1630 08/23/14 1923 08/23/14 2030 08/24/14 0741  GLUCAP 279* 233* 153* 112*   Anemia Panel:  Recent Labs  08/24/14 0115  RETICCTPCT 2.0   Urinalysis:  Cloudy, specific gravity 1.016, pH 5.5, glucose greater than 1000, protein greater than 300, nitrite positive, leukocyte negative, 0-2 white blood cells per high-power field, many bacteria.  Misc. Labs:  Lactic acid 0.8 Urine culture: Pending  Imaging results:  Dg Chest 2 View  08/23/2014   CLINICAL DATA:  Chest pain  EXAM: CHEST  2 VIEW  COMPARISON:  06/20/2014  FINDINGS: Prior CABG.  Negative for heart failure.  Heart size mildly enlarged  Negative for pneumonia or  effusion. No mass lesion. Apical scarring bilaterally.  IMPRESSION: No active cardiopulmonary disease.   Electronically Signed   By: Marlan Palau M.D.   On: 08/23/2014 16:46   US Renal  08/24/2014   CLINICAL DATA:  Urinary tract infection. Difficulty urinating. History of hypertension and diabetes and chronic kidney disease, stage IV.  EXAM: RENAL/URINARY TRACT ULTRASOUND COMPLETE  COMPARISON:  03/25/2014  FINDINGS: Right Kidney:  Length: 9.3 cm. Kidney is echogenic. Lower pole cyst measuring 19 mm. No other renal masses. No hydronephrosis.  Left Kidney:  Length: 9.1 cm. Kidney is echogenic. Midpole cyst measuring 2.3 cm. No other renal masses. No hydronephrosis.  Bladder:  Appears normal for degree of bladder distention.  IMPRESSION: 1. No hydronephrosis.  No acute finding. 2. Echogenic kidneys consistent with medical renal disease. 3. Bilateral renal cysts, similar to the prior ultrasound.   Electronically Signed   By: Amie Portland M.D.   On: 08/24/2014 09:04   Other results:  EKG: Normal sinus rhythm at 73 bpm, normal axis, normal intervals, inferior Q waves, no LVH by voltage, nonspecific ST-T changes laterally. ECG unchanged from the previous ECG on 06/20/2014.  Assessment & Plan by Problem:  Bianca [name] is an 81 year old woman with a history of hypertension, hyperlipidemia, stage V chronic kidney disease, type 2 diabetes mellitus, coronary artery disease status post bypass grafting, and obstructive lung disease who presents with generalized weakness and urinary retention. The likely cause of these symptoms include a urinary tract infection which has been associated with urinary retention in the past. Her chest pain represents chronic stable angina and she has ruled out for a myocardial infarction. Treatment of her cardiac disease centers around control of her risk factors. Despite having stage V chronic kidney disease her potassium is well controlled and she does not appear to be uremic at this  time. The current issue keeping her in the hospital is the urinary retention which requires monitoring. It is hoped, with IV antibiotics this improves.  1) Urinary tract infection with retention: She is being treated with IV antibiotics and is undergoing periodic bladder scans. In and out catheterization will be done for any evidence of urinary retention. We will follow-up on the urine culture and, if positive, the sensitivities to help direct further oral antibiotic therapy. We are hopeful her urinary retention will improve and she will be ready for discharge in the next 24-48 hours.  2) Chronic stable angina: We will continue with the carvedilol, amlodipine, Imdur and aspirin for her chronic stable angina. We will also aggressively address her hypertension by increasing the hydralazine to 50 mg by mouth 3 times a day. No further cardiac evaluation is necessary at this time as the decision has been made to manage her medically.  3) Stage V chronic kidney disease: She has no acute indication for dialysis at this time. She is followed by a nephrologist in the outpatient setting. We will avoid neurotoxins as much as possible and have her follow-up with nephrology as an outpatient to  discuss her candidacy for hemodialysis should that eventually become required.  4) Disposition: Once her urinary retention improves she should be stable for discharge home with follow-up in the [facility]. I anticipate discharge will occur in the next 24-48 hours.

## 2014-08-24 NOTE — Progress Notes (Signed)
Pt voided 250cc, PVR 600, md notifed, orders to walk pt and encourage oral fluids, will continue to monitor

## 2014-08-24 NOTE — Progress Notes (Signed)
Pt voided 150 out with a PVR of 300, encouraging oral fluids, pt ambulated down hall and back x1 today, this seemed to help as well, will continue to monitor.

## 2014-08-24 NOTE — Progress Notes (Signed)
UR completed 

## 2014-08-24 NOTE — Evaluation (Signed)
Physical Therapy Evaluation Patient Details Name: Bianca BurnetJieh T Schwanke MRN: [id number] DOB: 09-21-1930 Today's Date: 08/24/2014   History of Present Illness  Adm with chest pain with cardiac workup negative; RLE doppler pending (however Wells score=0, therefore proceeded with eval).PMHX-CAD, CABG, DM, COPD, CHF  Clinical Impression  Patient evaluated by Physical Therapy with no further acute PT needs identified. Pt's primary caregiver (daughter) present and reports pt is at her baseline. She denies needing any further equipment or PT, however is interested in get home health aide to assist with pt's care. PT is signing off. Thank you for this referral.     Follow Up Recommendations No PT follow up;Supervision - Intermittent    Equipment Recommendations  None recommended by PT    Recommendations for Other Services       Precautions / Restrictions Precautions Precautions: Fall Precaution Comments: Daughter denies falls      Mobility  Bed Mobility Overal bed mobility: Modified Independent                Transfers Overall transfer level: Needs assistance Equipment used: Rolling walker (2 wheeled) Transfers: Sit to/from Stand Sit to Stand: Min guard         General transfer comment: x 2; no cues needed or assist  Ambulation/Gait Ambulation/Gait assistance: Min guard Ambulation Distance (Feet): 150 Feet Assistive device: Rolling walker (2 wheeled) Gait Pattern/deviations: Step-through pattern;Decreased stride length   Gait velocity interpretation: Below normal speed for age/gender General Gait Details: RW slightly too tall for pt and she tended to push it too far ahead of her  Stairs            Wheelchair Mobility    Modified Rankin (Stroke Patients Only)       Balance                                             Pertinent Vitals/Pain Pain Assessment: No/denies pain    Home Living Family/patient expects to be discharged to:: Private  residence Living Arrangements: Children;Other relatives Available Help at Discharge: Available PRN/intermittently (nearly 24/7 per daughter)           Home Equipment: Dan HumphreysWalker - 2 wheels      Prior Function Level of Independence: Needs assistance   Gait / Transfers Assistance Needed: occasional assist for unsteadiness when having a "bad day" per daughter           Hand Dominance        Extremity/Trunk Assessment   Upper Extremity Assessment: Overall WFL for tasks assessed           Lower Extremity Assessment: Overall WFL for tasks assessed      Cervical / Trunk Assessment: Normal  Communication   Communication: Prefers language other than [place] ([place]; daughter fluent [place] (primary caregiver))  Cognition Arousal/Alertness: Awake/alert Behavior During Therapy: WFL for tasks assessed/performed Overall Cognitive Status: Difficult to assess                      General Comments      Exercises        Assessment/Plan    PT Assessment Patent does not need any further PT services  PT Diagnosis Generalized weakness   PT Problem List    PT Treatment Interventions     PT Goals (Current goals can be found in the Care Plan section) Acute  Rehab PT Goals Patient Stated Goal: Daughter hopes to get additional help caring for pt PT Goal Formulation: All assessment and education complete, DC therapy    Frequency     Barriers to discharge        Co-evaluation               End of Session Equipment Utilized During Treatment: Gait belt Activity Tolerance: Patient tolerated treatment well Patient left: in chair;with family/visitor present           Time: 9629-5284 PT Time Calculation (min) (ACUTE ONLY): 12 min   Charges:   PT Evaluation $Initial PT Evaluation Tier I: 1 Procedure     PT G Codes:        Averianna Brugger 08-25-14, 4:00 PM Pager [phone]

## 2014-08-25 DIAGNOSIS — M79604 Pain in right leg: Secondary | ICD-10-CM

## 2014-08-25 LAB — URINALYSIS, ROUTINE W REFLEX MICROSCOPIC
Bilirubin Urine: NEGATIVE
Glucose, UA: NEGATIVE mg/dL
Hgb urine dipstick: NEGATIVE
Ketones, ur: NEGATIVE mg/dL
Nitrite: POSITIVE — AB
Protein, ur: >300 mg/dL — AB
Specific Gravity, Urine: 1.011 (ref 1.005–1.030)
Urobilinogen, UA: 0.2 mg/dL (ref 0.0–1.0)
pH: 5.5 (ref 5.0–8.0)

## 2014-08-25 LAB — CBC
HCT: 23.8 % — ABNORMAL LOW (ref 36.0–46.0)
HEMOGLOBIN: 7.8 g/dL — AB (ref 12.0–15.0)
MCH: 30 pg (ref 26.0–34.0)
MCHC: 32.8 g/dL (ref 30.0–36.0)
MCV: 91.5 fL (ref 78.0–100.0)
Platelets: 108 10*3/uL — ABNORMAL LOW (ref 150–400)
RBC: 2.6 MIL/uL — ABNORMAL LOW (ref 3.87–5.11)
RDW: 14.2 % (ref 11.5–15.5)
WBC: 6.4 10*3/uL (ref 4.0–10.5)

## 2014-08-25 LAB — GLUCOSE, CAPILLARY
GLUCOSE-CAPILLARY: 148 mg/dL — AB (ref 70–99)
GLUCOSE-CAPILLARY: 130 mg/dL — AB (ref 70–99)
Glucose-Capillary: 155 mg/dL — ABNORMAL HIGH (ref 70–99)
Glucose-Capillary: 114 mg/dL — ABNORMAL HIGH (ref 70–99)

## 2014-08-25 LAB — URINE CULTURE: Colony Count: >=100000

## 2014-08-25 LAB — RENAL FUNCTION PANEL
ANION GAP: 11 (ref 5–15)
Albumin: 2.8 g/dL — ABNORMAL LOW (ref 3.5–5.2)
BUN: 38 mg/dL — AB (ref 6–23)
CO2: 17 mmol/L — ABNORMAL LOW (ref 19–32)
CREATININE: 3.1 mg/dL — AB (ref 0.50–1.10)
Calcium: 8.6 mg/dL (ref 8.4–10.5)
Chloride: 112 mmol/L (ref 96–112)
GFR, EST AFRICAN AMERICAN: 15 mL/min — AB (ref >90)
GFR, EST NON AFRICAN AMERICAN: 13 mL/min — AB (ref >90)
Glucose, Bld: 98 mg/dL (ref 70–99)
Phosphorus: 4.5 mg/dL (ref 2.3–4.6)
Potassium: 4.8 mmol/L (ref 3.5–5.1)
Sodium: 140 mmol/L (ref 135–145)

## 2014-08-25 LAB — URINE MICROSCOPIC-ADD ON

## 2014-08-25 MED ORDER — SENNA 8.6 MG PO TABS
2.0000 | ORAL_TABLET | Freq: Once | ORAL | Status: AC
  Administered 2014-08-25: 17.2 mg via ORAL
  Filled 2014-08-25: qty 2

## 2014-08-25 NOTE — Progress Notes (Addendum)
Occupational Therapy Evaluation Patient Details Name: Bianca BurnetJieh T Mastel MRN: [id number] DOB: 1931-01-03 Today's Date: 08/25/2014    History of Present Illness Adm with chest pain with cardiac workup negative; RLE doppler -. PMHX-CAD, CABG, DM, COPD, CHF   Clinical Impression   Pt making good progress. Ambulating with min guard using occasional HHA. Recommend 3 in 1 use for home to reduce risk of falls during self care. Completed education with family/pt. Pt appropriate for D/C home with intital 24/7 S when medically stable. OT signing off.     Follow Up Recommendations  No OT follow up;Supervision/Assistance - 24 hour    Equipment Recommendations  3 in 1 bedside comode    Recommendations for Other Services       Precautions / Restrictions Precautions Precautions: Fall      Mobility Bed Mobility Overal bed mobility: Independent                Transfers Overall transfer level: Needs assistance Equipment used: 1 person hand held assist Transfers: Sit to/from Stand;Stand Pivot Transfers Sit to Stand: Supervision Stand pivot transfers: Min guard            Balance Overall balance assessment: Needs assistance         Standing balance support: During functional activity Standing balance-Leahy Scale: Fair                              ADL Overall ADL's : Needs assistance/impaired                                     Functional mobility during ADLs: Min guard General ADL Comments: Feel that pt is returning to baseline; Discussed recommendation of using 3 in 1 to reduce risk of falls when toileting and bathing. Educated family on useof 3 in 1 for tub, over toilet and by bed at night. Also discussed recommendation for S during all ADL and min A for shower transfer. Rec family install grab bar at tub.                      Pertinent Vitals/Pain Pain Assessment: Faces Faces Pain Scale: Hurts a little bit Pain Location: thighs when  walking Pain Descriptors / Indicators: Aching Pain Intervention(s): Limited activity within patient's tolerance     Hand Dominance Right   Extremity/Trunk Assessment Upper Extremity Assessment Upper Extremity Assessment: Overall WFL for tasks assessed   Lower Extremity Assessment Lower Extremity Assessment: Overall WFL for tasks assessed   Cervical / Trunk Assessment Cervical / Trunk Assessment: Normal   Communication Communication Communication: Prefers language other than English   Cognition Arousal/Alertness: Awake/alert Behavior During Therapy: WFL for tasks assessed/performed Overall Cognitive Status: Within Functional Limits for tasks assessed (Family states she is at her baseline)                                        Home Living Family/patient expects to be discharged to:: Private residence Living Arrangements: Children;Other relatives Available Help at Discharge: Available 24 hours/day Type of Home: House Home Access: Stairs to enter Entrance Stairs-Number of Steps: 3 Entrance Stairs-Rails: None Home Layout: One level     Bathroom Shower/Tub: Tub/shower unit Shower/tub characteristics: Engineer, building servicesCurtain Bathroom Toilet: Standard     Home  Equipment: Dan Humphreys - 2 wheels          Prior Functioning/Environment Level of Independence: Needs assistance        Comments: grand daughter states she has a RW at home if needed but that she "walks on her own"    OT Diagnosis: Generalized weakness   OT Problem List: Decreased strength   OT Treatment/Interventions:      OT Goals(Current goals can be found in the care plan section) Acute Rehab OT Goals Patient Stated Goal: none stated OT Goal Formulation: All assessment and education complete, DC therapy  OT Frequency:     Barriers to D/C:            Co-evaluation              End of Session Equipment Utilized During Treatment: Gait belt Nurse Communication: Mobility status  Activity  Tolerance: Patient tolerated treatment well Patient left: in bed;with call bell/phone within reach;with family/visitor present   Time: 1610-9604 OT Time Calculation (min): 25 min Charges:  OT General Charges $OT Visit: 1 Procedure OT Evaluation $Initial OT Evaluation Tier I: 1 Procedure OT Treatments $Self Care/Home Management : 8-22 mins G-Codes:    Janneth Krasner,HILLARY 09-19-2014, 5:01 PM   [facility], OTR/L  [phone] Sep 19, 2014

## 2014-08-25 NOTE — Progress Notes (Signed)
VASCULAR LAB PRELIMINARY  PRELIMINARY  PRELIMINARY  PRELIMINARY  Right lower extremity venous Doppler completed.    Preliminary report:  There is no DVT or SVT noted in the right lower extremity.   Philomene Haff, RVT 08/25/2014, 11:40 AM

## 2014-08-25 NOTE — Progress Notes (Addendum)
Subjective:  Pt was up walking with a relative today.  States she feels much better and denies suprapubic pain.  RN states she has less urinary retention today.  No CP, SOB.  Eating well but has not had a BM yet since Friday.    Objective: Vital signs in last 24 hours: Filed Vitals:   08/24/14 2049 08/25/14 0331 08/25/14 0422 08/25/14 0912  BP: 127/62  145/65   Pulse: 72  73   Temp: 99.5 F (37.5 C)  99 F (37.2 C)   TempSrc: Oral  Oral   Resp: 20  20   Height:      Weight:  46.494 kg (102 lb 8 oz)    SpO2: 100%  100% 99%   Weight change: -2.551 kg (-5 lb 10 oz)  Intake/Output Summary (Last 24 hours) at 08/25/14 1150 Last data filed at 08/25/14 1000  Gross per 24 hour  Intake    890 ml  Output   2350 ml  Net  -1460 ml    General: walking in the halls in NAD  Cardiac: RRR, no rubs, murmurs or gallops Pulm: CTAB Abd: soft, nontender, nondistended, BS present, no suprapubic tenderness Ext: warm and well perfused, no BLE Neuro: alert and oriented X3, cranial nerves II-XII grossly intact Skin: no rashes or lesions noted Psych: appropriate affect  Lab Results: Basic Metabolic Panel:  Recent Labs Lab 08/24/14 0624 08/25/14 0355  NA 140 140  K 4.6 4.8  CL 113* 112  CO2 18* 17*  GLUCOSE 119* 98  BUN 37* 38*  CREATININE 3.08* 3.10*  CALCIUM 8.7 8.6  PHOS  --  4.5   Liver Function Tests:  Recent Labs Lab 08/23/14 2014 08/25/14 0355  AST 24  --   ALT 17  --   ALKPHOS 66  --   BILITOT 0.3  --   PROT 6.4  --   ALBUMIN 3.1* 2.8*   No results for input(s): LIPASE, AMYLASE in the last 168 hours. No results for input(s): AMMONIA in the last 168 hours. CBC:  Recent Labs Lab 08/23/14 1625  08/24/14 0355 08/25/14 0355  WBC 6.1  --  6.6 6.4  NEUTROABS 4.7  --  4.8  --   HGB 8.5*  < > 7.6* 7.8*  HCT 26.3*  < > 22.8* 23.8*  MCV 91.3  --  90.1 91.5  PLT 114*  --  108* 108*  < > = values in this interval not displayed. Cardiac Enzymes:  Recent Labs Lab  08/23/14 2014 08/24/14 0115 08/24/14 0624  TROPONINI <0.03 <0.03 <0.03   BNP: No results for input(s): PROBNP in the last 168 hours. D-Dimer: No results for input(s): DDIMER in the last 168 hours. CBG:  Recent Labs Lab 08/23/14 2030 08/24/14 0741 08/24/14 1151 08/24/14 1712 08/24/14 2049 08/25/14 0759  GLUCAP 153* 112* 196* 128* 161* 148*   Hemoglobin A1C: No results for input(s): HGBA1C in the last 168 hours. Fasting Lipid Panel: No results for input(s): CHOL, HDL, LDLCALC, TRIG, CHOLHDL, LDLDIRECT in the last 168 hours. Thyroid Function Tests: No results for input(s): TSH, T4TOTAL, FREET4, T3FREE, THYROIDAB in the last 168 hours. Coagulation: No results for input(s): LABPROT, INR in the last 168 hours. Anemia Panel:  Recent Labs Lab 08/24/14 0115  VITAMINB12 690  FOLATE 5.5  FERRITIN 158  TIBC 200*  IRON 45  RETICCTPCT 2.0   Urine Drug Screen: Drugs of Abuse  No results found for: LABOPIA, COCAINSCRNUR, LABBENZ, AMPHETMU, THCU, LABBARB  Alcohol Level:  No results for input(s): ETH in the last 168 hours. Urinalysis:  Recent Labs Lab 08/23/14 1945  COLORURINE YELLOW  LABSPEC 1.016  PHURINE 5.5  GLUCOSEU >1000*  HGBUR NEGATIVE  BILIRUBINUR NEGATIVE  KETONESUR NEGATIVE  PROTEINUR >300*  UROBILINOGEN 0.2  NITRITE POSITIVE*  LEUKOCYTESUR NEGATIVE   Micro Results: Recent Results (from the past 240 hour(s))  Urine culture     Status: None   Collection Time: 08/23/14  7:45 PM  Result Value Ref Range Status   Specimen Description URINE, CATHETERIZED  Final   Special Requests NONE  Final   Colony Count   Final    >=100,000 COLONIES/ML Performed at [facility]    Culture   Final    Multiple bacterial morphotypes present, none predominant. Suggest appropriate recollection if clinically indicated. Performed at [facility]    Report Status 08/25/2014 FINAL  Final   Studies/Results: Dg Chest 2 View  08/23/2014   CLINICAL DATA:   Chest pain  EXAM: CHEST  2 VIEW  COMPARISON:  06/20/2014  FINDINGS: Prior CABG.  Negative for heart failure.  Heart size mildly enlarged  Negative for pneumonia or effusion. No mass lesion. Apical scarring bilaterally.  IMPRESSION: No active cardiopulmonary disease.   Electronically Signed   By: Marlan Palau M.D.   On: 08/23/2014 16:46   US Renal  08/24/2014   CLINICAL DATA:  Urinary tract infection. Difficulty urinating. History of hypertension and diabetes and chronic kidney disease, stage IV.  EXAM: RENAL/URINARY TRACT ULTRASOUND COMPLETE  COMPARISON:  03/25/2014  FINDINGS: Right Kidney:  Length: 9.3 cm. Kidney is echogenic. Lower pole cyst measuring 19 mm. No other renal masses. No hydronephrosis.  Left Kidney:  Length: 9.1 cm. Kidney is echogenic. Midpole cyst measuring 2.3 cm. No other renal masses. No hydronephrosis.  Bladder:  Appears normal for degree of bladder distention.  IMPRESSION: 1. No hydronephrosis.  No acute finding. 2. Echogenic kidneys consistent with medical renal disease. 3. Bilateral renal cysts, similar to the prior ultrasound.   Electronically Signed   By: Amie Portland M.D.   On: 08/24/2014 09:04   Medications: I have reviewed the patient's current medications. Scheduled Meds: . amLODipine  10 mg Oral Daily  . aspirin EC  81 mg Oral Daily  . carvedilol  25 mg Oral BID WC  . cefTRIAXone (ROCEPHIN)  IV  1 g Intravenous Q24H  . docusate sodium  100 mg Oral BID  . fluticasone  1 puff Inhalation BID  . heparin  5,000 Units Subcutaneous 3 times per day  . hydrALAZINE  50 mg Oral TID  . insulin aspart  0-9 Units Subcutaneous TID WC  . insulin glargine  10 Units Subcutaneous QHS  . isosorbide mononitrate  120 mg Oral Daily  . pantoprazole  40 mg Oral Daily  . rosuvastatin  20 mg Oral Daily  . sodium chloride  3 mL Intravenous Q12H   Continuous Infusions:   PRN Meds:.albuterol, nitroGLYCERIN, senna Assessment/Plan: Principal Problem:   Urinary tract infection in elderly  patient Active Problems:   Hyperlipidemia   Essential hypertension   GERD   Type II diabetes mellitus with stage 4 chronic kidney disease   Normocytic anemia   Chronic combined systolic and diastolic CHF (congestive heart failure)   COPD (chronic obstructive pulmonary disease)   Chest pain   Acute renal failure superimposed on stage 4 chronic kidney disease   Thrombocytopenia   Coronary artery disease involving autologous vein coronary bypass graft with other forms of angina  pectoris   Urinary retention with incomplete bladder emptying   Type 2 diabetes mellitus with stage 5 chronic kidney disease   Urinary tract infection complicated by urinary retention:  Pt feels better without suprapubic tenderness or dysuria.  Afebrile with no leukocytosis. Urinary retention improving with ambulation.  Renal US without hydronephrosis.  Family member states pt suffers from constipation which is more likely contributing to her frequent UTIs and urinary retention.  Urine cx multiple morphs.   -cont ceftriaxone 1 g IV daily, transition to po tomorrow  -cont ambulation  -In and out cath PRN  -senna PRN for constipation  -repeat UA  -consider outpatient follow-up with urology to assess for anatomical etiologies for urinary retention  Chest pain in the setting of history of CAD s/p CABG:   Resolved.  Patient recent stress testing with evidence of nonreversible ischemia and cardiology has been managing her medically. Trops negative 3. No concerning changes on EKG. Low suspicion for PE.  -cancel LE dopplers as PE unlikely given no tachycardia and O2 sat 100% on RA  Acute on chronic kidney disease:  Renal US unremarkable for any hydronephrosis. -cont home ASA 81 mg daily. -cont home Coreg 25 mg twice a day. -cont home rosuvastatin 20 mg daily.  Normocytic anemia:  Hgb stable.  Patient currently asymptomatic.  Likely d/t anemia of chronic disease from CKD.  Ferritin, B12, iron all wnl.  TIBC low so  not suggestive of iron def anemia.    -FOBT pending. -Continue to monitor.  Type 2 diabetes:  Takes Lantus 20 units daily at bedtime along with 8-10 units of mealtime NovoLog.  Recent Labs  08/24/14 1712 08/24/14 2049 08/25/14 0759  GLUCAP 128* 161* 148*   -CBGs before meals at bedtime. -Sliding scale insulin sensitive.  Hypertension:  Though there may be some association between hydralazine and urinary retention, I was unable to find this association upon my review. The incidence may be low. -cont home amlodipine 10 mg daily. -cont hydralazine 50 mg 3 times a day. -cont home Imdur 120 mg daily.  COPD, GOLD stage II:  Pulmonary function tests from 07/09/2014 consistent with COPD, despite patient being a never smoker. FEV1 66% of predicted. -cont home Flovent twice a day.  Chronic constipation: Likely contributing to urinary retention.   -Continue home docusate 100 mg twice a day. -ambulate pt q shift, out of bed to chair  -senokot PRN if no BM since admission  -PT eval   Dispo:  Disposition is deferred at this time, awaiting improvement of current medical problems. Anticipated discharge in approximately 1 day.    The patient does have a current PCP (Carly Arlyce Harman, MD), therefore will be require OPC follow-up after discharge.    LOS: 1 day   Marrian Salvage, MD 08/25/2014, 11:50 AM

## 2014-08-25 NOTE — Progress Notes (Addendum)
OT cancellation Will attempt this pm   08/25/14 1100  OT Visit Information  Last OT Received On 08/25/14  Reason Eval/Treat Not Completed Patient at procedure or test/ unavailable (having doppler)  [facility] Shelia Magallon, OTR/L  [phone] 08/25/2014

## 2014-08-26 DIAGNOSIS — R638 Other symptoms and signs concerning food and fluid intake: Secondary | ICD-10-CM

## 2014-08-26 DIAGNOSIS — R339 Retention of urine, unspecified: Secondary | ICD-10-CM

## 2014-08-26 LAB — GLUCOSE, CAPILLARY
GLUCOSE-CAPILLARY: 280 mg/dL — AB (ref 70–99)
GLUCOSE-CAPILLARY: 42 mg/dL — AB (ref 70–99)
GLUCOSE-CAPILLARY: 273 mg/dL — AB (ref 70–99)
GLUCOSE-CAPILLARY: 107 mg/dL — AB (ref 70–99)
Glucose-Capillary: 110 mg/dL — ABNORMAL HIGH (ref 70–99)
Glucose-Capillary: 125 mg/dL — ABNORMAL HIGH (ref 70–99)

## 2014-08-26 MED ORDER — BISACODYL 10 MG RE SUPP
10.0000 mg | Freq: Once | RECTAL | Status: DC
  Filled 2014-08-26: qty 1

## 2014-08-26 MED ORDER — HYDRALAZINE HCL 25 MG PO TABS
25.0000 mg | ORAL_TABLET | Freq: Three times a day (TID) | ORAL | Status: DC
  Administered 2014-08-26 – 2014-08-27 (×3): 25 mg via ORAL
  Filled 2014-08-26 (×3): qty 1

## 2014-08-26 MED ORDER — INSULIN GLARGINE 100 UNIT/ML ~~LOC~~ SOLN
5.0000 [IU] | Freq: Every day | SUBCUTANEOUS | Status: DC
Start: 2014-08-26 — End: 2014-08-26

## 2014-08-26 MED ORDER — ACETAMINOPHEN 325 MG PO TABS
650.0000 mg | ORAL_TABLET | Freq: Four times a day (QID) | ORAL | Status: DC | PRN
  Administered 2014-08-26 – 2014-08-27 (×3): 650 mg via ORAL
  Filled 2014-08-26 (×3): qty 2

## 2014-08-26 MED ORDER — BETHANECHOL CHLORIDE 5 MG PO TABS: 5.0000 mg | ORAL_TABLET | Freq: Three times a day (TID) | ORAL | Status: DC

## 2014-08-26 MED ORDER — SENNA 8.6 MG PO TABS
2.0000 | ORAL_TABLET | Freq: Every day | ORAL | Status: DC
  Administered 2014-08-27: 17.2 mg via ORAL
  Filled 2014-08-26 (×2): qty 2

## 2014-08-26 MED ORDER — DEXTROSE 50 % IV SOLN
INTRAVENOUS | Status: AC
  Administered 2014-08-26: 50 mL
  Filled 2014-08-26: qty 50

## 2014-08-26 MED ORDER — SENNA 8.6 MG PO TABS
2.0000 | ORAL_TABLET | Freq: Once | ORAL | Status: AC
  Administered 2014-08-26: 17.2 mg via ORAL
  Filled 2014-08-26: qty 2

## 2014-08-26 NOTE — Clinical Documentation Improvement (Signed)
  Please clarify conflicting documentation and intended diagnosis. Both "Acute on chronic kidney disease" and "Acute renal failure" are documented in the chart. In the Coding world "Acute on chronic kidney disease" codes to "disorder of kidney and ureter". Also both "CKD V" and "CKD IV" are documented. Please clarify current stage of CKD.  On admit BUN 42, Cr 3.70, GFR 12/14.   Marland Kitchen. Possible conditions -- AKI on CKD -- Acute renal failure on CKD -- Other condition -- Unable to clinically determine  . Be specific with documentation --Acute renal insufficiency and acute kidney disease are not reported as acute renal failure  Thank Harle BattiestYou,  Kimiyah Blick RN CDI [phone] HIM department

## 2014-08-26 NOTE — Progress Notes (Signed)
Hypoglycemic Event  CBG: 42  Treatment: D50 IV 50 mL  Symptoms: Shaky  Follow-up CBG: Time:0519  CBG Result:280  Possible Reasons for Event: Inadequate meal intake  Comments/MD notified:pt still feels weak, nurse to get juice per patient request    Karn PicklerWalker, Nasra Counce L  Remember to initiate Hypoglycemia Order Set & complete

## 2014-08-26 NOTE — Progress Notes (Signed)
Subjective:  Patient states that she is feeling well today. However, she states that she has not yet had a bowel movement. Patient requiring 1 in and out catheterization this morning. Patient having hypoglycemia overnight due to a missed dinner. Otherwise, patient not reporting any complaints.  Objective: Vital signs in last 24 hours: Filed Vitals:   08/25/14 0912 08/25/14 1340 08/25/14 2100 08/26/14 0500  BP:  118/58 144/60 156/65  Pulse:  78 71 71  Temp:  98.5 F (36.9 C) 98.8 F (37.1 C) 97.7 F (36.5 C)  TempSrc:  Oral    Resp:  Height:      Weight:    100 lb 14.4 oz (45.768 kg)  SpO2: 99% 99% 100% 100%   Weight change: -1 lb 9.6 oz (-0.726 kg)  Intake/Output Summary (Last 24 hours) at 08/26/14 0746 Last data filed at 08/26/14 0520  Gross per 24 hour  Intake    930 ml  Output   4150 ml  Net  -3220 ml    General: Laying in bed in no acute distress Cardiac: RRR, no rubs, murmurs or gallops Pulm: CTAB Abd: soft, nontender, nondistended, BS present, suprapubic fullness, no tenderness Ext: warm and well perfused, no BLE Neuro: alert and oriented X3, cranial nerves II-XII grossly intact Skin: no rashes or lesions noted Psych: appropriate affect  Lab Results: Basic Metabolic Panel:  Recent Labs Lab 08/24/14 0624 08/25/14 0355  NA 140 140  K 4.6 4.8  CL 113* 112  CO2 18* 17*  GLUCOSE 119* 98  BUN 37* 38*  CREATININE 3.08* 3.10*  CALCIUM 8.7 8.6  PHOS  --  4.5   Liver Function Tests:  Recent Labs Lab 08/23/14 2014 08/25/14 0355  AST 24  --   ALT 17  --   ALKPHOS 66  --   BILITOT 0.3  --   PROT 6.4  --   ALBUMIN 3.1* 2.8*   No results for input(s): LIPASE, AMYLASE in the last 168 hours. No results for input(s): AMMONIA in the last 168 hours. CBC:  Recent Labs Lab 08/23/14 1625  08/24/14 0355 08/25/14 0355  WBC 6.1  --  6.6 6.4  NEUTROABS 4.7  --  4.8  --   HGB 8.5*  < > 7.6* 7.8*  HCT 26.3*  < > 22.8* 23.8*  MCV 91.3  --  90.1  91.5  PLT 114*  --  108* 108*  < > = values in this interval not displayed. Cardiac Enzymes:  Recent Labs Lab 08/23/14 2014 08/24/14 0115 08/24/14 0624  TROPONINI <0.03 <0.03 <0.03   BNP: No results for input(s): PROBNP in the last 168 hours. D-Dimer: No results for input(s): DDIMER in the last 168 hours. CBG:  Recent Labs Lab 08/25/14 0759 08/25/14 1159 08/25/14 1715 08/25/14 2103 08/26/14 0459 08/26/14 0517  GLUCAP 148* 130* 155* 114* 42* 280*   Hemoglobin A1C: No results for input(s): HGBA1C in the last 168 hours. Fasting Lipid Panel: No results for input(s): CHOL, HDL, LDLCALC, TRIG, CHOLHDL, LDLDIRECT in the last 168 hours. Thyroid Function Tests: No results for input(s): TSH, T4TOTAL, FREET4, T3FREE, THYROIDAB in the last 168 hours. Coagulation: No results for input(s): LABPROT, INR in the last 168 hours. Anemia Panel:  Recent Labs Lab 08/24/14 0115  VITAMINB12 690  FOLATE 5.5  FERRITIN 158  TIBC 200*  IRON 45  RETICCTPCT 2.0   Urine Drug Screen: Drugs of Abuse  No results found for: LABOPIA, COCAINSCRNUR, LABBENZ, AMPHETMU, THCU, LABBARB  Alcohol Level: No results for input(s): ETH in the last 168 hours. Urinalysis:  Recent Labs Lab 08/23/14 1945 08/25/14 1519  COLORURINE YELLOW YELLOW  LABSPEC 1.016 1.011  PHURINE 5.5 5.5  GLUCOSEU >1000* NEGATIVE  HGBUR NEGATIVE NEGATIVE  BILIRUBINUR NEGATIVE NEGATIVE  KETONESUR NEGATIVE NEGATIVE  PROTEINUR >300* >300*  UROBILINOGEN 0.2 0.2  NITRITE POSITIVE* POSITIVE*  LEUKOCYTESUR NEGATIVE MODERATE*   Micro Results: Recent Results (from the past 240 hour(s))  Urine culture     Status: None   Collection Time: 08/23/14  7:45 PM  Result Value Ref Range Status   Specimen Description URINE, CATHETERIZED  Final   Special Requests NONE  Final   Colony Count   Final    >=100,000 COLONIES/ML Performed at [facility]    Culture   Final    Multiple bacterial morphotypes present, none  predominant. Suggest appropriate recollection if clinically indicated. Performed at [facility]    Report Status 08/25/2014 FINAL  Final   Studies/Results: [place]  08/24/2014   CLINICAL DATA:  Urinary tract infection. Difficulty urinating. History of hypertension and diabetes and chronic kidney disease, stage IV.  EXAM: RENAL/URINARY TRACT ULTRASOUND COMPLETE  COMPARISON:  03/25/2014  FINDINGS: Right Kidney:  Length: 9.3 cm. Kidney is echogenic. Lower pole cyst measuring 19 mm. No other renal masses. No hydronephrosis.  Left Kidney:  Length: 9.1 cm. Kidney is echogenic. Midpole cyst measuring 2.3 cm. No other renal masses. No hydronephrosis.  Bladder:  Appears normal for degree of bladder distention.  IMPRESSION: 1. No hydronephrosis.  No acute finding. 2. Echogenic kidneys consistent with medical renal disease. 3. Bilateral renal cysts, similar to the prior ultrasound.   Electronically Signed   By: Amie Portlandavid  Ormond M.D.   On: 08/24/2014 09:04   Medications: I have reviewed the patient's current medications. Scheduled Meds: . amLODipine  10 mg Oral Daily  . aspirin EC  81 mg Oral Daily  . carvedilol  25 mg Oral BID WC  . cefTRIAXone (ROCEPHIN)  IV  1 g Intravenous Q24H  . docusate sodium  100 mg Oral BID  . fluticasone  1 puff Inhalation BID  . heparin  5,000 Units Subcutaneous 3 times per day  . hydrALAZINE  50 mg Oral TID  . insulin aspart  0-9 Units Subcutaneous TID WC  . isosorbide mononitrate  120 mg Oral Daily  . pantoprazole  40 mg Oral Daily  . rosuvastatin  20 mg Oral Daily  . sodium chloride  3 mL Intravenous Q12H   Continuous Infusions:   PRN Meds:.albuterol, nitroGLYCERIN Assessment/Plan: Principal Problem:   Urinary tract infection in elderly patient Active Problems:   Essential hypertension   GERD   Type II diabetes mellitus with stage 4 chronic kidney disease   Chronic combined systolic and diastolic CHF (congestive heart failure)   COPD (chronic obstructive  pulmonary disease)   Hyperlipidemia   Normocytic anemia   Chest pain   Acute renal failure superimposed on stage 4 chronic kidney disease   Thrombocytopenia   Coronary artery disease involving autologous vein coronary bypass graft with other forms of angina pectoris   Urinary retention with incomplete bladder emptying   Type 2 diabetes mellitus with stage 5 chronic kidney disease   Urinary tract infection complicated by urinary retention: Patient not reporting any suprapubic tenderness or dysuria although still having some retention overnight. Patient is still able to void although she is still having a postvoid residual requiring in and out catheterization. Patient has remained afebrile with  no leukocytosis. Renal ultrasound without evidence of hydronephrosis. This could be secondary to urinary tract infection or it could also be secondary to concurrent constipation as patient has not had a bowel movement since her initial hospitalization. -Recent urine culture given multiple morphotypes on the one initially sent -Continue on ceftriaxone 1 g IV daily with transition to orals (consider cephalexin, history of resistance to fluoroquinolones on previous urine cultures, renal insufficiency) if urinary retention results today (antibiotic start date on 08/23/2014). In the setting of Complicated UTI, will plan on 10 day course of antibiotics total. -cont ambulation  -In and out cath PRN  -senna PRN for constipation  -repeat urine cultures -consider outpatient follow-up with urology to assess for anatomical etiologies for urinary retention  Chest pain in the setting of history of CAD s/p CABG:  Resolved.  Patient recent stress testing with evidence of nonreversible ischemia and cardiology has been managing her medically. Trops negative 3. No concerning changes on EKG. Low suspicion for PE. Lower extremity Doppler negative for evidence of DVT.  Acute on chronic kidney disease: Renal US unremarkable for  any hydronephrosis. Creatinine stabilized at baseline of 3.1.  Normocytic anemia: Hemoglobin stabilized at around 7.8. Patient currently asymptomatic.  Likely d/t anemia of chronic disease from CKD.  -FOBT pending. -Continue to monitor.  Type 2 diabetes: Hypoglycemia overnight secondary to reduced PO intake. At home, takes Lantus 20 units daily at bedtime along with 8-10 units of mealtime NovoLog. -CBGs before meals at bedtime. -Sliding scale insulin sensitive. -Discontinue lantus 10 units right now given reduced PO intake.   Hypertension: Better controlled at 118-156/58-65.  -cont home amlodipine 10 mg daily. -cont hydralazine 50 mg 3 times a day. -cont home Imdur 120 mg daily.  COPD, GOLD stage II:  Pulmonary function tests from 07/09/2014 consistent with COPD, despite patient being a never smoker. FEV1 66% of predicted. -cont home Flovent twice a day.  Chronic constipation: Likely contributing to urinary retention.   -Continue home docusate 100 mg twice a day. -ambulate pt q shift, out of bed to chair  -senokot repeat again today -Dulcolax suppository -PT eval   Dispo:  Disposition is deferred at this time, awaiting improvement of current medical problems. Anticipated discharge in approximately 0-1 day.    The patient does have a current PCP (Carly Arlyce Harman, MD), therefore will be require OPC follow-up after discharge.    LOS: 2 days   Harold Barban, MD 08/26/2014, 7:46 AM

## 2014-08-26 NOTE — Progress Notes (Signed)
  PROGRESS NOTE MEDICINE TEACHING ATTENDING   Day 2 of stay Patient name: Bianca [name]   Medical record number: [id number] Date of birth: 03-15-1931   Met with patient. A relative of hers translated for me. The patient reports feeling better. She has less  "tightness" in her abdomen she reported. She reports new back pain, is using hot packs an feels relieved.   Blood pressure 123/65, pulse 79, temperature 98.5 F (36.9 C), temperature source Oral, resp. rate 13, height 5' (1.524 m), weight 100 lb 14.4 oz (45.768 kg), SpO2 100 %. The patient is alert and oriented, comfortable, in no acute distress. PERRL, EOMI. Heart exhibits regular rate and rhythm, no murmurs. Lungs are clear to auscultation. Abdomen is soft and non-tender. There is no pedal edema and good pedal pulses. There are no gross focal neurological deficits apparent.   Assessment/Plan - Please see Dr Trudee Kuster note for details.  Bladder evacuation has been getting better. Today the patient voided on her own, however did retain some urine. We continue to monitor for retention.  By tomorrow, we should be able to have culture sensitivities back and we should be able to start oral antibiotics. The patient will be referred to urology for possible chronic retention, which might be the reason for recurrent infections. Back pain seems positional, relieved by hot packs.   I have discussed the care of this patient with my IM team residents. Please see the resident note for details.  [place], [place] 08/26/2014, 5:02 PM.

## 2014-08-26 NOTE — Consult Note (Signed)
   [facility]   08/26/2014  Donavan BurnetJieh T Romulus 1930/08/06 [id number] Referral received.  Patient [facility] continued [facility] services.  Patient is not currently eligible for [facility] services because of not having a [facility] delegation with the Medicare plan.  For any additional questions or new referrals please contact: Charlesetta ShanksVictoria Nixie Laube, RN BSN CCM [facility] Liaison  [phone] business mobile phone

## 2014-08-26 NOTE — Progress Notes (Signed)
Pt in-out cath with output by this RN. Pt urine clear at beginning of in-out cath then urine turns cloudy/milky.

## 2014-08-26 NOTE — Care Management Note (Unsigned)
    Page 1 of 1   08/26/2014     12:01:56 PM CARE MANAGEMENT NOTE 08/26/2014  Patient:  Bianca BurnetCHANG,Bianca [name]   Account Number:  [id number]  Date Initiated:  08/26/2014  Documentation initiated by:  Raynald BlendLAXTON,SAMANTHA  Subjective/Objective Assessment:   Pt admitted to rule out MI.  Pt is positive for UTI     Action/Plan:   CM will continue to monitor for dispostion plan   Anticipated DC Date:  08/28/2014   Anticipated DC Plan:  HOME/SELF CARE         Choice offered to / List presented to:             Status of service:   Medicare Important Message given?  YES (If response is "NO", the following Medicare IM given date fields will be blank) Date Medicare IM given:  08/26/2014 Medicare IM given by:  Raynald BlendLAXTON,SAMANTHA Date Additional Medicare IM given:   Additional Medicare IM given by:    Discharge Disposition:    Per UR Regulation:    If discussed at Long Length of Stay Meetings, dates discussed:    Comments:

## 2014-08-26 NOTE — Progress Notes (Signed)
Pt ambulated up and down in the hallway with no difficulties, she used standard walker. Physicotherapy was paged and they will assess pt as soon as possible.

## 2014-08-26 NOTE — Progress Notes (Signed)
PT Note  New PT order received. PT was evaluated and dc'd by PT on 08/24/14. Please refer to that note. Per nursing not today pt still at that level. No new PT eval needed.  Fluor CorporationCary Omarr Hann PT [phone]

## 2014-08-26 NOTE — Progress Notes (Addendum)
    Subjective:   Day of hospitalization: 2  VSS.  No overnight events.  Denies any pain this AM.  States she has been up walking this AM.  PVR recorded.    Assessment/Plan:   Urinary retention with incomplete bladder emptying Spoke with Dr. Annabell HowellsWrenn of urology and states that having a PVR of 267cc after voiding 100cc is not an unreasonable PVR.  He recommends trying timed double voiding and/or a trial of bethanechol tid vs. flomax.  Urinary retention likely due to hypotonic bladder and treatment options are limited.   LOS: 2 days   Marrian SalvageJacquelyn S Zareya Tuckett, MD PGY-2, Internal Medicine Teaching Service 08/26/2014, 3:27 PM

## 2014-08-26 NOTE — Progress Notes (Signed)
Bladder scan revealed 309 ml of urine. Pt was  In and out bladder catheterized  which yielded 300 cc off clear yellowish urine.  Also pt refused to take dulcolax suppository and sated she had a bowel movement an hour ago.

## 2014-08-26 NOTE — Progress Notes (Signed)
Pt just voided 100 cc of clear yellowish urine and immediatly after, the bladder scan measured  267 cc of urine.  Will continue to monitor.

## 2014-08-27 LAB — GLUCOSE, CAPILLARY
GLUCOSE-CAPILLARY: 137 mg/dL — AB (ref 70–99)
Glucose-Capillary: 107 mg/dL — ABNORMAL HIGH (ref 70–99)

## 2014-08-27 MED ORDER — CEPHALEXIN 500 MG PO CAPS: 500.0000 mg | ORAL_CAPSULE | Freq: Four times a day (QID) | ORAL | Status: DC

## 2014-08-27 MED ORDER — INSULIN GLARGINE 100 UNIT/ML ~~LOC~~ SOLN: 5.0000 [IU] | Freq: Every day | SUBCUTANEOUS | Status: DC

## 2014-08-27 NOTE — Progress Notes (Signed)
Subjective:  Patient states that she continues to feel well today. She is denying any urinary complaints. She is denying any other complaints this morning.  Objective: Vital signs in last 24 hours: Filed Vitals:   08/26/14 1455 08/26/14 2012 08/26/14 2155 08/27/14 0500  BP: 123/65 107/52  128/57  Pulse:  71  70  Temp:  98.3 F (36.8 C)  98.2 F (36.8 C)  TempSrc:  Oral  Oral  Resp:  16  14  Height:      Weight:    103 lb 4.8 oz (46.857 kg)  SpO2:  100% 99% 100%   Weight change: 2 lb 6.4 oz (1.089 kg)  Intake/Output Summary (Last 24 hours) at 08/27/14 0801 Last data filed at 08/27/14 0553  Gross per 24 hour  Intake    240 ml  Output   1450 ml  Net  -1210 ml    General: Laying in bed in no acute distress Cardiac: RRR, no rubs, murmurs or gallops Pulm: CTAB Abd: soft, nontender, nondistended, BS present, suprapubic fullness, no tenderness Ext: warm and well perfused, no BLE Neuro: alert and oriented X3, cranial nerves II-XII grossly intact Skin: no rashes or lesions noted Psych: appropriate affect  Lab Results: Basic Metabolic Panel:  Recent Labs Lab 08/24/14 0624 08/25/14 0355  NA 140 140  K 4.6 4.8  CL 113* 112  CO2 18* 17*  GLUCOSE 119* 98  BUN 37* 38*  CREATININE 3.08* 3.10*  CALCIUM 8.7 8.6  PHOS  --  4.5   Liver Function Tests:  Recent Labs Lab 08/23/14 2014 08/25/14 0355  AST 24  --   ALT 17  --   ALKPHOS 66  --   BILITOT 0.3  --   PROT 6.4  --   ALBUMIN 3.1* 2.8*   No results for input(s): LIPASE, AMYLASE in the last 168 hours. No results for input(s): AMMONIA in the last 168 hours. CBC:  Recent Labs Lab 08/23/14 1625  08/24/14 0355 08/25/14 0355  WBC 6.1  --  6.6 6.4  NEUTROABS 4.7  --  4.8  --   HGB 8.5*  < > 7.6* 7.8*  HCT 26.3*  < > 22.8* 23.8*  MCV 91.3  --  90.1 91.5  PLT 114*  --  108* 108*  < > = values in this interval not displayed. Cardiac Enzymes:  Recent Labs Lab 08/23/14 2014 08/24/14 0115 08/24/14 0624    TROPONINI <0.03 <0.03 <0.03   BNP: No results for input(s): PROBNP in the last 168 hours. D-Dimer: No results for input(s): DDIMER in the last 168 hours. CBG:  Recent Labs Lab 08/26/14 0517 08/26/14 0747 08/26/14 1133 08/26/14 1643 08/26/14 2048 08/27/14 0727  GLUCAP 280* 110* 125* 273* 107* 107*   Hemoglobin A1C: No results for input(s): HGBA1C in the last 168 hours. Fasting Lipid Panel: No results for input(s): CHOL, HDL, LDLCALC, TRIG, CHOLHDL, LDLDIRECT in the last 168 hours. Thyroid Function Tests: No results for input(s): TSH, T4TOTAL, FREET4, T3FREE, THYROIDAB in the last 168 hours. Coagulation: No results for input(s): LABPROT, INR in the last 168 hours. Anemia Panel:  Recent Labs Lab 08/24/14 0115  VITAMINB12 690  FOLATE 5.5  FERRITIN 158  TIBC 200*  IRON 45  RETICCTPCT 2.0   Urine Drug Screen: Drugs of Abuse  No results found for: LABOPIA, COCAINSCRNUR, LABBENZ, AMPHETMU, THCU, LABBARB  Alcohol Level: No results for input(s): ETH in the last 168 hours. Urinalysis:  Recent Labs Lab 08/23/14 1945 08/25/14 1519  COLORURINE YELLOW YELLOW  LABSPEC 1.016 1.011  PHURINE 5.5 5.5  GLUCOSEU >1000* NEGATIVE  HGBUR NEGATIVE NEGATIVE  BILIRUBINUR NEGATIVE NEGATIVE  KETONESUR NEGATIVE NEGATIVE  PROTEINUR >300* >300*  UROBILINOGEN 0.2 0.2  NITRITE POSITIVE* POSITIVE*  LEUKOCYTESUR NEGATIVE MODERATE*   Micro Results: Recent Results (from the past 240 hour(s))  Urine culture     Status: None   Collection Time: 08/23/14  7:45 PM  Result Value Ref Range Status   Specimen Description URINE, CATHETERIZED  Final   Special Requests NONE  Final   Colony Count   Final    >=100,000 COLONIES/ML Performed at [facility]    Culture   Final    Multiple bacterial morphotypes present, none predominant. Suggest appropriate recollection if clinically indicated. Performed at [facility]    Report Status 08/25/2014 FINAL  Final  Culture, Urine      Status: None (Preliminary result)   Collection Time: 08/25/14  3:19 PM  Result Value Ref Range Status   Specimen Description URINE, CLEAN CATCH  Final   Special Requests NONE  Final   Colony Count   Final    >=100,000 COLONIES/ML Performed at [facility]    Culture   Final    ESCHERICHIA COLI Performed at [facility]    Report Status PENDING  Incomplete   Studies/Results: No results found. Medications: I have reviewed the patient's current medications. Scheduled Meds: . amLODipine  10 mg Oral Daily  . aspirin EC  81 mg Oral Daily  . carvedilol  25 mg Oral BID WC  . cefTRIAXone (ROCEPHIN)  IV  1 g Intravenous Q24H  . docusate sodium  100 mg Oral BID  . fluticasone  1 puff Inhalation BID  . heparin  5,000 Units Subcutaneous 3 times per day  . hydrALAZINE  25 mg Oral 3 times per day  . insulin aspart  0-9 Units Subcutaneous TID WC  . isosorbide mononitrate  120 mg Oral Daily  . pantoprazole  40 mg Oral Daily  . rosuvastatin  20 mg Oral Daily  . senna  2 tablet Oral Daily  . sodium chloride  3 mL Intravenous Q12H   Continuous Infusions:   PRN Meds:.acetaminophen, albuterol, nitroGLYCERIN Assessment/Plan: Principal Problem:   Urinary tract infection in elderly patient Active Problems:   Essential hypertension   GERD   Type II diabetes mellitus with stage 4 chronic kidney disease   Chronic combined systolic and diastolic CHF (congestive heart failure)   COPD (chronic obstructive pulmonary disease)   Hyperlipidemia   Normocytic anemia   Chest pain   Acute renal failure superimposed on stage 4 chronic kidney disease   Thrombocytopenia   Coronary artery disease involving autologous vein coronary bypass graft with other forms of angina pectoris   Urinary retention with incomplete bladder emptying   Type 2 diabetes mellitus with stage 5 chronic kidney disease  Urinary tract infection complicated by urinary retention: Urinary retention is improved with no  requirement of catheterization overnight. 850 mL of urine found at bedside commode this morning. Patient does likely have increased post void residual due to hypotonic bladder, likely secondary to long-standing diabetes. However, her level of retention at this point does not require indwelling catheterization. Renal ultrasound did not show any complicating evidence of hydronephrosis. Patient's improvements likely secondary to improvement of her urinary tract infection. Additionally, patient has had bowel movements as well which may also be contributory. -Urine culture growing Escherichia coli though sensitivities are pending. -Transition to orals  from ceftriaxone pending antibiotic sensitivities (antibiotic start date on 08/23/2014). In the setting of Complicated UTI, will plan on 10 day course of antibiotics total (end date 09/02/2014). -cont ambulation  -In and out cath PRN  -senna PRN for constipation  -consider outpatient follow-up with urology to assess for anatomical etiologies for urinary retention -Patient likely stable for discharge today.  Chest pain in the setting of history of CAD s/p CABG:  Resolved.  Patient recent stress testing with evidence of nonreversible ischemia and cardiology has been managing her medically. Trops negative 3. No concerning changes on EKG. Low suspicion for PE. Lower extremity Doppler negative for evidence of DVT.  Acute on chronic kidney disease: Renal US unremarkable for any hydronephrosis. Creatinine stabilized at baseline of 3.1.  Normocytic anemia: Hemoglobin stabilized at around 7.8. Patient currently asymptomatic.  Likely d/t anemia of chronic disease from CKD.   Type 2 diabetes: Patient with no episodes of hypoglycemia overnight, with blood glucoses ranging from 107-273. At home, takes Lantus 20 units daily at bedtime along with 8-10 units of mealtime NovoLog. -CBGs before meals at bedtime. -Sliding scale insulin sensitive. -Consider discharging  patient on 5 units Lantus.  Hypertension: Moderately well controlled at 107-152/52-69.  -cont home amlodipine 10 mg daily. -cont hydralazine 50 mg 3 times a day. -cont home Imdur 120 mg daily.  COPD, GOLD stage II: Pulmonary function tests from 07/09/2014 consistent with COPD, despite patient being a never smoker. FEV1 66% of predicted. -cont home Flovent twice a day.  Chronic constipation: Improved and was likely a contributing factor to urinary retention. -Continue home docusate 100 mg twice a day. -ambulate pt q shift, out of bed to chair  -Dulcolax suppository as needed  Dispo:  Disposition is deferred at this time, awaiting improvement of current medical problems. Anticipated discharge in approximately 0-1 day.    The patient does have a current PCP (Carly Arlyce Harman, MD), therefore will be require OPC follow-up after discharge.    LOS: 3 days   Harold Barban, MD 08/27/2014, 8:01 AM

## 2014-08-27 NOTE — Progress Notes (Signed)
UR Completed Erryn Dickison Graves-Bigelow, RN,BSN [phone]  

## 2014-08-27 NOTE — Progress Notes (Signed)
  PROGRESS NOTE MEDICINE TEACHING ATTENDING   Day 3 of stay Patient name: Bianca [name]   Medical record number: [id number] Date of birth: [birth year]   Met and examined patient. She appears to be clinically improved. She voided without difficulty.  Blood pressure 125/70, pulse 70, temperature 98.2 F (36.8 C), temperature source Oral, resp. rate 14, height 5' (1.524 m), weight 103 lb 4.8 oz (46.857 kg), SpO2 100 %. The patient is alert and oriented, comfortable, in no acute distress. PERRL, EOMI. Heart exhibits regular rate and rhythm, no murmurs. Lungs are clear to auscultation. Abdomen is soft and non-tender. There is no pedal edema and good pedal pulses. There are no gross focal neurological deficits apparent.   Assessment/Plan We will discharge the patient on Keflex. We will await culture sensitivities and inform the patient if there are any changes required in antibiotic regimen. Urology f/u outpatient I have discussed the care of this patient with my IM team residents. Please see the resident Dr Trudee Kuster note for details.  [place], [place] 08/27/2014, 11:55 AM.

## 2014-08-27 NOTE — Progress Notes (Signed)
Employee got exposed to pt blood through needle stick and blood work were conducted on pt per protocol. The result revealed to be positive for hepatitis B surface AG and negative for HIV and Hep C markers. MD was notified. Pt ok to be discharged today and to follow up as an oupatient.

## 2014-08-27 NOTE — Discharge Summary (Signed)
Name: Bianca [name] MRN: [id number] DOB: Jun 10, 1930 81 y.o. PCP: Su Hoff, MD  Date of Admission: 08/23/2014  5:40 PM Date of Discharge: 08/27/2014 Attending Physician: Dr. Aletta Edouard  Discharge Diagnosis: Principal Problem:   Urinary tract infection in elderly patient Active Problems:   Essential hypertension   GERD   Type II diabetes mellitus with stage 4 chronic kidney disease   Chronic combined systolic and diastolic CHF (congestive heart failure)   COPD (chronic obstructive pulmonary disease)   Hyperlipidemia   Normocytic anemia   Chest pain   Acute renal failure superimposed on stage 4 chronic kidney disease   Thrombocytopenia   Coronary artery disease involving autologous vein coronary bypass graft with other forms of angina pectoris   Urinary retention with incomplete bladder emptying   Type 2 diabetes mellitus with stage 5 chronic kidney disease  Discharge Medications:   Medication List    STOP taking these medications        calcitRIOL 0.25 MCG capsule  Commonly known as:  ROCALTROL      TAKE these medications        acetaminophen 500 MG tablet  Commonly known as:  TYLENOL  Take 2 tablets (1,000 mg total) by mouth every 8 (eight) hours as needed for mild pain.     amLODipine 10 MG tablet  Commonly known as:  NORVASC  Take 1 tablet (10 mg total) by mouth daily.     aspirin EC 81 MG tablet  TAKE 1 TABLET BY MOUTH EVERY DAY     B-D ULTRAFINE III SHORT PEN 31G X 8 MM Misc  Generic drug:  Insulin Pen Needle  USE AS DIRECTED TO INJECT INSULIN 3 TIMES A DAY     BAYER MICROLET LANCETS lancets  Use as instructed     calcium citrate-vitamin D 315-200 MG-UNIT per tablet  Commonly known as:  CITRACAL+D  Take 2 tablets by mouth 2 (two) times daily.     carvedilol 25 MG tablet  Commonly known as:  COREG  TAKE 1 TABLET (25 MG TOTAL) BY MOUTH 2 (TWO) TIMES DAILY WITH A MEAL.     CRESTOR 20 MG tablet  Generic drug:  rosuvastatin  TAKE 1 TABLET (20  MG TOTAL) BY MOUTH DAILY.     diclofenac sodium 1 % Gel  Commonly known as:  VOLTAREN  Apply 2 g topically 4 (four) times daily.     docusate sodium 100 MG capsule  Commonly known as:  COLACE  Take 100 mg by mouth 2 (two) times daily.     fluticasone 44 MCG/ACT inhaler  Commonly known as:  FLOVENT HFA  Inhale 1 puff into the lungs 2 (two) times daily.     glucose blood test strip  Commonly known as:  BAYER CONTOUR NEXT TEST  1 each by Other route as needed for other. Use 4 to 5 times daily to check blood sugar. diag code E11.9. Insulin dependent     hydrALAZINE 25 MG tablet  Commonly known as:  APRESOLINE  Take 1 tablet (25 mg total) by mouth 3 (three) times daily.     insulin aspart 100 UNIT/ML FlexPen  Commonly known as:  NOVOLOG FLEXPEN  Please take 8 units with breakfast, 8 units with lunch, and 10 units with dinner/supper     insulin glargine 100 UNIT/ML injection  Commonly known as:  LANTUS  Inject 0.05 mLs (5 Units total) into the skin at bedtime.     isosorbide mononitrate 120 MG 24  hr tablet  Commonly known as:  IMDUR  Take 1 tablet (120 mg total) by mouth daily.     nitroGLYCERIN 0.4 MG SL tablet  Commonly known as:  NITROSTAT  Place 0.4 mg under the tongue every 5 (five) minutes as needed. For chest pain. Maximum of 3 doses     omeprazole 20 MG capsule  Commonly known as:  PRILOSEC  TAKE 1 CAPSULE (20 MG TOTAL) BY MOUTH DAILY.     PROAIR HFA 108 (90 BASE) MCG/ACT inhaler  Generic drug:  albuterol  INHALE 1 PUFF EVERY 6 HOURS AS NEEDED FOR WHEEZE     sennosides-docusate sodium 8.6-50 MG tablet  Commonly known as:  SENOKOT-S  Take 1 tablet by mouth daily.        Disposition and follow-up:   [name].  At the hospital follow up visit please address:  Urinary tract infection complicated by urinary retention: Patient's final urine culture came back ESBL, resistant to ceftriaxone.  However, patient is no longer symptomatic, so patient was called and told to stop cephalexin (which patient was initially sent home with). Would reassess recurrence of symptoms and may consider Bactrim therapy although patient is right at borderline of creatinine clearance of 15.  Hepatitis B: Would perform further workup regarding positive hepatitis B surface antigen.  Chronic constipation: Would recheck the effectiveness of patient's current regimen for controlling constipation.  Acute on chronic kidney disease: Would recheck creatinine for stabilization.  Normocytic anemia: Would recheck hemoglobin for stabilization.  Type 2 diabetes:  Would recheck efficacy of patient's new regimen and consider up titration to previous prehospital dosages as appropriate.  2.  Labs / imaging needed at time of follow-up: basic metabolic panel, complete blood count  3.  Pending labs/ test needing follow-up: none  Follow-up Appointments: Follow-up Information    Follow up with MCDIARMID,TODD D, MD.   Specialty:  Family Medicine   Why:  Sep 24, 2014 at 1:45 pm   Contact information:   [address] [place] [address] [phone]       Follow up with Christen BameSadek, Nora, MD.   Specialty:  Internal Medicine   Why:  Sep 03, 2014 at 2:45 pm   Contact information:   [address] [place] [address] [phone]       Discharge Instructions: Discharge Instructions    Call MD for:  difficulty breathing, headache or visual disturbances    Complete by:  As directed      Call MD for:  extreme fatigue    Complete by:  As directed      Call MD for:  hives    Complete by:  As directed      Call MD for:  persistant dizziness or light-headedness    Complete by:  As directed      Call MD for:  persistant nausea and vomiting    Complete by:  As directed      Call MD for:  redness, tenderness, or signs of infection (pain, swelling, redness, odor or green/yellow discharge around incision site)     Complete by:  As directed      Call MD for:  severe uncontrolled pain    Complete by:  As directed      Call MD for:  temperature >100.4    Complete by:  As directed      Diet - low sodium heart healthy    Complete  by:  As directed      Increase activity slowly    Complete by:  As directed            Consultations:    Procedures Performed:  Dg Chest 2 View  08/23/2014   CLINICAL DATA:  Chest pain  EXAM: CHEST  2 VIEW  COMPARISON:  06/20/2014  FINDINGS: Prior CABG.  Negative for heart failure.  Heart size mildly enlarged  Negative for pneumonia or effusion. No mass lesion. Apical scarring bilaterally.  IMPRESSION: No active cardiopulmonary disease.   Electronically Signed   By: Marlan Palau M.D.   On: 08/23/2014 16:46   US Renal  08/24/2014   CLINICAL DATA:  Urinary tract infection. Difficulty urinating. History of hypertension and diabetes and chronic kidney disease, stage IV.  EXAM: RENAL/URINARY TRACT ULTRASOUND COMPLETE  COMPARISON:  03/25/2014  FINDINGS: Right Kidney:  Length: 9.3 cm. Kidney is echogenic. Lower pole cyst measuring 19 mm. No other renal masses. No hydronephrosis.  Left Kidney:  Length: 9.1 cm. Kidney is echogenic. Midpole cyst measuring 2.3 cm. No other renal masses. No hydronephrosis.  Bladder:  Appears normal for degree of bladder distention.  IMPRESSION: 1. No hydronephrosis.  No acute finding. 2. Echogenic kidneys consistent with medical renal disease. 3. Bilateral renal cysts, similar to the prior ultrasound.   Electronically Signed   By: Amie Portland M.D.   On: 08/24/2014 09:04    2D Echo: none  Cardiac Cath: none  Admission HPI:   MIKIALA FUGETT is a 81 year old [place] woman with history of hypertension, hyperlipidemia, coronary artery disease status post CABG, type 2 diabetes, COPD, and GERD presenting with fatigue. She reports feeling generalized weakness for the last couple of days that has worsened today. She reports that her blood sugars have been  running higher than normal, measuring in the 300s. She reports some difficulty urinating and suprapubic discomfort, but denies dysuria. She says that she feels like she needs to urinate, but she has difficulty voiding. She also reports developing chest pain 3 hours prior to arrival in the emergency room. She describes the pain as pressure centered in her left upper chest and lasted a couple of hours prior to resolving after coming to the emergency room. She also reports some intermittent shortness of breath associated with her chest pain. She reports increased swelling of her right leg in addition to bilateral leg soreness and chronic back and neck pain. She denies radiation of her chest pain, nausea, vomiting, or diarrhea. She reports some chronic constipation that is unchanged. She last had a catheterization in August 2012 following her CABG that showed 80% stenosis of the obtuse marginal, which has been managed medically. She has been admitted twice over the last few months for chest pain, which she says was similar to her current chest pain. She had a Myoview performed on 05/01/2014 that was read as a low to intermediate risk study with a moderate sized infarct in the mid inferior wall with no areas of reversible ischemia. Cardiology recommended medical management at that time. Echocardiogram at that time showed worsening of her ejection fraction to 40-45%. She followed-up with her cardiologist Dr. Antoine Poche on 07/03/2014, and no changes were made to her medications.  In the ER, initial workup including troponin and EKG was negative for acute ischemia. She was given 500 mL of normal saline and 5 units of NovoLog.  Hospital Course by problem list: Principal Problem:   Urinary tract infection in elderly patient Active Problems:  Essential hypertension   GERD   Type II diabetes mellitus with stage 4 chronic kidney disease   Chronic combined systolic and diastolic CHF (congestive heart failure)    COPD (chronic obstructive pulmonary disease)   Hyperlipidemia   Normocytic anemia   Chest pain   Acute renal failure superimposed on stage 4 chronic kidney disease   Thrombocytopenia   Coronary artery disease involving autologous vein coronary bypass graft with other forms of angina pectoris   Urinary retention with incomplete bladder emptying   Type 2 diabetes mellitus with stage 5 chronic kidney disease   Urinary tract infection complicated by urinary retention: Patient initially presenting with symptoms of urinary retention in addition to suprapubic tenderness. Urinalysis was remarkable for urinary tract infection. Patient was started on IV ceftriaxone although initial urine culture growing contaminants. Patient was continued on ceftriaxone IV until the day of discharge. Repeat urine cultures resulted E coli with ESBL (see below). Patient's symptoms resolved by the day of discharge with improvement in ability to pass urine. Patient does have some tendency to have post void residual greater than 200-300 mL though it was noted that patient was able to void greater than 50% of her bladder which does not require placement of a indwelling catheter per urology. Additionally, renal ultrasound did not show any evidence of hydronephrosis. Patient has had previous hospitalizations for urinary tract infections complicated by urinary retention. Patient is scheduled for outpatient follow-up with urology to further assess risk factors for recurrent urinary tract infections and urinary retention. Of note, there was some discussion from patient's previous hospitalizations regarding some concern for a entero-vesicular fistula but had not been worked up by outpatient urology because of a weather-related cancellation of her appointment. Patient's final urine culture came back ESBL, resistant to ceftriaxone. However, patient is no longer symptomatic, so patient was called and told to stop cephalexin (which patient was  initially sent home with). Would reassess recurrence of symptoms and may consider Bactrim therapy although patient is right at borderline of creatinine clearance of 15.  Hepatitis B: During patient's hospitalization, there was a accidental exposure of a Research scientist (physical sciences) and further workup by occupational therapy revealed that she had a positive hepatitis B surface antigen. This does not seem to be connected into the Epic system upon my review. Discussion with nursing also revealed that patient was HIV negative and hepatitis C negative. Would perform further workup regarding positive hepatitis B surface antigen.  Chest pain in the setting of history of CAD s/p CABG: Patient initially having chest pain partially prompted her presentation to the hospital although this chest pain resolved upon initial evaluation in the emergency department. Patient has had recent stress testing with evidence of ischemia though nonreversible. Cardiology has been managing her symptoms medically as she is unlikely to have good options for revascularization. EKG was unremarkable for acute ischemia and troponins were negative 3. A lower extremity Doppler was negative for evidence of DVT. There was no recurrence of chest pain during her hospitalization.  Chronic constipation: Patient initially presenting to the hospital after several days of no bowel movements. There is some possibility that this constipation may have exacerbated her urinary retention. Patient was able to have bowel movements after administration of senna and docusate. Would recheck the effectiveness of patient's current regimen for controlling constipation.  Acute on chronic kidney disease: Renal US unremarkable for any hydronephrosis. Creatinine stabilized at baseline of 3.1 by the day of discharge. Would recheck creatinine for stabilization.  Normocytic anemia: Hemoglobin stabilized  at around 7.8. Patient currently asymptomatic. Likely secondary to anemia of  chronic disease from CKD. Would recheck hemoglobin for stabilization.  Type 2 diabetes: Prior to this hospitalization, patient was on Lantus 20 units daily along with 8-10 units at mealtime NovoLog. Throughout patient's hospitalization, patient exhibited several episodes of hypoglycemia on a reduced dosage of her long-acting insulin. Therefore, patient was discharged on a reduced regimen of 5 units Lantus along with her mealtime NovoLog. Would recheck efficacy of patient's new regimen and consider up titration to previous prehospital dosages as appropriate.  Hypertension: Patient's blood pressure was well-controlled on her home regimen of amlodipine 10 mg daily and home Imdur 120 mg daily. Her home hydralazine was titrated to 50 mg 3 times a day. Patient was discharged on her home regimen before hospitalization.  COPD, GOLD stage II: Pulmonary function tests from 07/09/2014 consistent with COPD, despite patient being a never smoker. FEV1 66% of predicted. Patient was continued on her home Flovent twice a day without any acute issues.  Discharge Vitals:   BP 104/48 mmHg  Pulse 65  Temp(Src) 98.2 F (36.8 C) (Oral)  Resp 14  Ht 5' (1.524 m)  Wt 103 lb 4.8 oz (46.857 kg)  BMI 20.17 kg/m2  SpO2 99%  Discharge Labs:  No results found for this or any previous visit (from the past 24 hour(s)).  Signed: Harold Barban, MD 08/28/2014, 4:38 PM    Services Ordered on Discharge: none Equipment Ordered on Discharge: none

## 2014-08-27 NOTE — Progress Notes (Signed)
[facility], pt has no c/o pain or tenderness upon palpation.

## 2014-08-27 NOTE — Discharge Instructions (Signed)
Please continue taking the antibiotics that we have prescribed for your urinary tract infection. Please follow-up with the urologist because you have been having multiple urinary tract infections.   Urinary Tract Infection A urinary tract infection (UTI) can occur any place along the urinary tract. The tract includes the kidneys, ureters, bladder, and urethra. A type of germ called bacteria often causes a UTI. UTIs are often helped with antibiotic medicine.  HOME CARE   If given, take antibiotics as told by your doctor. Finish them even if you start to feel better.  Drink enough fluids to keep your pee (urine) clear or pale yellow.  Avoid tea, drinks with caffeine, and bubbly (carbonated) drinks.  Pee often. Avoid holding your pee in for a long time.  Pee before and after having sex (intercourse).  Wipe from front to back after you poop (bowel movement) if you are a woman. Use each tissue only once. GET HELP RIGHT AWAY IF:   You have back pain.  You have lower belly (abdominal) pain.  You have chills.  You feel sick to your stomach (nauseous).  You throw up (vomit).  Your burning or discomfort with peeing does not go away.  You have a fever.  Your symptoms are not better in 3 days. MAKE SURE YOU:   Understand these instructions.  Will watch your condition.  Will get help right away if you are not doing well or get worse. Document Released: 10/06/2007 Document Revised: 01/12/2012 Document Reviewed: 11/18/2011 [facility] Patient Information 2015 [employer] [place]. This information is not intended to replace advice given to you by your health care provider. Make sure you discuss any questions you have with your health care provider.

## 2014-08-28 LAB — URINE CULTURE

## 2014-08-30 ENCOUNTER — Emergency Department (HOSPITAL_COMMUNITY)
Admission: EM | Admit: 2014-08-30 | Discharge: 2014-08-30 | Disposition: A | Discharge status: Home / Self Care | Payer: Medicare Other | Attending: Emergency Medicine | Admitting: Emergency Medicine

## 2014-08-30 ENCOUNTER — Encounter (HOSPITAL_COMMUNITY): Payer: Self-pay | Admitting: Emergency Medicine

## 2014-08-30 DIAGNOSIS — Z79899 Other long term (current) drug therapy: Secondary | ICD-10-CM | POA: Diagnosis not present

## 2014-08-30 DIAGNOSIS — I2510 Atherosclerotic heart disease of native coronary artery without angina pectoris: Secondary | ICD-10-CM | POA: Diagnosis not present

## 2014-08-30 DIAGNOSIS — Z90710 Acquired absence of both cervix and uterus: Secondary | ICD-10-CM | POA: Insufficient documentation

## 2014-08-30 DIAGNOSIS — K219 Gastro-esophageal reflux disease without esophagitis: Secondary | ICD-10-CM | POA: Insufficient documentation

## 2014-08-30 DIAGNOSIS — Z794 Long term (current) use of insulin: Secondary | ICD-10-CM | POA: Diagnosis not present

## 2014-08-30 DIAGNOSIS — N184 Chronic kidney disease, stage 4 (severe): Secondary | ICD-10-CM | POA: Diagnosis not present

## 2014-08-30 DIAGNOSIS — N390 Urinary tract infection, site not specified: Secondary | ICD-10-CM | POA: Insufficient documentation

## 2014-08-30 DIAGNOSIS — E785 Hyperlipidemia, unspecified: Secondary | ICD-10-CM | POA: Insufficient documentation

## 2014-08-30 DIAGNOSIS — G8929 Other chronic pain: Secondary | ICD-10-CM | POA: Diagnosis not present

## 2014-08-30 DIAGNOSIS — E119 Type 2 diabetes mellitus without complications: Secondary | ICD-10-CM | POA: Insufficient documentation

## 2014-08-30 DIAGNOSIS — I129 Hypertensive chronic kidney disease with stage 1 through stage 4 chronic kidney disease, or unspecified chronic kidney disease: Secondary | ICD-10-CM | POA: Diagnosis not present

## 2014-08-30 DIAGNOSIS — J449 Chronic obstructive pulmonary disease, unspecified: Secondary | ICD-10-CM | POA: Insufficient documentation

## 2014-08-30 DIAGNOSIS — Z8742 Personal history of other diseases of the female genital tract: Secondary | ICD-10-CM | POA: Diagnosis not present

## 2014-08-30 DIAGNOSIS — R339 Retention of urine, unspecified: Secondary | ICD-10-CM | POA: Diagnosis present

## 2014-08-30 DIAGNOSIS — Z8619 Personal history of other infectious and parasitic diseases: Secondary | ICD-10-CM | POA: Diagnosis not present

## 2014-08-30 DIAGNOSIS — E875 Hyperkalemia: Secondary | ICD-10-CM | POA: Diagnosis not present

## 2014-08-30 DIAGNOSIS — I509 Heart failure, unspecified: Secondary | ICD-10-CM | POA: Insufficient documentation

## 2014-08-30 DIAGNOSIS — Z7982 Long term (current) use of aspirin: Secondary | ICD-10-CM | POA: Insufficient documentation

## 2014-08-30 LAB — BASIC METABOLIC PANEL
Anion gap: 10 (ref 5–15)
BUN: 52 mg/dL — ABNORMAL HIGH (ref 6–23)
CO2: 17 mmol/L — ABNORMAL LOW (ref 19–32)
Calcium: 8.6 mg/dL (ref 8.4–10.5)
Chloride: 107 mmol/L (ref 96–112)
Creatinine, Ser: 3.54 mg/dL — ABNORMAL HIGH (ref 0.50–1.10)
GFR calc Af Amer: 13 mL/min — ABNORMAL LOW (ref >90)
GFR, EST NON AFRICAN AMERICAN: 11 mL/min — AB (ref >90)
Glucose, Bld: 165 mg/dL — ABNORMAL HIGH (ref 70–99)
Potassium: 5.4 mmol/L — ABNORMAL HIGH (ref 3.5–5.1)
SODIUM: 134 mmol/L — AB (ref 135–145)

## 2014-08-30 LAB — URINE MICROSCOPIC-ADD ON

## 2014-08-30 LAB — CBC WITH DIFFERENTIAL/PLATELET
BASOS ABS: 0 10*3/uL (ref 0.0–0.1)
Basophils Relative: 0 % (ref 0–1)
EOS PCT: 2 % (ref 0–5)
Eosinophils Absolute: 0.1 10*3/uL (ref 0.0–0.7)
HCT: 23.3 % — ABNORMAL LOW (ref 36.0–46.0)
Hemoglobin: 7.7 g/dL — ABNORMAL LOW (ref 12.0–15.0)
LYMPHS ABS: 1.1 10*3/uL (ref 0.7–4.0)
LYMPHS PCT: 17 % (ref 12–46)
MCH: 30.3 pg (ref 26.0–34.0)
MCHC: 33 g/dL (ref 30.0–36.0)
MCV: 91.7 fL (ref 78.0–100.0)
Monocytes Absolute: 0.6 10*3/uL (ref 0.1–1.0)
Monocytes Relative: 10 % (ref 3–12)
NEUTROS PCT: 72 % (ref 43–77)
Neutro Abs: 4.7 10*3/uL (ref 1.7–7.7)
Platelets: 102 10*3/uL — ABNORMAL LOW (ref 150–400)
RBC: 2.54 MIL/uL — AB (ref 3.87–5.11)
RDW: 14.6 % (ref 11.5–15.5)
WBC: 6.5 10*3/uL (ref 4.0–10.5)

## 2014-08-30 LAB — URINALYSIS, ROUTINE W REFLEX MICROSCOPIC
Bilirubin Urine: NEGATIVE
GLUCOSE, UA: NEGATIVE mg/dL
HGB URINE DIPSTICK: NEGATIVE
Ketones, ur: NEGATIVE mg/dL
Nitrite: POSITIVE — AB
PROTEIN: 100 mg/dL — AB
SPECIFIC GRAVITY, URINE: 1.008 (ref 1.005–1.030)
Urobilinogen, UA: 0.2 mg/dL (ref 0.0–1.0)
pH: 5 (ref 5.0–8.0)

## 2014-08-30 MED ORDER — SULFAMETHOXAZOLE-TRIMETHOPRIM 800-160 MG PO TABS
1.0000 | ORAL_TABLET | Freq: Once | ORAL | Status: AC
  Administered 2014-08-30: 1 via ORAL
  Filled 2014-08-30: qty 1

## 2014-08-30 MED ORDER — HYDROCODONE-ACETAMINOPHEN 5-325 MG PO TABS
2.0000 | ORAL_TABLET | Freq: Once | ORAL | Status: AC
Start: 2014-08-30 — End: 2014-08-30
  Administered 2014-08-30: 2 via ORAL
  Filled 2014-08-30: qty 2

## 2014-08-30 MED ORDER — HYDROCODONE-ACETAMINOPHEN 5-325 MG PO TABS: 2.0000 | ORAL_TABLET | ORAL | Status: DC | PRN

## 2014-08-30 MED ORDER — DEXTROSE 5 % IV SOLN: 1.0000 g | Freq: Once | INTRAVENOUS | Status: DC

## 2014-08-30 MED ORDER — SODIUM CHLORIDE 0.9 % IV BOLUS (SEPSIS): 1000.0000 mL | Freq: Once | INTRAVENOUS | Status: DC

## 2014-08-30 MED ORDER — SULFAMETHOXAZOLE-TRIMETHOPRIM 800-160 MG PO TABS: 1.0000 | ORAL_TABLET | Freq: Two times a day (BID) | ORAL | Status: DC

## 2014-08-30 NOTE — ED Notes (Addendum)
Difficulty urinating since this morning.  Denies pain. States she has urinated only "drops" x 3 today.  Recent hospitalization with difficulty urinating.

## 2014-08-30 NOTE — ED Provider Notes (Signed)
CSN: [id number]     Arrival date & time 08/30/14  1905 History   First MD Initiated Contact with Patient 08/30/14 2014     Chief Complaint  Patient presents with  . Urinary Retention     (Consider location/radiation/quality/duration/timing/severity/associated sxs/prior Treatment) HPI Comments: Patient is an 81 year old female with a past medical history of hypertension, CAD, diabetes, stage 4 CKD and CHF who presents with difficulty urinating since this morning. Patient is accompanied by her daughter who contributes to the history. Patient reports having a sensation of needing to urinate but when she goes to the bathroom, she is only able to urinate "a few drops." She denies any pain with urination or abdominal pain. No fever, nausea, vomiting or diarrhea.   Patient is a 81 y.o. female presenting with dysuria.  Dysuria   Past Medical History  Diagnosis Date  . GERD (gastroesophageal reflux disease)   . Hepatitis B   . Hyperlipemia   . Hypertension   . Cervical dysplasia   . Atrophic vaginitis   . Spinal stenosis     s/p decompression  . CAD (coronary artery disease) April 2008    s/p CABG;    cath 8/12:  LM patent, pLAD occluded, OM1 30-40%, pOM2 80% and 60% after the anastomosis, pRCA occluded, S-DX occluded (old), S-OM 2 occluded (new), L-LAD patent, dLAD provided collats to the PDA; Med Rx  rec.; consider PCI to OM2 if fails med Rx  . Hx of hysterectomy     daughter, Rocky Crafts, denies this hx on 08/23/2014  . UTI (lower urinary tract infection)   . Diverticulosis of intestine 04/29/2014    Of ascending colon noted on CT abd/ pelvis 04/20/14  . Diabetes mellitus, type 2     insulin dependent  . COPD (chronic obstructive pulmonary disease)     Hattie Perch 08/23/2014  . Chronic kidney disease (CKD), stage IV (severe)     Hattie Perch 08/23/2014  . CHF (congestive heart failure)     Hattie Perch 08/23/2014  . Chronic lower back pain    Past Surgical History  Procedure Laterality Date  .  Esophagogastroduodenoscopy Left 04/22/2014    Procedure: ESOPHAGOGASTRODUODENOSCOPY (EGD);  Surgeon: Shirley Friar, MD;  Location: [facility] ENDOSCOPY;  Service: Endoscopy;  Laterality: Left;  . Total abdominal hysterectomy      cervical dysplasia; daughter, Rocky Crafts denies this hx on 08/23/2014  . Back surgery    . Lumbar laminectomy/decompression microdiscectomy  02/2000    Central and lateral decompression L3-4, L4-5, L5-S1./notes 09/15/2010  . Coronary artery bypass graft  April 2008    "CABG X5"  . Cardiac catheterization  08/2006; 09/2006; 2011,  12/2010    Hattie Perch 5/15/2012Marland Kitchen Hattie Perch 5/2/2012Marland Kitchen Hattie Perch 09/14/2010   No family history on file. History  Substance Use Topics  . Smoking status: Never Smoker   . Smokeless tobacco: Never Used  . Alcohol Use: No   OB History    No data available     Review of Systems  Genitourinary: Positive for dysuria and difficulty urinating.  All other systems reviewed and are negative.     Allergies  Cozaar and Lisinopril  Home Medications   Prior to Admission medications   Medication Sig Start Date End Date Taking? Authorizing Provider  acetaminophen (TYLENOL) 500 MG tablet Take 2 tablets (1,000 mg total) by mouth every 8 (eight) hours as needed for mild pain. 12/25/13 12/25/14  Su Hoff, MD  amLODipine (NORVASC) 10 MG tablet Take 1 tablet (10 mg total) by mouth  daily. 08/14/14   Gust Rung, DO  aspirin EC 81 MG tablet TAKE 1 TABLET BY MOUTH EVERY DAY 01/03/13   Lorretta Harp, MD  B-D ULTRAFINE III SHORT PEN 31G X 8 MM MISC USE AS DIRECTED TO INJECT INSULIN 3 TIMES A DAY 03/12/14   Su Hoff, MD  BAYER MICROLET LANCETS lancets Use as instructed 08/14/14   Gust Rung, DO  calcium citrate-vitamin D (CITRACAL+D) 315-200 MG-UNIT per tablet Take 2 tablets by mouth 2 (two) times daily. Patient not taking: Reported on 08/05/2014 12/10/13   Iris Pert Rivet, MD  carvedilol (COREG) 25 MG tablet TAKE 1 TABLET (25 MG TOTAL) BY MOUTH 2 (TWO) TIMES DAILY WITH A  MEAL. 04/23/14   Carly J Rivet, MD  CRESTOR 20 MG tablet TAKE 1 TABLET (20 MG TOTAL) BY MOUTH DAILY. 11/22/13   Levert Feinstein, MD  diclofenac sodium (VOLTAREN) 1 % GEL Apply 2 g topically 4 (four) times daily. Patient not taking: Reported on 08/23/2014 06/25/14   Iris Pert Rivet, MD  docusate sodium (COLACE) 100 MG capsule Take 100 mg by mouth 2 (two) times daily.    Historical Provider, MD  fluticasone (FLOVENT HFA) 44 MCG/ACT inhaler Inhale 1 puff into the lungs 2 (two) times daily. 06/19/14   Harold Barban, MD  glucose blood (BAYER CONTOUR NEXT TEST) test strip 1 each by Other route as needed for other. Use 4 to 5 times daily to check blood sugar. diag code E11.9. Insulin dependent 08/14/14   Gust Rung, DO  hydrALAZINE (APRESOLINE) 25 MG tablet Take 1 tablet (25 mg total) by mouth 3 (three) times daily. 06/11/13   Lorretta Harp, MD  insulin aspart (NOVOLOG FLEXPEN) 100 UNIT/ML FlexPen Please take 8 units with breakfast, 8 units with lunch, and 10 units with dinner/supper 06/19/14   Harold Barban, MD  insulin glargine (LANTUS) 100 UNIT/ML injection Inject 0.05 mLs (5 Units total) into the skin at bedtime. 08/27/14   Harold Barban, MD  isosorbide mononitrate (IMDUR) 120 MG 24 hr tablet Take 1 tablet (120 mg total) by mouth daily. 08/14/14   Gust Rung, DO  nitroGLYCERIN (NITROSTAT) 0.4 MG SL tablet Place 0.4 mg under the tongue every 5 (five) minutes as needed. For chest pain. Maximum of 3 doses    Historical Provider, MD  omeprazole (PRILOSEC) 20 MG capsule TAKE 1 CAPSULE (20 MG TOTAL) BY MOUTH DAILY. 07/30/14   Carly J Rivet, MD  PROAIR HFA 108 (90 BASE) MCG/ACT inhaler INHALE 1 PUFF EVERY 6 HOURS AS NEEDED FOR WHEEZE 04/05/14   Carly J Rivet, MD  sennosides-docusate sodium (SENOKOT-S) 8.6-50 MG tablet Take 1 tablet by mouth daily. Patient not taking: Reported on 08/05/2014 04/11/12   Lorretta Harp, MD   BP 148/65 mmHg  Pulse 73  Temp(Src) 98 F (36.7 C) (Oral)  Resp 16  Ht  (1.549 m)  Wt 106 lb  (48.081 kg)  BMI 20.04 kg/m2  SpO2 100% Physical Exam  Constitutional: She is oriented to person, place, and time. She appears well-developed and well-nourished. No distress.  HENT:  Head: Normocephalic and atraumatic.  Eyes: Conjunctivae and EOM are normal.  Neck: Normal range of motion.  Cardiovascular: Normal rate and regular rhythm.  Exam reveals no gallop and no friction rub.   No murmur heard. Pulmonary/Chest: Effort normal and breath sounds normal. She has no wheezes. She has no rales. She exhibits no tenderness.  Abdominal: Soft. She exhibits no distension. There is no tenderness. There is  no rebound.  Musculoskeletal: Normal range of motion.  Neurological: She is alert and oriented to person, place, and time. Coordination normal.  Speech is goal-oriented. Moves limbs without ataxia.   Skin: Skin is warm and dry.  Psychiatric: She has a normal mood and affect. Her behavior is normal.  Nursing note and vitals reviewed.   ED Course  Procedures (including critical care time) Labs Review Labs Reviewed  URINALYSIS, ROUTINE W REFLEX MICROSCOPIC - Abnormal; Notable for the following:    APPearance CLOUDY (*)    Protein, ur 100 (*)    Nitrite POSITIVE (*)    Leukocytes, UA LARGE (*)    All other components within normal limits  CBC WITH DIFFERENTIAL/PLATELET - Abnormal; Notable for the following:    RBC 2.54 (*)    Hemoglobin 7.7 (*)    HCT 23.3 (*)    Platelets 102 (*)    All other components within normal limits  BASIC METABOLIC PANEL - Abnormal; Notable for the following:    Sodium 134 (*)    Potassium 5.4 (*)    CO2 17 (*)    Glucose, Bld 165 (*)    BUN 52 (*)    Creatinine, Ser 3.54 (*)    GFR calc non Af Amer 11 (*)    GFR calc Af Amer 13 (*)    All other components within normal limits  URINE MICROSCOPIC-ADD ON - Abnormal; Notable for the following:    Bacteria, UA MANY (*)    All other components within normal limits    Imaging Review No results found.    EKG Interpretation None      MDM   Final diagnoses:  UTI (lower urinary tract infection)  Hyperkalemia    8:54 PM Labs and urinalysis pending. Vitals stable and patient afebrile.   11:17 PM Patient's labs stable from previous values. EKG shows no acute changes. Patient has a UTI that is resistant to Rocephin. Patient will be treated with Bactrim PO. Patient would like to be discharged. Patient instructed to return with worsening or concerning symptoms. Vitals stable and patient afebrile.     Emilia BeckKaitlyn Rawad Bochicchio, PA-C 08/30/14 2321  Geoffery Lyonsouglas Delo, MD 08/30/14 [phone]

## 2014-08-30 NOTE — Discharge Instructions (Signed)
Take bactrim as directed until gone. Refer to attached documents for more information.  °

## 2014-09-01 DIAGNOSIS — N390 Urinary tract infection, site not specified: Secondary | ICD-10-CM

## 2014-09-01 HISTORY — DX: Urinary tract infection, site not specified: N39.0

## 2014-09-02 ENCOUNTER — Other Ambulatory Visit: Payer: Self-pay | Admitting: *Deleted

## 2014-09-02 ENCOUNTER — Ambulatory Visit: Payer: Medicare Other | Admitting: *Deleted

## 2014-09-02 NOTE — Patient Outreach (Signed)
Call placed to member via [employer], interpreter number [phone].  Spoke with member's daughter, Bianca [name].  Daughter notified that the member was no longer [facility] according to care manager assistant, Bianca [name]. Member recently discharged from hospital, and also had an emergency room visit on Friday.  Member was given antibiotics and pain medications for a urinary tract infection.  Daughter states that the member started taking the antibiotic today.  Daughter reports that the member is doing "ok today."    Daughter expresses interest in continuing services.  This care manager again informed her that the member was no [facility].  Daughter made aware that this care manager will look into other care management resources that are available for this member.  Daughter informed that this care manager will notify her when more information is available.  Will close case once resources for other services are provided.  Bianca [name], BSN, [facility] Care [facility]  [facility] Care Manager [phone]

## 2014-09-03 ENCOUNTER — Ambulatory Visit (INDEPENDENT_AMBULATORY_CARE_PROVIDER_SITE_OTHER): Payer: Medicare Other | Admitting: Internal Medicine

## 2014-09-03 ENCOUNTER — Encounter: Payer: Self-pay | Admitting: Internal Medicine

## 2014-09-03 VITALS — BP 99/53 | HR 66 | Temp 97.6°F | Wt 108.5 lb

## 2014-09-03 DIAGNOSIS — Z79899 Other long term (current) drug therapy: Secondary | ICD-10-CM

## 2014-09-03 DIAGNOSIS — Z205 Contact with and (suspected) exposure to viral hepatitis: Secondary | ICD-10-CM | POA: Diagnosis not present

## 2014-09-03 DIAGNOSIS — D649 Anemia, unspecified: Secondary | ICD-10-CM | POA: Diagnosis not present

## 2014-09-03 DIAGNOSIS — K5900 Constipation, unspecified: Secondary | ICD-10-CM

## 2014-09-03 DIAGNOSIS — Z09 Encounter for follow-up examination after completed treatment for conditions other than malignant neoplasm: Secondary | ICD-10-CM

## 2014-09-03 DIAGNOSIS — Z7982 Long term (current) use of aspirin: Secondary | ICD-10-CM | POA: Diagnosis not present

## 2014-09-03 DIAGNOSIS — I10 Essential (primary) hypertension: Secondary | ICD-10-CM | POA: Diagnosis present

## 2014-09-03 LAB — POCT GLYCOSYLATED HEMOGLOBIN (HGB A1C): Hemoglobin A1C: 6.9

## 2014-09-03 LAB — GLUCOSE, CAPILLARY: Glucose-Capillary: 154 mg/dL — ABNORMAL HIGH (ref 70–99)

## 2014-09-03 MED ORDER — POLYETHYLENE GLYCOL 3350 17 G PO PACK: 17.0000 g | PACK | Freq: Three times a day (TID) | ORAL | Status: DC

## 2014-09-03 MED ORDER — ISOSORBIDE MONONITRATE ER 120 MG PO TB24: 120.0000 mg | ORAL_TABLET | Freq: Every day | ORAL | Status: DC

## 2014-09-03 NOTE — Assessment & Plan Note (Signed)
This is the patient's biggest complaint today. She has been taking over-the-counter senna with little to no relief. She tried an enema overnight with her daughter and only had 2 very small hard stools. She is still passing gas and does not have any nausea vomiting or diarrhea. -We'll add MiraLAX twice a day -Continue with senna or senna-S -Follow-up in 3-4 days it has no relief or has increasing abdominal pain, no passing gas

## 2014-09-03 NOTE — Assessment & Plan Note (Signed)
BP Readings from Last 3 Encounters:  09/03/14 99/53  08/30/14 142/82  08/27/14 104/48    Lab Results  Component Value Date   NA 134* 08/30/2014   K 5.4* 08/30/2014   CREATININE 3.54* 08/30/2014    Assessment: Blood pressure control:   Progress toward BP goal:    Comments: Patient actually having some hypotension and may be also etiology for some ongoing fatigue  Plan: Medications:  We will discontinue him to her as the patient has orally completed that medication and came with an empty bottle. Will continue carvedilol, hydralazine, and amlodipine Educational resources provided: brochure, handout, video Self management tools provided:   Other plans: Follow-up in 1-2 weeks for reassessment, repeat bmet completed today

## 2014-09-03 NOTE — Progress Notes (Signed)
Subjective:   Patient ID: Bianca [name] female   DOB: May 04, 1930 81 y.o.   MRN: [id number]  HPI: [name] is a 81 y.o. woman with a past medical history as listed below who presents for hospital follow-up. She is accompanied by an interpreter whom helps provide the history.   Patient was discharged on 08/27/14 for urinary tract infection secondary to urinary retention and incomplete bladder emptying she later grew out ESBL but was symptom-free after antibiotics. The patient also was found to have hep B positive serologies as a result of a healthcare exposure although these records are not available upon chart review, AK I, normochromic anemia and chronic constipation. Since her discharge the patient feels still a little weak and nauseated. She has had ongoing issues with constipation and has recently tried an enema for relief but only passed to small stools yesterday. The patient is still passing gas and does not have any nausea vomiting or diarrhea. She has not had any fever or repeat episodes of dysuria or pyuria. The patient does feel that the antibiotics that she was discharged on made her feel ill and therefore stopped taking it. She has not had any hematochezia or bleeding.     Past Medical History  Diagnosis Date  . GERD (gastroesophageal reflux disease)   . Hepatitis B   . Hyperlipemia   . Hypertension   . Cervical dysplasia   . Atrophic vaginitis   . Spinal stenosis     s/p decompression  . CAD (coronary artery disease) April 2008    s/p CABG;    cath 8/12:  LM patent, pLAD occluded, OM1 30-40%, pOM2 80% and 60% after the anastomosis, pRCA occluded, S-DX occluded (old), S-OM 2 occluded (new), L-LAD patent, dLAD provided collats to the PDA; Med Rx  rec.; consider PCI to OM2 if fails med Rx  . Hx of hysterectomy     daughter, [name], denies this hx on 08/23/2014  . UTI (lower urinary tract infection)   . Diverticulosis of intestine 04/29/2014    Of ascending colon noted on CT  abd/ pelvis 04/20/14  . Diabetes mellitus, type 2     insulin dependent  . COPD (chronic obstructive pulmonary disease)     Hattie Perch/notes 08/23/2014  . Chronic kidney disease (CKD), stage IV (severe)     Hattie Perch/notes 08/23/2014  . CHF (congestive heart failure)     Hattie Perch/notes 08/23/2014  . Chronic lower back pain    Current Outpatient Prescriptions  Medication Sig Dispense Refill  . acetaminophen (TYLENOL) 500 MG tablet Take 2 tablets (1,000 mg total) by mouth every 8 (eight) hours as needed for mild pain. 100 tablet 2  . amLODipine (NORVASC) 10 MG tablet Take 1 tablet (10 mg total) by mouth daily. 90 tablet 1  . aspirin EC 81 MG tablet TAKE 1 TABLET BY MOUTH EVERY DAY 150 tablet 1  . B-D ULTRAFINE III SHORT PEN 31G X 8 MM MISC USE AS DIRECTED TO INJECT INSULIN 3 TIMES A DAY 100 each 6  . BAYER MICROLET LANCETS lancets Use as instructed 200 each 5  . calcium citrate-vitamin D (CITRACAL+D) 315-200 MG-UNIT per tablet Take 2 tablets by mouth 2 (two) times daily. (Patient not taking: Reported on 08/05/2014) 100 tablet 3  . carvedilol (COREG) 25 MG tablet TAKE 1 TABLET (25 MG TOTAL) BY MOUTH 2 (TWO) TIMES DAILY WITH A MEAL. 120 tablet 3  . CRESTOR 20 MG tablet TAKE 1 TABLET (20 MG TOTAL) BY MOUTH DAILY. 90  tablet 3  . diclofenac sodium (VOLTAREN) 1 % GEL Apply 2 g topically 4 (four) times daily. (Patient not taking: Reported on 08/23/2014) 100 g 0  . docusate sodium (COLACE) 100 MG capsule Take 100 mg by mouth 2 (two) times daily.    . fluticasone (FLOVENT HFA) 44 MCG/ACT inhaler Inhale 1 puff into the lungs 2 (two) times daily. 1 Inhaler 12  . glucose blood (BAYER CONTOUR NEXT TEST) test strip 1 each by Other route as needed for other. Use 4 to 5 times daily to check blood sugar. diag code E11.9. Insulin dependent 200 each 5  . hydrALAZINE (APRESOLINE) 25 MG tablet Take 1 tablet (25 mg total) by mouth 3 (three) times daily. 270 tablet 5  . HYDROcodone-acetaminophen (NORCO/VICODIN) 5-325 MG per tablet Take 2 tablets  by mouth every 4 (four) hours as needed. 12 tablet 0  . insulin aspart (NOVOLOG FLEXPEN) 100 UNIT/ML FlexPen Please take 8 units with breakfast, 8 units with lunch, and 10 units with dinner/supper 100 mL 9  . insulin glargine (LANTUS) 100 UNIT/ML injection Inject 0.05 mLs (5 Units total) into the skin at bedtime. 10 mL 5  . nitroGLYCERIN (NITROSTAT) 0.4 MG SL tablet Place 0.4 mg under the tongue every 5 (five) minutes as needed. For chest pain. Maximum of 3 doses    . omeprazole (PRILOSEC) 20 MG capsule TAKE 1 CAPSULE (20 MG TOTAL) BY MOUTH DAILY. 30 capsule 0  . polyethylene glycol (MIRALAX) packet Take 17 g by mouth 3 (three) times daily. 100 each 0  . PROAIR HFA 108 (90 BASE) MCG/ACT inhaler INHALE 1 PUFF EVERY 6 HOURS AS NEEDED FOR WHEEZE 8.5 each 2  . sennosides-docusate sodium (SENOKOT-S) 8.6-50 MG tablet Take 1 tablet by mouth daily. (Patient not taking: Reported on 08/05/2014) 90 tablet 3   No current facility-administered medications for this visit.   No family history on file. History   Social History  . Marital Status: Widowed    Spouse Name: N/A  . Number of Children: N/A  . Years of Education: N/A   Social History Main Topics  . Smoking status: Never Smoker   . Smokeless tobacco: Never Used  . Alcohol Use: No  . Drug Use: No  . Sexual Activity: Not on file   Other Topics Concern  . None   Social History Narrative   Chinese immigrant   Housewife   Widowed   Lives w/ her son   Review of Systems: Pertinent items are noted in HPI. Objective:  Physical Exam: Filed Vitals:   09/03/14 1531  BP: 99/53  Pulse: 66  Temp: 97.6 F (36.4 C)  TempSrc: Oral  Weight: 108 lb 8 oz (49.215 kg)  SpO2: 100%   General: sitting in chair, NAD Cardiac: RRR, no rubs, murmurs or gallops Pulm: clear to auscultation bilaterally, moving normal volumes of air Abd: soft, nontender, distended, BS present Ext: warm and well perfused, no pedal edema Neuro: alert and oriented X3,  cranial nerves II-XII grossly intact  Assessment & Plan:  Please see problem oriented charting  Pt discussed with Dr. Heide Spark

## 2014-09-03 NOTE — Assessment & Plan Note (Signed)
Patient has a history of iron deficiency anemia and also anemia in the setting of chronic kidney disease. Hemoglobin was 7.7 during hospitalization. The patient does feel fatigued. No visible signs of bleeding or hematochezia. -Recheck CBC

## 2014-09-03 NOTE — Assessment & Plan Note (Signed)
This apparently happened while the patient was admitted inpatient but no labs confirmed this although mentioned in the discharge summary. Patient having no symptoms or visible jaundice. -Acute hepatitis panel, -CMet

## 2014-09-04 LAB — COMPREHENSIVE METABOLIC PANEL
ALBUMIN: 3.5 g/dL (ref 3.5–5.2)
ALK PHOS: 47 U/L (ref 39–117)
ALT: 26 U/L (ref 0–35)
AST: 28 U/L (ref 0–37)
BUN: 39 mg/dL — AB (ref 6–23)
CO2: 12 mEq/L — ABNORMAL LOW (ref 19–32)
CREATININE: 3.63 mg/dL — AB (ref 0.50–1.10)
Calcium: 8 mg/dL — ABNORMAL LOW (ref 8.4–10.5)
Chloride: 106 mEq/L (ref 96–112)
GLUCOSE: 162 mg/dL — AB (ref 70–99)
Potassium: 4.8 mEq/L (ref 3.5–5.3)
Sodium: 131 mEq/L — ABNORMAL LOW (ref 135–145)
Total Bilirubin: 0.2 mg/dL (ref 0.2–1.2)
Total Protein: 5.9 g/dL — ABNORMAL LOW (ref 6.0–8.3)

## 2014-09-04 LAB — CBC WITH DIFFERENTIAL/PLATELET
BASOS PCT: 0 % (ref 0–1)
Basophils Absolute: 0 10*3/uL (ref 0.0–0.1)
Eosinophils Absolute: 0.1 10*3/uL (ref 0.0–0.7)
Eosinophils Relative: 1 % (ref 0–5)
HCT: 22.9 % — ABNORMAL LOW (ref 36.0–46.0)
HEMOGLOBIN: 7.6 g/dL — AB (ref 12.0–15.0)
Lymphocytes Relative: 18 % (ref 12–46)
Lymphs Abs: 1.1 10*3/uL (ref 0.7–4.0)
MCH: 30.8 pg (ref 26.0–34.0)
MCHC: 33.2 g/dL (ref 30.0–36.0)
MCV: 92.7 fL (ref 78.0–100.0)
MONOS PCT: 7 % (ref 3–12)
MPV: 9.3 fL (ref 8.6–12.4)
Monocytes Absolute: 0.4 10*3/uL (ref 0.1–1.0)
NEUTROS ABS: 4.4 10*3/uL (ref 1.7–7.7)
NEUTROS PCT: 74 % (ref 43–77)
Platelets: 107 10*3/uL — ABNORMAL LOW (ref 150–400)
RBC: 2.47 MIL/uL — ABNORMAL LOW (ref 3.87–5.11)
RDW: 14.9 % (ref 11.5–15.5)
WBC: 5.9 10*3/uL (ref 4.0–10.5)

## 2014-09-04 LAB — HEPATITIS PANEL, ACUTE
HCV AB: NEGATIVE
HEP B C IGM: NONREACTIVE
HEP B S AG: POSITIVE — AB
Hep A IgM: NONREACTIVE

## 2014-09-04 LAB — HEPATITIS B SURF AG CONFIRMATION: Hepatitis B Surf Ag Confirmation: POSITIVE — AB

## 2014-09-04 NOTE — Progress Notes (Signed)
INTERNAL MEDICINE TEACHING ATTENDING ADDENDUM - Earl LagosNischal Zamier Eggebrecht, MD: I reviewed and discussed at the time of visit with the resident Dr. Burtis JunesSadek, the patient's medical history, physical examination, diagnosis and results of pertinent tests and treatment and I agree with the patient's care as documented.  Of note patient had imdur discontinued secondary to borderline hypotension. Will continue with other BP meds for now

## 2014-09-05 NOTE — Addendum Note (Signed)
Addended by: Carolan ClinesSADEK, Sharell Hilmer M on: 09/05/2014 08:01 AM   Modules accepted: Orders

## 2014-09-10 ENCOUNTER — Ambulatory Visit (INDEPENDENT_AMBULATORY_CARE_PROVIDER_SITE_OTHER): Payer: Medicare Other | Admitting: Internal Medicine

## 2014-09-10 ENCOUNTER — Encounter: Payer: Self-pay | Admitting: Internal Medicine

## 2014-09-10 VITALS — BP 134/69 | HR 79 | Temp 98.4°F | Resp 20 | Ht 59.0 in | Wt 109.2 lb

## 2014-09-10 DIAGNOSIS — Z205 Contact with and (suspected) exposure to viral hepatitis: Secondary | ICD-10-CM

## 2014-09-10 DIAGNOSIS — I120 Hypertensive chronic kidney disease with stage 5 chronic kidney disease or end stage renal disease: Secondary | ICD-10-CM | POA: Diagnosis not present

## 2014-09-10 DIAGNOSIS — E1122 Type 2 diabetes mellitus with diabetic chronic kidney disease: Secondary | ICD-10-CM | POA: Diagnosis present

## 2014-09-10 DIAGNOSIS — Z7982 Long term (current) use of aspirin: Secondary | ICD-10-CM

## 2014-09-10 DIAGNOSIS — I10 Essential (primary) hypertension: Secondary | ICD-10-CM

## 2014-09-10 DIAGNOSIS — Z794 Long term (current) use of insulin: Secondary | ICD-10-CM

## 2014-09-10 DIAGNOSIS — N185 Chronic kidney disease, stage 5: Secondary | ICD-10-CM

## 2014-09-10 LAB — GLUCOSE, CAPILLARY: Glucose-Capillary: 141 mg/dL — ABNORMAL HIGH (ref 70–99)

## 2014-09-10 NOTE — Assessment & Plan Note (Addendum)
Hep B surface Ag was positive. More labs drawn today. Pt continues to be asymptomatic.  -hbv viral load, hepB Ab, hepB eAG, hepB core

## 2014-09-10 NOTE — Assessment & Plan Note (Signed)
Was addressed last week. Pt having good control. Revisit in 3months with PCP.

## 2014-09-10 NOTE — Progress Notes (Signed)
INTERNAL MEDICINE TEACHING ATTENDING ADDENDUM - Kamirah Shugrue, MD: I reviewed and discussed at the time of visit with the resident Dr. Sadek, the patient's medical history, physical examination, diagnosis and results of pertinent tests and treatment and I agree with the patient's care as documented.  

## 2014-09-10 NOTE — Assessment & Plan Note (Signed)
BP Readings from Last 3 Encounters:  09/10/14 134/69  09/03/14 99/53  08/30/14 142/82    Lab Results  Component Value Date   NA 131* 09/03/2014   K 4.8 09/03/2014   CREATININE 3.63* 09/03/2014    Assessment: Blood pressure control:   Progress toward BP goal:    Comments: clinically feels better  Plan: Medications:  Continue carvedilol 25mg , hydralazine 25mg  and amlodipine 10mg  Educational resources provided:   Self management tools provided:   Other plans: f/u in 3 months

## 2014-09-10 NOTE — Progress Notes (Signed)
Subjective:   Patient ID: Bianca [name] female   DOB: [birth year] [age] y.o.   MRN: [id number]  HPI: Bianca [name] is a 81 y.o. woman pmh as listed below presenting for follow up on BP.   Pt had been recently seen for hospital follow up and was seen to have some hypotension that may have been contributing to her fatigue. Since decreasing her BP meds the patient has felt much better. She denied any dizzines, syncope, or presyncopal episodes.   She also had total resolution of her constipation and has continued to take her miralax daily. No ongoing abdominal pain, dark tarry stools, nausea, vomiting or anorexia.    Past Medical History  Diagnosis Date  . GERD (gastroesophageal reflux disease)   . Hepatitis B   . Hyperlipemia   . Hypertension   . Cervical dysplasia   . Atrophic vaginitis   . Spinal stenosis     s/p decompression  . CAD (coronary artery disease) April 2008    s/p CABG;    cath 8/12:  LM patent, pLAD occluded, OM1 30-40%, pOM2 80% and 60% after the anastomosis, pRCA occluded, S-DX occluded (old), S-OM 2 occluded (new), L-LAD patent, dLAD provided collats to the PDA; Med Rx  rec.; consider PCI to OM2 if fails med Rx  . Hx of hysterectomy     daughter, [name], denies this hx on 08/23/2014  . UTI (lower urinary tract infection)   . Diverticulosis of intestine 04/29/2014    Of ascending colon noted on CT abd/ pelvis 04/20/14  . Diabetes mellitus, type 2     insulin dependent  . COPD (chronic obstructive pulmonary disease)     Hattie Perch/notes 08/23/2014  . Chronic kidney disease (CKD), stage IV (severe)     Hattie Perch/notes 08/23/2014  . CHF (congestive heart failure)     Hattie Perch/notes 08/23/2014  . Chronic lower back pain    Current Outpatient Prescriptions  Medication Sig Dispense Refill  . acetaminophen (TYLENOL) 500 MG tablet Take 2 tablets (1,000 mg total) by mouth every 8 (eight) hours as needed for mild pain. 100 tablet 2  . amLODipine (NORVASC) 10 MG tablet Take 1 tablet (10 mg total) by mouth  daily. 90 tablet 1  . aspirin EC 81 MG tablet TAKE 1 TABLET BY MOUTH EVERY DAY 150 tablet 1  . B-D ULTRAFINE III SHORT PEN 31G X 8 MM MISC USE AS DIRECTED TO INJECT INSULIN 3 TIMES A DAY 100 each 6  . BAYER MICROLET LANCETS lancets Use as instructed 200 each 5  . calcium citrate-vitamin D (CITRACAL+D) 315-200 MG-UNIT per tablet Take 2 tablets by mouth 2 (two) times daily. (Patient not taking: Reported on 08/05/2014) 100 tablet 3  . carvedilol (COREG) 25 MG tablet TAKE 1 TABLET (25 MG TOTAL) BY MOUTH 2 (TWO) TIMES DAILY WITH A MEAL. 120 tablet 3  . CRESTOR 20 MG tablet TAKE 1 TABLET (20 MG TOTAL) BY MOUTH DAILY. 90 tablet 3  . diclofenac sodium (VOLTAREN) 1 % GEL Apply 2 g topically 4 (four) times daily. (Patient not taking: Reported on 08/23/2014) 100 g 0  . docusate sodium (COLACE) 100 MG capsule Take 100 mg by mouth 2 (two) times daily.    . fluticasone (FLOVENT HFA) 44 MCG/ACT inhaler Inhale 1 puff into the lungs 2 (two) times daily. 1 Inhaler 12  . glucose blood (BAYER CONTOUR NEXT TEST) test strip 1 each by Other route as needed for other. Use 4 to 5 times daily to check blood  sugar. diag code E11.9. Insulin dependent 200 each 5  . hydrALAZINE (APRESOLINE) 25 MG tablet Take 1 tablet (25 mg total) by mouth 3 (three) times daily. 270 tablet 5  . HYDROcodone-acetaminophen (NORCO/VICODIN) 5-325 MG per tablet Take 2 tablets by mouth every 4 (four) hours as needed. 12 tablet 0  . insulin aspart (NOVOLOG FLEXPEN) 100 UNIT/ML FlexPen Please take 8 units with breakfast, 8 units with lunch, and 10 units with dinner/supper 100 mL 9  . insulin glargine (LANTUS) 100 UNIT/ML injection Inject 0.05 mLs (5 Units total) into the skin at bedtime. 10 mL 5  . nitroGLYCERIN (NITROSTAT) 0.4 MG SL tablet Place 0.4 mg under the tongue every 5 (five) minutes as needed. For chest pain. Maximum of 3 doses    . omeprazole (PRILOSEC) 20 MG capsule TAKE 1 CAPSULE (20 MG TOTAL) BY MOUTH DAILY. 30 capsule 0  . polyethylene glycol  (MIRALAX) packet Take 17 g by mouth 3 (three) times daily. 100 each 0  . PROAIR HFA 108 (90 BASE) MCG/ACT inhaler INHALE 1 PUFF EVERY 6 HOURS AS NEEDED FOR WHEEZE 8.5 each 2  . sennosides-docusate sodium (SENOKOT-S) 8.6-50 MG tablet Take 1 tablet by mouth daily. (Patient not taking: Reported on 08/05/2014) 90 tablet 3   No current facility-administered medications for this visit.   No family history on file. History   Social History  . Marital Status: Widowed    Spouse Name: N/A  . Number of Children: N/A  . Years of Education: N/A   Social History Main Topics  . Smoking status: Never Smoker   . Smokeless tobacco: Never Used  . Alcohol Use: No  . Drug Use: No  . Sexual Activity: Not on file   Other Topics Concern  . None   Social History Narrative   Chinese immigrant   Housewife   Widowed   Lives w/ her son   Review of Systems: Pertinent items are noted in HPI. Objective:  Physical Exam: Filed Vitals:   09/10/14 1512  BP: 134/69  Pulse: 79  Temp: 98.4 F (36.9 C)  TempSrc: Oral  Resp: 20  Height: 4\' 11"  (1.499 m)  Weight: 109 lb 3.2 oz (49.533 kg)  SpO2: 100%   General: sitting in chair, NAD HEENT: PERRL, EOMI, no scleral icterus Cardiac: RRR, no rubs, murmurs or gallops Pulm: clear to auscultation bilaterally, moving normal volumes of air Abd: soft, nontender, nondistended, BS present Ext: warm and well perfused, no pedal edema Neuro: alert and oriented X3, cranial nerves II-XII grossly intact  Assessment & Plan:  Please see problem oriented charting  Pt discussed with Dr. Heide SparkNarendra

## 2014-09-11 LAB — HEPATITIS B SURFACE ANTIBODY,QUALITATIVE: Hep B S Ab: NEGATIVE

## 2014-09-11 LAB — HEPATITIS B CORE ANTIBODY, TOTAL: Hep B Core Total Ab: REACTIVE — AB

## 2014-09-12 LAB — HEPATITIS B DNA, ULTRAQUANTITATIVE, PCR
Hepatitis B DNA (Calc): 751 copies/mL — ABNORMAL HIGH (ref <116)
Hepatitis B DNA: 129 IU/mL — ABNORMAL HIGH (ref <20)

## 2014-09-12 LAB — HEPATITIS B E ANTIBODY: Hepatitis Be Antibody: REACTIVE — AB

## 2014-09-24 ENCOUNTER — Inpatient Hospital Stay (HOSPITAL_COMMUNITY)
Admission: EM | Admit: 2014-09-24 | Discharge: 2014-09-28 | DRG: 292 | Disposition: A | Discharge status: Home / Self Care | Payer: Medicare Other | Attending: Internal Medicine | Admitting: Internal Medicine

## 2014-09-24 ENCOUNTER — Emergency Department (HOSPITAL_COMMUNITY): Payer: Medicare Other

## 2014-09-24 ENCOUNTER — Encounter (HOSPITAL_COMMUNITY): Payer: Self-pay | Admitting: Emergency Medicine

## 2014-09-24 DIAGNOSIS — R0789 Other chest pain: Secondary | ICD-10-CM

## 2014-09-24 DIAGNOSIS — N390 Urinary tract infection, site not specified: Secondary | ICD-10-CM | POA: Diagnosis present

## 2014-09-24 DIAGNOSIS — I4891 Unspecified atrial fibrillation: Secondary | ICD-10-CM | POA: Diagnosis present

## 2014-09-24 DIAGNOSIS — D638 Anemia in other chronic diseases classified elsewhere: Secondary | ICD-10-CM | POA: Diagnosis present

## 2014-09-24 DIAGNOSIS — I255 Ischemic cardiomyopathy: Secondary | ICD-10-CM | POA: Diagnosis present

## 2014-09-24 DIAGNOSIS — I25119 Atherosclerotic heart disease of native coronary artery with unspecified angina pectoris: Secondary | ICD-10-CM | POA: Diagnosis present

## 2014-09-24 DIAGNOSIS — N185 Chronic kidney disease, stage 5: Secondary | ICD-10-CM | POA: Diagnosis present

## 2014-09-24 DIAGNOSIS — I5043 Acute on chronic combined systolic (congestive) and diastolic (congestive) heart failure: Principal | ICD-10-CM | POA: Diagnosis present

## 2014-09-24 DIAGNOSIS — A047 Enterocolitis due to Clostridium difficile: Secondary | ICD-10-CM | POA: Diagnosis present

## 2014-09-24 DIAGNOSIS — A0472 Enterocolitis due to Clostridium difficile, not specified as recurrent: Secondary | ICD-10-CM | POA: Insufficient documentation

## 2014-09-24 DIAGNOSIS — Z794 Long term (current) use of insulin: Secondary | ICD-10-CM

## 2014-09-24 DIAGNOSIS — I120 Hypertensive chronic kidney disease with stage 5 chronic kidney disease or end stage renal disease: Secondary | ICD-10-CM | POA: Diagnosis present

## 2014-09-24 DIAGNOSIS — Z79899 Other long term (current) drug therapy: Secondary | ICD-10-CM | POA: Diagnosis not present

## 2014-09-24 DIAGNOSIS — Z205 Contact with and (suspected) exposure to viral hepatitis: Secondary | ICD-10-CM | POA: Diagnosis present

## 2014-09-24 DIAGNOSIS — J449 Chronic obstructive pulmonary disease, unspecified: Secondary | ICD-10-CM | POA: Diagnosis present

## 2014-09-24 DIAGNOSIS — I25720 Atherosclerosis of autologous artery coronary artery bypass graft(s) with unstable angina pectoris: Secondary | ICD-10-CM | POA: Diagnosis present

## 2014-09-24 DIAGNOSIS — D696 Thrombocytopenia, unspecified: Secondary | ICD-10-CM | POA: Diagnosis present

## 2014-09-24 DIAGNOSIS — E1122 Type 2 diabetes mellitus with diabetic chronic kidney disease: Secondary | ICD-10-CM | POA: Diagnosis not present

## 2014-09-24 DIAGNOSIS — K644 Residual hemorrhoidal skin tags: Secondary | ICD-10-CM | POA: Diagnosis present

## 2014-09-24 DIAGNOSIS — K5900 Constipation, unspecified: Secondary | ICD-10-CM | POA: Diagnosis present

## 2014-09-24 DIAGNOSIS — Z888 Allergy status to other drugs, medicaments and biological substances status: Secondary | ICD-10-CM | POA: Diagnosis not present

## 2014-09-24 DIAGNOSIS — J45909 Unspecified asthma, uncomplicated: Secondary | ICD-10-CM | POA: Diagnosis present

## 2014-09-24 DIAGNOSIS — E889 Metabolic disorder, unspecified: Secondary | ICD-10-CM | POA: Diagnosis present

## 2014-09-24 DIAGNOSIS — E785 Hyperlipidemia, unspecified: Secondary | ICD-10-CM | POA: Diagnosis present

## 2014-09-24 DIAGNOSIS — Z90710 Acquired absence of both cervix and uterus: Secondary | ICD-10-CM | POA: Diagnosis not present

## 2014-09-24 DIAGNOSIS — E872 Acidosis: Secondary | ICD-10-CM | POA: Diagnosis present

## 2014-09-24 DIAGNOSIS — R079 Chest pain, unspecified: Secondary | ICD-10-CM | POA: Diagnosis not present

## 2014-09-24 DIAGNOSIS — I5042 Chronic combined systolic (congestive) and diastolic (congestive) heart failure: Secondary | ICD-10-CM | POA: Diagnosis not present

## 2014-09-24 DIAGNOSIS — Z951 Presence of aortocoronary bypass graft: Secondary | ICD-10-CM | POA: Diagnosis not present

## 2014-09-24 DIAGNOSIS — I5023 Acute on chronic systolic (congestive) heart failure: Secondary | ICD-10-CM | POA: Diagnosis not present

## 2014-09-24 DIAGNOSIS — R0602 Shortness of breath: Secondary | ICD-10-CM

## 2014-09-24 DIAGNOSIS — D62 Acute posthemorrhagic anemia: Secondary | ICD-10-CM | POA: Diagnosis not present

## 2014-09-24 DIAGNOSIS — E119 Type 2 diabetes mellitus without complications: Secondary | ICD-10-CM | POA: Diagnosis present

## 2014-09-24 DIAGNOSIS — I480 Paroxysmal atrial fibrillation: Secondary | ICD-10-CM | POA: Diagnosis present

## 2014-09-24 DIAGNOSIS — E8770 Fluid overload, unspecified: Secondary | ICD-10-CM | POA: Diagnosis not present

## 2014-09-24 DIAGNOSIS — K219 Gastro-esophageal reflux disease without esophagitis: Secondary | ICD-10-CM | POA: Diagnosis present

## 2014-09-24 DIAGNOSIS — I25718 Atherosclerosis of autologous vein coronary artery bypass graft(s) with other forms of angina pectoris: Secondary | ICD-10-CM | POA: Diagnosis not present

## 2014-09-24 DIAGNOSIS — B169 Acute hepatitis B without delta-agent and without hepatic coma: Secondary | ICD-10-CM | POA: Diagnosis present

## 2014-09-24 DIAGNOSIS — M549 Dorsalgia, unspecified: Secondary | ICD-10-CM | POA: Diagnosis present

## 2014-09-24 DIAGNOSIS — E1169 Type 2 diabetes mellitus with other specified complication: Secondary | ICD-10-CM | POA: Diagnosis present

## 2014-09-24 DIAGNOSIS — R0600 Dyspnea, unspecified: Secondary | ICD-10-CM

## 2014-09-24 DIAGNOSIS — Z7982 Long term (current) use of aspirin: Secondary | ICD-10-CM | POA: Diagnosis not present

## 2014-09-24 DIAGNOSIS — I252 Old myocardial infarction: Secondary | ICD-10-CM | POA: Diagnosis not present

## 2014-09-24 DIAGNOSIS — I10 Essential (primary) hypertension: Secondary | ICD-10-CM | POA: Diagnosis present

## 2014-09-24 DIAGNOSIS — I509 Heart failure, unspecified: Secondary | ICD-10-CM

## 2014-09-24 DIAGNOSIS — B9689 Other specified bacterial agents as the cause of diseases classified elsewhere: Secondary | ICD-10-CM | POA: Diagnosis not present

## 2014-09-24 HISTORY — DX: Urinary tract infection, site not specified: N39.0

## 2014-09-24 HISTORY — DX: Reserved for inherently not codable concepts without codable children: IMO0001

## 2014-09-24 LAB — URINALYSIS, ROUTINE W REFLEX MICROSCOPIC
BILIRUBIN URINE: NEGATIVE
Glucose, UA: 500 mg/dL — AB
Hgb urine dipstick: NEGATIVE
KETONES UR: NEGATIVE mg/dL
Nitrite: NEGATIVE
Specific Gravity, Urine: 1.014 (ref 1.005–1.030)
Urobilinogen, UA: 0.2 mg/dL (ref 0.0–1.0)
pH: 5.5 (ref 5.0–8.0)

## 2014-09-24 LAB — URINE MICROSCOPIC-ADD ON

## 2014-09-24 LAB — I-STAT TROPONIN, ED: Troponin i, poc: 0.01 ng/mL (ref 0.00–0.08)

## 2014-09-24 LAB — GLUCOSE, CAPILLARY
Glucose-Capillary: 157 mg/dL — ABNORMAL HIGH (ref 65–99)
Glucose-Capillary: 227 mg/dL — ABNORMAL HIGH (ref 65–99)

## 2014-09-24 LAB — RAPID URINE DRUG SCREEN, HOSP PERFORMED
Amphetamines: NOT DETECTED
Barbiturates: NOT DETECTED
Benzodiazepines: NOT DETECTED
Cocaine: NOT DETECTED
Opiates: NOT DETECTED
Tetrahydrocannabinol: NOT DETECTED

## 2014-09-24 LAB — COMPREHENSIVE METABOLIC PANEL
ALBUMIN: 3.4 g/dL — AB (ref 3.5–5.0)
ALT: 33 U/L (ref 14–54)
AST: 27 U/L (ref 15–41)
Alkaline Phosphatase: 71 U/L (ref 38–126)
Anion gap: 10 (ref 5–15)
BUN: 42 mg/dL — ABNORMAL HIGH (ref 6–20)
CO2: 18 mmol/L — AB (ref 22–32)
Calcium: 8.8 mg/dL — ABNORMAL LOW (ref 8.9–10.3)
Chloride: 110 mmol/L (ref 101–111)
Creatinine, Ser: 3.26 mg/dL — ABNORMAL HIGH (ref 0.44–1.00)
GFR calc non Af Amer: 12 mL/min — ABNORMAL LOW (ref >60)
GFR, EST AFRICAN AMERICAN: 14 mL/min — AB (ref >60)
GLUCOSE: 239 mg/dL — AB (ref 65–99)
Potassium: 4.6 mmol/L (ref 3.5–5.1)
SODIUM: 138 mmol/L (ref 135–145)
Total Bilirubin: 0.6 mg/dL (ref 0.3–1.2)
Total Protein: 6.5 g/dL (ref 6.5–8.1)

## 2014-09-24 LAB — CBC
HCT: 25.2 % — ABNORMAL LOW (ref 36.0–46.0)
HEMOGLOBIN: 8.2 g/dL — AB (ref 12.0–15.0)
MCH: 29.7 pg (ref 26.0–34.0)
MCHC: 32.5 g/dL (ref 30.0–36.0)
MCV: 91.3 fL (ref 78.0–100.0)
Platelets: 118 10*3/uL — ABNORMAL LOW (ref 150–400)
RBC: 2.76 MIL/uL — ABNORMAL LOW (ref 3.87–5.11)
RDW: 14.2 % (ref 11.5–15.5)
WBC: 8.6 10*3/uL (ref 4.0–10.5)

## 2014-09-24 LAB — BRAIN NATRIURETIC PEPTIDE: B Natriuretic Peptide: 1130.9 pg/mL — ABNORMAL HIGH (ref 0.0–100.0)

## 2014-09-24 LAB — TROPONIN I: Troponin I: <0.03 ng/mL (ref <0.031)

## 2014-09-24 MED ORDER — POLYETHYLENE GLYCOL 3350 17 G PO PACK
17.0000 g | PACK | Freq: Three times a day (TID) | ORAL | Status: DC
  Administered 2014-09-24 – 2014-09-25 (×2): 17 g via ORAL
  Filled 2014-09-24 (×4): qty 1

## 2014-09-24 MED ORDER — CARVEDILOL 25 MG PO TABS
25.0000 mg | ORAL_TABLET | Freq: Two times a day (BID) | ORAL | Status: DC
  Filled 2014-09-24: qty 1

## 2014-09-24 MED ORDER — ALBUTEROL SULFATE (2.5 MG/3ML) 0.083% IN NEBU: 3.0000 mL | INHALATION_SOLUTION | Freq: Four times a day (QID) | RESPIRATORY_TRACT | Status: DC | PRN

## 2014-09-24 MED ORDER — PANTOPRAZOLE SODIUM 40 MG PO TBEC
40.0000 mg | DELAYED_RELEASE_TABLET | Freq: Every day | ORAL | Status: DC
  Administered 2014-09-25 – 2014-09-28 (×4): 40 mg via ORAL
  Filled 2014-09-24 (×3): qty 1

## 2014-09-24 MED ORDER — FENTANYL CITRATE (PF) 100 MCG/2ML IJ SOLN
50.0000 ug | Freq: Once | INTRAMUSCULAR | Status: AC
  Administered 2014-09-24: 50 ug via INTRAVENOUS
  Filled 2014-09-24: qty 2

## 2014-09-24 MED ORDER — ISOSORBIDE MONONITRATE ER 60 MG PO TB24
120.0000 mg | ORAL_TABLET | Freq: Every day | ORAL | Status: DC
  Administered 2014-09-24 – 2014-09-28 (×5): 120 mg via ORAL
  Filled 2014-09-24 (×5): qty 2

## 2014-09-24 MED ORDER — ONDANSETRON HCL 4 MG/2ML IJ SOLN
4.0000 mg | Freq: Once | INTRAMUSCULAR | Status: AC
Start: 2014-09-24 — End: 2014-09-24
  Administered 2014-09-24: 4 mg via INTRAVENOUS
  Filled 2014-09-24: qty 2

## 2014-09-24 MED ORDER — INSULIN ASPART 100 UNIT/ML ~~LOC~~ SOLN
0.0000 [IU] | Freq: Three times a day (TID) | SUBCUTANEOUS | Status: DC
  Administered 2014-09-25 (×2): 2 [IU] via SUBCUTANEOUS
  Administered 2014-09-25: 3 [IU] via SUBCUTANEOUS
  Administered 2014-09-26: 5 [IU] via SUBCUTANEOUS
  Administered 2014-09-27: 3 [IU] via SUBCUTANEOUS
  Administered 2014-09-27: 2 [IU] via SUBCUTANEOUS

## 2014-09-24 MED ORDER — SODIUM CHLORIDE 0.9 % IJ SOLN
3.0000 mL | Freq: Two times a day (BID) | INTRAMUSCULAR | Status: DC
  Administered 2014-09-24 – 2014-09-28 (×7): 3 mL via INTRAVENOUS

## 2014-09-24 MED ORDER — FUROSEMIDE 10 MG/ML IJ SOLN
20.0000 mg | Freq: Once | INTRAMUSCULAR | Status: AC
  Administered 2014-09-24: 20 mg via INTRAVENOUS
  Filled 2014-09-24: qty 2

## 2014-09-24 MED ORDER — ASPIRIN 325 MG PO TABS
325.0000 mg | ORAL_TABLET | ORAL | Status: AC
  Administered 2014-09-24: 325 mg via ORAL
  Filled 2014-09-24: qty 1

## 2014-09-24 MED ORDER — HEPARIN SODIUM (PORCINE) 5000 UNIT/ML IJ SOLN
5000.0000 [IU] | Freq: Three times a day (TID) | INTRAMUSCULAR | Status: DC
  Administered 2014-09-24 (×2): 5000 [IU] via SUBCUTANEOUS

## 2014-09-24 MED ORDER — HYDRALAZINE HCL 25 MG PO TABS
25.0000 mg | ORAL_TABLET | Freq: Three times a day (TID) | ORAL | Status: DC
  Administered 2014-09-24 – 2014-09-28 (×10): 25 mg via ORAL
  Filled 2014-09-24 (×13): qty 1

## 2014-09-24 MED ORDER — ACETAMINOPHEN 500 MG PO TABS
1000.0000 mg | ORAL_TABLET | Freq: Three times a day (TID) | ORAL | Status: DC | PRN
  Administered 2014-09-24 – 2014-09-25 (×2): 1000 mg via ORAL
  Filled 2014-09-24 (×2): qty 2

## 2014-09-24 MED ORDER — PIPERACILLIN-TAZOBACTAM IN DEX 2-0.25 GM/50ML IV SOLN
2.2500 g | Freq: Four times a day (QID) | INTRAVENOUS | Status: DC
  Administered 2014-09-24 – 2014-09-25 (×4): 2.25 g via INTRAVENOUS
  Filled 2014-09-24 (×7): qty 50

## 2014-09-24 MED ORDER — DOCUSATE SODIUM 100 MG PO CAPS
100.0000 mg | ORAL_CAPSULE | Freq: Two times a day (BID) | ORAL | Status: DC
  Administered 2014-09-24 – 2014-09-28 (×4): 100 mg via ORAL
  Filled 2014-09-24 (×9): qty 1

## 2014-09-24 MED ORDER — AMLODIPINE BESYLATE 10 MG PO TABS
10.0000 mg | ORAL_TABLET | Freq: Every day | ORAL | Status: DC
  Administered 2014-09-24 – 2014-09-28 (×5): 10 mg via ORAL
  Filled 2014-09-24 (×5): qty 1

## 2014-09-24 MED ORDER — BUDESONIDE 0.25 MG/2ML IN SUSP
2.0000 mL | Freq: Two times a day (BID) | RESPIRATORY_TRACT | Status: DC
  Administered 2014-09-25 – 2014-09-28 (×7): 0.25 mg via RESPIRATORY_TRACT
  Filled 2014-09-24 (×11): qty 2

## 2014-09-24 MED ORDER — ROSUVASTATIN CALCIUM 20 MG PO TABS
20.0000 mg | ORAL_TABLET | Freq: Every day | ORAL | Status: DC
  Administered 2014-09-24 – 2014-09-27 (×4): 20 mg via ORAL
  Filled 2014-09-24 (×5): qty 1

## 2014-09-24 MED ORDER — NITROGLYCERIN 0.4 MG SL SUBL: 0.4000 mg | SUBLINGUAL_TABLET | SUBLINGUAL | Status: DC | PRN

## 2014-09-24 MED ORDER — ONDANSETRON HCL 4 MG/2ML IJ SOLN
4.0000 mg | Freq: Four times a day (QID) | INTRAMUSCULAR | Status: DC | PRN
  Administered 2014-09-27: 4 mg via INTRAVENOUS
  Filled 2014-09-24: qty 2

## 2014-09-24 MED ORDER — CEFTRIAXONE SODIUM 1 G IJ SOLR
1.0000 g | Freq: Once | INTRAMUSCULAR | Status: DC
  Filled 2014-09-24: qty 10

## 2014-09-24 MED ORDER — ASPIRIN EC 81 MG PO TBEC
81.0000 mg | DELAYED_RELEASE_TABLET | Freq: Every day | ORAL | Status: DC
  Administered 2014-09-25 – 2014-09-28 (×4): 81 mg via ORAL
  Filled 2014-09-24 (×5): qty 1

## 2014-09-24 MED ORDER — FLUTICASONE PROPIONATE HFA 44 MCG/ACT IN AERO: 1.0000 | INHALATION_SPRAY | Freq: Two times a day (BID) | RESPIRATORY_TRACT | Status: DC

## 2014-09-24 NOTE — H&P (Signed)
Date: 09/24/2014               Patient Name:  Bianca Bianca [name] MRN: [id number]  DOB: Dec 29, 1930 Age / Sex: 81 y.o., Bianca [name]   PCP: Su Hoff, MD         Medical Service: Internal Medicine Teaching Service         Attending Physician: Dr. Inez Catalina, MD    First Contact: Dr. Allena Katz  Pager: [phone]   Second Contact: Dr. Yetta Barre  Pager: [phone]       After Hours (After 5p/  First Contact Pager: [phone]  weekends / holidays): Second Contact Pager: [phone]   Chief Complaint: Chest pain, abdominal tightness, leg swelling  History of Present Illness: Bianca Bianca [name] is an 81 year old Bianca [name] with CKD Stage 5, CAD status post CABG, CHF, type 2 diabetes, hyperlipidemia, chronic normocytic anemia, asthma, GERD, constipation who presented with chest pain, abdominal tightness, leg swelling. Her daughter-in-law was present at bedside and contributed to the interview using the translator phone.  Around 1 AM this morning, the patient felt sudden onset left-sided chest pain as well as abdominal tightness. The patient reports the pain is not radiating and is worse with palpation as well as inspiration. Preceding the onset of chest pain, daughter-in-law reports that the patient has been developing progressive bilateral leg swelling for the last 2-3 days. Associated with this chest pain is abdominal tightness and 2 episodes of nonbloody emesis since this morning.   She was hospitalized for similar complaints back in February and most recently in April of this year at which time cardiac workup was reassuring. She was last seen by cardiology on 07/03/14 and was advised to continue on salt and fluid restriction given her euvolemic status. At her most recent hospitalization, she was found to have ESBL UTI complicated by urinary retention. Of note, she also reports difficulty urinating and dysuria for the last several days along with subjective fevers though does not know if her weight has changed as she does not weigh  herself daily. She also reports baseline back pain and some shortness of breath for which she uses her inhalers 1-2 times daily. Otherwise, she denies dizziness or recent falls. She lives at home with a lot of family members, and one of her grandkids was recently ill with a common cold. Otherwise, she denies any prior history of tobacco or alcohol use.  In the emergency department, she was given aspirin 325 mg and Lasix 20 mg IV. Initial troponin was found to be negative.    Meds: Current Facility-Administered Medications  Medication Dose Route Frequency Provider Last Rate Last Dose  . aspirin tablet 325 mg  325 mg Oral STAT Purvis Sheffield, MD      . fentaNYL (SUBLIMAZE) injection 50 mcg  50 mcg Intravenous Once Purvis Sheffield, MD      . ondansetron [facility]) injection 4 mg  4 mg Intravenous Once Purvis Sheffield, MD       Current Outpatient Prescriptions  Medication Sig Dispense Refill  . acetaminophen (TYLENOL) 500 MG tablet Take 2 tablets (1,000 mg total) by mouth every 8 (eight) hours as needed for mild pain. 100 tablet 2  . amLODipine (NORVASC) 10 MG tablet Take 1 tablet (10 mg total) by mouth daily. 90 tablet 1  . aspirin EC 81 MG tablet TAKE 1 TABLET BY MOUTH EVERY DAY 150 tablet 1  . B-D ULTRAFINE III SHORT PEN 31G X 8 MM MISC USE AS DIRECTED TO INJECT INSULIN 3 TIMES  A DAY 100 each 6  . BAYER MICROLET LANCETS lancets Use as instructed 200 each 5  . carvedilol (COREG) 25 MG tablet TAKE 1 TABLET (25 MG TOTAL) BY MOUTH 2 (TWO) TIMES DAILY WITH A MEAL. 120 tablet 3  . CRESTOR 20 MG tablet TAKE 1 TABLET (20 MG TOTAL) BY MOUTH DAILY. 90 tablet 3  . docusate sodium (COLACE) 100 MG capsule Take 100 mg by mouth 2 (two) times daily.    . fluticasone (FLOVENT HFA) 44 MCG/ACT inhaler Inhale 1 puff into the lungs 2 (two) times daily. 1 Inhaler 12  . glucose blood (BAYER CONTOUR NEXT TEST) test strip 1 each by Other route as needed for other. Use 4 to 5 times daily to check blood sugar. diag  code E11.9. Insulin dependent 200 each 5  . hydrALAZINE (APRESOLINE) 25 MG tablet Take 1 tablet (25 mg total) by mouth 3 (three) times daily. 270 tablet 5  . HYDROcodone-acetaminophen (NORCO/VICODIN) 5-325 MG per tablet Take 2 tablets by mouth every 4 (four) hours as needed. 12 tablet 0  . insulin aspart (NOVOLOG FLEXPEN) 100 UNIT/ML FlexPen Please take 8 units with breakfast, 8 units with lunch, and 10 units with dinner/supper 100 mL 9  . insulin glargine (LANTUS) 100 UNIT/ML injection Inject 0.05 mLs (5 Units total) into the skin at bedtime. 10 mL 5  . isosorbide mononitrate (IMDUR) 120 MG 24 hr tablet Take 120 mg by mouth daily.  1  . nitroGLYCERIN (NITROSTAT) 0.4 MG SL tablet Place 0.4 mg under the tongue every 5 (five) minutes as needed. For chest pain. Maximum of 3 doses    . omeprazole (PRILOSEC) 20 MG capsule TAKE 1 CAPSULE (20 MG TOTAL) BY MOUTH DAILY. 30 capsule 0  . polyethylene glycol (MIRALAX) packet Take 17 g by mouth 3 (three) times daily. 100 each 0  . PROAIR HFA 108 (90 BASE) MCG/ACT inhaler INHALE 1 PUFF EVERY 6 HOURS AS NEEDED FOR WHEEZE 8.5 each 2  . Pseudoeph-Doxylamine-DM-APAP (NYQUIL PO) Take 1 Dose by mouth once.    . calcium citrate-vitamin D (CITRACAL+D) 315-200 MG-UNIT per tablet Take 2 tablets by mouth 2 (two) times daily. (Patient not taking: Reported on 08/05/2014) 100 tablet 3  . diclofenac sodium (VOLTAREN) 1 % GEL Apply 2 g topically 4 (four) times daily. (Patient not taking: Reported on 08/23/2014) 100 g 0  . sennosides-docusate sodium (SENOKOT-S) 8.6-50 MG tablet Take 1 tablet by mouth daily. (Patient not taking: Reported on 08/05/2014) 90 tablet 3    Allergies: Allergies as of 09/24/2014 - Review Complete 09/24/2014  Allergen Reaction Noted  . Cozaar Other (See Comments) 02/12/2011  . Lisinopril  05/24/2012   Past Medical History  Diagnosis Date  . GERD (gastroesophageal reflux disease)   . Hepatitis B   . Hyperlipemia   . Hypertension   . Cervical dysplasia    . Atrophic vaginitis   . Spinal stenosis     s/p decompression  . CAD (coronary artery disease) April 2008    s/p CABG;    cath 8/12:  LM patent, pLAD occluded, OM1 30-40%, pOM2 80% and 60% after the anastomosis, pRCA occluded, S-DX occluded (old), S-OM 2 occluded (new), L-LAD patent, dLAD provided collats to the PDA; Med Rx  rec.; consider PCI to OM2 if fails med Rx  . Hx of hysterectomy     daughter, Rocky Crafts, denies this hx on 08/23/2014  . UTI (lower urinary tract infection)   . Diverticulosis of intestine 04/29/2014    Of ascending colon  noted on CT abd/ pelvis 04/20/14  . Diabetes mellitus, type 2     insulin dependent  . COPD (chronic obstructive pulmonary disease)     Hattie Perch 08/23/2014  . Chronic kidney disease (CKD), stage IV (severe)     Hattie Perch 08/23/2014  . CHF (congestive heart failure)     Hattie Perch 08/23/2014  . Chronic lower back pain    Past Surgical History  Procedure Laterality Date  . Esophagogastroduodenoscopy Left 04/22/2014    Procedure: ESOPHAGOGASTRODUODENOSCOPY (EGD);  Surgeon: Shirley Friar, MD;  Location[facility] END[facility]COPY;  Service: Endoscopy;  Laterality: Left;  . Total abdominal hysterectomy      cervical dysplasia; daughter, Rocky Crafts denies this hx on 08/23/2014  . Back surgery    . Lumbar laminectomy/decompression microdiscectomy  02/2000    Central and lateral decompression L3-4, L4-5, L5-S1./notes 09/15/2010  . Coronary artery bypass graft  April 2008    "CABG X5"  . Cardiac catheterization  08/2006; 09/2006; 2011,  12/2010    Hattie Perch 5/15/2012Marland Kitchen Hattie Perch 5/2/2012Marland Kitchen Hattie Perch 09/14/2010   History reviewed. No pertinent family history. History   Social History  . Marital Status: Widowed    Spouse Name: N/A  . Number of Children: N/A  . Years of Education: N/A   Occupational History  . Not on file.   Social History Main Topics  . Smoking status: Never Smoker   . Smokeless tobacco: Never Used  . Alcohol Use: No  . Drug Use: No  . Sexual Activity: Not on file    Other Topics Concern  . Not on file   Social History Narrative   Chinese immigrant   Housewife   Widowed   Lives w/ her son    Review of Systems: As noted in the history of present illness  Physical Exam: Blood pressure 139/54, pulse 79, temperature 98.1 F (36.7 C), temperature source Oral, resp. rate 19, SpO2 98 %. General: Elderly Bianca Bianca [name], resting in bed, NAD HEENT: PERRL, EOMI, no scleral icterus, oropharynx with dry mucous membranes Cardiac: RRR, no rubs, murmurs or gallops Pulm: Bibasilar crackles in the posterior lung fields Abd: soft, nontender, mildly distended, BS present Ext: warm and well perfused, 2+ pitting tibial edema  [right greater than left]  Neuro: CN II-XII intact, 5/5 upper and lower extremity strength, 2+ grip strength    Lab results: Basic Metabolic Panel:  Recent Labs  16/10/96 1101  NA 138  K 4.6  CL 110  CO2 18*  GLUCOSE 239*  BUN 42*  CREATININE 3.26*  CALCIUM 8.8*   Liver Function Tests:  Recent Labs  09/24/14 1101  AST 27  ALT 33  ALKPHOS 71  BILITOT 0.6  PROT 6.5  ALBUMIN 3.4*   CBC:  Recent Labs  09/24/14 1101  WBC 8.6  HGB 8.2*  HCT 25.2*  MCV 91.3  PLT 118*    Imaging results:  Dg Chest 2 View  09/24/2014   CLINICAL DATA:  Chest pain.  EXAM: CHEST  2 VIEW  COMPARISON:  August 23, 2014.  FINDINGS: Stable cardiomegaly. Status post coronary artery bypass graft. No pneumothorax or significant pleural effusion is noted. Mild central pulmonary vascular congestion is noted which is increased compared to prior exam. Probable minimal bilateral pulmonary edema is noted.  IMPRESSION: Mild central pulmonary vascular congestion is noted which is increased compared to prior exam. Minimal to mild bilateral pulmonary edema is noted as well.   Electronically Signed   By: Lupita Raider, M.D.   On: 09/24/2014 11:42  Other results: EKG: Reviewed and compared with 08/23/14 Normal rate Normal axis T-wave flattening in  lead 3, stable from prior  Assessment & Plan by Problem:  Bianca Bianca [name] is an [age] year old Bianca [name] with CKD Stage 5, CAD status post CABG, CHF, type 2 diabetes, hyperlipidemia, chronic normocytic anemia, asthma, GERD, constipation who presented with fluid overload and atypical chest pain.  Fluid overload: Consistent with physical exam findings along with chest x-ray findings of pulmonary edema. Possible causes include worsening renal dysfunction as well as acute CHF exacerbation, both of which can have an elevated BNP. Her chest pain would raise concern for an acute cardiac event though EKG, initial troponin have been reassuring, and the features of her chest pain appears atypical in nature given pain worse with palpation. She does report orthopnea and progressive bilateral lower extremity swelling which can be seen with worsening renal disease. Her abdominal tightness could be a consequence of fluid overload though frank ascites was not noted on abdominal exam. LFTs on admission are reassuring for no liver dysfunction. She produced urine with Lasix 20 mg IV which is reassuring. -Give another Lasix 20 mg IV after speaking with nephrology -Consult nephrology for further management as an outpatient -Repeat BMET tomorrow morning -Monitor ins and outs -Continue daily weights  Atypical chest pain: As noted above. Pleuritic causes of her pain are suggestive by the association with inspiration though chest x-ray are not supportive. DVT/PE possible given lower extremity edema though appear symmetric bilaterally, and it did not appear that she had any risk factors, like recent surgery, immobility. -Monitor on telemetry -Check troponins x 3 -Repeat EKG in AM -Continue nitroglycerin sublingual prn for chest pain  UTI with history of urinary retention: UA notable for cloudy appearance, trace leukocytes, many bacteria. She also reports symptoms consistent with an infection. -Start Zosyn given concern for ESBL  infection [antibiotics day 1] -Follow-up urine culture to narrow sensitivity -Check bladder scan  CKD Stage V complicated by non-gap metabolic acidosis: Creatinine 3.3 on admission, baseline mostly 3-3.5 over this past year though has been steadily uptrending. Bicarbonate 18 on admission which appears to be at her baseline since December 2015. She was last seen by her nephrologist, Dr. Lowell GuitarPowell, on 07/31/14 with follow-up in 4 months. Renal ultrasound last month was otherwise reassuring notable only for bilateral renal cysts. -Consider starting her on bicarbonate therapy -Repeat BMET tomorrow morning -Check PTH, vitamin D  CHF: Echo 04/30/14 notable for EF 40-45% with mild LVH, mild focal basal hypertrophy of the septum. She also is a history of CABG. Her medications include Imdur 120 mg daily, Coreg 25 mg twice daily, aspirin 81 mg, nitroglycerin 0.4 mg every 5 minutes as needed for chest pain. -Continue home medications  Acute hepatitis B infection: Per chart review, appears that she had a healthcare exposure to HBV after hospitalization in April. Labs checked at office visit 5/10 notable for reactive HBV core antibody, elevated HBV DNA, positive HBVe antibody, unreactive HBV surface antibody which is suggestive of acute infection. LFTs reassuring on admission. -Refer for outpatient follow-up with infectious diseases  Type 2 diabetes: Glucose 239 on admission. A1c 6.9 when checked earlier this month though has been as high as 9.3 in February 2015. Her medications include Lantus 5 units at bedtime, NovoLog 8 units with breakfast, 8 units with lunch, 10 units with dinner. -Continue SSI-S  Hypertension: Home medications include Coreg 25 mg, hydralazine 25 mg 3 times daily, amlodipine 10 mg. -Continue home medications  Hyperlipidemia: Most recent lipid panel  in February 2015 notable for total cholesterol 218, HDL 65, LDL 106, triglycerides 235. Her medications include Crestor 20 mg. -Continue home  medications  Chronic normocytic anemia: Hemoglobin 8.2 on admission, baseline 8-10 since December 2015. Anemia panel 08/24/14 notable for low normal iron 45, normal ferritin 158, low TIBC 200 suggestive of anemia of chronic disease. -Check CBC tomorrow morning  Asthma: Her medications include albuterol 1 puff every 6 hours as needed for wheezing, Flovent 1 puff twice daily. -Continue home medications  GERD: Home medications include Prilosec 20 mg. -Continue Protonix 40 mg daily  Constipation: Continue MiraLAX 3 times daily.  #FEN:  -Diet: Renal with fluid restriction  #DVT prophylaxis: Heparin   #CODE STATUS: FULL CODE -Defer to Mia if patients lacks decision-making capacity -Confirmed with patient on admission  Dispo: Disposition is deferred at this time, awaiting improvement of current medical problems.   The patient does have a current PCP (Carly Arlyce Harman, MD) and does need an [facility] hospital follow-up appointment after discharge.  The patient does have transportation limitations that hinder transportation to clinic appointments.  Signed: [name], MD 09/24/2014, 1:23 PM

## 2014-09-24 NOTE — ED Provider Notes (Signed)
CSN: [id number]     Arrival date & time 09/24/14  1032 History   First MD Initiated Contact with Patient 09/24/14 1119     Chief Complaint  Patient presents with  . Chest Pain     (Consider location/radiation/quality/duration/timing/severity/associated sxs/prior Treatment) Patient is a 81 y.o. female presenting with chest pain. The history is provided by the patient. The history is limited by a language barrier. A language interpreter was used.  Chest Pain Pain location:  Substernal area Pain quality: tightness   Pain radiates to:  Does not radiate Pain radiates to the back: no   Pain severity:  Moderate Onset quality:  Sudden Duration:  10 hours Timing:  Constant Progression:  Waxing and waning Chronicity:  New Context: at rest   Relieved by:  Nothing Worsened by:  Nothing tried Ineffective treatments:  None tried Associated symptoms: cough, nausea, shortness of breath and vomiting   Associated symptoms: no abdominal pain, no back pain, no dizziness, no fatigue, no fever and no headache     Past Medical History  Diagnosis Date  . GERD (gastroesophageal reflux disease)   . Hepatitis B   . Hyperlipemia   . Hypertension   . Cervical dysplasia   . Atrophic vaginitis   . Spinal stenosis     s/p decompression  . CAD (coronary artery disease) April 2008    s/p CABG;    cath 8/12:  LM patent, pLAD occluded, OM1 30-40%, pOM2 80% and 60% after the anastomosis, pRCA occluded, S-DX occluded (old), S-OM 2 occluded (new), L-LAD patent, dLAD provided collats to the PDA; Med Rx  rec.; consider PCI to OM2 if fails med Rx  . Hx of hysterectomy     daughter, Rocky Crafts, denies this hx on 08/23/2014  . UTI (lower urinary tract infection)   . Diverticulosis of intestine 04/29/2014    Of ascending colon noted on CT abd/ pelvis 04/20/14  . Diabetes mellitus, type 2     insulin dependent  . COPD (chronic obstructive pulmonary disease)     Hattie Perch 08/23/2014  . Chronic kidney disease (CKD), stage IV  (severe)     Hattie Perch 08/23/2014  . CHF (congestive heart failure)     Hattie Perch 08/23/2014  . Chronic lower back pain    Past Surgical History  Procedure Laterality Date  . Esophagogastroduodenoscopy Left 04/22/2014    Procedure: ESOPHAGOGASTRODUODENOSCOPY (EGD);  Surgeon: Shirley Friar, MD;  Location: [facility] END[facility]COPY;  Service: Endoscopy;  Laterality: Left;  . Total abdominal hysterectomy      cervical dysplasia; daughter, Rocky Crafts denies this hx on 08/23/2014  . Back surgery    . Lumbar laminectomy/decompression microdiscectomy  02/2000    Central and lateral decompression L3-4, L4-5, L5-S1./notes 09/15/2010  . Coronary artery bypass graft  April 2008    "CABG X5"  . Cardiac catheterization  08/2006; 09/2006; 2011,  12/2010    Hattie Perch 5/15/2012Marland Kitchen Hattie Perch 5/2/2012Marland Kitchen Hattie Perch 09/14/2010   History reviewed. No pertinent family history. History  Substance Use Topics  . Smoking status: Never Smoker   . Smokeless tobacco: Never Used  . Alcohol Use: No   OB History    No data available     Review of Systems  Constitutional: Negative for fever and fatigue.  HENT: Negative for congestion and drooling.   Eyes: Negative for pain.  Respiratory: Positive for cough and shortness of breath.   Cardiovascular: Negative for chest pain.  Gastrointestinal: Positive for nausea and vomiting. Negative for abdominal pain and diarrhea.  Genitourinary: Negative for  dysuria and hematuria.  Musculoskeletal: Negative for back pain, gait problem and neck pain.  Skin: Negative for color change.  Neurological: Negative for dizziness and headaches.  Hematological: Negative for adenopathy.  Psychiatric/Behavioral: Negative for behavioral problems.  All other systems reviewed and are negative.     Allergies  Cozaar and Lisinopril  Home Medications   Prior to Admission medications   Medication Sig Start Date End Date Taking? Authorizing Provider  acetaminophen (TYLENOL) 500 MG tablet Take 2 tablets (1,000 mg total)  by mouth every 8 (eight) hours as needed for mild pain. 12/25/13 12/25/14  Su Hoffarly J Rivet, MD  amLODipine (NORVASC) 10 MG tablet Take 1 tablet (10 mg total) by mouth daily. 08/14/14   Gust RungErik C Hoffman, DO  aspirin EC 81 MG tablet TAKE 1 TABLET BY MOUTH EVERY DAY 01/03/13   Lorretta HarpXilin Niu, MD  B-D ULTRAFINE III SHORT PEN 31G X 8 MM MISC USE AS DIRECTED TO INJECT INSULIN 3 TIMES A DAY 03/12/14   Su Hoffarly J Rivet, MD  BAYER MICROLET LANCETS lancets Use as instructed 08/14/14   Gust RungErik C Hoffman, DO  calcium citrate-vitamin D (CITRACAL+D) 315-200 MG-UNIT per tablet Take 2 tablets by mouth 2 (two) times daily. Patient not taking: Reported on 08/05/2014 12/10/13   Iris Pertarly J Rivet, MD  carvedilol (COREG) 25 MG tablet TAKE 1 TABLET (25 MG TOTAL) BY MOUTH 2 (TWO) TIMES DAILY WITH A MEAL. 04/23/14   Carly J Rivet, MD  CRESTOR 20 MG tablet TAKE 1 TABLET (20 MG TOTAL) BY MOUTH DAILY. 11/22/13   Levert FeinsteinJames M Granfortuna, MD  diclofenac sodium (VOLTAREN) 1 % GEL Apply 2 g topically 4 (four) times daily. Patient not taking: Reported on 08/23/2014 06/25/14   Iris Pertarly J Rivet, MD  docusate sodium (COLACE) 100 MG capsule Take 100 mg by mouth 2 (two) times daily.    Historical Provider, MD  fluticasone (FLOVENT HFA) 44 MCG/ACT inhaler Inhale 1 puff into the lungs 2 (two) times daily. 06/19/14   Harold BarbanLawrence Ngo, MD  glucose blood (BAYER CONTOUR NEXT TEST) test strip 1 each by Other route as needed for other. Use 4 to 5 times daily to check blood sugar. diag code E11.9. Insulin dependent 08/14/14   Gust RungErik C Hoffman, DO  hydrALAZINE (APRESOLINE) 25 MG tablet Take 1 tablet (25 mg total) by mouth 3 (three) times daily. 06/11/13   Lorretta HarpXilin Niu, MD  HYDROcodone-acetaminophen (NORCO/VICODIN) 5-325 MG per tablet Take 2 tablets by mouth every 4 (four) hours as needed. 08/30/14   Kaitlyn Szekalski, PA-C  insulin aspart (NOVOLOG FLEXPEN) 100 UNIT/ML FlexPen Please take 8 units with breakfast, 8 units with lunch, and 10 units with dinner/supper 06/19/14   Harold BarbanLawrence Ngo, MD   insulin glargine (LANTUS) 100 UNIT/ML injection Inject 0.05 mLs (5 Units total) into the skin at bedtime. 08/27/14   Harold BarbanLawrence Ngo, MD  nitroGLYCERIN (NITROSTAT) 0.4 MG SL tablet Place 0.4 mg under the tongue every 5 (five) minutes as needed. For chest pain. Maximum of 3 doses    Historical Provider, MD  omeprazole (PRILOSEC) 20 MG capsule TAKE 1 CAPSULE (20 MG TOTAL) BY MOUTH DAILY. 07/30/14   Carly Arlyce HarmanJ Rivet, MD  polyethylene glycol (MIRALAX) packet Take 17 g by mouth 3 (three) times daily. 09/03/14   Carolan ClinesNora M Sadek, MD  PROAIR HFA 108 (90 BASE) MCG/ACT inhaler INHALE 1 PUFF EVERY 6 HOURS AS NEEDED FOR WHEEZE 04/05/14   Su Hoffarly J Rivet, MD  sennosides-docusate sodium (SENOKOT-S) 8.6-50 MG tablet Take 1 tablet by mouth daily. Patient not  taking: Reported on 08/05/2014 04/11/12   Lorretta Harp, MD   BP 174/75 mmHg  Pulse 81  Temp(Src) 98.1 F (36.7 C) (Oral)  Resp 24  SpO2 98% Physical Exam  Constitutional: She is oriented to person, place, and time. She appears well-developed and well-nourished.  HENT:  Head: Normocephalic.  Mouth/Throat: Oropharynx is clear and moist. No oropharyngeal exudate.  Eyes: Conjunctivae and EOM are normal. Pupils are equal, round, and reactive to light.  Neck: Normal range of motion. Neck supple.  Cardiovascular: Normal rate, regular rhythm, normal heart sounds and intact distal pulses.  Exam reveals no gallop and no friction rub.   No murmur heard. Pulmonary/Chest: Breath sounds normal. She is in respiratory distress (mild tachypnea). She has no wheezes.  Abdominal: Soft. Bowel sounds are normal. There is no tenderness. There is no rebound and no guarding.  Musculoskeletal: Normal range of motion. She exhibits no edema or tenderness.  Neurological: She is alert and oriented to person, place, and time.  Skin: Skin is warm and dry.  Psychiatric: She has a normal mood and affect. Her behavior is normal.  Nursing note and vitals reviewed.   ED Course  Procedures (including  critical care time) Labs Review Labs Reviewed  CBC - Abnormal; Notable for the following:    RBC 2.76 (*)    Hemoglobin 8.2 (*)    HCT 25.2 (*)    All other components within normal limits  COMPREHENSIVE METABOLIC PANEL - Abnormal; Notable for the following:    CO2 18 (*)    Glucose, Bld 239 (*)    BUN 42 (*)    Creatinine, Ser 3.26 (*)    Calcium 8.8 (*)    Albumin 3.4 (*)    GFR calc non Af Amer 12 (*)    GFR calc Af Amer 14 (*)    All other components within normal limits  BRAIN NATRIURETIC PEPTIDE  I-STAT TROPOININ, ED    Imaging Review Dg Chest 2 View  09/24/2014   CLINICAL DATA:  Chest pain.  EXAM: CHEST  2 VIEW  COMPARISON:  August 23, 2014.  FINDINGS: Stable cardiomegaly. Status post coronary artery bypass graft. No pneumothorax or significant pleural effusion is noted. Mild central pulmonary vascular congestion is noted which is increased compared to prior exam. Probable minimal bilateral pulmonary edema is noted.  IMPRESSION: Mild central pulmonary vascular congestion is noted which is increased compared to prior exam. Minimal to mild bilateral pulmonary edema is noted as well.   Electronically Signed   By: Lupita Raider, M.D.   On: 09/24/2014 11:42     EKG Interpretation   Date/Time:  Tuesday Sep 24 2014 10:42:14 EDT Ventricular Rate:  81 PR Interval:  192 QRS Duration: 80 QT Interval:  382 QTC Calculation: 443 R Axis:   101 Text Interpretation:  Normal sinus rhythm Rightward axis Cannot rule out  Inferior infarct , age undetermined No significant change since last  tracing Confirmed by Chonda Baney  MD, Abagael Kramm ([id number]) on 09/24/2014 1:49:49 PM      MDM   Final diagnoses:  Other chest pain  SOB (shortness of breath)  CHF exacerbation    12:00 PM 81 y.o. female w hx of hep B, HTN, CAD s/p CABG, COPD, CHF who presents with chest tightness, shortness of breath, nausea, and vomiting which began last night around 1 AM. She's had 2 episodes of emesis. She denies any  diarrhea. She continues to feel short of breath. Vital signs unremarkable here. Plan for screening  labs and imaging.  The patient will be admitted to IM teaching (resident pt). Any medications given in the ED during this visit are listed below:  Medications  furosemide (LASIX) injection 20 mg (20 mg Intravenous Given 09/24/14 1225)  aspirin tablet 325 mg (325 mg Oral Given 09/24/14 1326)  fentaNYL (SUBLIMAZE) injection 50 mcg (50 mcg Intravenous Given 09/24/14 1326)  ondansetron (ZOFRAN) injection 4 mg (4 mg Intravenous Given 09/24/14 1326)       Purvis Sheffield, MD 09/24/14 1350

## 2014-09-24 NOTE — ED Notes (Signed)
Pt here with c/o chest pain that started around 1 am , pt is radiating to her back

## 2014-09-24 NOTE — Progress Notes (Addendum)
ANTIBIOTIC CONSULT NOTE - INITIAL  Pharmacy Consult for zosyn Indication: UTI  Allergies  Allergen Reactions  . Cozaar Other (See Comments)    Sig increase in her creatinine that resolved once it was D/C'd  Per Cr visible in epic, it seems her baseline Cr = 1.7-1.9, and the slight improvement to Cr was an outlier when compared to recent Cr.  We will do a trial of Lisinopril (and monitor for 30% rise in Cr), as she would greatly benefit from renal protection.  . Lisinopril     Cough     Patient Measurements: Weight: 109 lb 2 oz (49.5 kg) (Per previous admission) Adjusted Body Weight:   Vital Signs: Temp: 98.1 F (36.7 C) (05/24 1054) Temp Source: Oral (05/24 1054) BP: 161/78 mmHg (05/24 1500) Pulse Rate: 79 (05/24 1500) Intake/Output from previous day:   Intake/Output from this shift:    Labs:  Recent Labs  09/24/14 1101  WBC 8.6  HGB 8.2*  PLT 118*  CREATININE 3.26*   Estimated Creatinine Clearance: 9.2 mL/min (by C-G formula based on Cr of 3.26). No results for input(s): VANCOTROUGH, VANCOPEAK, VANCORANDOM, GENTTROUGH, GENTPEAK, GENTRANDOM, TOBRATROUGH, TOBRAPEAK, TOBRARND, AMIKACINPEAK, AMIKACINTROU, AMIKACIN in the last 72 hours.   Microbiology: No results found for this or any previous visit (from the past 720 hour(s)).  Medical History: Past Medical History  Diagnosis Date  . GERD (gastroesophageal reflux disease)   . Hepatitis B   . Hyperlipemia   . Hypertension   . Cervical dysplasia   . Atrophic vaginitis   . Spinal stenosis     s/p decompression  . CAD (coronary artery disease) April 2008    s/p CABG;    cath 8/12:  LM patent, pLAD occluded, OM1 30-40%, pOM2 80% and 60% after the anastomosis, pRCA occluded, S-DX occluded (old), S-OM 2 occluded (new), L-LAD patent, dLAD provided collats to the PDA; Med Rx  rec.; consider PCI to OM2 if fails med Rx  . Hx of hysterectomy     daughter, [name], denies this hx on 08/23/2014  . UTI (lower urinary tract  infection)   . Diverticulosis of intestine 04/29/2014    Of ascending colon noted on CT abd/ pelvis 04/20/14  . Diabetes mellitus, type 2     insulin dependent  . COPD (chronic obstructive pulmonary disease)     Hattie Perch/notes 08/23/2014  . Chronic kidney disease (CKD), stage IV (severe)     Hattie Perch/notes 08/23/2014  . CHF (congestive heart failure)     Hattie Perch/notes 08/23/2014  . Chronic lower back pain     Medications:  Anti-infectives    Start     Dose/Rate Route Frequency Ordered Stop   09/24/14 1600  piperacillin-tazobactam (ZOSYN) IVPB 2.25 g     2.25 g 100 mL/hr over 30 Minutes Intravenous Every 6 hours 09/24/14 1533     09/24/14 1400  cefTRIAXone (ROCEPHIN) 1 g in dextrose 5 % 50 mL IVPB  Status:  Discontinued     1 g 100 mL/hr over 30 Minutes Intravenous  Once 09/24/14 1356 09/24/14 1503     Assessment: 83 yof presented to the ED with CP, N/V. To start broad-spectrum zosyn for coverage of UTI. Pt is afebrile and WBC is WNL. Scr is elevated at 3.26 but known history of CKD.   Zosyn 5/24>>  Goal of Therapy:  Eradication of infeciton  Plan:  - Zosyn 2.25gm IV Q6H  - F/u renal fxn, C&S, clinical status   Rumbarger, Drake Leachachel Lynn 09/24/2014,3:34 PM   Pharmacy  to sign off -- will follow peripherally for changes in renal function. Thank you. Okey Regal, PharmD 5/25

## 2014-09-25 ENCOUNTER — Other Ambulatory Visit (HOSPITAL_COMMUNITY): Payer: Medicare Other

## 2014-09-25 ENCOUNTER — Inpatient Hospital Stay (HOSPITAL_COMMUNITY): Payer: Medicare Other

## 2014-09-25 ENCOUNTER — Encounter (HOSPITAL_COMMUNITY): Payer: Self-pay | Admitting: General Practice

## 2014-09-25 ENCOUNTER — Encounter: Payer: Self-pay | Admitting: *Deleted

## 2014-09-25 DIAGNOSIS — Z87440 Personal history of urinary (tract) infections: Secondary | ICD-10-CM

## 2014-09-25 DIAGNOSIS — Z7982 Long term (current) use of aspirin: Secondary | ICD-10-CM

## 2014-09-25 DIAGNOSIS — E1122 Type 2 diabetes mellitus with diabetic chronic kidney disease: Secondary | ICD-10-CM

## 2014-09-25 DIAGNOSIS — B9689 Other specified bacterial agents as the cause of diseases classified elsewhere: Secondary | ICD-10-CM

## 2014-09-25 DIAGNOSIS — Z8619 Personal history of other infectious and parasitic diseases: Secondary | ICD-10-CM

## 2014-09-25 DIAGNOSIS — R0789 Other chest pain: Secondary | ICD-10-CM

## 2014-09-25 DIAGNOSIS — J810 Acute pulmonary edema: Secondary | ICD-10-CM

## 2014-09-25 DIAGNOSIS — I132 Hypertensive heart and chronic kidney disease with heart failure and with stage 5 chronic kidney disease, or end stage renal disease: Secondary | ICD-10-CM

## 2014-09-25 DIAGNOSIS — N390 Urinary tract infection, site not specified: Secondary | ICD-10-CM

## 2014-09-25 DIAGNOSIS — Z79899 Other long term (current) drug therapy: Secondary | ICD-10-CM

## 2014-09-25 DIAGNOSIS — K219 Gastro-esophageal reflux disease without esophagitis: Secondary | ICD-10-CM

## 2014-09-25 DIAGNOSIS — Z7951 Long term (current) use of inhaled steroids: Secondary | ICD-10-CM

## 2014-09-25 DIAGNOSIS — B169 Acute hepatitis B without delta-agent and without hepatic coma: Secondary | ICD-10-CM

## 2014-09-25 DIAGNOSIS — Z794 Long term (current) use of insulin: Secondary | ICD-10-CM

## 2014-09-25 DIAGNOSIS — E8770 Fluid overload, unspecified: Secondary | ICD-10-CM

## 2014-09-25 DIAGNOSIS — I10 Essential (primary) hypertension: Secondary | ICD-10-CM

## 2014-09-25 DIAGNOSIS — J45909 Unspecified asthma, uncomplicated: Secondary | ICD-10-CM

## 2014-09-25 DIAGNOSIS — D649 Anemia, unspecified: Secondary | ICD-10-CM

## 2014-09-25 DIAGNOSIS — K5900 Constipation, unspecified: Secondary | ICD-10-CM

## 2014-09-25 DIAGNOSIS — I200 Unstable angina: Secondary | ICD-10-CM

## 2014-09-25 DIAGNOSIS — E785 Hyperlipidemia, unspecified: Secondary | ICD-10-CM

## 2014-09-25 LAB — CBC
HCT: 20.9 % — ABNORMAL LOW (ref 36.0–46.0)
HCT: 21.6 % — ABNORMAL LOW (ref 36.0–46.0)
HEMATOCRIT: 25.1 % — AB (ref 36.0–46.0)
HEMOGLOBIN: 8.6 g/dL — AB (ref 12.0–15.0)
Hemoglobin: 6.9 g/dL — CL (ref 12.0–15.0)
Hemoglobin: 6.9 g/dL — CL (ref 12.0–15.0)
MCH: 30.3 pg (ref 26.0–34.0)
MCH: 29.4 pg (ref 26.0–34.0)
MCH: 30.7 pg (ref 26.0–34.0)
MCHC: 33 g/dL (ref 30.0–36.0)
MCHC: 31.9 g/dL (ref 30.0–36.0)
MCHC: 34.3 g/dL (ref 30.0–36.0)
MCV: 91.7 fL (ref 78.0–100.0)
MCV: 91.9 fL (ref 78.0–100.0)
MCV: 89.6 fL (ref 78.0–100.0)
PLATELETS: 121 10*3/uL — AB (ref 150–400)
PLATELETS: 101 10*3/uL — AB (ref 150–400)
Platelets: 124 10*3/uL — ABNORMAL LOW (ref 150–400)
RBC: 2.28 MIL/uL — ABNORMAL LOW (ref 3.87–5.11)
RBC: 2.35 MIL/uL — AB (ref 3.87–5.11)
RBC: 2.8 MIL/uL — ABNORMAL LOW (ref 3.87–5.11)
RDW: 14.2 % (ref 11.5–15.5)
RDW: 14.4 % (ref 11.5–15.5)
RDW: 14.7 % (ref 11.5–15.5)
WBC: 8 10*3/uL (ref 4.0–10.5)
WBC: 7.7 10*3/uL (ref 4.0–10.5)
WBC: 6.5 10*3/uL (ref 4.0–10.5)

## 2014-09-25 LAB — BASIC METABOLIC PANEL
Anion gap: 12 (ref 5–15)
BUN: 41 mg/dL — AB (ref 6–20)
CO2: 18 mmol/L — AB (ref 22–32)
Calcium: 8.6 mg/dL — ABNORMAL LOW (ref 8.9–10.3)
Chloride: 108 mmol/L (ref 101–111)
Creatinine, Ser: 3.47 mg/dL — ABNORMAL HIGH (ref 0.44–1.00)
GFR, EST AFRICAN AMERICAN: 13 mL/min — AB (ref >60)
GFR, EST NON AFRICAN AMERICAN: 11 mL/min — AB (ref >60)
GLUCOSE: 153 mg/dL — AB (ref 65–99)
Potassium: 4.4 mmol/L (ref 3.5–5.1)
Sodium: 138 mmol/L (ref 135–145)

## 2014-09-25 LAB — GLUCOSE, CAPILLARY
GLUCOSE-CAPILLARY: 211 mg/dL — AB (ref 65–99)
Glucose-Capillary: 169 mg/dL — ABNORMAL HIGH (ref 65–99)
Glucose-Capillary: 184 mg/dL — ABNORMAL HIGH (ref 65–99)
Glucose-Capillary: 101 mg/dL — ABNORMAL HIGH (ref 65–99)

## 2014-09-25 LAB — MAGNESIUM: MAGNESIUM: 2.1 mg/dL (ref 1.7–2.4)

## 2014-09-25 LAB — TROPONIN I: Troponin I: <0.03 ng/mL (ref <0.031)

## 2014-09-25 LAB — PREPARE RBC (CROSSMATCH)

## 2014-09-25 LAB — OCCULT BLOOD X 1 CARD TO LAB, STOOL: Fecal Occult Bld: NEGATIVE

## 2014-09-25 MED ORDER — IPRATROPIUM-ALBUTEROL 0.5-2.5 (3) MG/3ML IN SOLN: 3.0000 mL | Freq: Four times a day (QID) | RESPIRATORY_TRACT | Status: DC | PRN

## 2014-09-25 MED ORDER — LEVALBUTEROL TARTRATE 45 MCG/ACT IN AERO: 1.0000 | INHALATION_SPRAY | Freq: Four times a day (QID) | RESPIRATORY_TRACT | Status: DC | PRN

## 2014-09-25 MED ORDER — SODIUM CHLORIDE 0.9 % IV SOLN
Freq: Once | INTRAVENOUS | Status: AC
  Administered 2014-09-25: 10:00:00 via INTRAVENOUS

## 2014-09-25 MED ORDER — GUAIFENESIN-DM 100-10 MG/5ML PO SYRP
5.0000 mL | ORAL_SOLUTION | ORAL | Status: DC | PRN
  Administered 2014-09-27 – 2014-09-28 (×2): 5 mL via ORAL
  Filled 2014-09-25 (×2): qty 5

## 2014-09-25 MED ORDER — PIPERACILLIN-TAZOBACTAM IN DEX 2-0.25 GM/50ML IV SOLN
2.2500 g | Freq: Three times a day (TID) | INTRAVENOUS | Status: DC
  Administered 2014-09-25 – 2014-09-26 (×2): 2.25 g via INTRAVENOUS
  Filled 2014-09-25 (×4): qty 50

## 2014-09-25 MED ORDER — SODIUM CHLORIDE 0.9 % IV SOLN: Freq: Once | INTRAVENOUS | Status: DC

## 2014-09-25 MED ORDER — IPRATROPIUM-ALBUTEROL 0.5-2.5 (3) MG/3ML IN SOLN
3.0000 mL | Freq: Four times a day (QID) | RESPIRATORY_TRACT | Status: DC
  Administered 2014-09-25 – 2014-09-28 (×12): 3 mL via RESPIRATORY_TRACT
  Filled 2014-09-25 (×13): qty 3

## 2014-09-25 MED ORDER — FUROSEMIDE 10 MG/ML IJ SOLN
20.0000 mg | Freq: Once | INTRAMUSCULAR | Status: AC
  Administered 2014-09-25: 20 mg via INTRAVENOUS

## 2014-09-25 MED ORDER — CARVEDILOL 25 MG PO TABS
25.0000 mg | ORAL_TABLET | Freq: Two times a day (BID) | ORAL | Status: DC
  Administered 2014-09-25 – 2014-09-28 (×6): 25 mg via ORAL
  Filled 2014-09-25 (×9): qty 1

## 2014-09-25 MED ORDER — ALBUTEROL SULFATE (2.5 MG/3ML) 0.083% IN NEBU: 2.5000 mg | INHALATION_SOLUTION | Freq: Four times a day (QID) | RESPIRATORY_TRACT | Status: DC | PRN

## 2014-09-25 MED ORDER — DARBEPOETIN ALFA 100 MCG/0.5ML IJ SOSY
100.0000 ug | PREFILLED_SYRINGE | Freq: Once | INTRAMUSCULAR | Status: AC
  Administered 2014-09-25: 100 ug via SUBCUTANEOUS
  Filled 2014-09-25 (×2): qty 0.5

## 2014-09-25 MED ORDER — FUROSEMIDE 10 MG/ML IJ SOLN: 20.0000 mg | Freq: Once | INTRAMUSCULAR | Status: DC

## 2014-09-25 NOTE — Progress Notes (Signed)
  Date: 09/25/2014  Patient name: Donavan BurnetJieh T Salinger  Medical record number: [id number]  Date of birth: 10-Jan-1931   I have seen and evaluated Donavan BurnetJieh T Stiverson and discussed their care with the Residency Team.  Please see Dr. Eliane DecreePatel's note from 09/24/14.  Briefly, [name] is an 81yo [place] female with CKD stage 5, CAD s/p CABG, CHF, DM2, HLD, normocytic anemia, GERD, constipation who presented with chest pain, abdominal tightness and leg swelling for about 3 days.  No inciting event was noted, and the chest pain was sudden in onset.  The pain was worse with inspiration and palpation.  Further associated symptoms included vomiting.  She has been seen in the hospital for similar issues in February of this year.  She was also recently in the hospital for an ESBL UTI for which she was treated.  In the ED, she was given IV lasix.  Troponin X 1 negative.  On my exam, she was SOB, but without wheezing.  She was anxious about her breathing.  She had a RR, NR, breath sounds were clear, she had some upper airway gurgling but was able to communicate well to her family.  She had good bowel sounds, edema in legs much improved, only very mildly pitting.  Labs showed a Cr which was at baseline with a BUN of 42.  Hgb of 8.2 (this dropped to 6.9 this morning).  CXR showed pulmonary vascular congestion which had increased from previous.  + dysuria  Assessment and Plan: I have seen and evaluated the patient as outlined above. I agree with the formulated Assessment and Plan as detailed in the residents' admission note, with the following changes:   1. Volume overload, likely Renal contributing to CHF - Nephrology and Cardiology have been consulted - Lasix IV 20mg  given in addition to that received in the ED - Monitor BMET and kidney function closely - I/Os strict - Daily weights - Possible need for dialysis in the near future - Continue home medications of imdur, coreg 25mg  BID, asa, nitro prn  2. Atypical chest pain - both  reproducible and worse with inspiration - Cycle CE - Repeat EKG in th eAM - Telemetry  3. UTI with h/o urinary retention - UC pending - Given h/o ESBL organisms, on zosyn  4. Acute on chronic normocytic anemia - No active signs of bleeding - Trend CBC - 1 Unit PRBC followed by lasix - Consider starting aranesp  Other issues are noted in Dr. Eliane DecreePatel's H&P.    Inez CatalinaEmily B Maurilio Puryear, MD 5/25/20163:27 PM

## 2014-09-25 NOTE — Progress Notes (Addendum)
Called by RN that hemoglobin is down at 6.9 from 8.2 on presentation. Went to bedside and used [employer] over phone. Ms Bianca [name] says she has a cough with some associated chest pain and SOB but otherwise no weakness, hematuria, no bowel movement in over a day.  O: T 98.8 HR 70s BP 123/93 RR 19 Sat 97% Gen: NAD A&O x 3 CV: RRR, no MRG Lung: bibasilar crackles, unlabored respirations  A/P: Explained risks and benefits of blood transfusion. Given history of CAD s/p CABG, will repeat CBC and if true drop will transfuse with 1 U PRBC and give lasix 20 mg iv once afterwards given her CHF. Patient agrees. Also of note, troponins have been negative.  Farley LyAdam Keisa Blow, MD Internal Medicine, PGY-1 Pager [phone] 09/25/2014, 3:45 AM

## 2014-09-25 NOTE — Care Management Note (Signed)
Case Management Note  Patient Details  Name: Bianca [name] MRN: [id number] Date of Birth: 04-06-1931  Subjective/Objective:    Admitted with fluid overload                Action/Plan: CM following for DCP  Expected Discharge Date:       Possibly 09/30/2014           Expected Discharge Plan:   Home  Status of Service:   In progress   Additional Comments:  Reola MosherChandler, Bobby Barton L, RN,BSN,MHA [phone] 09/25/2014, 10:29 AM

## 2014-09-25 NOTE — Progress Notes (Signed)
Internal Medicine Teaching Service Interim Progress Note  Subjective:    I went to go see Bianca [name] this morning. Her morning Hb was down to 6.9 from 8.2 on admission. Bianca [name] speak camb[place]ian and her daughter in law was present in the room who speaks very little english. Translator was used over the phone to help with interview. Bianca [name] reports she has been coughing for the past 3 days and her breathing has been worse for the past week. Her cough is productive and when she coughs her chest hurts more localized mainly to the left side. She does day that her chest hurts on the left side even when not coughing but worse when we push on the area or when she coughs, however, she did not the chest pain improved while I was in the room. She denies any blood in her stool, initially said she had abdominal pain but then denied, and denies recent travel. She does not know who her cardiologist is. +does endorse some discomfort with urination.    Objective:    BP 123/93 mmHg  Pulse 73  Temp(Src) 98.8 F (37.1 C) (Oral)  Resp 19  Ht 5\' 1"  (1.549 m)  Wt 108 lb (48.988 kg)  BMI 20.42 kg/m2  SpO2 97%   Labs: Basic Metabolic Panel:    Component Value Date/Time   NA 138 09/25/2014 0144   K 4.4 09/25/2014 0144   CL 108 09/25/2014 0144   CO2 18* 09/25/2014 0144   BUN 41* 09/25/2014 0144   CREATININE 3.47* 09/25/2014 0144   CREATININE 3.63* 09/03/2014 1547   GLUCOSE 153* 09/25/2014 0144   CALCIUM 8.6* 09/25/2014 0144   CALCIUM 8.7 03/07/2012 1422   CBC:    Component Value Date/Time   WBC 8.0 09/25/2014 0144   HGB 6.9* 09/25/2014 0144   HCT 20.9* 09/25/2014 0144   PLT 121* 09/25/2014 0144   MCV 91.7 09/25/2014 0144   NEUTROABS 4.4 09/03/2014 1547   LYMPHSABS 1.1 09/03/2014 1547   MONOABS 0.4 09/03/2014 1547   EOSABS 0.1 09/03/2014 1547   BASOSABS 0.0 09/03/2014 1547    Cardiac Enzymes: Lab Results  Component Value Date   CKTOTAL 34 12/15/2010   CKMB 1.7 12/15/2010   TROPONINI  <0.03 09/25/2014   Physical Exam: General: Lying in bed, occasional productive cough with what appeared to be white sputum but she wiped it away quickly  Lungs:  Anterior expiratory wheezing and mild expiratory wheezing on upper lobes posteriorly with rales in mid/base region  Heart: RRR, +tenderness to palpation of mid sternal region  Abdomen:  BS+, soft, non-tender  Extremities: Rectal: Moving extremities, -tenderness to palpation, scd's in place +external hemorrhoid, minimal stool on glove on exam but no obvious blood    Assessment/ Plan:    Bianca [name] is an 81 year old female with CKD5, CAD s/p CABG, normocytic anemia and systolic CHF with EF 40-45% per echo 04/2014 and PA peak pressure of 39mmHg admitted for volume overload secondary to presumed CHF exacerbation. She has acute on chronic anemia with Hb down to 6.9 this morning, and atypical chest pain as well.   She received 40mg  IV lasix on admission with 1.65L output recorded so far. She does report cough and SOB for past few days and has some wheezing on exam. CE thus far have been negative and Hb was down to 6.9 this morning.  She does report some chest pain and initially did say that it was similar to her past MI but then  says it hurts most when she coughs and is also tender to palpation but then hurts without palpation as well. CP per patient improved during interview.  -daily weights and monitor I/O's, weight on discharge 4/26 103lb's, weight on admission 108 -consider repeat CXR in AM -day team will re-evaluate lasix dose in AM -stat repeat CBC pending -type and screen -I discussed the need for possible transfusion with patient this morning who wants [place] to discuss with her daughter and is willing to do whatever the daughter decides. I explained some of the possible risks with blood transfusion especially given her volume status and presumed CHF exacerbation for possible worsening of SOB or allergic reaction. I then discussed with her  daughter on the phone as well ?[name] who speaks very good english and she would like to think about the blood transfusion further and plans to be at the hospital this morning by 7am and would like to discuss with day team if possible. As such, we will hold off on transfusion at this time pending patient and family consent and awaiting repeat cbc. If she is to be transfused will need to be careful with volume status and she may require lasix post-transfusion.  -duoneb x1 for wheezing -robitussin DM for cough -hold AM heparin dose given acute on chronic anemia for now and allow day team to reassess -FOBT sent -check orthostatic vital signs -patient is written for tylenol for pain, consider morphine if patient willing -on BB, IMDUR, Statin, ASA, and lasix -consider repeat lipid panel -consider cards consult in AM--follows with Dr. Antoine Poche; has been medically managed in the past with noted intermediate risk nuclear study and renal insufficiency -continue to cycle CE -AM EKG -consider repeat echo--order placed for now but day team can decide on this further -will continue to monitor closely and discuss with day team in AM  Signed: Baltazar Apo, MD  PGY-3, Internal Medicine Resident 09/25/2014, 5:33 AM

## 2014-09-25 NOTE — Progress Notes (Signed)
BT order has been cancelled, verified by Valentino Saxonothman - on call.

## 2014-09-25 NOTE — Progress Notes (Signed)
Subjective: This AM, her daughter-in-law and daughter, [name], were at bedside. [name] explained to me what she had been told by the night team regarding her mother's decreased blood count, and I told her that certainly she could be losing blood though it could very be that her initial CBC may have been slightly inaccurate. Needless to say, I also explained to her that we wanted to repeat a CXR to assess how she is doing with her fluid status so as to decide if she needs more Lasix before receiving any blood products.  I also explained to her granddaughter who happens to be a nurse the current events and updates regarding her grandmother. We also discussed the implications of dialysis that would have on her quality of life, and she reported to me that she is in agreement that she would want further discussions with the family regarding her grandmother's goals of care. I also offered that to her clinic, we'll be happy to assist with such a discussion though language barrier present a challenge for which we could certainly use her assistance, especially given her medical knowledge.  Objective: Vital signs in last 24 hours: Filed Vitals:   09/24/14 2125 09/24/14 2137 09/25/14 0152 09/25/14 0500  BP: 123/64  123/93   Pulse: 74  73 74  Temp:  98.9 F (37.2 C) 98.8 F (37.1 C) 97.9 F (36.6 C)  TempSrc:  Oral Oral Oral  Resp: 18  19   Height:      Weight:    104 lb 15 oz (47.6 kg)  SpO2: 96%  97% 98%   Weight change:   Intake/Output Summary (Last 24 hours) at 09/25/14 0835 Last data filed at 09/25/14 0600  Gross per 24 hour  Intake    540 ml  Output   1650 ml  Net  -1110 ml   General: Elderly [place] female, resting in bed, uncomfortable breathing HEENT: PERRL, EOMI, no scleral icterus Cardiac: RRR, no rubs, murmurs or gallops Pulm: Bibasilar crackles in the posterior lung fields along with minor wheezing Abd: soft, nontender, mildly distended, BS present Ext: warm and well perfused,  no pitting edema noted on bilateral lower extremities  Lab Results: Basic Metabolic Panel:  Recent Labs Lab 09/24/14 1101 09/25/14 0144  NA 138 138  K 4.6 4.4  CL 110 108  CO2 18* 18*  GLUCOSE 239* 153*  BUN 42* 41*  CREATININE 3.26* 3.47*  CALCIUM 8.8* 8.6*  MG  --  2.1   Liver Function Tests:  Recent Labs Lab 09/24/14 1101  AST 27  ALT 33  ALKPHOS 71  BILITOT 0.6  PROT 6.5  ALBUMIN 3.4*   CBC:  Recent Labs Lab 09/25/14 0144 09/25/14 0428  WBC 8.0 7.7  HGB 6.9* 6.9*  HCT 20.9* 21.6*  MCV 91.7 91.9  PLT 121* 124*   Cardiac Enzymes:  Recent Labs Lab 09/24/14 1409 09/24/14 2015 09/25/14 0129  TROPONINI <0.03 <0.03 <0.03   CBG:  Recent Labs Lab 09/24/14 1646 09/24/14 2136 09/25/14 0624  GLUCAP 157* 227* 169*   Urine Drug Screen: Drugs of Abuse     Component Value Date/Time   LABOPIA NONE DETECTED 09/24/2014 1245   COCAINSCRNUR NONE DETECTED 09/24/2014 1245   LABBENZ NONE DETECTED 09/24/2014 1245   AMPHETMU NONE DETECTED 09/24/2014 1245   THCU NONE DETECTED 09/24/2014 1245   LABBARB NONE DETECTED 09/24/2014 1245    Urinalysis:  Recent Labs Lab 09/24/14 1245  COLORURINE YELLOW  LABSPEC 1.014  PHURINE 5.5  GLUCOSEU  500*  HGBUR NEGATIVE  BILIRUBINUR NEGATIVE  KETONESUR NEGATIVE  PROTEINUR >300*  UROBILINOGEN 0.2  NITRITE NEGATIVE  LEUKOCYTESUR TRACE*   Studies/Results: Dg Chest 2 View  09/24/2014   CLINICAL DATA:  Chest pain.  EXAM: CHEST  2 VIEW  COMPARISON:  August 23, 2014.  FINDINGS: Stable cardiomegaly. Status post coronary artery bypass graft. No pneumothorax or significant pleural effusion is noted. Mild central pulmonary vascular congestion is noted which is increased compared to prior exam. Probable minimal bilateral pulmonary edema is noted.  IMPRESSION: Mild central pulmonary vascular congestion is noted which is increased compared to prior exam. Minimal to mild bilateral pulmonary edema is noted as well.   Electronically  Signed   By: Lupita RaiderJames  Green Jr, M.D.   On: 09/24/2014 11:42   Medications: I have reviewed the patient's current medications. Scheduled Meds: . sodium chloride   Intravenous Once  . sodium chloride   Intravenous Once  . amLODipine  10 mg Oral Daily  . aspirin EC  81 mg Oral Daily  . budesonide  2 mL Inhalation BID  . carvedilol  25 mg Oral BID WC  . darbepoetin (ARANESP) injection - NON-DIALYSIS  100 mcg Subcutaneous Once  . docusate sodium  100 mg Oral BID  . hydrALAZINE  25 mg Oral TID  . insulin aspart  0-9 Units Subcutaneous TID WC  . isosorbide mononitrate  120 mg Oral Daily  . pantoprazole  40 mg Oral Daily  . piperacillin-tazobactam (ZOSYN)  IV  2.25 g Intravenous Q6H  . polyethylene glycol  17 g Oral TID  . rosuvastatin  20 mg Oral q1800  . sodium chloride  3 mL Intravenous Q12H   Continuous Infusions:  PRN Meds:.acetaminophen, albuterol, guaiFENesin-dextromethorphan, ipratropium-albuterol, nitroGLYCERIN, ondansetron (ZOFRAN) IV Assessment/Plan:  [name] is an 81 year old female with CKD Stage 5, CAD status post CABG, CHF, type 2 diabetes, hyperlipidemia, chronic normocytic anemia, asthma, GERD, constipation who presented with chest pain now with worsening anemia.  Acute on chronic normocytic anemia: Hemoglobin 6.92 since oadmission, baseline 8-10 since December 2015 decreased from admission. No signs of active bleeding and FOBT negative or reassuring. Anemia panel 08/24/14 notable for low normal iron 45, normal ferritin 158, low TIBC 200 suggestive of anemia of chronic disease. -Transfuse PRBC 1 and check posttransfusion CBC -Give Aranesp per nephrology -Check CBC tomorrow morning  Chest pain: Initially thought secondary to hypervolemia from worsening renal dysfunction and chronic CHF though angina cannot also be excluded. Her respiratory disease may also be contributory. It appears she may not be asking for her nebulizer treatments as they are scheduled as needed which may  be difficult given her language barrier. Physical exam findings on admission consistent with volume overload though inconsistent with weights as she is actually 9 pounds lighter in February. She continues to produce adequate urine with IV Lasix, and decision for dialysis is deferred per nephrology. -Give another Lasix 20 mg IV prior to receiving blood products -Repeat BMET tomorrow morning -Monitor ins and outs -Continue daily weights -Nephrology and cardiology following, appreciate recommendations -Schedule DuoNeb every 6 hours -Monitor on telemetry -Continue nitroglycerin sublingual prn for chest pain  UTI with history of urinary retention: UA notable for cloudy appearance, trace leukocytes, many bacteria. She also reports symptoms consistent with an infection. -Start Zosyn given concern for ESBL infection [antibiotics day 2] -Follow-up urine culture to narrow sensitivity -Check bladder scan  CKD Stage V complicated by non-gap metabolic acidosis: Creatinine 3.3 on admission, baseline mostly 3-3.5 over this past year  though has been steadily uptrending. Bicarbonate 18 on admission which appears to be at her baseline since December 2015. She was last seen by her nephrologist, Dr. Lowell Guitar, on 07/31/14 with follow-up in 4 months. Renal ultrasound last month was otherwise reassuring notable only for bilateral renal cysts. -Consider starting her on bicarbonate therapy -Follow-up PTH, vitamin D  CHF: Echo 04/30/14 notable for EF 40-45% with mild LVH, mild focal basal hypertrophy of the septum. She also has a history of CABG. Her medications include Imdur 120 mg daily, Coreg 25 mg twice daily, aspirin 81 mg, nitroglycerin 0.4 mg every 5 minutes as needed for chest pain. -Continue home medications  Type 2 diabetes: Glucose trending mostly mid to high 100s. A1c 6.9 when checked earlier this month though has been as high as 9.3 in February 2015. Her medications include Lantus 5 units at bedtime, NovoLog 8  units with breakfast, 8 units with lunch, 10 units with dinner. -Continue SSI-S  Hypertension: Currently normotensive. Home medications include Coreg 25 mg, hydralazine 25 mg 3 times daily, amlodipine 10 mg. -Continue home medications  Hyperlipidemia: Most recent lipid panel in February 2015 notable for total cholesterol 218, HDL 65, LDL 106, triglycerides 235. Her medications include Crestor 20 mg. -Continue home medications  Acute hepatitis B infection: Per chart review, appears that she had a healthcare exposure to HBV after hospitalization in April. Labs checked at office visit 5/10 notable for reactive HBV core antibody, elevated HBV DNA, positive HBVe antibody, unreactive HBV surface antibody, reactive HBV surface antigen which is suggestive of acute infection. LFTs reassuring on admission. -Continue assessing  Asthma: Her medications include albuterol 1 puff every 6 hours as needed for wheezing, Flovent 1 puff twice daily. -Continue home medications  GERD: Home medications include Prilosec 20 mg. -Continue Protonix 40 mg daily  Constipation: Continue MiraLAX 3 times daily.  #FEN:  -Diet: Renal with fluid restriction  #DVT prophylaxis: Heparin   #CODE STATUS: FULL CODE -Defer to Mie if patients lacks decision-making capacity -Confirmed with patient on admission  Dispo: Disposition is deferred at this time, awaiting improvement of current medical problems.   The patient does have a current PCP (Carly Arlyce Harman, MD) and does need an [facility] hospital follow-up appointment after discharge.  The patient does have transportation limitations that hinder transportation to clinic appointments.  .Services Needed at time of discharge: Y = Yes, Blank = No PT:   OT:   RN:   Equipment:   Other:     LOS: 1 day   [name], MD 09/25/2014, 8:35 AM

## 2014-09-25 NOTE — Consult Note (Signed)
Reason for Consult: Congestive heart failure  Requesting Physician: Criselda Peaches  Cardiologist: Hochrein  HPI: This is a 81 y.o. female with a past medical history significant for CAD status post CABG 8 years ago, mildly depressed left ventricle systolic function with evidence of prior inferior infarction, advanced chronic kidney disease at least stage IV (possibly now stage V), chronic anemia with recent worsening (normocytic normochromic) presents with relatively rapid onset of chest tightness and severe dyspnea following self administration of insulin for severe hyperglycemia. Prior to this she had been going slowly downhill with reduced intake of food and fluids and generalized malaise and sleepiness. She also had complaints of abdominal tightness and a couple of episodes of vomiting which has subsequently resolved. She is currently free of chest tightness and although still clearly tachypneic, she denies dyspnea at rest. She is sleepy. She speaks very limited [place]. Her granddaughter, Pedro Earls, provides translation over the phone.  This is her third hospitalization in the last 3 months. She was here in February and April with congestive heart failure. She was last seen in the clinic in late March. At that time she appeared to be close to euvolemic, today she actually weighs 9 pounds less than she did at that office appointment. She does not have any edema, but does have jugular venous distention.  Her last echocardiogram in December 2015 showed a left ventricular ejection fraction of 40-45 percent. Conversely, her nuclear perfusion study performed around the same time showed an ejection fraction of 74% with a moderate sized inferior wall infarct but no reversible ischemia. It is doubtful that the latter assessment of systolic function is accurate. Coronary angiography has been entertained, but deferred due to severely abnormal and worsening renal function.  PMHx:  Past Medical History    Diagnosis Date  . GERD (gastroesophageal reflux disease)   . Hepatitis B   . Hyperlipemia   . Hypertension   . Cervical dysplasia   . Atrophic vaginitis   . Spinal stenosis     s/p decompression  . CAD (coronary artery disease) April 2008    s/p CABG;    cath 8/12:  LM patent, pLAD occluded, OM1 30-40%, pOM2 80% and 60% after the anastomosis, pRCA occluded, S-DX occluded (old), S-OM 2 occluded (new), L-LAD patent, dLAD provided collats to the PDA; Med Rx  rec.; consider PCI to OM2 if fails med Rx  . Hx of hysterectomy     daughter, Rocky Crafts, denies this hx on 08/23/2014  . UTI (lower urinary tract infection)   . Diverticulosis of intestine 04/29/2014    Of ascending colon noted on CT abd/ pelvis 04/20/14  . Diabetes mellitus, type 2     insulin dependent  . COPD (chronic obstructive pulmonary disease)     Hattie Perch 08/23/2014  . Chronic kidney disease (CKD), stage IV (severe)     Hattie Perch 08/23/2014  . CHF (congestive heart failure)     Hattie Perch 08/23/2014  . Chronic lower back pain    Past Surgical History  Procedure Laterality Date  . Esophagogastroduodenoscopy Left 04/22/2014    Procedure: ESOPHAGOGASTRODUODENOSCOPY (EGD);  Surgeon: Shirley Friar, MD;  Location: [facility] ENDOSCOPY;  Service: Endoscopy;  Laterality: Left;  . Total abdominal hysterectomy      cervical dysplasia; daughter, Rocky Crafts denies this hx on 08/23/2014  . Back surgery    . Lumbar laminectomy/decompression microdiscectomy  02/2000    Central and lateral decompression L3-4, L4-5, L5-S1./notes 09/15/2010  . Coronary artery bypass graft  April 2008    "  CABG X5"  . Cardiac catheterization  08/2006; 09/2006; 2011,  12/2010    Hattie Perch 5/15/2012Marland Kitchen Hattie Perch 09/02/2010; Hattie Perch 09/14/2010    FAMHx: History reviewed. No pertinent family history.  SOCHx:  reports that she has never smoked. She has never used smokeless tobacco. She reports that she does not drink alcohol or use illicit drugs.  ALLERGIES: Allergies  Allergen Reactions  .  Cozaar Other (See Comments)    Sig increase in her creatinine that resolved once it was D/C'd  Per Cr visible in epic, it seems her baseline Cr = 1.7-1.9, and the slight improvement to Cr was an outlier when compared to recent Cr.  We will do a trial of Lisinopril (and monitor for 30% rise in Cr), as she would greatly benefit from renal protection.  . Lisinopril     Cough     ROS: Difficult to obtain due to her somnolence and the language barrier. Anorexia and dysgeusia are prominent complaints.  HOME MEDICATIONS: Prescriptions prior to admission  Medication Sig Dispense Refill Last Dose  . acetaminophen (TYLENOL) 500 MG tablet Take 2 tablets (1,000 mg total) by mouth every 8 (eight) hours as needed for mild pain. 100 tablet 2 09/23/2014 at Unknown time  . amLODipine (NORVASC) 10 MG tablet Take 1 tablet (10 mg total) by mouth daily. 90 tablet 1 09/23/2014 at Unknown time  . aspirin EC 81 MG tablet TAKE 1 TABLET BY MOUTH EVERY DAY 150 tablet 1 09/23/2014 at Unknown time  . B-D ULTRAFINE III SHORT PEN 31G X 8 MM MISC USE AS DIRECTED TO INJECT INSULIN 3 TIMES A DAY 100 each 6 unknown  . BAYER MICROLET LANCETS lancets Use as instructed 200 each 5 unknown  . carvedilol (COREG) 25 MG tablet TAKE 1 TABLET (25 MG TOTAL) BY MOUTH 2 (TWO) TIMES DAILY WITH A MEAL. 120 tablet 3 09/23/2014 at 2000  . CRESTOR 20 MG tablet TAKE 1 TABLET (20 MG TOTAL) BY MOUTH DAILY. 90 tablet 3 09/23/2014 at Unknown time  . docusate sodium (COLACE) 100 MG capsule Take 100 mg by mouth 2 (two) times daily.   09/23/2014 at Unknown time  . fluticasone (FLOVENT HFA) 44 MCG/ACT inhaler Inhale 1 puff into the lungs 2 (two) times daily. 1 Inhaler 12 09/23/2014 at Unknown time  . glucose blood (BAYER CONTOUR NEXT TEST) test strip 1 each by Other route as needed for other. Use 4 to 5 times daily to check blood sugar. diag code E11.9. Insulin dependent 200 each 5 unknown  . hydrALAZINE (APRESOLINE) 25 MG tablet Take 1 tablet (25 mg total)  by mouth 3 (three) times daily. 270 tablet 5 09/23/2014 at Unknown time  . HYDROcodone-acetaminophen (NORCO/VICODIN) 5-325 MG per tablet Take 2 tablets by mouth every 4 (four) hours as needed. 12 tablet 0 Past Week at Unknown time  . insulin aspart (NOVOLOG FLEXPEN) 100 UNIT/ML FlexPen Please take 8 units with breakfast, 8 units with lunch, and 10 units with dinner/supper 100 mL 9 09/23/2014 at Unknown time  . insulin glargine (LANTUS) 100 UNIT/ML injection Inject 0.05 mLs (5 Units total) into the skin at bedtime. 10 mL 5 09/23/2014 at Unknown time  . isosorbide mononitrate (IMDUR) 120 MG 24 hr tablet Take 120 mg by mouth daily.  1 09/23/2014 at Unknown time  . nitroGLYCERIN (NITROSTAT) 0.4 MG SL tablet Place 0.4 mg under the tongue every 5 (five) minutes as needed. For chest pain. Maximum of 3 doses   unknown  . omeprazole (PRILOSEC) 20 MG capsule TAKE  1 CAPSULE (20 MG TOTAL) BY MOUTH DAILY. 30 capsule 0 09/23/2014 at Unknown time  . polyethylene glycol (MIRALAX) packet Take 17 g by mouth 3 (three) times daily. 100 each 0 09/23/2014 at Unknown time  . PROAIR HFA 108 (90 BASE) MCG/ACT inhaler INHALE 1 PUFF EVERY 6 HOURS AS NEEDED FOR WHEEZE 8.5 each 2 09/23/2014 at Unknown time  . Pseudoeph-Doxylamine-DM-APAP (NYQUIL PO) Take 1 Dose by mouth once.   09/24/2014 at Unknown time  . calcium citrate-vitamin D (CITRACAL+D) 315-200 MG-UNIT per tablet Take 2 tablets by mouth 2 (two) times daily. (Patient not taking: Reported on 08/05/2014) 100 tablet 3 Not Taking at Unknown time  . diclofenac sodium (VOLTAREN) 1 % GEL Apply 2 g topically 4 (four) times daily. (Patient not taking: Reported on 08/23/2014) 100 g 0 Not Taking at Unknown time  . sennosides-docusate sodium (SENOKOT-S) 8.6-50 MG tablet Take 1 tablet by mouth daily. (Patient not taking: Reported on 08/05/2014) 90 tablet 3 Not Taking at Unknown time    HOSPITAL MEDICATIONS: Scheduled: . sodium chloride   Intravenous Once  . amLODipine  10 mg Oral Daily  .  aspirin EC  81 mg Oral Daily  . budesonide  2 mL Inhalation BID  . carvedilol  25 mg Oral BID WC  . docusate sodium  100 mg Oral BID  . furosemide  20 mg Intravenous Once  . hydrALAZINE  25 mg Oral TID  . insulin aspart  0-9 Units Subcutaneous TID WC  . ipratropium-albuterol  3 mL Nebulization Q6H  . isosorbide mononitrate  120 mg Oral Daily  . pantoprazole  40 mg Oral Daily  . piperacillin-tazobactam (ZOSYN)  IV  2.25 g Intravenous 3 times per day  . polyethylene glycol  17 g Oral TID  . rosuvastatin  20 mg Oral q1800  . sodium chloride  3 mL Intravenous Q12H   Continuous:   VITALS: Blood pressure 132/69, pulse 74, temperature 97.9 F (36.6 C), temperature source Oral, resp. rate 19, height  (1.549 m), weight 47.6 kg (104 lb 15 oz), SpO2 98 %.  PHYSICAL EXAM:  General: Sleepy, oriented x3, no distress, undernourished, frail-appearing Head: no evidence of trauma, PERRL, EOMI, no exophtalmos or lid lag, no myxedema, no xanthelasma; normal ears, nose and oropharynx Neck: 7-8 centimeters elevation in jugular venous pulsations during expiration and prompt hepatojugular reflux; brisk carotid pulses without delay and no carotid bruits Chest: Bilateral expiratory wheezes, no signs of consolidation by percussion or palpation, normal fremitus, symmetrical and full respiratory excursions Cardiovascular: normal position and quality of the apical impulse, regular rhythm, heart to hear heart sounds due to prominent wheezing and inability to hold her breath, normal first heart sound and normal second heart sound, no rubs or gallops, no murmur Abdomen: no tenderness or distention, no masses by palpation, no abnormal pulsatility or arterial bruits, normal bowel sounds, no hepatosplenomegaly Extremities: no clubbing, cyanosis;  no edema; 2+ radial, ulnar and brachial pulses bilaterally; 2+ right femoral, posterior tibial and dorsalis pedis pulses; 2+ left femoral, posterior tibial and dorsalis pedis  pulses; no subclavian or femoral bruits Neurological: grossly nonfocal   LABS  CBC  Recent Labs  09/25/14 0144 09/25/14 0428  WBC 8.0 7.7  HGB 6.9* 6.9*  HCT 20.9* 21.6*  MCV 91.7 91.9  PLT 121* 124*   Basic Metabolic Panel  Recent Labs  09/24/14 1101 09/25/14 0144  NA 138 138  K 4.6 4.4  CL 110 108  CO2 18* 18*  GLUCOSE 239* 153*  BUN 42* 41*  CREATININE 3.26* 3.47*  CALCIUM 8.8* 8.6*  MG  --  2.1   Liver Function Tests  Recent Labs  09/24/14 1101  AST 27  ALT 33  ALKPHOS 71  BILITOT 0.6  PROT 6.5  ALBUMIN 3.4*   No results for input(s): LIPASE, AMYLASE in the last 72 hours. Cardiac Enzymes  Recent Labs  09/24/14 1409 09/24/14 2015 09/25/14 0129  TROPONINI <0.03 <0.03 <0.03      IMAGING: Dg Chest 2 View  09/24/2014   CLINICAL DATA:  Chest pain.  EXAM: CHEST  2 VIEW  COMPARISON:  August 23, 2014.  FINDINGS: Stable cardiomegaly. Status post coronary artery bypass graft. No pneumothorax or significant pleural effusion is noted. Mild central pulmonary vascular congestion is noted which is increased compared to prior exam. Probable minimal bilateral pulmonary edema is noted.  IMPRESSION: Mild central pulmonary vascular congestion is noted which is increased compared to prior exam. Minimal to mild bilateral pulmonary edema is noted as well.   Electronically Signed   By: Lupita RaiderJames  Green Jr, M.D.   On: 09/24/2014 11:42   Dg Chest Port 1 View  09/25/2014   CLINICAL DATA:  Dyspnea  EXAM: PORTABLE CHEST - 1 VIEW  COMPARISON:  09/24/2014  FINDINGS: Cardiomediastinal silhouette is stable. Status post CABG. Central mild vascular congestion and mild perihilar interstitial prominence without convincing pulmonary edema. No segmental infiltrate.  IMPRESSION: Status post CABG. Central mild vascular congestion and mild perihilar interstitial prominence without convincing pulmonary edema. No segmental infiltrate.   Electronically Signed   By: Natasha MeadLiviu  Pop M.D.   On: 09/25/2014  08:34    ECG: Normal sinus rhythm, old inferior Q waves, no acute repolarization abnormalities  TELEMETRY:  normal sinus rhythm  IMPRESSION: 1. Acute pulmonary edema, improved 2. Acute on chronic combined systolic and diastolic heart failure, still with residual signs of hypervolemia 3. Unstable angina, without high risk biochemical or ECG features suggest acute coronary syndrome 4. CAD with extensive native artery and graft disease 5. Moderate to severe normocytic/normochromic anemia, at least in part related to chronic kidney disease 6. Chronic kidney disease stage 4-5   RECOMMENDATION: The most likely scenario seems to be angina and heart failure exacerbated by moderate to severe anemia. Acute coronary syndrome is a possibility, but there are no high-risk features to support this at this time. She is at very high risk for permanent renal failure with invasive contrast based angiography. No plan for cardiac catheterization at this point. Continue carvedilol and amlodipine in current maximum doses as well as current dose of long-acting nitrates  She will probably benefit from transfusion, but this needs to be performed slowly and with accompanying administration of intravenous diabetics to avoid fluid overload. She is elderly, frail and has numerous serious comorbid conditions. Prognosis is poor. Erythropoietin therapy is probably indicated. I think she is a poor candidate for chronic hemodialysis. Goals of care conference with family is appropriate. I think a full CODE STATUS is probably not appropriate at this stage in her illness.  Time Spent Directly with Patient: 60 minutes  Thurmon FairMihai Harleigh Civello, MD, [facility] Hospital[facility]ACC [facility] [phone] office [phone] pager   09/25/2014, 11:07 AM

## 2014-09-25 NOTE — Patient Outreach (Signed)
[facility] Care Management  09/25/2014  Donavan BurnetJieh T Gesner 09/11/1930 [id number]  Referral for community resources. Per Epic review,  patient is currently hospitalized. Patient has complex diagnoses with frequent admissions. Patient has Medicaid with [place] access. Care coordination call to [facility] to assess if eligible for their community case management program. Per Larene BeachVickie, patient is eligible for case management program via [facility]. States patient was activated today and will be seen in several days after discharge from hospital. States assigned Case Manager-Tiffany Kellam-[phone].  Plan[facility]; enrolled in external program.  Colleen CanLinda Tab Rylee, RN BSN CCM Care Management Coordinator [facility]  [phone]

## 2014-09-25 NOTE — Progress Notes (Addendum)
Called by RN regarding 20 beat run of V. tach. Upon review of telemetry with Dr. Royann Shiversroitoru [cardiology], it appeared to be atrial fibrillation with aberrancy given the regular rate of the rhythm though the morphology certainly resembled ventricular tachycardia.  At bedside, patient denied any complaints and I she appeared improved as compared to admission. On exam, wheezing was still audible in the posterior lung fields, so I asked that she receive Xopenex inhaler and explained to the daughter that her mother needs to ask for this medication is that she has provided a 1-2 puffs as needed for wheezing in between her nebulizer treatments. The daughter acknowledged understanding and would explain these instructions to her mother.   I attempted to reach daughter Mie to clarify goals of care but was unsuccessful on two attempts. I will try again tomorrow.

## 2014-09-25 NOTE — Progress Notes (Signed)
Spoke with MD earlier not to give blood until family member will come for consent to be signed as per pt request.

## 2014-09-25 NOTE — Progress Notes (Signed)
CRITICAL VALUE ALERT  Critical value received:  Hgb 6.9  Date of notification:  09/25/2014   Time of notification:  0306  Critical value read back:Yes.    Nurse who received alert:  Leanor KailAmor Buendia  MD notified (1st page):  Valentino SaxonRothman - on call  Time of first page:  0312  MD notified (2nd page):  Time of second page:  Responding MD:  Valentino Saxonothman  Time MD responded:  [phone]

## 2014-09-25 NOTE — Progress Notes (Signed)
Patient had a 20 beat run of V-Tach. Patient's family states that she is complaining of chest pain when coughing only. MD made aware. MD will be up to the unit to further assess patient. No new orders at this time. Will continue to monitor patient for further changes in condition.

## 2014-09-25 NOTE — Consult Note (Signed)
Referring Provider: No ref. provider found Primary Care Physician:  Rich Number, MD Primary Nephrologist:  [facility]  Reason for Consultation:  Stage 3/4 Chronic renal disease   HPI:Bianca [name] is an 81 year old female with CKD Stage 4/5, CAD status post CABG, CHF, type 2 diabetes, hyperlipidemia, chronic normocytic anemia, asthma, GERD, constipation who presented with chest pain, abdominal tightness, leg swelling.  Baseline creatinine in 3 range , she is being actively followed by [facility]   There are no use of NSAIDS and no administration of nephrotoxins She is urinating with no difficulty and responding to IV lasix There is no tobacco use She is compliant with salt restriction No FH or renal disease  Past Medical History  Diagnosis Date  . GERD (gastroesophageal reflux disease)   . Hepatitis B   . Hyperlipemia   . Hypertension   . Cervical dysplasia   . Atrophic vaginitis   . Spinal stenosis     s/p decompression  . CAD (coronary artery disease) April 2008    s/p CABG;    cath 8/12:  LM patent, pLAD occluded, OM1 30-40%, pOM2 80% and 60% after the anastomosis, pRCA occluded, S-DX occluded (old), S-OM 2 occluded (new), L-LAD patent, dLAD provided collats to the PDA; Med Rx  rec.; consider PCI to OM2 if fails med Rx  . Hx of hysterectomy     daughter, Rocky Crafts, denies this hx on 08/23/2014  . UTI (lower urinary tract infection)   . Diverticulosis of intestine 04/29/2014    Of ascending colon noted on CT abd/ pelvis 04/20/14  . Diabetes mellitus, type 2     insulin dependent  . COPD (chronic obstructive pulmonary disease)     Hattie Perch 08/23/2014  . Chronic kidney disease (CKD), stage IV (severe)     Hattie Perch 08/23/2014  . CHF (congestive heart failure)     Hattie Perch 08/23/2014  . Chronic lower back pain     Past Surgical History  Procedure Laterality Date  . Esophagogastroduodenoscopy Left 04/22/2014    Procedure: ESOPHAGOGASTRODUODENOSCOPY (EGD);  Surgeon: Shirley Friar,  MD;  Location: [facility] ENDOSCOPY;  Service: Endoscopy;  Laterality: Left;  . Total abdominal hysterectomy      cervical dysplasia; daughter, Rocky Crafts denies this hx on 08/23/2014  . Back surgery    . Lumbar laminectomy/decompression microdiscectomy  02/2000    Central and lateral decompression L3-4, L4-5, L5-S1./notes 09/15/2010  . Coronary artery bypass graft  April 2008    "CABG X5"  . Cardiac catheterization  08/2006; 09/2006; 2011,  12/2010    Hattie Perch 5/15/2012Marland Kitchen Hattie Perch 5/2/2012Marland Kitchen Hattie Perch 09/14/2010    Prior to Admission medications   Medication Sig Start Date End Date Taking? Authorizing Provider  acetaminophen (TYLENOL) 500 MG tablet Take 2 tablets (1,000 mg total) by mouth every 8 (eight) hours as needed for mild pain. 12/25/13 12/25/14 Yes Carly J Rivet, MD  amLODipine (NORVASC) 10 MG tablet Take 1 tablet (10 mg total) by mouth daily. 08/14/14  Yes Gust Rung, DO  aspirin EC 81 MG tablet TAKE 1 TABLET BY MOUTH EVERY DAY 01/03/13  Yes Lorretta Harp, MD  B-D ULTRAFINE III SHORT PEN 31G X 8 MM MISC USE AS DIRECTED TO INJECT INSULIN 3 TIMES A DAY 03/12/14  Yes Su Hoff, MD  BAYER MICROLET LANCETS lancets Use as instructed 08/14/14  Yes Gust Rung, DO  carvedilol (COREG) 25 MG tablet TAKE 1 TABLET (25 MG TOTAL) BY MOUTH 2 (TWO) TIMES DAILY WITH A MEAL. 04/23/14  Yes Carly J  Rivet, MD  CRESTOR 20 MG tablet TAKE 1 TABLET (20 MG TOTAL) BY MOUTH DAILY. 11/22/13  Yes Levert Feinstein, MD  docusate sodium (COLACE) 100 MG capsule Take 100 mg by mouth 2 (two) times daily.   Yes Historical Provider, MD  fluticasone (FLOVENT HFA) 44 MCG/ACT inhaler Inhale 1 puff into the lungs 2 (two) times daily. 06/19/14  Yes Harold Barban, MD  glucose blood (BAYER CONTOUR NEXT TEST) test strip 1 each by Other route as needed for other. Use 4 to 5 times daily to check blood sugar. diag code E11.9. Insulin dependent 08/14/14  Yes Gust Rung, DO  hydrALAZINE (APRESOLINE) 25 MG tablet Take 1 tablet (25 mg total) by mouth 3 (three)  times daily. 06/11/13  Yes Lorretta Harp, MD  HYDROcodone-acetaminophen (NORCO/VICODIN) 5-325 MG per tablet Take 2 tablets by mouth every 4 (four) hours as needed. 08/30/14  Yes Kaitlyn Szekalski, PA-C  insulin aspart (NOVOLOG FLEXPEN) 100 UNIT/ML FlexPen Please take 8 units with breakfast, 8 units with lunch, and 10 units with dinner/supper 06/19/14  Yes Harold Barban, MD  insulin glargine (LANTUS) 100 UNIT/ML injection Inject 0.05 mLs (5 Units total) into the skin at bedtime. 08/27/14  Yes Harold Barban, MD  isosorbide mononitrate (IMDUR) 120 MG 24 hr tablet Take 120 mg by mouth daily. 09/04/14  Yes Historical Provider, MD  nitroGLYCERIN (NITROSTAT) 0.4 MG SL tablet Place 0.4 mg under the tongue every 5 (five) minutes as needed. For chest pain. Maximum of 3 doses   Yes Historical Provider, MD  omeprazole (PRILOSEC) 20 MG capsule TAKE 1 CAPSULE (20 MG TOTAL) BY MOUTH DAILY. 07/30/14  Yes Carly Arlyce Harman, MD  polyethylene glycol (MIRALAX) packet Take 17 g by mouth 3 (three) times daily. 09/03/14  Yes Carolan Clines, MD  PROAIR HFA 108 (90 BASE) MCG/ACT inhaler INHALE 1 PUFF EVERY 6 HOURS AS NEEDED FOR WHEEZE 04/05/14  Yes Carly J Rivet, MD  Pseudoeph-Doxylamine-DM-APAP (NYQUIL PO) Take 1 Dose by mouth once.   Yes Historical Provider, MD  calcium citrate-vitamin D (CITRACAL+D) 315-200 MG-UNIT per tablet Take 2 tablets by mouth 2 (two) times daily. Patient not taking: Reported on 08/05/2014 12/10/13   Iris Pert Rivet, MD  diclofenac sodium (VOLTAREN) 1 % GEL Apply 2 g topically 4 (four) times daily. Patient not taking: Reported on 08/23/2014 06/25/14   Iris Pert Rivet, MD  sennosides-docusate sodium (SENOKOT-S) 8.6-50 MG tablet Take 1 tablet by mouth daily. Patient not taking: Reported on 08/05/2014 04/11/12   Lorretta Harp, MD    Current Facility-Administered Medications  Medication Dose Route Frequency Provider Last Rate Last Dose  . 0.9 %  sodium chloride infusion   Intravenous Once Lorenda Hatchet, MD      . acetaminophen  (TYLENOL) tablet 1,000 mg  1,000 mg Oral Q8H PRN Annett Gula, MD   1,000 mg at 09/24/14 1828  . albuterol (PROVENTIL) (2.5 MG/3ML) 0.083% nebulizer solution 3 mL  3 mL Inhalation Q6H PRN Annett Gula, MD      . amLODipine (NORVASC) tablet 10 mg  10 mg Oral Daily Annett Gula, MD   10 mg at 09/24/14 1828  . aspirin EC tablet 81 mg  81 mg Oral Daily Annett Gula, MD      . budesonide (PULMICORT) nebulizer solution 0.25 mg  2 mL Inhalation BID Inez Catalina, MD   0.25 mg at 09/24/14 2050  . carvedilol (COREG) tablet 25 mg  25 mg Oral BID WC Inez Catalina,  MD      . docusate sodium (COLACE) capsule 100 mg  100 mg Oral BID Annett Gula, MD   100 mg at 09/24/14 2217  . guaiFENesin-dextromethorphan (ROBITUSSIN DM) 100-10 MG/5ML syrup 5 mL  5 mL Oral Q4H PRN Baltazar Apo, MD      . hydrALAZINE (APRESOLINE) tablet 25 mg  25 mg Oral TID Annett Gula, MD   25 mg at 09/24/14 2217  . insulin aspart (novoLOG) injection 0-9 Units  0-9 Units Subcutaneous TID WC Annett Gula, MD      . ipratropium-albuterol (DUONEB) 0.5-2.5 (3) MG/3ML nebulizer solution 3 mL  3 mL Nebulization Q6H PRN Baltazar Apo, MD      . isosorbide mononitrate (IMDUR) 24 hr tablet 120 mg  120 mg Oral Daily Annett Gula, MD   120 mg at 09/24/14 1828  . nitroGLYCERIN (NITROSTAT) SL tablet 0.4 mg  0.4 mg Sublingual Q5 min PRN Annett Gula, MD      . ondansetron [facility]) injection 4 mg  4 mg Intravenous Q6H PRN Annett Gula, MD      . pantoprazole (PROTONIX) EC tablet 40 mg  40 mg Oral Daily Annett Gula, MD      . piperacillin-tazobactam (ZOSYN) IVPB 2.25 g  2.25 g Intravenous Q6H Rachel L Rumbarger, RPH 100 mL/hr at 09/25/14 0453 2.25 g at 09/25/14 0453  . polyethylene glycol (MIRALAX / GLYCOLAX) packet 17 g  17 g Oral TID Annett Gula, MD   17 g at 09/24/14 2217  . rosuvastatin (CRESTOR) tablet 20 mg  20 mg Oral q1800 Annett Gula, MD   20 mg at 09/24/14 1828  . sodium chloride 0.9 % injection 3 mL  3 mL  Intravenous Q12H Annett Gula, MD   3 mL at 09/24/14 2218    Allergies as of 09/24/2014 - Review Complete 09/24/2014  Allergen Reaction Noted  . Cozaar Other (See Comments) 02/12/2011  . Lisinopril  05/24/2012    History reviewed. No pertinent family history.  History   Social History  . Marital Status: Widowed    Spouse Name: N/A  . Number of Children: N/A  . Years of Education: N/A   Occupational History  . Not on file.   Social History Main Topics  . Smoking status: Never Smoker   . Smokeless tobacco: Never Used  . Alcohol Use: No  . Drug Use: No  . Sexual Activity: Not on file   Other Topics Concern  . Not on file   Social History Narrative   [place] immigrant   Housewife   Widowed   Lives w/ her son    Review of Systems: Gen: +  weakness, malaise, weight loss, and sleep disorder HEENT: No visual complaints, No history of Retinopathy. Normal external appearance No Epistaxis or Sore throat. No sinusitis.   CV: +  chest pain,  No orthopnea,No  PND, +  peripheral edema,  Resp: Denies dyspnea at rest, dyspnea with exercise,  + cough, +sputum, +wheezing,no coughing up blood,  GI: Denies vomiting blood, jaundice, and fecal incontinence.   Denies dysphagia or odynophagia. GU : Denies urinary burning, blood in urine, urinary frequency, urinary hesitancy, nocturnal urination, and urinary incontinence.  No renal calculi. MS: Denies joint pain, limitation of movement, and swelling, stiffness, low back pain, extremity pain. Denies muscle weakness, cramps, atrophy.  No use of non steroidal antiinflammatory drugs. Derm: Denies rash, itching, dry skin, hives, moles, warts, or unhealing ulcers.  Psych: Denies depression, anxiety, memory loss, suicidal ideation, hallucinations, paranoia, and confusion. Heme: Denies bruising, bleeding, and enlarged lymph nodes. Neuro: No headache.  No diplopia. No dysarthria.  No dysphasia.  No history of CVA.  No Seizures. No paresthesias.  No  weakness. Endocrine No DM.  No Thyroid disease.  No Adrenal disease.  Physical Exam: Vital signs in last 24 hours: Temp:  [97.9 F (36.6 C)-98.9 F (37.2 C)] 97.9 F (36.6 C) (05/25 0500) Pulse Rate:  [72-88] 74 (05/25 0500) Resp:  [14-24] 19 (05/25 0152) BP: (122-174)/(41-93) 122/59 mmHg (05/25 0500) SpO2:  [93 %-100 %] 98 % (05/25 0500) Weight:  [47.6 kg (104 lb 15 oz)-49.5 kg (109 lb 2 oz)] 47.6 kg (104 lb 15 oz) (05/25 0500) Last BM Date: 09/24/14 General:   Elderly lady no distress Head:  Normocephalic and atraumatic. Eyes:  Sclera clear, no icterus.   Conjunctiva pink. Ears:  Normal auditory acuity. Nose:  No deformity, discharge,  or lesions. Mouth:  No deformity or lesions, dentition normal. Neck:  Supple; no masses or thyromegaly. JVP not elevated Lungs:  .   + wheezes, no crackles, no rhonchi. No acute distress. Heart:  Regular rate and rhythm; no murmurs, clicks, rubs,  or gallops. Abdomen:  Soft, nontender and nondistended. No masses, hepatosplenomegaly or hernias noted. Normal bowel sounds, without guarding, and without rebound.   Msk:  Symmetrical without gross deformities. Normal posture. Pulses:  No carotid, renal, femoral bruits. DP and PT symmetrical and equal Extremities: 1 + edema Skin:  Intact without significant lesions or rashes.   Intake/Output from previous day: 05/24 0701 - 05/25 0700 In: 540 [P.O.:440; IV Piggyback:100] Out: 1650 [Urine:1650] Intake/Output this shift: Total I/O In: 540 [P.O.:440; IV Piggyback:100] Out: 700 [Urine:700]  Lab Results:  Recent Labs  09/24/14 1101 09/25/14 0144 09/25/14 0428  WBC 8.6 8.0 7.7  HGB 8.2* 6.9* 6.9*  HCT 25.2* 20.9* 21.6*  PLT 118* 121* 124*   BMET  Recent Labs  09/24/14 1101 09/25/14 0144  NA 138 138  K 4.6 4.4  CL 110 108  CO2 18* 18*  GLUCOSE 239* 153*  BUN 42* 41*  CREATININE 3.26* 3.47*  CALCIUM 8.8* 8.6*   LFT  Recent Labs  09/24/14 1101  PROT 6.5  ALBUMIN 3.4*  AST 27   ALT 33  ALKPHOS 71  BILITOT 0.6   PT/INR No results for input(s): LABPROT, INR in the last 72 hours. Hepatitis Panel No results for input(s): HEPBSAG, HCVAB, HEPAIGM, HEPBIGM in the last 72 hours.  Studies/Results: Dg Chest 2 View  09/24/2014   CLINICAL DATA:  Chest pain.  EXAM: CHEST  2 VIEW  COMPARISON:  August 23, 2014.  FINDINGS: Stable cardiomegaly. Status post coronary artery bypass graft. No pneumothorax or significant pleural effusion is noted. Mild central pulmonary vascular congestion is noted which is increased compared to prior exam. Probable minimal bilateral pulmonary edema is noted.  IMPRESSION: Mild central pulmonary vascular congestion is noted which is increased compared to prior exam. Minimal to mild bilateral pulmonary edema is noted as well.   Electronically Signed   By: Lupita Raider, M.D.   On: 09/24/2014 11:42    Assessment/Plan:  CKD 4 baseline creatinine about 3  There have been no plans to initiate dialysis and we shall need to have a discussion regarding goal of care. Luckily she is reponding to lasix and there are no emergent indications for dialysis at this point  ANEMIA- drop in Hemoglobin. Patient is most likely  loosing blood . She may need a transfusion we can give a dose of aranesp  MBD- low phos diet   HTN/VOL- volume looks pretty euvolemic to me although there may be mild volume overload.     LOS: 1 Elgar Scoggins W @TODAY @6 :29 AM

## 2014-09-26 ENCOUNTER — Other Ambulatory Visit: Payer: Self-pay | Admitting: *Deleted

## 2014-09-26 ENCOUNTER — Encounter: Payer: Self-pay | Admitting: *Deleted

## 2014-09-26 DIAGNOSIS — A0472 Enterocolitis due to Clostridium difficile, not specified as recurrent: Secondary | ICD-10-CM | POA: Insufficient documentation

## 2014-09-26 DIAGNOSIS — I5042 Chronic combined systolic (congestive) and diastolic (congestive) heart failure: Secondary | ICD-10-CM

## 2014-09-26 DIAGNOSIS — D62 Acute posthemorrhagic anemia: Secondary | ICD-10-CM

## 2014-09-26 DIAGNOSIS — A047 Enterocolitis due to Clostridium difficile: Secondary | ICD-10-CM

## 2014-09-26 DIAGNOSIS — Z951 Presence of aortocoronary bypass graft: Secondary | ICD-10-CM

## 2014-09-26 DIAGNOSIS — J449 Chronic obstructive pulmonary disease, unspecified: Secondary | ICD-10-CM

## 2014-09-26 DIAGNOSIS — R079 Chest pain, unspecified: Secondary | ICD-10-CM

## 2014-09-26 DIAGNOSIS — E872 Acidosis: Secondary | ICD-10-CM

## 2014-09-26 DIAGNOSIS — I509 Heart failure, unspecified: Secondary | ICD-10-CM

## 2014-09-26 LAB — CBC
HEMATOCRIT: 25.1 % — AB (ref 36.0–46.0)
Hemoglobin: 8.4 g/dL — ABNORMAL LOW (ref 12.0–15.0)
MCH: 30 pg (ref 26.0–34.0)
MCHC: 33.5 g/dL (ref 30.0–36.0)
MCV: 89.6 fL (ref 78.0–100.0)
Platelets: 105 10*3/uL — ABNORMAL LOW (ref 150–400)
RBC: 2.8 MIL/uL — AB (ref 3.87–5.11)
RDW: 15 % (ref 11.5–15.5)
WBC: 6.3 10*3/uL (ref 4.0–10.5)

## 2014-09-26 LAB — BASIC METABOLIC PANEL
Anion gap: 11 (ref 5–15)
BUN: 38 mg/dL — AB (ref 6–20)
CO2: 19 mmol/L — AB (ref 22–32)
Calcium: 8.4 mg/dL — ABNORMAL LOW (ref 8.9–10.3)
Chloride: 110 mmol/L (ref 101–111)
Creatinine, Ser: 3.69 mg/dL — ABNORMAL HIGH (ref 0.44–1.00)
GFR calc Af Amer: 12 mL/min — ABNORMAL LOW (ref >60)
GFR, EST NON AFRICAN AMERICAN: 11 mL/min — AB (ref >60)
GLUCOSE: 120 mg/dL — AB (ref 65–99)
POTASSIUM: 4.1 mmol/L (ref 3.5–5.1)
Sodium: 140 mmol/L (ref 135–145)

## 2014-09-26 LAB — URINE CULTURE

## 2014-09-26 LAB — TYPE AND SCREEN
ABO/RH(D): B POS
ANTIBODY SCREEN: NEGATIVE
Unit division: 0

## 2014-09-26 LAB — GLUCOSE, CAPILLARY
GLUCOSE-CAPILLARY: 255 mg/dL — AB (ref 65–99)
GLUCOSE-CAPILLARY: 169 mg/dL — AB (ref 65–99)
Glucose-Capillary: 112 mg/dL — ABNORMAL HIGH (ref 65–99)
Glucose-Capillary: 96 mg/dL (ref 65–99)

## 2014-09-26 LAB — PTH, INTACT AND CALCIUM
Calcium, Total (PTH): 8.4 mg/dL — ABNORMAL LOW (ref 8.7–10.3)
PTH: 100 pg/mL — ABNORMAL HIGH (ref 15–65)

## 2014-09-26 LAB — CLOSTRIDIUM DIFFICILE BY PCR: Toxigenic C. Difficile by PCR: POSITIVE — AB

## 2014-09-26 LAB — VITAMIN D 25 HYDROXY (VIT D DEFICIENCY, FRACTURES): VIT D 25 HYDROXY: 16.5 ng/mL — AB (ref 30.0–100.0)

## 2014-09-26 MED ORDER — FOSFOMYCIN TROMETHAMINE 3 G PO PACK
3.0000 g | PACK | Freq: Once | ORAL | Status: AC
  Administered 2014-09-26: 3 g via ORAL
  Filled 2014-09-26 (×2): qty 3

## 2014-09-26 MED ORDER — METRONIDAZOLE 500 MG PO TABS
500.0000 mg | ORAL_TABLET | Freq: Three times a day (TID) | ORAL | Status: DC
  Administered 2014-09-26 – 2014-09-28 (×6): 500 mg via ORAL
  Filled 2014-09-26 (×9): qty 1

## 2014-09-26 MED ORDER — INSULIN GLARGINE 100 UNIT/ML ~~LOC~~ SOLN
5.0000 [IU] | Freq: Every day | SUBCUTANEOUS | Status: DC
  Administered 2014-09-26 – 2014-09-27 (×2): 5 [IU] via SUBCUTANEOUS
  Filled 2014-09-26 (×3): qty 0.05

## 2014-09-26 MED ORDER — VITAMIN D3 25 MCG (1000 UNIT) PO TABS
2000.0000 [IU] | ORAL_TABLET | Freq: Every day | ORAL | Status: DC
  Administered 2014-09-26 – 2014-09-28 (×3): 2000 [IU] via ORAL
  Filled 2014-09-26 (×3): qty 2

## 2014-09-26 NOTE — Progress Notes (Signed)
I spoke with daughter Jaclynn MajorMie who has been designated as a person who usually makes decisions for her mother's health care by several family members since admission. I summarized her mother's hospital course to her and also explained the importance of clarifying her mother's goals of care. I told her that right now while she is stable, we can only plan for the future when she is healthy and able to participate in such discussions. She reported to me that her mother had once remarked to her that she would never want to be intubated, but she reported to me that she will have further discussions with all family. I also explained to her that her mother would be a poor candidate for dialysis given her other comorbidities, and further discussions can be pursued in the outpatient setting when she follows up. I explained to her that that would be the best course of action to document her mother's wishes should she have her come to a point where we would need to reference them. She appreciated my time and acknowledged understanding.

## 2014-09-26 NOTE — Progress Notes (Signed)
Bianca [name]   Subjective:   Interval History: C Diff Colitis diagnosed  Objective:  Vital signs in last 24 hours:  Temp:  [98.1 F (36.7 C)-99.4 F (37.4 C)] 98.5 F (36.9 C) (05/26 1013) Pulse Rate:  [70-84] 72 (05/26 1013) Resp:  [18-20] 18 (05/26 1013) BP: (102-151)/(42-69) 151/69 mmHg (05/26 1013) SpO2:  [94 %-96 %] 96 % (05/26 1013) Weight:  [47.8 kg (105 lb 6.1 oz)] 47.8 kg (105 lb 6.1 oz) (05/26 0548)  Weight change: -1.7 kg (-3 lb 12 oz) Filed Weights   09/24/14 1658 09/25/14 0500 09/26/14 0548  Weight: 48.988 kg (108 lb) 47.6 kg (104 lb 15 oz) 47.8 kg (105 lb 6.1 oz)    Intake/Output: I/O last 3 completed shifts: In: 2723.7 [P.O.:2060; I.V.:78.7; Blood:335; IV Piggyback:250] Out: 1400 [Urine:1400]   Intake/Output this shift:  Total I/O In: 300 [P.O.:300] Out: 175 [Urine:175]  CVS- RRR RS- CTA ABD- BS present soft non-distended EXT- 1 + edema   Basic Metabolic Panel:  Recent Labs Lab 09/24/14 1101 09/25/14 0144 09/26/14 0412  NA 138 138 140  K 4.6 4.4 4.1  CL 110 108 110  CO2 18* 18* 19*  GLUCOSE 239* 153* 120*  BUN 42* 41* 38*  CREATININE 3.26* 3.47* 3.69*  CALCIUM 8.8* 8.6*  8.4* 8.4*  MG  --  2.1  --     Liver Function Tests:  Recent Labs Lab 09/24/14 1101  AST 27  ALT 33  ALKPHOS 71  BILITOT 0.6  PROT 6.5  ALBUMIN 3.4*   No results for input(s): LIPASE, AMYLASE in the last 168 hours. No results for input(s): AMMONIA in the last 168 hours.  CBC:  Recent Labs Lab 09/24/14 1101 09/25/14 0144 09/25/14 0428 09/25/14 1834 09/26/14 0412  WBC 8.6 8.0 7.7 6.5 6.3  HGB 8.2* 6.9* 6.9* 8.6* 8.4*  HCT 25.2* 20.9* 21.6* 25.1* 25.1*  MCV 91.3 91.7 91.9 89.6 89.6  PLT 118* 121* 124* 101* 105*    Cardiac Enzymes:  Recent Labs Lab 09/24/14 1409 09/24/14 2015 09/25/14 0129  TROPONINI <0.03 <0.03 <0.03    BNP: Invalid input(s): POCBNP  CBG:  Recent Labs Lab 09/25/14 0624 09/25/14 1151  09/25/14 1659 09/25/14 2215 09/26/14 0630  GLUCAP 169* 184* 211* 101* 112*    Microbiology: Results for orders placed or performed during the hospital encounter of 09/24/14  Clostridium Difficile by PCR     Status: Abnormal   Collection Time: 09/25/14  6:26 PM  Result Value Ref Range Status   C difficile by pcr POSITIVE (A) NEGATIVE Final    Comment: CRITICAL RESULT CALLED TO, READ BACK BY AND VERIFIED WITH: B QUAKENBUSH AT [id number] 09/26/14 L BENFIELD     Coagulation Studies: No results for input(s): LABPROT, INR in the last 72 hours.  Urinalysis:  Recent Labs  09/24/14 1245  COLORURINE YELLOW  LABSPEC 1.014  PHURINE 5.5  GLUCOSEU 500*  HGBUR NEGATIVE  BILIRUBINUR NEGATIVE  KETONESUR NEGATIVE  PROTEINUR >300*  UROBILINOGEN 0.2  NITRITE NEGATIVE  LEUKOCYTESUR TRACE*      Imaging: Dg Chest 2 View  09/24/2014   CLINICAL DATA:  Chest pain.  EXAM: CHEST  2 VIEW  COMPARISON:  August 23, 2014.  FINDINGS: Stable cardiomegaly. Status post coronary artery bypass graft. No pneumothorax or significant pleural effusion is noted. Mild central pulmonary vascular congestion is noted which is increased compared to prior exam. Probable minimal bilateral pulmonary edema is noted.  IMPRESSION: Mild central pulmonary vascular congestion is noted which  is increased compared to prior exam. Minimal to mild bilateral pulmonary edema is noted as well.   Electronically Signed   By: Lupita Raider, M.D.   On: 09/24/2014 11:42   Dg Chest Port 1 View  09/25/2014   CLINICAL DATA:  Dyspnea  EXAM: PORTABLE CHEST - 1 VIEW  COMPARISON:  09/24/2014  FINDINGS: Cardiomediastinal silhouette is stable. Status post CABG. Central mild vascular congestion and mild perihilar interstitial prominence without convincing pulmonary edema. No segmental infiltrate.  IMPRESSION: Status post CABG. Central mild vascular congestion and mild perihilar interstitial prominence without convincing pulmonary edema. No segmental  infiltrate.   Electronically Signed   By: Natasha Mead M.D.   On: 09/25/2014 08:34     Medications:     . sodium chloride   Intravenous Once  . amLODipine  10 mg Oral Daily  . aspirin EC  81 mg Oral Daily  . budesonide  2 mL Inhalation BID  . carvedilol  25 mg Oral BID WC  . docusate sodium  100 mg Oral BID  . hydrALAZINE  25 mg Oral TID  . insulin aspart  0-9 Units Subcutaneous TID WC  . ipratropium-albuterol  3 mL Nebulization Q6H  . isosorbide mononitrate  120 mg Oral Daily  . metroNIDAZOLE  500 mg Oral 3 times per day  . pantoprazole  40 mg Oral Daily  . piperacillin-tazobactam (ZOSYN)  IV  2.25 g Intravenous 3 times per day  . rosuvastatin  20 mg Oral q1800  . sodium chloride  3 mL Intravenous Q12H   acetaminophen, albuterol, guaiFENesin-dextromethorphan, nitroGLYCERIN, ondansetron (ZOFRAN) IV  Assessment/ Plan:   CKD 4 baseline creatinine about 3 There have been no plans to initiate dialysis and we shall need to have a discussion regarding goal of care. Luckily she is reponding to lasix and there are no emergent indications for dialysis at this point  ANEMIA- drop in Hemoglobin. Patient is most likely loosing blood .  Given Aranesp   Has infectious colitis  MBD- low phos diet   HTN/VOL- volume looks pretty euvolemic  No indications for dialysis   LOS: 2 Bianca [name]  :52 AM

## 2014-09-26 NOTE — Progress Notes (Signed)
Patient Name: Bianca [name] Date of Encounter: 09/26/2014  Cardiologist: Antoine Poche   Principal Problem:   Fluid overload Active Problems:   Hyperlipidemia   Essential hypertension   Hx of CABG '08, cath Aug 2012- med Rx   GERD   Exposure to hepatitis B   Back pain   Cardiomyopathy, ischemic   Chronic combined systolic and diastolic CHF (congestive heart failure)   Chest pain   CKD (chronic kidney disease) stage 5, GFR less than 15 ml/min   UTI (urinary tract infection)   Thrombocytopenia   Coronary artery disease involving autologous vein coronary bypass graft with other forms of angina pectoris   Type 2 diabetes mellitus with stage 5 chronic kidney disease   Dyspnea   SOB (shortness of breath)    SUBJECTIVE  Had chest pain, but only during cough. Has been coughing since Tue night. Also had onset of SOB x 4 days. Today feels her SOB improved. Her LE edema also disappeared.   CURRENT MEDS . sodium chloride   Intravenous Once  . amLODipine  10 mg Oral Daily  . aspirin EC  81 mg Oral Daily  . budesonide  2 mL Inhalation BID  . carvedilol  25 mg Oral BID WC  . docusate sodium  100 mg Oral BID  . hydrALAZINE  25 mg Oral TID  . insulin aspart  0-9 Units Subcutaneous TID WC  . ipratropium-albuterol  3 mL Nebulization Q6H  . isosorbide mononitrate  120 mg Oral Daily  . metroNIDAZOLE  500 mg Oral 3 times per day  . pantoprazole  40 mg Oral Daily  . piperacillin-tazobactam (ZOSYN)  IV  2.25 g Intravenous 3 times per day  . rosuvastatin  20 mg Oral q1800  . sodium chloride  3 mL Intravenous Q12H    OBJECTIVE  Filed Vitals:   09/25/14 2216 09/26/14 0251 09/26/14 0548 09/26/14 0903  BP: 132/66  136/66   Pulse: 71  81   Temp: 98.4 F (36.9 C)  98.9 F (37.2 C)   TempSrc: Oral  Oral   Resp: 18  18   Height:      Weight:   105 lb 6.1 oz (47.8 kg)   SpO2: 95% 95% 96% 96%    Intake/Output Summary (Last 24 hours) at 09/26/14 0959 Last data filed at 09/26/14 0700  Gross  per 24 hour  Intake 1943.66 ml  Output    300 ml  Net 1643.66 ml   Filed Weights   09/24/14 1658 09/25/14 0500 09/26/14 0548  Weight: 108 lb (48.988 kg) 104 lb 15 oz (47.6 kg) 105 lb 6.1 oz (47.8 kg)    PHYSICAL EXAM  General: Pleasant, NAD. Neuro: Alert and oriented X 3. Moves all extremities spontaneously. Psych: Normal affect. HEENT:  Normal  Neck: Supple without bruits or JVD. Lungs:  Resp regular and unlabored, CTA. Heart: RRR no s3, s4, or murmurs. Abdomen: Soft, non-tender, non-distended, BS + x 4.  Extremities: No clubbing, cyanosis or edema. DP/PT/Radials 2+ and equal bilaterally.  Accessory Clinical Findings  CBC  Recent Labs  09/25/14 1834 09/26/14 0412  WBC 6.5 6.3  HGB 8.6* 8.4*  HCT 25.1* 25.1*  MCV 89.6 89.6  PLT 101* 105*   Basic Metabolic Panel  Recent Labs  09/25/14 0144 09/26/14 0412  NA 138 140  K 4.4 4.1  CL 108 110  CO2 18* 19*  GLUCOSE 153* 120*  BUN 41* 38*  CREATININE 3.47* 3.69*  CALCIUM 8.6*  8.4* 8.4*  MG 2.1  --    Liver Function Tests  Recent Labs  09/24/14 1101  AST 27  ALT 33  ALKPHOS 71  BILITOT 0.6  PROT 6.5  ALBUMIN 3.4*   No results for input(s): LIPASE, AMYLASE in the last 72 hours. Cardiac Enzymes  Recent Labs  09/24/14 1409 09/24/14 2015 09/25/14 0129  TROPONINI <0.03 <0.03 <0.03    TELE NSR with HR 70-80s, 20 beats run of wide complex tachycardia felt to be a-fib with aberrancy    ECG  No new EKG  Echocardiogram 04/10/2014  LV EF: 40% -  45%  ------------------------------------------------------------------- Indications:   Chest pain 786.51.  ------------------------------------------------------------------- History:  PMH: Chronic Renal Failure. Hepatitis B. Dyspnea. Coronary artery disease. Risk factors: Hypertension. Diabetes mellitus. Dyslipidemia.  ------------------------------------------------------------------- Study Conclusions  - Left ventricle: The cavity  size was normal. Wall thickness was increased in a pattern of mild LVH. There was mild focal basal hypertrophy of the septum. Systolic function was mildly to moderately reduced. The estimated ejection fraction was in the range of 40% to 45%. Regional wall motion abnormalities cannot be excluded. Doppler parameters are consistent with high ventricular filling pressure. - Mitral valve: Calcified annulus. There was mild regurgitation. - Left atrium: The atrium was mildly dilated. - Tricuspid valve: There was moderate regurgitation. - Pulmonary arteries: PA peak pressure: 39 mm Hg (S).    Radiology/Studies  Dg Chest 2 View  09/24/2014   CLINICAL DATA:  Chest pain.  EXAM: CHEST  2 VIEW  COMPARISON:  August 23, 2014.  FINDINGS: Stable cardiomegaly. Status post coronary artery bypass graft. No pneumothorax or significant pleural effusion is noted. Mild central pulmonary vascular congestion is noted which is increased compared to prior exam. Probable minimal bilateral pulmonary edema is noted.  IMPRESSION: Mild central pulmonary vascular congestion is noted which is increased compared to prior exam. Minimal to mild bilateral pulmonary edema is noted as well.   Electronically Signed   By: Lupita RaiderJames  Green Jr, M.D.   On: 09/24/2014 11:42   Dg Chest Port 1 View  09/25/2014   CLINICAL DATA:  Dyspnea  EXAM: PORTABLE CHEST - 1 VIEW  COMPARISON:  09/24/2014  FINDINGS: Cardiomediastinal silhouette is stable. Status post CABG. Central mild vascular congestion and mild perihilar interstitial prominence without convincing pulmonary edema. No segmental infiltrate.  IMPRESSION: Status post CABG. Central mild vascular congestion and mild perihilar interstitial prominence without convincing pulmonary edema. No segmental infiltrate.   Electronically Signed   By: Natasha MeadLiviu  Pop M.D.   On: 09/25/2014 08:34    ASSESSMENT AND PLAN  81 yo [place] female presented with SOB and cough.  1. Acute on chronic combined  systolic and diastolic HF  - continue amlodipine, coreg, hydralazine, imdur  - not entirely sure if I/O is accurate, currently +500 ml since admission. Weight down from 109 to 105 which is likely her dry weight.  - physical exam shows no rale, no LE edema and no wheezing. She appears to be euvolemic. So far she only had 40mg  IV lasix 2 days ago and 20mg  IV lasix yesterday. It does not appear she is on home lasix dose. Given recurrent readmission for HF, not unreasonable for PRN 40mg  PO lasix at home as needed for SOB.  - likely can be discharged soon   2. Unstable angina, without high risk biochemical or ECG features suggest acute coronary syndrome  - no plan for cardiac cath at this point given high risk of contrast induced permanent renal failure  - overnight,  no obvious angina activity, states she only has chest pain overnight whenever she cough. Serial trop negative reassuring  3. Stage V CKD  - Cr worsened from 3.2 on arrival to 3.69 this morning. Euvolemic, will hold off on further diuresis.  4. CAD s/p CABG  - Echo 04/30/2014 EF 40-45%, mild MR, moderate TR, PA peak pressure .   - myoview 05/01/2014 EF 74% (likely not accurate given Echo around same), moderate dized infarct in mid inferior wall, no ischemia  5. Chronic anemia likely related to CKD  6. Positive c diff, possible infectious colitis, although pt denies any abdominal pain  7. PAF: has wide complex tachycardia around 3:04pm yesterday, however felt not to be vtach, but a-fib with aberrancy  - strip reviewed by Dr. Royann Shivers. Only 20 beats.   - CHA2DS2-Vasc score 6 (female, age, HF, HTN, DM)    - not a very ideal patient for systemic anticoagulation, continue ASA for now. Will discuss with MD. ?if benefit from 30 day event monitor to check for frequency and duration of AFib  Leonides Schanz Amedeo Plenty Pager: [phone]   Attending Note:   The patient was seen and examined.  Agree with assessment and plan as noted above.   Changes made to the above note as needed.  Pt seems to be stable   1. Acute on chronic CHF  :  In the setting of CKD stage V and anemia.  She is much better.   2. Paroxysmal atrial fib:  I think she is a relatively poor candidate for anticoagulation .  Would check stool quiac periodically while she is on asa.   3. CKD:  Agree that she is not likely a good dialysis candidate.  Could ask nephrology for an opinion .   Vesta Mixer, Montez Hageman., MD, [facility] 09/26/2014, 12:01 PM [address]  [address] Office [phone] Pager [phone]

## 2014-09-26 NOTE — Progress Notes (Signed)
Subjective: This AM, her daughter-in-law and daughter Rocky Crafts was at bedside. With the help of an interpreter by phone, we explained to the patient her hospital course and that her diarrhea was attributed to an infection called C. difficile. We explained that we would treat this infection with another antibiotic and have her stay inpatient for another day for close monitoring given her other comorbidities. She reports to [place] that her breathing has improved though she still has some chest pain following cough.  Objective: Vital signs in last 24 hours: Filed Vitals:   09/26/14 0251 09/26/14 0548 09/26/14 0903 09/26/14 1013  BP:  136/66  151/69  Pulse:  81  72  Temp:  98.9 F (37.2 C)  98.5 F (36.9 C)  TempSrc:  Oral  Oral  Resp:  18  18  Height:      Weight:  105 lb 6.1 oz (47.8 kg)    SpO2: 95% 96% 96% 96%   Weight change: -3 lb 12 oz (-1.7 kg)  Intake/Output Summary (Last 24 hours) at 09/26/14 1350 Last data filed at 09/26/14 1200  Gross per 24 hour  Intake 1803.16 ml  Output    325 ml  Net 1478.16 ml   General: Elderly [place] female, resting in bed, no acute distress HEENT: PERRL, EOMI, no scleral icterus, no JVD Cardiac: RRR, no rubs, murmurs or gallops Pulm: Clear to auscultation bilaterally with scant wheezing but no crackles Abd: soft, nontender, mildly distended, BS present Ext: warm and well perfused, no pitting edema noted on bilateral lower extremities  Lab Results: Basic Metabolic Panel:  Recent Labs Lab 09/25/14 0144 09/26/14 0412  NA 138 140  K 4.4 4.1  CL 108 110  CO2 18* 19*  GLUCOSE 153* 120*  BUN 41* 38*  CREATININE 3.47* 3.69*  CALCIUM 8.6*  8.4* 8.4*  MG 2.1  --    Liver Function Tests:  Recent Labs Lab 09/24/14 1101  AST 27  ALT 33  ALKPHOS 71  BILITOT 0.6  PROT 6.5  ALBUMIN 3.4*   CBC:  Recent Labs Lab 09/25/14 1834 09/26/14 0412  WBC 6.5 6.3  HGB 8.6* 8.4*  HCT 25.1* 25.1*  MCV 89.6 89.6  PLT 101* 105*   Cardiac  Enzymes:  Recent Labs Lab 09/24/14 1409 09/24/14 2015 09/25/14 0129  TROPONINI <0.03 <0.03 <0.03   CBG:  Recent Labs Lab 09/25/14 0624 09/25/14 1151 09/25/14 1659 09/25/14 2215 09/26/14 0630 09/26/14 1209  GLUCAP 169* 184* 211* 101* 112* 255*   Studies/Results: Dg Chest Port 1 View  09/25/2014   CLINICAL DATA:  Dyspnea  EXAM: PORTABLE CHEST - 1 VIEW  COMPARISON:  09/24/2014  FINDINGS: Cardiomediastinal silhouette is stable. Status post CABG. Central mild vascular congestion and mild perihilar interstitial prominence without convincing pulmonary edema. No segmental infiltrate.  IMPRESSION: Status post CABG. Central mild vascular congestion and mild perihilar interstitial prominence without convincing pulmonary edema. No segmental infiltrate.   Electronically Signed   By: Natasha Mead M.D.   On: 09/25/2014 08:34   Medications: I have reviewed the patient's current medications. Scheduled Meds: . sodium chloride   Intravenous Once  . amLODipine  10 mg Oral Daily  . aspirin EC  81 mg Oral Daily  . budesonide  2 mL Inhalation BID  . carvedilol  25 mg Oral BID WC  . docusate sodium  100 mg Oral BID  . fosfomycin  3 g Oral Once  . hydrALAZINE  25 mg Oral TID  . insulin aspart  0-9  Units Subcutaneous TID WC  . ipratropium-albuterol  3 mL Nebulization Q6H  . isosorbide mononitrate  120 mg Oral Daily  . metroNIDAZOLE  500 mg Oral 3 times per day  . pantoprazole  40 mg Oral Daily  . rosuvastatin  20 mg Oral q1800  . sodium chloride  3 mL Intravenous Q12H   Continuous Infusions:  PRN Meds:.acetaminophen, albuterol, guaiFENesin-dextromethorphan, nitroGLYCERIN, ondansetron (ZOFRAN) IV Assessment/Plan:  Ms. Vandenbrink is an 81 year old female with CKD Stage 5, CAD status post CABG, CHF, type 2 diabetes, hyperlipidemia, chronic normocytic anemia, asthma, GERD, constipation who presented with chest pain now with improved anemia and C. difficile colitis.  C. difficile colitis: Her diarrhea  persisted yesterday despite discontinuing MiraLAX, and C. difficile PCR was positive. Risk factors include multiple healthcare exposure, recent antibiotic use. No fever or leukocytosis is reassuring. -Start Flagyl 500 mg 3 times daily [day 1/10]  Acute on chronic normocytic anemia: Hemoglobin 8.4, stable from 8.6 following her transfusion of PRBC 1 yesterday, baseline 8-10 since December 2015, and she received Aranesp yesterday. No signs of active bleeding. Anemia panel 08/24/14 notable for low normal iron 45, normal ferritin 158, low TIBC 200 suggestive of anemia of chronic disease. -Recheck CBC  Chest pain: Likely multifactorial given her severe cardiac and renal disease along with respiratory disease though has markedly improved. She appeared much more comfortable this morning the reports pain after she coughs.  -Repeat BMET tomorrow morning -Monitor ins and outs -Continue daily weights -Schedule DuoNeb every 6 hours -Monitor on telemetry -Continue nitroglycerin sublingual prn for chest pain -Nephrology and cardiology following, appreciate recommendations  UTI with history of urinary retention: UA notable for cloudy appearance, trace leukocytes, many bacteria. She also reports symptoms consistent with an infection on admission though has not complained of symptoms since admission. She continues to produce urine with Lasix. -Transition Zosyn to fosfomycin to complete treatment -Follow-up urine culture   CKD Stage V complicated by non-gap metabolic acidosis: Creatinine 3.7 this morning, likely in response to her diuresis given baseline mostly 3-3.5 over this past year though has been steadily uptrending. Bicarbonate 19 this morning, 18 on admission which appears to be at her baseline since December 2015. She was last seen by her nephrologist, Dr. Lowell Guitar, on 07/31/14 with follow-up in 4 months. Renal ultrasound last month was otherwise reassuring notable only for bilateral renal cysts. -Consider  starting her on bicarbonate therapy  Metabolic bone disease: PTH elevated at 100, increased from 56 back in March when she last presented to her nephrologist office. Vitamin D low at 16.5 and would require roughly 2000 units daily to correct her vitamin D. Corrected calcium 9.4 on admission, phosphorus 4.5 in April. -Start cholecalciferol 2000 units daily  CHF: Echo 04/30/14 notable for EF 40-45% with mild LVH, mild focal basal hypertrophy of the septum. She also has a history of CABG. Her medications include Imdur 120 mg daily, Coreg 25 mg twice daily, aspirin 81 mg, nitroglycerin 0.4 mg every 5 minutes as needed for chest pain. -Continue home medications  Type 2 diabetes: Glucose trending mostly mid to high 100s with a few outliers in the 200s. A1c 6.9 when checked earlier this month though has been as high as 9.3 in February 2015. Her medications include Lantus 5 units at bedtime, NovoLog 8 units with breakfast, 8 units with lunch, 10 units with dinner. -Continue SSI-S -Restart home Lantus 5u QHS  Hypertension: Currently normotensive. Home medications include Coreg 25 mg, hydralazine 25 mg 3 times daily, amlodipine 10 mg. -  Continue home medications  Hyperlipidemia: Most recent lipid panel in February 2015 notable for total cholesterol 218, HDL 65, LDL 106, triglycerides 235. Her medications include Crestor 20 mg. -Continue home medications  Presumed chronic hepatitis B infection: Per chart review, appears that she had a healthcare exposure to HBV after hospitalization in April. Labs checked at office visit 5/10 notable for reactive HBV core antibody, elevated HBV DNA, positive HBVe antibody, unreactive HBV surface antibody, reactive HBV surface antigen which is suggestive of acute infection though unclear if surface antibody is IgM or IgG. LFTs reassuring on admission. -Continue assessing  COPD Stage II: PFTs 07/09/14 with FEV1/FVC 84% and post bronchodilator FEV1 66%. Her medications include  albuterol 1 puff every 6 hours as needed for wheezing, Flovent 1 puff twice daily. -Continue home medications -DuoNeb's as noted above  GERD: Home medications include Prilosec 20 mg. -Continue Protonix 40 mg daily  Constipation: Continue MiraLAX 3 times daily.  #FEN:  -Diet: Renal with fluid restriction  #DVT prophylaxis: Heparin   #CODE STATUS: FULL CODE -Defer to [place] if patients lacks decision-making capacity -Confirmed with patient on admission  Dispo: Disposition is deferred at this time, awaiting improvement of current medical problems.   The patient does have a current PCP (Carly Arlyce HarmanJ Rivet, MD) and [facility] after discharge.  The patient does have transportation limitations that hinder transportation to clinic appointments.  .Services Needed at time of discharge: Y = Yes, Blank = No PT:   OT:   RN:   Equipment:   Other:     LOS: 2 days   Beather Arbourushil Phyllicia Dudek V, MD 09/26/2014, 1:50 PM

## 2014-09-26 NOTE — Progress Notes (Signed)
CRITICAL VALUE ALERT  Critical value received:  Positive c.diff   Date of notification:  09/26/2014  Time of notification:  0844  Critical value read back:Yes.    Nurse who received alert:  Bernadene BellBlair Novia Lansberry, RN  MD notified (1st page):  Dr. Allena KatzPatel  Time of first page:  [phone]  MD notified (2nd page):  Time of second page:  Responding MD:  Dr. Allena KatzPatel  Time MD responded:  [phone]

## 2014-09-26 NOTE — Patient Outreach (Addendum)
Notified by telephonic care manager, L. Kathrynn RunningManning, that member is [facility].  Member is no longer eligible for services provided by [facility].    Call placed to Dondra Spryiffany Kellam, case manager with the new agency, to discuss member's case and to provide a report.  Ms. Myna BrightKellam has no further questions and states that she will follow up with member once discharged from hospital.  Encouraged to contact this care manager if she has any questions.  Will proceed with case closure.  Kemper DurieMonica Talullah Abate, BSN, [facility]  [facility] [phone]

## 2014-09-26 NOTE — Progress Notes (Signed)
PHARMACIST - PHYSICIAN COMMUNICATION  CONCERNING:  Protonix / Prilosec PTA   RECOMMENDATION: Consider stopping Protonix since she is now C diff positive  Thank you. Okey RegalLisa Saud Bail, PharmD [phone]

## 2014-09-27 ENCOUNTER — Inpatient Hospital Stay (HOSPITAL_COMMUNITY): Payer: Medicare Other

## 2014-09-27 ENCOUNTER — Other Ambulatory Visit: Payer: Self-pay

## 2014-09-27 DIAGNOSIS — N185 Chronic kidney disease, stage 5: Secondary | ICD-10-CM

## 2014-09-27 DIAGNOSIS — E860 Dehydration: Secondary | ICD-10-CM

## 2014-09-27 DIAGNOSIS — I5023 Acute on chronic systolic (congestive) heart failure: Secondary | ICD-10-CM

## 2014-09-27 DIAGNOSIS — I25718 Atherosclerosis of autologous vein coronary artery bypass graft(s) with other forms of angina pectoris: Secondary | ICD-10-CM

## 2014-09-27 DIAGNOSIS — I4891 Unspecified atrial fibrillation: Secondary | ICD-10-CM

## 2014-09-27 DIAGNOSIS — I509 Heart failure, unspecified: Secondary | ICD-10-CM

## 2014-09-27 DIAGNOSIS — I480 Paroxysmal atrial fibrillation: Secondary | ICD-10-CM | POA: Insufficient documentation

## 2014-09-27 LAB — RENAL FUNCTION PANEL
Albumin: 3.3 g/dL — ABNORMAL LOW (ref 3.5–5.0)
Anion gap: 14 (ref 5–15)
BUN: 40 mg/dL — AB (ref 6–20)
CALCIUM: 8.9 mg/dL (ref 8.9–10.3)
CHLORIDE: 107 mmol/L (ref 101–111)
CO2: 17 mmol/L — ABNORMAL LOW (ref 22–32)
CREATININE: 4.55 mg/dL — AB (ref 0.44–1.00)
GFR calc non Af Amer: 8 mL/min — ABNORMAL LOW (ref >60)
GFR, EST AFRICAN AMERICAN: 10 mL/min — AB (ref >60)
Glucose, Bld: 181 mg/dL — ABNORMAL HIGH (ref 65–99)
PHOSPHORUS: 4 mg/dL (ref 2.5–4.6)
Potassium: 3.8 mmol/L (ref 3.5–5.1)
Sodium: 138 mmol/L (ref 135–145)

## 2014-09-27 LAB — CBC
HCT: 30.1 % — ABNORMAL LOW (ref 36.0–46.0)
HCT: 31 % — ABNORMAL LOW (ref 36.0–46.0)
Hemoglobin: 10 g/dL — ABNORMAL LOW (ref 12.0–15.0)
Hemoglobin: 10.4 g/dL — ABNORMAL LOW (ref 12.0–15.0)
MCH: 29.8 pg (ref 26.0–34.0)
MCH: 29.9 pg (ref 26.0–34.0)
MCHC: 33.2 g/dL (ref 30.0–36.0)
MCHC: 33.5 g/dL (ref 30.0–36.0)
MCV: 89.6 fL (ref 78.0–100.0)
MCV: 89.1 fL (ref 78.0–100.0)
PLATELETS: 122 10*3/uL — AB (ref 150–400)
PLATELETS: 128 10*3/uL — AB (ref 150–400)
RBC: 3.36 MIL/uL — ABNORMAL LOW (ref 3.87–5.11)
RBC: 3.48 MIL/uL — AB (ref 3.87–5.11)
RDW: 14.5 % (ref 11.5–15.5)
RDW: 14.3 % (ref 11.5–15.5)
WBC: 7.2 10*3/uL (ref 4.0–10.5)
WBC: 7.8 10*3/uL (ref 4.0–10.5)

## 2014-09-27 LAB — GLUCOSE, CAPILLARY
GLUCOSE-CAPILLARY: 166 mg/dL — AB (ref 65–99)
GLUCOSE-CAPILLARY: 100 mg/dL — AB (ref 65–99)
Glucose-Capillary: 224 mg/dL — ABNORMAL HIGH (ref 65–99)
Glucose-Capillary: 191 mg/dL — ABNORMAL HIGH (ref 65–99)

## 2014-09-27 LAB — MAGNESIUM: MAGNESIUM: 2 mg/dL (ref 1.7–2.4)

## 2014-09-27 LAB — TROPONIN I
TROPONIN I: 0.03 ng/mL (ref <0.031)
Troponin I: 0.03 ng/mL (ref <0.031)

## 2014-09-27 MED ORDER — HEPARIN (PORCINE) IN NACL 100-0.45 UNIT/ML-% IJ SOLN
750.0000 [IU]/h | INTRAMUSCULAR | Status: DC
  Administered 2014-09-27: 750 [IU]/h via INTRAVENOUS
  Filled 2014-09-27: qty 250

## 2014-09-27 MED ORDER — SODIUM CHLORIDE 0.9 % IV BOLUS (SEPSIS)
250.0000 mL | Freq: Once | INTRAVENOUS | Status: AC
  Administered 2014-09-27: 250 mL via INTRAVENOUS

## 2014-09-27 MED ORDER — HEPARIN BOLUS VIA INFUSION
2000.0000 [IU] | Freq: Once | INTRAVENOUS | Status: DC
  Filled 2014-09-27: qty 2000

## 2014-09-27 MED ORDER — SODIUM BICARBONATE 650 MG PO TABS
650.0000 mg | ORAL_TABLET | Freq: Two times a day (BID) | ORAL | Status: DC
  Administered 2014-09-27 – 2014-09-28 (×3): 650 mg via ORAL
  Filled 2014-09-27 (×5): qty 1

## 2014-09-27 MED ORDER — HEPARIN (PORCINE) IN NACL 100-0.45 UNIT/ML-% IJ SOLN
750.0000 [IU]/h | INTRAMUSCULAR | Status: DC
  Filled 2014-09-27: qty 250

## 2014-09-27 MED ORDER — INSULIN ASPART 100 UNIT/ML ~~LOC~~ SOLN
0.0000 [IU] | Freq: Three times a day (TID) | SUBCUTANEOUS | Status: DC
  Administered 2014-09-28: 5 [IU] via SUBCUTANEOUS

## 2014-09-27 MED ORDER — HEPARIN BOLUS VIA INFUSION
2000.0000 [IU] | Freq: Once | INTRAVENOUS | Status: AC
  Administered 2014-09-27: 2000 [IU] via INTRAVENOUS
  Filled 2014-09-27: qty 2000

## 2014-09-27 NOTE — Progress Notes (Signed)
ANTICOAGULATION CONSULT NOTE - Initial Consult  Pharmacy Consult for Heparin Indication: atrial fibrillation  Allergies  Allergen Reactions  . Cozaar Other (See Comments)    Sig increase in her creatinine that resolved once it was D/C'd  Per Cr visible in epic, it seems her baseline Cr = 1.7-1.9, and the slight improvement to Cr was an outlier when compared to recent Cr.  We will do a trial of Lisinopril (and monitor for 30% rise in Cr), as she would greatly benefit from renal protection.  . Lisinopril     Cough     Patient Measurements: Height: 5\' 1"  (154.9 cm) Weight: 105 lb 6.1 oz (47.8 kg) IBW/kg (Calculated) : 47.8 Heparin Dosing Weight: 47.8 kg  Vital Signs: Temp: 98.3 F (36.8 C) (05/26 2100) Temp Source: Oral (05/26 2100) BP: 147/60 mmHg (05/26 2149) Pulse Rate: 75 (05/26 2100)  Labs:  Recent Labs  09/24/14 1101 09/24/14 1409 09/24/14 2015 09/25/14 0129 09/25/14 0144 09/25/14 0428 09/25/14 1834 09/26/14 0412  HGB 8.2*  --   --   --  6.9* 6.9* 8.6* 8.4*  HCT 25.2*  --   --   --  20.9* 21.6* 25.1* 25.1*  PLT 118*  --   --   --  121* 124* 101* 105*  CREATININE 3.26*  --   --   --  3.47*  --   --  3.69*  TROPONINI  --  <0.03 <0.03 <0.03  --   --   --   --     Estimated Creatinine Clearance: 9 mL/min (by C-G formula based on Cr of 3.69).   Medical History: Past Medical History  Diagnosis Date  . GERD (gastroesophageal reflux disease)   . Hepatitis B   . Hyperlipemia   . Hypertension   . Cervical dysplasia   . Atrophic vaginitis   . Spinal stenosis     s/p decompression  . CAD (coronary artery disease) April 2008    s/p CABG;    cath 8/12:  LM patent, pLAD occluded, OM1 30-40%, pOM2 80% and 60% after the anastomosis, pRCA occluded, S-DX occluded (old), S-OM 2 occluded (new), L-LAD patent, dLAD provided collats to the PDA; Med Rx  rec.; consider PCI to OM2 if fails med Rx  . Hx of hysterectomy     daughter, [name], denies this hx on 08/23/2014  . UTI  (lower urinary tract infection)   . Diverticulosis of intestine 04/29/2014    Of ascending colon noted on CT abd/ pelvis 04/20/14  . Diabetes mellitus, type 2     insulin dependent  . COPD (chronic obstructive pulmonary disease)     Hattie Perch/notes 08/23/2014  . Chronic kidney disease (CKD), stage IV (severe)     Hattie Perch/notes 08/23/2014  . CHF (congestive heart failure)     Hattie Perch/notes 08/23/2014  . Chronic lower back pain   . Shortness of breath dyspnea   . UTI (urinary tract infection) 09/2014    Medications:  Prescriptions prior to admission  Medication Sig Dispense Refill Last Dose  . acetaminophen (TYLENOL) 500 MG tablet Take 2 tablets (1,000 mg total) by mouth every 8 (eight) hours as needed for mild pain. 100 tablet 2 09/23/2014 at Unknown time  . amLODipine (NORVASC) 10 MG tablet Take 1 tablet (10 mg total) by mouth daily. 90 tablet 1 09/23/2014 at Unknown time  . aspirin EC 81 MG tablet TAKE 1 TABLET BY MOUTH EVERY DAY 150 tablet 1 09/23/2014 at Unknown time  . B-D ULTRAFINE III SHORT PEN  31G X 8 MM MISC USE AS DIRECTED TO INJECT INSULIN 3 TIMES A DAY 100 each 6 unknown  . BAYER MICROLET LANCETS lancets Use as instructed 200 each 5 unknown  . carvedilol (COREG) 25 MG tablet TAKE 1 TABLET (25 MG TOTAL) BY MOUTH 2 (TWO) TIMES DAILY WITH A MEAL. 120 tablet 3 09/23/2014 at 2000  . CRESTOR 20 MG tablet TAKE 1 TABLET (20 MG TOTAL) BY MOUTH DAILY. 90 tablet 3 09/23/2014 at Unknown time  . docusate sodium (COLACE) 100 MG capsule Take 100 mg by mouth 2 (two) times daily.   09/23/2014 at Unknown time  . fluticasone (FLOVENT HFA) 44 MCG/ACT inhaler Inhale 1 puff into the lungs 2 (two) times daily. 1 Inhaler 12 09/23/2014 at Unknown time  . glucose blood (BAYER CONTOUR NEXT TEST) test strip 1 each by Other route as needed for other. Use 4 to 5 times daily to check blood sugar. diag code E11.9. Insulin dependent 200 each 5 unknown  . hydrALAZINE (APRESOLINE) 25 MG tablet Take 1 tablet (25 mg total) by mouth 3 (three)  times daily. 270 tablet 5 09/23/2014 at Unknown time  . HYDROcodone-acetaminophen (NORCO/VICODIN) 5-325 MG per tablet Take 2 tablets by mouth every 4 (four) hours as needed. 12 tablet 0 Past Week at Unknown time  . insulin aspart (NOVOLOG FLEXPEN) 100 UNIT/ML FlexPen Please take 8 units with breakfast, 8 units with lunch, and 10 units with dinner/supper 100 mL 9 09/23/2014 at Unknown time  . insulin glargine (LANTUS) 100 UNIT/ML injection Inject 0.05 mLs (5 Units total) into the skin at bedtime. 10 mL 5 09/23/2014 at Unknown time  . isosorbide mononitrate (IMDUR) 120 MG 24 hr tablet Take 120 mg by mouth daily.  1 09/23/2014 at Unknown time  . nitroGLYCERIN (NITROSTAT) 0.4 MG SL tablet Place 0.4 mg under the tongue every 5 (five) minutes as needed. For chest pain. Maximum of 3 doses   unknown  . omeprazole (PRILOSEC) 20 MG capsule TAKE 1 CAPSULE (20 MG TOTAL) BY MOUTH DAILY. 30 capsule 0 09/23/2014 at Unknown time  . polyethylene glycol (MIRALAX) packet Take 17 g by mouth 3 (three) times daily. 100 each 0 09/23/2014 at Unknown time  . PROAIR HFA 108 (90 BASE) MCG/ACT inhaler INHALE 1 PUFF EVERY 6 HOURS AS NEEDED FOR WHEEZE 8.5 each 2 09/23/2014 at Unknown time  . Pseudoeph-Doxylamine-DM-APAP (NYQUIL PO) Take 1 Dose by mouth once.   09/24/2014 at Unknown time  . calcium citrate-vitamin D (CITRACAL+D) 315-200 MG-UNIT per tablet Take 2 tablets by mouth 2 (two) times daily. (Patient not taking: Reported on 08/05/2014) 100 tablet 3 Not Taking at Unknown time  . diclofenac sodium (VOLTAREN) 1 % GEL Apply 2 g topically 4 (four) times daily. (Patient not taking: Reported on 08/23/2014) 100 g 0 Not Taking at Unknown time  . sennosides-docusate sodium (SENOKOT-S) 8.6-50 MG tablet Take 1 tablet by mouth daily. (Patient not taking: Reported on 08/05/2014) 90 tablet 3 Not Taking at Unknown time   Scheduled:  . sodium chloride   Intravenous Once  . amLODipine  10 mg Oral Daily  . aspirin EC  81 mg Oral Daily  . budesonide  2  mL Inhalation BID  . carvedilol  25 mg Oral BID WC  . cholecalciferol  2,000 Units Oral Daily  . docusate sodium  100 mg Oral BID  . hydrALAZINE  25 mg Oral TID  . insulin aspart  0-9 Units Subcutaneous TID WC  . insulin glargine  5 Units Subcutaneous QHS  .  ipratropium-albuterol  3 mL Nebulization Q6H  . isosorbide mononitrate  120 mg Oral Daily  . metroNIDAZOLE  500 mg Oral 3 times per day  . pantoprazole  40 mg Oral Daily  . rosuvastatin  20 mg Oral q1800  . sodium chloride  3 mL Intravenous Q12H   Infusions:    Assessment: 81yo female found in Afib early this morning with HR in 80s. Pharmacy is consulted to dose heparin for atrial fibrillation. Hgb 8.4, Plt 105, sCr 3.7.  Goal of Therapy:  Heparin level 0.3-0.7 units/ml Monitor platelets by anticoagulation protocol: Yes   Plan:  Give 2000 units bolus x 1 Start heparin infusion at 750 units/hr Check anti-Xa level in 8 hours and daily while on heparin Continue to monitor H&H and platelets  Arlean Hopping. Newman Pies, PharmD Clinical Pharmacist Pager [phone] 09/27/2014,2:47 AM

## 2014-09-27 NOTE — Progress Notes (Signed)
New order for heparin gtt. Per MD wait until CBC drawn to give heparin, start heparin gtt within the hour if not resulted yet, notify MD if Hgb less than 8.

## 2014-09-27 NOTE — Progress Notes (Signed)
Subjective: This morning, her daughter Bianca [name] was present at bedside. Alongside with the interpreter Lance Sell, we discussed what was important to the patient in which she prioritized and she acknowledged that she would want to be kept comfortable at all costs. She enjoys being functional at home and agreed that she would not want CPR or intubation should she decompensate. We also discussed her bleeding risk with anticoagulation given her new onset atrial fibrillation, and her daughter acknowledged that she had a history of falls at home. Given her history of falls and her baseline anemia of chronic disease, we acknowledged anticoagulation in the long run may risk her having a stroke but would be much more beneficial to her comfort and quality of life and having to suffer an adverse bleed. Bianca [name] would like for [place] to meet with the rest of her siblings on Sunday when all of them had their shops closed.   Objective: Vital signs in last 24 hours: Filed Vitals:   09/27/14 0248 09/27/14 0349 09/27/14 0548 09/27/14 0804  BP: 143/70  133/70   Pulse: 93  89   Temp: 98.3 F (36.8 C)  99 F (37.2 C)   TempSrc: Oral  Oral   Resp: 16  16   Height:      Weight:  103 lb 12.8 oz (47.083 kg)    SpO2: 97%  99% 96%   Weight change: -1 lb 9.3 oz (-0.717 kg)  Intake/Output Summary (Last 24 hours) at 09/27/14 1228 Last data filed at 09/27/14 1123  Gross per 24 hour  Intake   1165 ml  Output    801 ml  Net    364 ml   General: Elderly [place] female, resting in bed, no acute distress HEENT: PERRL, EOMI, no scleral icterus, no JVD Cardiac: RRR, no rubs, murmurs or gallops Pulm: Clear to auscultation bilaterally with scant wheezing but no crackles Abd: soft, nontender, mildly distended, BS present Ext: warm and well perfused, no pitting edema noted on bilateral lower extremities  Lab Results: Basic Metabolic Panel:  Recent Labs Lab 09/25/14 0144 09/26/14 0412 09/27/14 0329  NA 138 140 138  K  4.4 4.1 3.8  CL 108 110 107  CO2 18* 19* 17*  GLUCOSE 153* 120* 181*  BUN 41* 38* 40*  CREATININE 3.47* 3.69* 4.55*  CALCIUM 8.6*  8.4* 8.4* 8.9  MG 2.1  --  2.0  PHOS  --   --  4.0   Liver Function Tests:  Recent Labs Lab 09/24/14 1101 09/27/14 0329  AST 27  --   ALT 33  --   ALKPHOS 71  --   BILITOT 0.6  --   PROT 6.5  --   ALBUMIN 3.4* 3.3*   CBC:  Recent Labs Lab 09/27/14 0320 09/27/14 0836  WBC 7.2 7.8  HGB 10.0* 10.4*  HCT 30.1* 31.0*  MCV 89.6 89.1  PLT 122* 128*   Cardiac Enzymes:  Recent Labs Lab 09/25/14 0129 09/27/14 0320 09/27/14 0836  TROPONINI <0.03 0.03 <0.03   CBG:  Recent Labs Lab 09/26/14 0630 09/26/14 1209 09/26/14 1646 09/26/14 2116 09/27/14 0508 09/27/14 1134  GLUCAP 112* 255* 96 169* 166* 224*   Studies/Results: No results found. Medications: I have reviewed the patient's current medications. Scheduled Meds: . sodium chloride   Intravenous Once  . amLODipine  10 mg Oral Daily  . aspirin EC  81 mg Oral Daily  . budesonide  2 mL Inhalation BID  . carvedilol  25 mg Oral BID  WC  . cholecalciferol  2,000 Units Oral Daily  . docusate sodium  100 mg Oral BID  . hydrALAZINE  25 mg Oral TID  . insulin aspart  0-9 Units Subcutaneous TID WC  . insulin glargine  5 Units Subcutaneous QHS  . ipratropium-albuterol  3 mL Nebulization Q6H  . isosorbide mononitrate  120 mg Oral Daily  . metroNIDAZOLE  500 mg Oral 3 times per day  . pantoprazole  40 mg Oral Daily  . rosuvastatin  20 mg Oral q1800  . sodium chloride  250 mL Intravenous Once  . sodium chloride  3 mL Intravenous Q12H   Continuous Infusions:  PRN Meds:.acetaminophen, albuterol, guaiFENesin-dextromethorphan, nitroGLYCERIN, ondansetron (ZOFRAN) IV Assessment/Plan:  [name] is an 81 year old female with CKD Stage 5, CAD status post CABG, CHF, type 2 diabetes, hyperlipidemia, chronic normocytic anemia, asthma, GERD, constipation who presented with chest pain now with  improved anemia and C. difficile colitis now with elevated creatinine and new onset paroxysmal atrial fibrillation.  New-onset paroxysmal atrial fibrillation: Presumed to be in the setting of acute diarrheal illness superimposed on her other comorbidities. CHADSVASC 7 given her other comorbidities and age though HAS-BLED score 5. Given the risks of CVA versus bleeding, anticoagulation was deferred as noted above. Cardiology is in agreement with continuing her aspirin. -Continuing aspirin 81 mg -Continue telemetry -Cardiology following, appreciate recommendations  CKD Stage V complicated by non-gap metabolic acidosis: Creatinine 4.6 this morning, up from 3.7 yesterday given baseline mostly 3-3.5 over this past year though has been steadily uptrending. Possibly in the setting of her acute diarrheal illness she had 4 bowel movements documented. Bicarbonate 17 this morning, 18 on admission which appears to be at her baseline since December 2015. She was last seen by her nephrologist, Dr. Lowell GuitarPowell, on 07/31/14 with follow-up in 4 months. Renal ultrasound last month was otherwise reassuring notable only for bilateral renal cysts. -Give 250cc bolus -Consider starting her on bicarbonate therapy though not noted by Renal per their recommendations  C. difficile colitis: C. difficile PCR was positive on 5/25. Risk factors include multiple healthcare exposure, recent antibiotic use. No fever or leukocytosis is reassuring. -Start Flagyl 500 mg 3 times daily [day 2/10]  Acute on chronic normocytic anemia: Hemoglobin 10.0, up from 9 yesterday though presumably from hemoconcentration, baseline 8-10 since December 2015, and she received Aranesp yesterday. No signs of active bleeding. Anemia panel 08/24/14 notable for low normal iron 45, normal ferritin 158, low TIBC 200 suggestive of anemia of chronic disease. -Recheck CBC  Chest pain: Likely multifactorial given her severe cardiac and renal disease along with  respiratory disease though has markedly improved. She only reports pain after she coughs.  -Repeat BMET tomorrow morning -Monitor ins and outs -Continue daily weights -Schedule DuoNeb every 6 hours -Monitor on telemetry -Continue nitroglycerin sublingual prn for chest pain -Nephrology and cardiology following, appreciate recommendations  UTI with history of urinary retention: UA notable for cloudy appearance, trace leukocytes, many bacteria on admission. She only reports minor discomfort with urination that was treated adequately. -Follow-up urine culture   Metabolic bone disease: PTH elevated at 100, increased from 56 back in March when she last presented to her nephrologist office. Vitamin D low at 16.5 and would require roughly 2000 units daily to correct her vitamin D. Corrected calcium 9.4 on admission, phosphorus 4.5 in April. -Start cholecalciferol 2000 units daily  CHF: Echo 04/30/14 notable for EF 40-45% with mild LVH, mild focal basal hypertrophy of the septum. She also has a  history of CABG. Her medications include Imdur 120 mg daily, Coreg 25 mg twice daily, aspirin 81 mg, nitroglycerin 0.4 mg every 5 minutes as needed for chest pain. -Continue home medications  Type 2 diabetes: Glucose trending mostly mid to high 100s with a few outliers in the 200s, similar to yesterday. A1c 6.9 when checked earlier this month though has been as high as 9.3 in February 2015. Her medications include Lantus 5 units at bedtime, NovoLog 8 units with breakfast, 8 units with lunch, 10 units with dinner. -Changed to moderate sliding scale insulin -Continue Lantus 5u QHS  Hypertension: Currently normotensive. Home medications include Coreg 25 mg, hydralazine 25 mg 3 times daily, amlodipine 10 mg. -Continue home medications  Hyperlipidemia: Most recent lipid panel in February 2015 notable for total cholesterol 218, HDL 65, LDL 106, triglycerides 235. Her medications include Crestor 20 mg. -Continue  home medications  Presumed chronic hepatitis B infection: Per chart review, appears that she had a healthcare exposure to HBV after hospitalization in April. Labs checked at office visit 5/10 notable for reactive HBV core antibody, elevated HBV DNA, positive HBVe antibody, unreactive HBV surface antibody, reactive HBV surface antigen which is suggestive of acute infection though unclear if surface antibody is IgM or IgG. LFTs reassuring on admission. -Continue assessing  COPD Stage II: PFTs 07/09/14 with FEV1/FVC 84% and post bronchodilator FEV1 66%. Her medications include albuterol 1 puff every 6 hours as needed for wheezing, Flovent 1 puff twice daily. -Continue home medications -DuoNeb's as noted above  GERD: Home medications include Prilosec 20 mg. -Continue Protonix 40 mg daily  Constipation: Holding her medications in the setting of diarrhea.  #FEN:  -Diet: Renal with fluid restriction  #DVT prophylaxis: Heparin   #CODE STATUS: DNR/DNI -Defer to granddaughter Mie if patients lacks decision-making capacity -Confirmed with patient and daughter Bianca [name] today   Dispo: Disposition is deferred at this time, awaiting improvement of current medical problems.   The patient does have a current PCP (Carly Arlyce Harman, MD) and does need an [facility] hospital follow-up appointment after discharge.  The patient does have transportation limitations that hinder transportation to clinic appointments.  .Services Needed at time of discharge: Y = Yes, Blank = No PT:   OT:   RN:   Equipment:   Other:     LOS: 3 days   [name], MD 09/27/2014, 12:28 PM

## 2014-09-27 NOTE — Progress Notes (Addendum)
Called by RN that [name] was in atrial fibrillation. Ordered EKG which confirms she was in a fib with HR 80s. On exam she was irregularly irregular but not tachycardic, some wheezing on exam, no LE edema. She says her only complaint is cough with some chest pain only when she coughs. On review of telemetry, she has been in atrial fibrillation for about an hour but was back in sinus after initial exam. Went back to bedside and back in a fib. Review of cardiology note says she is not a candidate for anti-coagulation. We will hold breathing treatment for now to not make her tachycardic. Will continue to monitor and advise RN to contact if HR sustained >110 bpm.  Farley LyAdam Kathie Posa, MD Internal Medicine, PGY-1 Pager [phone] 09/27/2014, 2:25 AM   ADDENDUM: Jeanene Erballed daughter Larey BrickMari but no answer. Called daughter Mie who answered. I explained the risks and benefits of anti-coagulation in her mother, including that cardiology feels she is poor candidate, in the setting of intermittent atrial fibrillation. She has said that she wants her mother to be on anti-coagulation, understanding the risk of a bleed including in the setting of her mother's high fall risk. Will start heparin per pharmacy.   Farley LyAdam Carlie Solorzano, MD Internal Medicine, PGY-1 Pager [phone] 09/27/2014, 2:44 AM

## 2014-09-27 NOTE — Progress Notes (Signed)
Patient Name: Bianca [name] Date of Encounter: 09/27/2014  Cardiologist: Antoine PocheHochrein   Principal Problem:   Fluid overload Active Problems:   Hyperlipidemia   Essential hypertension   Hx of CABG '08, cath Aug 2012- med Rx   GERD   Exposure to hepatitis B   Back pain   Cardiomyopathy, ischemic   Chronic combined systolic and diastolic CHF (congestive heart failure)   Chest pain   CKD (chronic kidney disease) stage 5, GFR less than 15 ml/min   UTI (urinary tract infection)   Thrombocytopenia   Coronary artery disease involving autologous vein coronary bypass graft with other forms of angina pectoris   Type 2 diabetes mellitus with stage 5 chronic kidney disease   Dyspnea   SOB (shortness of breath)   UTI (lower urinary tract infection)   C. difficile colitis    SUBJECTIVE  Had chest pain, but mainly during cough. Had palpitation last night. No SOB.  CURRENT MEDS . sodium chloride   Intravenous Once  . amLODipine  10 mg Oral Daily  . aspirin EC  81 mg Oral Daily  . budesonide  2 mL Inhalation BID  . carvedilol  25 mg Oral BID WC  . cholecalciferol  2,000 Units Oral Daily  . docusate sodium  100 mg Oral BID  . hydrALAZINE  25 mg Oral TID  . insulin aspart  0-9 Units Subcutaneous TID WC  . insulin glargine  5 Units Subcutaneous QHS  . ipratropium-albuterol  3 mL Nebulization Q6H  . isosorbide mononitrate  120 mg Oral Daily  . metroNIDAZOLE  500 mg Oral 3 times per day  . pantoprazole  40 mg Oral Daily  . rosuvastatin  20 mg Oral q1800  . sodium chloride  250 mL Intravenous Once  . sodium chloride  3 mL Intravenous Q12H    OBJECTIVE  Filed Vitals:   09/27/14 0248 09/27/14 0349 09/27/14 0548 09/27/14 0804  BP: 143/70  133/70   Pulse: 93  89   Temp: 98.3 F (36.8 C)  99 F (37.2 C)   TempSrc: Oral  Oral   Resp: 16  16   Height:      Weight:  103 lb 12.8 oz (47.083 kg)    SpO2: 97%  99% 96%    Intake/Output Summary (Last 24 hours) at 09/27/14 1017 Last data  filed at 09/27/14 0959  Gross per 24 hour  Intake   1225 ml  Output    976 ml  Net    249 ml   Filed Weights   09/25/14 0500 09/26/14 0548 09/27/14 0349  Weight: 104 lb 15 oz (47.6 kg) 105 lb 6.1 oz (47.8 kg) 103 lb 12.8 oz (47.083 kg)    PHYSICAL EXAM  General: Pleasant, NAD. Neuro: Alert and oriented X 3. Moves all extremities spontaneously. Psych: Normal affect. HEENT:  Normal  Neck: Supple without bruits or JVD. Lungs:  Resp regular and unlabored, CTA. Heart: RRR no s3, s4, or murmurs. Abdomen: Soft, non-tender, non-distended, BS + x 4.  Extremities: No clubbing, cyanosis or edema. DP/PT/Radials 2+ and equal bilaterally.  Accessory Clinical Findings  CBC  Recent Labs  09/27/14 0320 09/27/14 0836  WBC 7.2 7.8  HGB 10.0* 10.4*  HCT 30.1* 31.0*  MCV 89.6 89.1  PLT 122* 128*   Basic Metabolic Panel  Recent Labs  09/25/14 0144 09/26/14 0412 09/27/14 0329  NA 138 140 138  K 4.4 4.1 3.8  CL 108 110 107  CO2 18* 19* 17*  GLUCOSE 153* 120* 181*  BUN 41* 38* 40*  CREATININE 3.47* 3.69* 4.55*  CALCIUM 8.6*  8.4* 8.4* 8.9  MG 2.1  --  2.0  PHOS  --   --  4.0   Liver Function Tests  Recent Labs  09/24/14 1101 09/27/14 0329  AST 27  --   ALT 33  --   ALKPHOS 71  --   BILITOT 0.6  --   PROT 6.5  --   ALBUMIN 3.4* 3.3*   No results for input(s): LIPASE, AMYLASE in the last 72 hours. Cardiac Enzymes  Recent Labs  09/25/14 0129 09/27/14 0320 09/27/14 0836  TROPONINI <0.03 0.03 <0.03    TELE Recurrent a-fib last night, currently in NSR    ECG  A-fib,  Rate controlled   Echocardiogram 04/10/2014  LV EF: 40% -  45%  ------------------------------------------------------------------- Indications:   Chest pain 786.51.  ------------------------------------------------------------------- History:  PMH: Chronic Renal Failure. Hepatitis B. Dyspnea. Coronary artery disease. Risk factors: Hypertension. Diabetes mellitus.  Dyslipidemia.  ------------------------------------------------------------------- Study Conclusions  - Left ventricle: The cavity size was normal. Wall thickness was increased in a pattern of mild LVH. There was mild focal basal hypertrophy of the septum. Systolic function was mildly to moderately reduced. The estimated ejection fraction was in the range of 40% to 45%. Regional wall motion abnormalities cannot be excluded. Doppler parameters are consistent with high ventricular filling pressure. - Mitral valve: Calcified annulus. There was mild regurgitation. - Left atrium: The atrium was mildly dilated. - Tricuspid valve: There was moderate regurgitation. - Pulmonary arteries: PA peak pressure: 39 mm Hg (S).    Radiology/Studies  Dg Chest 2 View  09/24/2014   CLINICAL DATA:  Chest pain.  EXAM: CHEST  2 VIEW  COMPARISON:  August 23, 2014.  FINDINGS: Stable cardiomegaly. Status post coronary artery bypass graft. No pneumothorax or significant pleural effusion is noted. Mild central pulmonary vascular congestion is noted which is increased compared to prior exam. Probable minimal bilateral pulmonary edema is noted.  IMPRESSION: Mild central pulmonary vascular congestion is noted which is increased compared to prior exam. Minimal to mild bilateral pulmonary edema is noted as well.   Electronically Signed   By: Lupita Raider, M.D.   On: 09/24/2014 11:42   Dg Chest Port 1 View  09/25/2014   CLINICAL DATA:  Dyspnea  EXAM: PORTABLE CHEST - 1 VIEW  COMPARISON:  09/24/2014  FINDINGS: Cardiomediastinal silhouette is stable. Status post CABG. Central mild vascular congestion and mild perihilar interstitial prominence without convincing pulmonary edema. No segmental infiltrate.  IMPRESSION: Status post CABG. Central mild vascular congestion and mild perihilar interstitial prominence without convincing pulmonary edema. No segmental infiltrate.   Electronically Signed   By: Natasha Mead M.D.    On: 09/25/2014 08:34    ASSESSMENT AND PLAN  81 yo [place] female presented with SOB and cough.  1. Acute on chronic combined systolic and diastolic HF  - continue amlodipine, coreg, hydralazine, imdur  - not entirely sure if I/O is accurate, currently +500 ml since admission. Weight down from 109 to 105 which is likely her dry weight.  - physical exam shows no rale, no LE edema and no wheezing. She appears to be euvolemic. So far she only had  IV lasix 2 days ago and  IV lasix yesterday. It does not appear she is on home lasix dose. Given recurrent readmission for HF, not unreasonable for PRN  PO lasix at home as needed for SOB.  - on  coreg, hydralazine, imdur and amlodipine. Last echo 04/2014 EF 40-45%, moderate TR - unclear reason for worsening renal function, may need limited echo to assess heart function if significant decrease. Although loss of atrial kick during a-fib may also caused decreased blood perfusion.   - Interview conducted in [place] this morning with daughter, apparently although pt only speak [place], family is[place] and daughter is fluent in [place]. Daughter is interested in conducting a family meeting with IM team regarding goal of care  2. Chest pain, without high risk biochemical or ECG features suggest acute coronary syndrome  - no plan for cardiac cath at this point given high risk of contrast induced permanent renal failure  - Chest pain worse with cough, serial trop negative, given her stage V CKD, would not pursue further invasive workup  3. Acute on chronic stage V CKD  - Cr worsened from 3.2 on arrival to 3.69 this morning. Euvolemic, will hold off on further diuresis.  4. CAD s/p CABG  - Echo 04/30/2014 EF 40-45%, mild MR, moderate TR, PA peak pressure .   - myoview 05/01/2014 EF 74% (likely not accurate given Echo around same), moderate dized infarct in mid inferior wall, no ischemia  5. Chronic anemia likely related  to CKD  6. Positive c diff, possible infectious colitis, although pt denies any abdominal pain  - on flagyl  7. Newly diagnosed PAF: first noted wide complex tachycardia around 3:04pm 5/25, however felt not to be vtach, but a-fib with aberrancy  - strip reviewed by Dr. Royann Shivers. Only 20 beats.   - CHA2DS2-Vasc score 6 (female, age, HF, HTN, DM)    - not a very ideal patient for systemic anticoagulation, continue ASA for now.  - had recurrent a-fib overnight  Signed, Amedeo Plenty Pager: [phone]  Patient seen and examined  Agree with findings of H Meng  Known CAD, Hx of acute on chronic systolic CHF.    Limited in treatment options given renal dysfunction. Volume does not appear increased   WOuld get limited echo to reassess LVEF   I do not think she is a good candidate for anticoagulaion.  No new recommenations.    Dietrich Pates

## 2014-09-27 NOTE — Progress Notes (Signed)
Echocardiogram 2D Echocardiogram has been performed.  Dorothey BasemanReel, Konstantinos Cordoba M 09/27/2014, 2:34 PM

## 2014-09-27 NOTE — Progress Notes (Signed)
Night float addendum  We were called about and checked on Ms. Change during the night in regards to her change in rhythm to Afib which was sustained through the night and morning. She apparently had a brief episode the day before noted to be afib with aberrancy but no other note of Afib in history. Her HR was 80s-low 100s. We reviewed this with the patient and she was still complaining of cough, chest pain when coughing at times, and had some wheezing on exam. Her grandson was with her during the night who speaks both english and their native language. I reviewed the case in extensive detail with on call cardiology at night, with cards fellow Dr. Nori RiisAlavarez. We reviewed the notes and also calculated and reviewed her CHA2DS2-VASc Score of 6 with estimated adjusted stroke rate of 9.8%/year and estimated HAS-BLED score of 4 with bleeding rate of 8.7%/year. We discussed the reasonable options of either waiting to anticoagulate vs. starting heparin at night and reassing in AM given no signs of active bleed at that time and relatively stable hemoglobin. FOBT was negative this admission. Dr. Valentino Saxonothman also discussed with the daughter since [name] defers her decisions to her daughters who wished to proceed with anticoagulation at that time understanding possible risk of bleeding.  Repeat cbc was checked with Hb up to 10 and platelets up to 122. Heparin was then briefly started with help of pharmacy who adjusted dose to weight and plans to closely monitor. I encouraged the grandson to try to have the family here in the morning to meet with the day team and review further goals of care with the patient, family, and primary team.  Repeat Hb this morning is 10.4 with plt of 128. We reviewed the overnight events and the above discussion with the day team and Dr. Criselda PeachesMullen this morning who decided to hold anticoagulation for now pending further discussion and hopefully establish more clear goals of care with both the patient and  family this morning.

## 2014-09-27 NOTE — Progress Notes (Signed)
Per telemetry monitor, pt in Afib in 80s. Pt asymptomatic and resting comfortably in bed at this time. Dr. Valentino Saxonothman notified, new order for EKG. MD at bedside to see patient.

## 2014-09-28 DIAGNOSIS — I255 Ischemic cardiomyopathy: Secondary | ICD-10-CM

## 2014-09-28 LAB — GLUCOSE, CAPILLARY
GLUCOSE-CAPILLARY: 209 mg/dL — AB (ref 65–99)
Glucose-Capillary: 121 mg/dL — ABNORMAL HIGH (ref 65–99)

## 2014-09-28 LAB — CBC
HCT: 27.9 % — ABNORMAL LOW (ref 36.0–46.0)
Hemoglobin: 9.3 g/dL — ABNORMAL LOW (ref 12.0–15.0)
MCH: 29.8 pg (ref 26.0–34.0)
MCHC: 33.3 g/dL (ref 30.0–36.0)
MCV: 89.4 fL (ref 78.0–100.0)
Platelets: 110 K/uL — ABNORMAL LOW (ref 150–400)
RBC: 3.12 MIL/uL — ABNORMAL LOW (ref 3.87–5.11)
RDW: 14.2 % (ref 11.5–15.5)
WBC: 5.4 K/uL (ref 4.0–10.5)

## 2014-09-28 LAB — BASIC METABOLIC PANEL WITH GFR
Anion gap: 11 (ref 5–15)
BUN: 32 mg/dL — ABNORMAL HIGH (ref 6–20)
CO2: 18 mmol/L — ABNORMAL LOW (ref 22–32)
Calcium: 8.5 mg/dL — ABNORMAL LOW (ref 8.9–10.3)
Chloride: 109 mmol/L (ref 101–111)
Creatinine, Ser: 3.76 mg/dL — ABNORMAL HIGH (ref 0.44–1.00)
GFR calc Af Amer: 12 mL/min — ABNORMAL LOW
GFR calc non Af Amer: 10 mL/min — ABNORMAL LOW
Glucose, Bld: 158 mg/dL — ABNORMAL HIGH (ref 65–99)
Potassium: 3.1 mmol/L — ABNORMAL LOW (ref 3.5–5.1)
Sodium: 138 mmol/L (ref 135–145)

## 2014-09-28 LAB — VITAMIN D 1,25 DIHYDROXY
Vitamin D 1, 25 (OH)2 Total: 58 pg/mL
Vitamin D2 1, 25 (OH)2: <10 pg/mL
Vitamin D3 1, 25 (OH)2: 58 pg/mL

## 2014-09-28 MED ORDER — VITAMIN D3 25 MCG (1000 UNIT) PO TABS: 2000.0000 [IU] | ORAL_TABLET | Freq: Every day | ORAL | Status: DC

## 2014-09-28 MED ORDER — GUAIFENESIN-DM 100-10 MG/5ML PO SYRP: 5.0000 mL | ORAL_SOLUTION | ORAL | Status: DC | PRN

## 2014-09-28 MED ORDER — POTASSIUM CHLORIDE CRYS ER 20 MEQ PO TBCR
40.0000 meq | EXTENDED_RELEASE_TABLET | Freq: Once | ORAL | Status: AC
  Administered 2014-09-28: 40 meq via ORAL
  Filled 2014-09-28: qty 2

## 2014-09-28 MED ORDER — FUROSEMIDE 40 MG PO TABS: 40.0000 mg | ORAL_TABLET | ORAL | Status: DC | PRN

## 2014-09-28 MED ORDER — METRONIDAZOLE 500 MG PO TABS: 500.0000 mg | ORAL_TABLET | Freq: Three times a day (TID) | ORAL | Status: DC

## 2014-09-28 MED ORDER — SODIUM BICARBONATE 650 MG PO TABS: 650.0000 mg | ORAL_TABLET | Freq: Two times a day (BID) | ORAL | Status: AC

## 2014-09-28 NOTE — Discharge Instructions (Signed)
Thank you for trusting us with your medical care!  You were hospitalized for chest pain and C. difficile diarrhea and treated with blood transfusion and medications. Please take a look below for diet recommendations.  To make sure you are getting better, please make it to the follow-up appointments listed on the first page.  If you have any questions, please call [phone].    Food Basics for Chronic Kidney Disease When your kidneys are not working well, they cannot remove waste and excess substances from your blood as effectively as they did before. This can lead to a buildup and imbalance of these substances, which can affect how your body functions. This buildup can also make your kidneys work harder, causing even more damage. You may need to eat less of certain foods that can lead to the buildup of these substances in your body. By making the changes to your diet that are recommended by your dietitian or health care provider, you could possibly help prevent further kidney damage and delay or prevent the need for dialysis. The following information can help give you a basic understanding of these substances and how they affect your bodily functions. The information also gives examples of foods that contain the highest amounts of these substances.  WHAT DO I NEED TO KNOW ABOUT SUBSTANCES IN MY FOOD THAT I MAY NEED TO ADJUST? Food adjustments will be different for each person with chronic kidney disease. It is important that you see a dietitian who can help you determine the specific adjustments that you will need to make for each of the following substances:  Potassium Potassium affects how steadily your heart beats. If too much potassium builds up in your blood, it can cause an irregular heartbeat or even a heart attack. Examples of foods rich in potassium include:  Milk.  Fruits.  Vegetables.  Phosphorus Phosphorus is a mineral found in your bones. A balance between calcium and  phosphorous is needed to build and maintain healthy bones. Too much phosphorus pulls calcium from your bones. This can make your bones weak and more likely to break. Too much phosphorus can also make your skin itch. Examples of foods rich in phosphorus include:  Milk and cheese.  Dried beans.  Peas.  Colas.  Nuts and peanut butter.  Animal Protein Animal protein helps you make and keep muscle. It also helps in the repair of your body's cells and tissues. One of the natural breakdown products of protein is a waste product called urea. When your kidneys are not working properly, they cannot remove wastes such as urea like they did before you developed chronic kidney disease. You will likely need to limit the amount of protein you eat to help prevent a buildup of urea in your blood. Examples of animal protein include:  Meat (all types).  Fish and seafood.  Poultry.  Eggs.  Sodium Sodium, which is found in salt, helps maintain a healthy balance of fluids in your body. Too much sodium can increase your blood pressure level and have a negative affect on the function of your heart and lungs. Too much sodium also can cause your body to retain too much fluid, making your kidneys work harder. Examples of foods with high levels of sodium include:  Salt seasonings.  Soy sauce.  Cured and processed meats.  Salted crackers and snack foods.  Fast food.  Canned soups and most canned foods.  Glucose Glucose provides energy for your body. If you have diabetes mellitus that is  not properly controlled, you have too much glucose in your blood. Too much glucose in your blood can worsen the function of your kidneys by damaging small blood vessels. This prevents enough blood flow to your kidneys to give them what they need to work. If you have diabetes mellitus and chronic kidney disease, it is important to maintain your blood glucose at a level recommended by your health care provider.  SHOULD I  TAKE A VITAMIN AND MINERAL SUPPLEMENT? Because you may need to avoid eating certain foods, you may not get all of the vitamins and minerals that would normally come from those foods. Your health care provider or dietitian may recommend that you take a supplement to ensure that you get all of the vitamins and minerals that your body needs.  Document Released: 07/10/2002 Document Revised: 09/03/2013 Document Reviewed: 03/16/2013 [facility] Patient Information 2015 [place], [place]. This information is not intended to replace advice given to you by your health care provider. Make sure you discuss any questions you have with your health care provider.

## 2014-09-28 NOTE — Discharge Summary (Signed)
Name: Bianca [name] MRN: [id number] DOB: 06-Oct-1930 81 y.o. PCP: Bianca Hoff, MD  Date of Admission: 09/24/2014 11:11 AM Date of Discharge: 09/28/2014 Attending Physician: Bianca Catalina, MD  Discharge Diagnosis: Chest pain of multifactorial etiology CKD Stage V complicated by non-gap metabolic acidosis Metabolic bone disease Acute on chronic normocytic anemia GBS UTI with history of urinary retention C. difficile colitis New-onset paroxysmal atrial fibrillation CHF Type 2 diabetes Hypertension Hyperlipidemia Presumed chronic hepatitis B infection COPD Stage II GERD Constipation  Discharge Medications:   Medication List    STOP taking these medications        calcium citrate-vitamin D 315-200 MG-UNIT per tablet  Commonly known as:  CITRACAL+D     docusate sodium 100 MG capsule  Commonly known as:  COLACE     NYQUIL PO     polyethylene glycol packet  Commonly known as:  MIRALAX     sennosides-docusate sodium 8.6-50 MG tablet  Commonly known as:  SENOKOT-S      TAKE these medications        acetaminophen 500 MG tablet  Commonly known as:  TYLENOL  Take 2 tablets (1,000 mg total) by mouth every 8 (eight) hours as needed for mild pain.     amLODipine 10 MG tablet  Commonly known as:  NORVASC  Take 1 tablet (10 mg total) by mouth daily.     aspirin EC 81 MG tablet  TAKE 1 TABLET BY MOUTH EVERY DAY     B-D ULTRAFINE III SHORT PEN 31G X 8 MM Misc  Generic drug:  Insulin Pen Needle  USE AS DIRECTED TO INJECT INSULIN 3 TIMES A DAY     BAYER MICROLET LANCETS lancets  Use as instructed     carvedilol 25 MG tablet  Commonly known as:  COREG  TAKE 1 TABLET (25 MG TOTAL) BY MOUTH 2 (TWO) TIMES DAILY WITH A MEAL.     cholecalciferol 1000 UNITS tablet  Commonly known as:  VITAMIN D  Take 2 tablets (2,000 Units total) by mouth daily.     CRESTOR 20 MG tablet  Generic drug:  rosuvastatin  TAKE 1 TABLET (20 MG TOTAL) BY MOUTH DAILY.     diclofenac sodium  1 % Gel  Commonly known as:  VOLTAREN  Apply 2 g topically 4 (four) times daily.     fluticasone 44 MCG/ACT inhaler  Commonly known as:  FLOVENT HFA  Inhale 1 puff into the lungs 2 (two) times daily.     furosemide 40 MG tablet  Commonly known as:  LASIX  Take 1 tablet (40 mg total) by mouth as needed (leg swelling, shortness of breath).     glucose blood test strip  Commonly known as:  BAYER CONTOUR NEXT TEST  1 each by Other route as needed for other. Use 4 to 5 times daily to check blood sugar. diag code E11.9. Insulin dependent     guaiFENesin-dextromethorphan 100-10 MG/5ML syrup  Commonly known as:  ROBITUSSIN DM  Take 5 mLs by mouth every 4 (four) hours as needed for cough.     hydrALAZINE 25 MG tablet  Commonly known as:  APRESOLINE  Take 1 tablet (25 mg total) by mouth 3 (three) times daily.     HYDROcodone-acetaminophen 5-325 MG per tablet  Commonly known as:  NORCO/VICODIN  Take 2 tablets by mouth every 4 (four) hours as needed.     insulin aspart 100 UNIT/ML FlexPen  Commonly known as:  NOVOLOG FLEXPEN  Please  take 8 units with breakfast, 8 units with lunch, and 10 units with dinner/supper     insulin glargine 100 UNIT/ML injection  Commonly known as:  LANTUS  Inject 0.05 mLs (5 Units total) into the skin at bedtime.     isosorbide mononitrate 120 MG 24 hr tablet  Commonly known as:  IMDUR  Take 120 mg by mouth daily.     metroNIDAZOLE 500 MG tablet  Commonly known as:  FLAGYL  Take 1 tablet (500 mg total) by mouth every 8 (eight) hours.     nitroGLYCERIN 0.4 MG SL tablet  Commonly known as:  NITROSTAT  Place 0.4 mg under the tongue every 5 (five) minutes as needed. For chest pain. Maximum of 3 doses     omeprazole 20 MG capsule  Commonly known as:  PRILOSEC  TAKE 1 CAPSULE (20 MG TOTAL) BY MOUTH DAILY.     PROAIR HFA 108 (90 BASE) MCG/ACT inhaler  Generic drug:  albuterol  INHALE 1 PUFF EVERY 6 HOURS AS NEEDED FOR WHEEZE     sodium bicarbonate 650  MG tablet  Take 1 tablet (650 mg total) by mouth 2 (two) times daily.        Disposition and follow-up:   Bianca [name] was discharged from [facility] in Stable condition.  At the hospital follow up visit please address:  Goals of care  Chronic acidosis: recheck BMET to assess response to oral bicarb  Anemia: recheck CBC  UTI: resolution of dysuria  C. difficile colitis: completion of treatment, resolution of diarrhea  Metabolic bone disease: recheck PTH, Vit D, Ca, P in 4-8 weeks, adherence to cholecalciferol  Chronic HBV: consider rechecking LFTs  CHF: check weights  Follow-up Appointments:   Discharge Instructions: Discharge Instructions    Discharge instructions    Complete by:  As directed   For caregivers working with him, please advise them to wash their hands while she is recovering from this illness.  Please also weigh her daily and keep track of them for her next clinic visit.     Increase activity slowly    Complete by:  As directed            Consultations: Treatment Team:  Bianca Coil, MD [facility], MD  Procedures Performed:  Dg Chest 2 View  09/24/2014   CLINICAL DATA:  Chest pain.  EXAM: CHEST  2 VIEW  COMPARISON:  August 23, 2014.  FINDINGS: Stable cardiomegaly. Status post coronary artery bypass graft. No pneumothorax or significant pleural effusion is noted. Mild central pulmonary vascular congestion is noted which is increased compared to prior exam. Probable minimal bilateral pulmonary edema is noted.  IMPRESSION: Mild central pulmonary vascular congestion is noted which is increased compared to prior exam. Minimal to mild bilateral pulmonary edema is noted as well.   Electronically Signed   By: Bianca [name], M.D.   On: 09/24/2014 11:42   Dg Chest Port 1 View  09/25/2014   CLINICAL DATA:  Dyspnea  EXAM: PORTABLE CHEST - 1 VIEW  COMPARISON:  09/24/2014  FINDINGS: Cardiomediastinal silhouette is stable. Status post  CABG. Central mild vascular congestion and mild perihilar interstitial prominence without convincing pulmonary edema. No segmental infiltrate.  IMPRESSION: Status post CABG. Central mild vascular congestion and mild perihilar interstitial prominence without convincing pulmonary edema. No segmental infiltrate.   Electronically Signed   By: Natasha Mead M.D.   On: 09/25/2014 08:34    2D Limited Echo:   Study Conclusions  -  Left ventricle: Severe hypokinesis of mid/apical inferior segments. All other segments move well. The cavity size was normal. Wall thickness was increased in a pattern of mild LVH. The estimated ejection fraction was 55%. - Aortic valve: Sclerosis without stenosis. There was no significant regurgitation. - Left atrium: The atrium was mildly dilated. - Right ventricle: The cavity size was normal. Systolic function was normal. - Pulmonary arteries: PA peak pressure: 42 mm Hg (S).  Admission HPI:   [name] is an 81 year old female with CKD Stage 5, CAD status post CABG, CHF, type 2 diabetes, hyperlipidemia, chronic normocytic anemia, asthma, GERD, constipation who presented with chest pain, abdominal tightness, leg swelling. Her daughter-in-law was present at bedside and contributed to the interview using the translator phone.  Around 1 AM this morning, the patient felt sudden onset left-sided chest pain as well as abdominal tightness. The patient reports the pain is not radiating and is worse with palpation as well as inspiration. Preceding the onset of chest pain, daughter-in-law reports that the patient has been developing progressive bilateral leg swelling for the last 2-3 days. Associated with this chest pain is abdominal tightness and 2 episodes of nonbloody emesis since this morning.   She was hospitalized for similar complaints back in February and most recently in April of this year at which time cardiac workup was reassuring. She was last seen by cardiology  on 07/03/14 and was advised to continue on salt and fluid restriction given her euvolemic status. At her most recent hospitalization, she was found to have ESBL UTI complicated by urinary retention. Of note, she also reports difficulty urinating and dysuria for the last several days along with subjective fevers though does not know if her weight has changed as she does not weigh herself daily. She also reports baseline back pain and some shortness of breath for which she uses her inhalers 1-2 times daily. Otherwise, she denies dizziness or recent falls. She lives at home with a lot of family members, and one of her grandkids was recently ill with a common cold. Otherwise, she denies any prior history of tobacco or alcohol use.  In the emergency department, she was given aspirin 325 mg and Lasix 20 mg IV. Initial troponin was found to be negative.   Hospital Course by problem list:  CKD Stage V complicated by non-gap metabolic acidosis: Creatinine 3.7 this morning, improved from 4.6 yesterday given baseline mostly 3-3.5 over this past year though has been steadily uptrending. Increasing creatinine yesterday likely in the setting of her acute diarrheal illness. Bicarbonate 18 this morning, which appears to be at her baseline since December 2015. She was last seen by her nephrologist, Dr. Lowell GuitarPowell, on 07/31/14 with follow-up in 4 months. Renal ultrasound last month was otherwise reassuring notable only for bilateral renal cysts. Given her chronic acidosis, she was discharged on sodium bicarbonate 650 mg twice daily and should have BMET rechecked at the time of follow-up.  Metabolic bone disease: PTH elevated at 100, increased from 56 back in March when she last presented to her nephrologist office. Vitamin D low at 16.5 and would require roughly 2000 units daily to correct her vitamin D. Corrected calcium 9.4 on admission, phosphorus 4.5 in April. She was started on cholecalciferol 2000 units daily and should be  reassessed at the time of follow-up in 4-8 weeks.  Acute on chronic normocytic anemia: Anemia panel 08/24/14 notable for low normal iron 45, normal ferritin 158, low TIBC 200 suggestive of anemia of chronic disease.She received  pRBC x 1 for Hb 6.9 which improved her chest pain dyspnea at rest. Hemoglobin trended at her baseline 8-10 since December 2015 thereafter, and she received Aranesp. She remained without signs of active bleeding.   GBS UTI with history of urinary retention: As noted on urine culture. She was initially treated with Zosyn given sensitivities but was then transitioned to fosfomycin once she was diagnosed with C. Dif colitis to complete a total 3-day course of treatment. She remained without dysuria at the time of discharge.  C. difficile colitis: C. difficile PCR was positive on 5/25. Risk factors include multiple healthcare exposure, recent antibiotic use. No fever or leukocytosis was reassuring and she was started on Flagyl 500 mg 3 times daily to complete a 10-day course of treatment [10/05/14]. Her family was given clear hygiene instructions to avoid spreading the infection.  New-onset paroxysmal atrial fibrillation: Presumed to be in the setting of acute diarrheal illness superimposed on her other comorbidities. CHADSVASC 7 given her other comorbidities and age though HAS-BLED score 5. Given the risks of CVA versus bleeding, anticoagulation was deferred. Cardiology is in agreement with continuing her aspirin.  CHF: Repeat echo yesterday with EF 55%, elevated pulmonary arterial pressure, aortic sclerosis. She also has a history of CABG. Her medications include Imdur 120 mg daily, Coreg 25 mg twice daily, aspirin 81 mg, nitroglycerin 0.4 mg every 5 minutes as needed for chest pain. She was also started on Lasix 40 mg as needed for lower extremity edema and advised patient's family to weigh her daily to also help determine when she needs this medication. Her discharge weight was 103 lbs,  6 lbs down from admission, with Lasix IV.   Type 2 diabetes: Glucose trended mostly low to mid 100s on home Lantus and sensitive SSI. A1c 6.9 when checked earlier this month though has been as high as 9.3 in February 2015. Her medications include Lantus 5 units at bedtime, NovoLog 8 units with breakfast, 8 units with lunch, 10 units with dinner.  Chest pain: Likely multifactorial given her severe cardiac and renal disease along with respiratory disease though now only reports post-tussive chest pain. Resolved at the time of discharge with management as noted above for all of her co-morbidities.  Hypertension: Stable on home medications.  Hyperlipidemia: Most recent lipid panel in February 2015 notable for total cholesterol 218, HDL 65, LDL 106, triglycerides 235. Stable on home medications.  Presumed chronic hepatitis B infection: Per chart review, appears that she had a healthcare exposure to HBV after hospitalization in April. Labs checked at office visit 5/10 notable for reactive HBV core antibody, elevated HBV DNA, positive HBVe antibody, unreactive HBV surface antibody, reactive HBV surface antigen which is suggestive of chronic  infection though unclear if surface antibody is IgM or IgG. LFTs reassuring on admission.  COPD Stage II: PFTs 07/09/14 with FEV1/FVC 84% and post bronchodilator FEV1 66%. Her medications include albuterol 1 puff every 6 hours as needed for wheezing, Flovent 1 puff twice daily. Stable on home medications.  GERD: Home medications include Prilosec 20 mg. Stable on home medications.  Constipation: Her medications were initially started then held in the setting of diarrhea.   Discharge Vitals:   BP 135/65 mmHg  Pulse 83  Temp(Src) 98.4 F (36.9 C) (Oral)  Resp 16  Ht  (1.549 m)  Wt 103 lb 4.8 oz (46.857 kg)  BMI 19.53 kg/m2  SpO2 97%  Discharge Labs:  No results found for this or any previous visit (from  the past 24 hour(s)).  Signed: [name],  MD 10/01/2014, 8:50 PM    Services Ordered on Discharge: None Equipment Ordered on Discharge: None

## 2014-09-28 NOTE — Progress Notes (Signed)
Subjective: This morning, her granddaughter Bianca [name] was present at bedside. We updated her on the condition of her grandmother and explained to her the medications that she'll need to go home on. We also explained the discussion we had yesterday with her grandmother and acknowledged agreement though would like to speak with the rest of her family regarding her grandmother's code status.  Objective: Vital signs in last 24 hours: Filed Vitals:   09/27/14 2238 09/28/14 0146 09/28/14 0435 09/28/14 0838  BP: 133/55  135/65   Pulse:  80 83   Temp:   98.4 F (36.9 C)   TempSrc:   Oral   Resp:  16 16   Height:      Weight:   103 lb 4.8 oz (46.857 kg)   SpO2:  96% 97% 97%   Weight change: -8 oz (-0.227 kg)  Intake/Output Summary (Last 24 hours) at 09/28/14 1045 Last data filed at 09/28/14 0919  Gross per 24 hour  Intake    960 ml  Output    925 ml  Net     35 ml   General: Elderly [place] female, sitting up in bed, no acute distress HEENT: PERRL, EOMI, no scleral icterus, no JVD Cardiac: RRR, no rubs, murmurs or gallops Pulm: Clear to auscultation bilaterally without wheezing but no crackles Abd: soft, nontender, nondistended, BS present Ext: warm and well perfused, no pitting edema noted on bilateral lower extremities  Lab Results: Basic Metabolic Panel:  Recent Labs Lab 09/25/14 0144  09/27/14 0329 09/28/14 0321  NA 138  < > 138 138  K 4.4  < > 3.8 3.1*  CL 108  < > 107 109  CO2 18*  < > 17* 18*  GLUCOSE 153*  < > 181* 158*  BUN 41*  < > 40* 32*  CREATININE 3.47*  < > 4.55* 3.76*  CALCIUM 8.6*  8.4*  < > 8.9 8.5*  MG 2.1  --  2.0  --   PHOS  --   --  4.0  --   < > = values in this interval not displayed.  Liver Function Tests:  Recent Labs Lab 09/24/14 1101 09/27/14 0329  AST 27  --   ALT 33  --   ALKPHOS 71  --   BILITOT 0.6  --   PROT 6.5  --   ALBUMIN 3.4* 3.3*   CBC:  Recent Labs Lab 09/27/14 0836 09/28/14 0321  WBC 7.8 5.4  HGB 10.4* 9.3*    HCT 31.0* 27.9*  MCV 89.1 89.4  PLT 128* 110*   Cardiac Enzymes:  Recent Labs Lab 09/27/14 0320 09/27/14 0836 09/27/14 1436  TROPONINI 0.03 <0.03 0.03   CBG:  Recent Labs Lab 09/26/14 2116 09/27/14 0508 09/27/14 1134 09/27/14 1646 09/27/14 2220 09/28/14 0549  GLUCAP 169* 166* 224* 100* 191* 121*   Studies/Results: No results found. Medications: I have reviewed the patient's current medications. Scheduled Meds: . sodium chloride   Intravenous Once  . amLODipine  10 mg Oral Daily  . aspirin EC  81 mg Oral Daily  . budesonide  2 mL Inhalation BID  . carvedilol  25 mg Oral BID WC  . cholecalciferol  2,000 Units Oral Daily  . docusate sodium  100 mg Oral BID  . hydrALAZINE  25 mg Oral TID  . insulin aspart  0-15 Units Subcutaneous TID WC  . insulin glargine  5 Units Subcutaneous QHS  . isosorbide mononitrate  120 mg Oral Daily  . metroNIDAZOLE  500 mg Oral 3 times per day  . pantoprazole  40 mg Oral Daily  . rosuvastatin  20 mg Oral q1800  . sodium bicarbonate  650 mg Oral BID  . sodium chloride  3 mL Intravenous Q12H   Continuous Infusions:  PRN Meds:.acetaminophen, albuterol, guaiFENesin-dextromethorphan, nitroGLYCERIN, ondansetron (ZOFRAN) IV Assessment/Plan:  [name] is an 81 year old female with CKD Stage 5, CAD status post CABG, CHF, type 2 diabetes, hyperlipidemia, chronic normocytic anemia, asthma, GERD, constipation who presented with chest pain found to have paroxysmal atrial fibrillation and C. difficile colitis now with improving creatinine.  New-onset paroxysmal atrial fibrillation: Presumed to be in the setting of acute diarrheal illness superimposed on her other comorbidities. CHADSVASC 7 given her other comorbidities and age though HAS-BLED score 5. Given the risks of CVA versus bleeding, anticoagulation was deferred. Cardiology is in agreement with continuing her aspirin. -Discharged on aspirin 81 mg  CKD Stage V complicated by non-gap metabolic  acidosis: Creatinine 3.7 this morning, improved from 4.6 yesterday given baseline mostly 3-3.5 over this past year though has been steadily uptrending. Increasing creatinine yesterday likely in the setting of her acute diarrheal illness. Bicarbonate 18 this morning, which appears to be at her baseline since December 2015. She was last seen by her nephrologist, Dr. Lowell GuitarPowell, on 07/31/14 with follow-up in 4 months. Renal ultrasound last month was otherwise reassuring notable only for bilateral renal cysts. -Discharged on sodium bicarbonate 650 mg twice daily  C. difficile colitis: C. difficile PCR was positive on 5/25. Risk factors include multiple healthcare exposure, recent antibiotic use. No fever or leukocytosis is reassuring. -Discharged on Flagyl 500 mg 3 times daily [day 3/10]  Acute on chronic normocytic anemia: Hemoglobin 9.3, baseline 8-10 since December 2015, and she received Aranesp yesterday. No signs of active bleeding. Anemia panel 08/24/14 notable for low normal iron 45, normal ferritin 158, low TIBC 200 suggestive of anemia of chronic disease. -Recheck CBC at discharge  Chest pain: Likely multifactorial given her severe cardiac and renal disease along with respiratory disease though now only reports post-tussive chest pain. -Discharged on Robitussin DM for cough -Continue nitroglycerin sublingual prn for chest pain  GBS UTI with history of urinary retention: As noted on urine culture. She received adequate treatment with fosfomycin.  Metabolic bone disease: PTH elevated at 100, increased from 56 back in March when she last presented to her nephrologist office. Vitamin D low at 16.5 and would require roughly 2000 units daily to correct her vitamin D. Corrected calcium 9.4 on admission, phosphorus 4.5 in April. -Continue cholecalciferol 2000 units daily -Provided family with diet recommendations  CHF: Repeat echo yesterday with EF 55%, elevated pulmonary arterial pressure, aortic  sclerosis. She also has a history of CABG. Her medications include Imdur 120 mg daily, Coreg 25 mg twice daily, aspirin 81 mg, nitroglycerin 0.4 mg every 5 minutes as needed for chest pain. -Discharged on Lasix 40 mg as needed for lower extremity edema, shortness of breath along with her other home medications -Advised patient's family to weigh her daily to also help determine when she needs this medication  Type 2 diabetes: Glucose trending mostly low to mid 100s improved from yesterday. A1c 6.9 when checked earlier this month though has been as high as 9.3 in February 2015. Her medications include Lantus 5 units at bedtime, NovoLog 8 units with breakfast, 8 units with lunch, 10 units with dinner. -Resume home regimen  Hypertension: Currently normotensive. Home medications include Coreg 25 mg, hydralazine 25 mg 3 times  daily, amlodipine 10 mg. -Discharged on home meds  Hyperlipidemia: Most recent lipid panel in February 2015 notable for total cholesterol 218, HDL 65, LDL 106, triglycerides 235. Her medications include Crestor 20 mg. -Discharged on home meds  Presumed chronic hepatitis B infection: Per chart review, appears that she had a healthcare exposure to HBV after hospitalization in April. Labs checked at office visit 5/10 notable for reactive HBV core antibody, elevated HBV DNA, positive HBVe antibody, unreactive HBV surface antibody, reactive HBV surface antigen which is suggestive of acute infection though unclear if surface antibody is IgM or IgG. LFTs reassuring on admission. -Reassess at follow-up  COPD Stage II: PFTs 07/09/14 with FEV1/FVC 84% and post bronchodilator FEV1 66%. Her medications include albuterol 1 puff every 6 hours as needed for wheezing, Flovent 1 puff twice daily. -Discharged on home meds  GERD: Home medications include Prilosec 20 mg. -Resume home meds  Constipation: Holding her medications in the setting of diarrhea.  #FEN:  -Diet: Renal with fluid  restriction -Give Kdur for K 3.1  #DVT prophylaxis: Heparin   #CODE STATUS: DNR/DNI -Defer to granddaughter Bianca [name] if patients lacks decision-making capacity -Confirmed with patient and family yesterday and today  Dispo: Discharge today.  The patient does have a current PCP (Bianca Arlyce Harman, MD) and does need an [facility] hospital follow-up appointment after discharge.  The patient does have transportation limitations that hinder transportation to clinic appointments.  .Services Needed at time of discharge: Y = Yes, Blank = No PT:   OT:   RN:   Equipment:   Other:     LOS: 4 days   [name], MD 09/28/2014, 10:45 AM

## 2014-09-29 ENCOUNTER — Telehealth: Payer: Self-pay | Admitting: Internal Medicine

## 2014-09-29 NOTE — Telephone Encounter (Signed)
  Reason for call:   I placed an outgoing call to Bianca [name], granddaughter of Bianca [name] at 4:15 PM regarding her request for an update of her grandmother's recent hospitalization.  She had called yesterday to the nurse taking care of her grandmother, but I was unable to call her at that time. Her family, including her grandmother, have given us permission to speak with her.    Assessment/ Plan:   We discussed how she developed C. difficile colitis and paroxysmal atrial fibrillation, likely in the setting of physiologic stress, and how she is currently on maximum medical therapy.  I also discussed with her my conversations with her on [place] along with her grandmother in the presence of a translator and how we came to decide her code status as Bianca [name] was confused why her grandmother desired DNR/DNI. Bianca [name] felt that her grandmother was a very Occupational hygienistfeisty and vibrant woman and would want "everything done to preserve her life."  We discussed that given her some grandmother's severe cardiorenal disease, CPR and intubation would likely not preserve her goals of care for comfort and maximum independence at home. I also explained that the cardiologists acknowledge she would be a poor candidate for interventions. However, we did acknowledge that situations change as do priorities in that her code status certainly didn't cover other interventions, like IV fluids and antibiotics. Bianca [name] just wanted to verify that her grandmother understood the intent of my discussion and what this would mean for her priorities. She reported to me that her grandmother often wavers in what she wants, like wanting to live as long as she can while also preserving comfort.   I discussed with Bianca [name] that she should discuss with her family and grandmother regarding her goals of care to better elicit which interventions would match them. I also expressed to her that her grandmother's regular doctor, Dr. Beckie Saltsivet, would be in clinic next month should a  family meeting be conducted.  She thanked me for my time and appreciated our care for her grandmother.    Bianca [name] V, MD   09/29/2014, 4:57 PM

## 2014-10-01 ENCOUNTER — Encounter: Payer: Medicare Other | Admitting: Internal Medicine

## 2014-10-04 ENCOUNTER — Encounter: Payer: Self-pay | Admitting: Pulmonary Disease

## 2014-10-04 ENCOUNTER — Ambulatory Visit (INDEPENDENT_AMBULATORY_CARE_PROVIDER_SITE_OTHER): Payer: Medicare Other | Admitting: Pulmonary Disease

## 2014-10-04 VITALS — BP 135/68 | HR 82 | Temp 99.0°F | Ht 59.0 in | Wt 103.4 lb

## 2014-10-04 DIAGNOSIS — N390 Urinary tract infection, site not specified: Secondary | ICD-10-CM

## 2014-10-04 DIAGNOSIS — E872 Acidosis: Secondary | ICD-10-CM

## 2014-10-04 DIAGNOSIS — J209 Acute bronchitis, unspecified: Secondary | ICD-10-CM | POA: Diagnosis not present

## 2014-10-04 DIAGNOSIS — D649 Anemia, unspecified: Secondary | ICD-10-CM | POA: Diagnosis not present

## 2014-10-04 DIAGNOSIS — Z794 Long term (current) use of insulin: Secondary | ICD-10-CM

## 2014-10-04 DIAGNOSIS — Z7982 Long term (current) use of aspirin: Secondary | ICD-10-CM

## 2014-10-04 DIAGNOSIS — I132 Hypertensive heart and chronic kidney disease with heart failure and with stage 5 chronic kidney disease, or end stage renal disease: Secondary | ICD-10-CM | POA: Diagnosis not present

## 2014-10-04 DIAGNOSIS — I5042 Chronic combined systolic (congestive) and diastolic (congestive) heart failure: Secondary | ICD-10-CM | POA: Diagnosis not present

## 2014-10-04 DIAGNOSIS — A0472 Enterocolitis due to Clostridium difficile, not specified as recurrent: Secondary | ICD-10-CM

## 2014-10-04 DIAGNOSIS — A047 Enterocolitis due to Clostridium difficile: Secondary | ICD-10-CM | POA: Diagnosis not present

## 2014-10-04 DIAGNOSIS — E1122 Type 2 diabetes mellitus with diabetic chronic kidney disease: Secondary | ICD-10-CM | POA: Diagnosis not present

## 2014-10-04 DIAGNOSIS — N185 Chronic kidney disease, stage 5: Secondary | ICD-10-CM | POA: Diagnosis not present

## 2014-10-04 DIAGNOSIS — I10 Essential (primary) hypertension: Secondary | ICD-10-CM

## 2014-10-04 LAB — CBC WITH DIFFERENTIAL/PLATELET
Basophils Absolute: 0 10*3/uL (ref 0.0–0.1)
Basophils Relative: 0 % (ref 0–1)
EOS ABS: 0.1 10*3/uL (ref 0.0–0.7)
Eosinophils Relative: 1 % (ref 0–5)
HEMATOCRIT: 32.4 % — AB (ref 36.0–46.0)
HEMOGLOBIN: 10.9 g/dL — AB (ref 12.0–15.0)
Lymphocytes Relative: 19 % (ref 12–46)
Lymphs Abs: 1.5 10*3/uL (ref 0.7–4.0)
MCH: 30.3 pg (ref 26.0–34.0)
MCHC: 33.6 g/dL (ref 30.0–36.0)
MCV: 90 fL (ref 78.0–100.0)
MONOS PCT: 8 % (ref 3–12)
MPV: 9.6 fL (ref 8.6–12.4)
Monocytes Absolute: 0.6 10*3/uL (ref 0.1–1.0)
Neutro Abs: 5.7 10*3/uL (ref 1.7–7.7)
Neutrophils Relative %: 72 % (ref 43–77)
PLATELETS: 122 10*3/uL — AB (ref 150–400)
RBC: 3.6 MIL/uL — AB (ref 3.87–5.11)
RDW: 15.5 % (ref 11.5–15.5)
WBC: 7.9 10*3/uL (ref 4.0–10.5)

## 2014-10-04 MED ORDER — BENZONATATE 100 MG PO CAPS: 100.0000 mg | ORAL_CAPSULE | Freq: Three times a day (TID) | ORAL | Status: DC | PRN

## 2014-10-04 NOTE — Assessment & Plan Note (Signed)
Had symptomatic acute on chronic anemia during her hospitalization 09/20/2014-09/28/2014. Was transfused with 1u pRBC.  Plan: -Recheck CBC today

## 2014-10-04 NOTE — Progress Notes (Signed)
Subjective:   Patient ID: Bianca [name], female    DOB: [birth year], 81 y.o.   MRN: [id number]  HPI [name] is an 81 year old woman with history of CKD Stage 5, recurrent UTI, pAF, CAD status post CABG, CHF, type 2 diabetes, HTN, hyperlipidemia, chronic normocytic anemia, chronic Hep B, COPD, GERD presenting for follow-up. She is accompanied by an interpreter.  She was recently hospitalized from 09/20/2014-09/28/2014.  CKD Stage V complicated by non-gap metabolic acidosis: Baseline Cr around 3.5. Discharged on sodium bicarbonate 650 mg twice daily. Followed by Dr. Lowell GuitarPowell.  Symptomatic acute on chronic anemia: Denies SOB or CP  Recurrent UTI: Denies dysuria  C. difficile colitis: Flagyl 500 mg 3 times daily to complete a 10-day course of treatment on 10/05/2014. Diarrhea resolved.  CHF: Her discharge weight was 103 lbs. Denies LE edema.  Review of Systems Constitutional: no fevers/chills Eyes: no vision changes Ears, nose, mouth, throat, and face: + productive cough Respiratory: no shortness of breath Cardiovascular: no chest pain Gastrointestinal: no nausea/vomiting, no abdominal pain, no constipation, no diarrhea Genitourinary: no dysuria, no hematuria Integument: no rash Hematologic/lymphatic: no bleeding/bruising, no edema Musculoskeletal: no arthralgias, no myalgias Neurological: no paresthesias, no weakness  Past Medical History  Diagnosis Date  . GERD (gastroesophageal reflux disease)   . Hepatitis B   . Hyperlipemia   . Hypertension   . Cervical dysplasia   . Atrophic vaginitis   . Spinal stenosis     s/p decompression  . CAD (coronary artery disease) April 2008    s/p CABG;    cath 8/12:  LM patent, pLAD occluded, OM1 30-40%, pOM2 80% and 60% after the anastomosis, pRCA occluded, S-DX occluded (old), S-OM 2 occluded (new), L-LAD patent, dLAD provided collats to the PDA; Med Rx  rec.; consider PCI to OM2 if fails med Rx  . Hx of hysterectomy     daughter, [name],  denies this hx on 08/23/2014  . UTI (lower urinary tract infection)   . Diverticulosis of intestine 04/29/2014    Of ascending colon noted on CT abd/ pelvis 04/20/14  . Diabetes mellitus, type 2     insulin dependent  . COPD (chronic obstructive pulmonary disease)     Hattie Perch/notes 08/23/2014  . Chronic kidney disease (CKD), stage IV (severe)     Hattie Perch/notes 08/23/2014  . CHF (congestive heart failure)     Hattie Perch/notes 08/23/2014  . Chronic lower back pain   . Shortness of breath dyspnea   . UTI (urinary tract infection) 09/2014    Current Outpatient Prescriptions on File Prior to Visit  Medication Sig Dispense Refill  . acetaminophen (TYLENOL) 500 MG tablet Take 2 tablets (1,000 mg total) by mouth every 8 (eight) hours as needed for mild pain. 100 tablet 2  . amLODipine (NORVASC) 10 MG tablet Take 1 tablet (10 mg total) by mouth daily. 90 tablet 1  . aspirin EC 81 MG tablet TAKE 1 TABLET BY MOUTH EVERY DAY 150 tablet 1  . B-D ULTRAFINE III SHORT PEN 31G X 8 MM MISC USE AS DIRECTED TO INJECT INSULIN 3 TIMES A DAY 100 each 6  . BAYER MICROLET LANCETS lancets Use as instructed 200 each 5  . carvedilol (COREG) 25 MG tablet TAKE 1 TABLET (25 MG TOTAL) BY MOUTH 2 (TWO) TIMES DAILY WITH A MEAL. 120 tablet 3  . cholecalciferol (VITAMIN D) 1000 UNITS tablet Take 2 tablets (2,000 Units total) by mouth daily. 30 tablet 0  . CRESTOR 20 MG tablet TAKE  1 TABLET (20 MG TOTAL) BY MOUTH DAILY. 90 tablet 3  . diclofenac sodium (VOLTAREN) 1 % GEL Apply 2 g topically 4 (four) times daily. (Patient not taking: Reported on 08/23/2014) 100 g 0  . fluticasone (FLOVENT HFA) 44 MCG/ACT inhaler Inhale 1 puff into the lungs 2 (two) times daily. 1 Inhaler 12  . furosemide (LASIX) 40 MG tablet Take 1 tablet (40 mg total) by mouth as needed (leg swelling, shortness of breath). 30 tablet 0  . glucose blood (BAYER CONTOUR NEXT TEST) test strip 1 each by Other route as needed for other. Use 4 to 5 times daily to check blood sugar. diag code  E11.9. Insulin dependent 200 each 5  . guaiFENesin-dextromethorphan (ROBITUSSIN DM) 100-10 MG/5ML syrup Take 5 mLs by mouth every 4 (four) hours as needed for cough. 118 mL 0  . hydrALAZINE (APRESOLINE) 25 MG tablet Take 1 tablet (25 mg total) by mouth 3 (three) times daily. 270 tablet 5  . HYDROcodone-acetaminophen (NORCO/VICODIN) 5-325 MG per tablet Take 2 tablets by mouth every 4 (four) hours as needed. 12 tablet 0  . insulin aspart (NOVOLOG FLEXPEN) 100 UNIT/ML FlexPen Please take 8 units with breakfast, 8 units with lunch, and 10 units with dinner/supper 100 mL 9  . insulin glargine (LANTUS) 100 UNIT/ML injection Inject 0.05 mLs (5 Units total) into the skin at bedtime. 10 mL 5  . isosorbide mononitrate (IMDUR) 120 MG 24 hr tablet Take 120 mg by mouth daily.  1  . metroNIDAZOLE (FLAGYL) 500 MG tablet Take 1 tablet (500 mg total) by mouth every 8 (eight) hours. 15 tablet 0  . nitroGLYCERIN (NITROSTAT) 0.4 MG SL tablet Place 0.4 mg under the tongue every 5 (five) minutes as needed. For chest pain. Maximum of 3 doses    . omeprazole (PRILOSEC) 20 MG capsule TAKE 1 CAPSULE (20 MG TOTAL) BY MOUTH DAILY. 30 capsule 0  . PROAIR HFA 108 (90 BASE) MCG/ACT inhaler INHALE 1 PUFF EVERY 6 HOURS AS NEEDED FOR WHEEZE 8.5 each 2  . sodium bicarbonate 650 MG tablet Take 1 tablet (650 mg total) by mouth 2 (two) times daily. 30 tablet 0   No current facility-administered medications on file prior to visit.    Today's Vitals   10/04/14 1534  BP: 135/68  Pulse: 82  Temp: 99 F (37.2 C)  TempSrc: Oral  Height:  (1.499 m)  Weight: 103 lb 6.4 oz (46.902 kg)  SpO2: 100%  PainSc: 0-No pain    Objective:  Physical Exam  Constitutional: She is oriented to person, place, and time. She appears well-developed and well-nourished.  HENT:  Head: Normocephalic and atraumatic.  Eyes: Conjunctivae are normal.  Neck: Neck supple.  Cardiovascular: Normal rate and regular rhythm.   Pulmonary/Chest: Breath  sounds normal. She has no wheezes.  Abdominal: Soft.  Musculoskeletal: Normal range of motion. She exhibits no edema.  Neurological: She is alert and oriented to person, place, and time.  Skin: Skin is warm and dry.  Psychiatric: She has a normal mood and affect.    Assessment & Plan:  Please refer to problem based charting.

## 2014-10-04 NOTE — Assessment & Plan Note (Deleted)
Complicated by non-gap metabolic acidosis. Baseline Cr around 3.5. Discharged on sodium bicarbonate 650 mg twice daily. Compliant on medication.  Plan:  -Recheck BMP today 

## 2014-10-04 NOTE — Assessment & Plan Note (Signed)
Complicated by non-gap metabolic acidosis. Baseline Cr around 3.5. Discharged on sodium bicarbonate 650 mg twice daily. Compliant on medication.  Plan:  -Recheck BMP today

## 2014-10-04 NOTE — Progress Notes (Signed)
Internal Medicine Clinic Attending  Case discussed with Dr. Krall at the time of the visit.  We reviewed the resident's history and exam and pertinent patient test results.  I agree with the assessment, diagnosis, and plan of care documented in the resident's note.  

## 2014-10-04 NOTE — Assessment & Plan Note (Addendum)
Cough productive of white sputum for 2-3 weeks. No sick contacts. No abnormal vital signs (fever, tachypnea, or tachycardia) or signs of consolidation or rales on exam. Denies postnasal drip or rhinorrhea. Patient does not like Robitussin DM. Codeine containing cough syrups should be avoided in CKD stage 5. Promethazine containing cough syrups should be avoided in geriatric patients.  Plan: -Symptomatic treatment with Tessalon Perles TID prn

## 2014-10-04 NOTE — Assessment & Plan Note (Signed)
Denies dysuria. Resolved.

## 2014-10-04 NOTE — Assessment & Plan Note (Signed)
BP Readings from Last 3 Encounters:  10/04/14 135/68  09/28/14 135/65  09/10/14 134/69    Lab Results  Component Value Date   NA 138 09/28/2014   K 3.1* 09/28/2014   CREATININE 3.76* 09/28/2014    Assessment: Blood pressure control: controlled Progress toward BP goal:  at goal  Plan: Medications:  Continue current medications including amlodipine 10 mg daily, carvedilol 25 mg twice a day, Lasix 40 mg as needed, hydralazine 25 mg 3 times a day

## 2014-10-04 NOTE — Patient Instructions (Signed)
General Instructions:   Thank you for bringing your medicines today. This helps us keep you safe from mistakes.   Progress Toward Treatment Goals:  Treatment Goal 10/04/2014  Hemoglobin A1C at goal  Blood pressure at goal  Prevent falls -    Self Care Goals & Plans:  Self Care Goal 10/04/2014  Manage my medications take my medicines as prescribed; refill my medications on time; bring my medications to every visit  Monitor my health keep track of my blood glucose; bring my glucose meter and log to each visit; check my feet daily  Eat healthy foods drink diet soda or water instead of juice or soda; eat more vegetables; eat foods that are low in salt; eat baked foods instead of fried foods  Be physically active find an activity I enjoy  Meeting treatment goals -    Home Blood Glucose Monitoring 08/14/2014  Check my blood sugar 3 times a day  When to check my blood sugar -     Care Management & Community Referrals:  Referral 08/13/2013  Referrals made for care management support none needed  Referrals made to community resources none

## 2014-10-04 NOTE — Assessment & Plan Note (Signed)
Meter download today with average CBG 196, min 72 and max 322.  Plan: Check Hgb A1c in 2 months.

## 2014-10-04 NOTE — Assessment & Plan Note (Signed)
Completed Flagyl 500 mg 3 times daily 10-day course. Diarrhea resolved.

## 2014-10-04 NOTE — Assessment & Plan Note (Addendum)
Weight stable from discharge 103lbs. Denies SOB or LE edema.  Plan:  -Continue carvedilol 25 mg twice a day -Continue Lasix 40 mg daily as needed

## 2014-10-05 LAB — BASIC METABOLIC PANEL WITH GFR
BUN: 28 mg/dL — AB (ref 6–23)
CHLORIDE: 100 meq/L (ref 96–112)
CO2: 22 mEq/L (ref 19–32)
CREATININE: 3.08 mg/dL — AB (ref 0.50–1.10)
Calcium: 8.3 mg/dL — ABNORMAL LOW (ref 8.4–10.5)
GFR, EST AFRICAN AMERICAN: 16 mL/min — AB
GFR, Est Non African American: 14 mL/min — ABNORMAL LOW
GLUCOSE: 218 mg/dL — AB (ref 70–99)
Potassium: 3.6 mEq/L (ref 3.5–5.3)
Sodium: 134 mEq/L — ABNORMAL LOW (ref 135–145)

## 2014-10-07 NOTE — Addendum Note (Signed)
Addended by: Neomia DearPOWERS, Zoeya Gramajo E on: 10/07/2014 06:39 PM   Modules accepted: Orders

## 2014-10-15 ENCOUNTER — Other Ambulatory Visit: Payer: Self-pay | Admitting: Internal Medicine

## 2014-10-15 ENCOUNTER — Ambulatory Visit (INDEPENDENT_AMBULATORY_CARE_PROVIDER_SITE_OTHER): Payer: Medicare Other | Admitting: Internal Medicine

## 2014-10-15 ENCOUNTER — Encounter: Payer: Self-pay | Admitting: Internal Medicine

## 2014-10-15 VITALS — BP 129/62 | HR 70 | Temp 98.2°F | Ht 59.0 in | Wt 104.2 lb

## 2014-10-15 DIAGNOSIS — Z79899 Other long term (current) drug therapy: Secondary | ICD-10-CM | POA: Diagnosis not present

## 2014-10-15 DIAGNOSIS — N185 Chronic kidney disease, stage 5: Secondary | ICD-10-CM

## 2014-10-15 DIAGNOSIS — I10 Essential (primary) hypertension: Secondary | ICD-10-CM

## 2014-10-15 DIAGNOSIS — Z7189 Other specified counseling: Secondary | ICD-10-CM | POA: Insufficient documentation

## 2014-10-15 DIAGNOSIS — I120 Hypertensive chronic kidney disease with stage 5 chronic kidney disease or end stage renal disease: Secondary | ICD-10-CM | POA: Diagnosis not present

## 2014-10-15 DIAGNOSIS — J209 Acute bronchitis, unspecified: Secondary | ICD-10-CM | POA: Diagnosis present

## 2014-10-15 DIAGNOSIS — E118 Type 2 diabetes mellitus with unspecified complications: Secondary | ICD-10-CM

## 2014-10-15 DIAGNOSIS — E1122 Type 2 diabetes mellitus with diabetic chronic kidney disease: Secondary | ICD-10-CM

## 2014-10-15 DIAGNOSIS — J438 Other emphysema: Secondary | ICD-10-CM

## 2014-10-15 MED ORDER — ALBUTEROL SULFATE HFA 108 (90 BASE) MCG/ACT IN AERS: INHALATION_SPRAY | RESPIRATORY_TRACT | Status: DC

## 2014-10-15 MED ORDER — INSULIN ASPART 100 UNIT/ML FLEXPEN: PEN_INJECTOR | SUBCUTANEOUS | Status: DC

## 2014-10-15 NOTE — Assessment & Plan Note (Signed)
Lab Results  Component Value Date   HGBA1C 6.9 09/03/2014   HGBA1C 7.3 06/19/2014   HGBA1C 8.1* 04/21/2014     Assessment: Diabetes control: good control (HgbA1C at goal) Progress toward A1C goal:  at goal Comments: Patient's blood sugars mostly ranging in 200s-300s. Patient reports she is taking Lantus 5 units at bedtime, but I discovered today she is taking her Novolog infrequently. She states if she feels dizzy she will not take her Novolog. She reports she will only take the Novolog if her blood sugars are in 300 range. When I asked patient how frequently she is becoming dizzy she states "only every once in awhile." Unclear how many times a day or what doses of Novolog she is taking due to language barrier. I did not see any low blood sugars from her meter. She is supposed to be taking Novolog 8 units with breakfast, 8 units lunch, and 10 units with dinner. Her HbA1c is well controlled so I worry she may be experiencing low blood sugars and this is why she is taking her insulin differently than prescribed.   Plan: Medications:  Change Novolog to 3 units TID with meals. Continue Lantus 5 units QHS. Patient verbalized understanding of these doses as she repeated back to me the doses.  Home glucose monitoring: Frequency:  2x per day Timing:  Before breakfast and before lunch/dinner  Instruction/counseling given: reminded to bring blood glucose meter & log to each visit, reminded to bring medications to each visit and discussed diet Self management tools provided: copy of home glucose meter download Other plans:  - Emphasized to patient the importance of taking Novolog with meals to prevent hyperglycemia - Discussed symptoms of low blood sugar and what to do if she becomes hypoglycemic - Check orthostatics>>negative - Repeat HbA1c in 2 months - Follow up appt in 2 weeks to assess blood sugars after insulin changes

## 2014-10-15 NOTE — Progress Notes (Signed)
   Subjective:    Patient ID: Bianca [name], female    DOB: 07-Jun-1930, 81 y.o.   MRN: [id number]  HPI Bianca [name] is an 81yo woman with PMHx of CKD Stage 5, recurrent UTI, pAF, CAD s/p CABG, CHF, type 2 diabetes, HTN, hyperlipidemia, chronic normocytic anemia, COPD, GERD presenting for follow-up. History was obtained through an interpreter.  Please refer to A&P documentation.     Review of Systems General: Denies fever, chills, night sweats, changes in weight, changes in appetite HEENT: Denies headaches, ear pain, changes in vision CV: Denies orthopnea Pulm: See A&P documentation  GI: Denies abdominal pain, nausea, vomiting, diarrhea, constipation, melena, hematochezia GU: Denies dysuria, hematuria, frequency Msk: Denies muscle cramps, joint pains Neuro: Denies weakness, numbness, tingling Skin: Denies rashes, bruising    Objective:   Physical Exam General: elderly woman sitting up in chair, NAD HEENT: [place]/AT, EOMI, sclera anicteric, pharynx non-erythematous, mucus membranes moist Neck: supple,  no lymphadenopathy CV: RRR, no m/g/r Pulm: CTA bilaterally, breaths non-labored Abd: BS+, soft, non-tender, non-distended Ext: warm, no edema Neuro: alert and oriented x 3, no focal deficits     Assessment & Plan:  Please refer to A&P documentation.

## 2014-10-15 NOTE — Assessment & Plan Note (Signed)
BP Readings from Last 3 Encounters:  10/15/14 129/62  10/04/14 135/68  09/28/14 135/65    Lab Results  Component Value Date   NA 134* 10/04/2014   K 3.6 10/04/2014   CREATININE 3.08* 10/04/2014    Assessment: Blood pressure control: controlled Progress toward BP goal:  at goal Comments: BP well controlled.   Plan: Medications:  Continue Amlodipine 10 mg daily, Coreg 25 mg BID, Hydralazine 25 mg daily, and Lasix 40 mg as needed for leg swelling or SOB.

## 2014-10-15 NOTE — Patient Instructions (Signed)
It was a pleasure seeing you today, Bianca [name].  - Take Lantus 5 units at bedtime - Take Novolog 3 units at breakfast, lunch, and dinner - Appointment in 2 weeks to see how blood sugars are doing - For your cough, continue taking tessalon pearls. Try drinking tea and using cough drops.   General Instructions:   Thank you for bringing your medicines today. This helps [place] keep you safe from mistakes.   Progress Toward Treatment Goals:  Treatment Goal 10/04/2014  Hemoglobin A1C at goal  Blood pressure at goal  Prevent falls -    Self Care Goals & Plans:  Self Care Goal 10/04/2014  Manage my medications take my medicines as prescribed; refill my medications on time; bring my medications to every visit  Monitor my health keep track of my blood glucose; bring my glucose meter and log to each visit; check my feet daily  Eat healthy foods drink diet soda or water instead of juice or soda; eat more vegetables; eat foods that are low in salt; eat baked foods instead of fried foods  Be physically active find an activity I enjoy  Meeting treatment goals -    Home Blood Glucose Monitoring 08/14/2014  Check my blood sugar 3 times a day  When to check my blood sugar -     Care Management & Community Referrals:  Referral 08/13/2013  Referrals made for care management support none needed  Referrals made to community resources none

## 2014-10-15 NOTE — Assessment & Plan Note (Addendum)
I had a long discussion today with the patient and one of her daughters with the interpreter present in regards to her wishes if she were not to have medical decision making capacity. Patient reported that if she were to have cardiac arrest that she would not want anything to be done. She stated through the interpreter "If I am dead, just let me be dead." She stated this included chest compressions and intubation. However, after further discussion the patient stated that these decisions would be up to her children. The daughter who was present states her sister would be the [facility] if necessary. I discussed with the patient that she needs to have this discussion with her family and confirm what her wishes are and that her family is in agreement. The patient then went on to say that she would want the doctor to make these decisions if necessary. I explained that I would like for her to speak with her family in the next 2 weeks and think about what she would like to be done if this situation would arise and then we would retouch on this subject at her next appointment. She agreed to this plan.

## 2014-10-15 NOTE — Assessment & Plan Note (Signed)
Patient was seen about 2 weeks ago for acute bronchitis. Patient notes that she started to feel better after taking Tessalon pearls for her cough, but then for the past 3 days she has been more tired than usual and with a "itchy" throat and dry cough. She reports she feels "unwell" in general and that she gets a warm sensation in her chest. She denies actual chest pain, SOB, nausea, vomiting, or diaphoresis. She denies any fevers, chills, rhinorrhea, and wheezing.   Assessment: Patient likely still recovering from acute bronchitis episode. Her tiredness could be associated with her anemia. She had denied any active bleeding. Possible that she could be developing a viral infection but she was afebrile, lungs clear on exam, and pharynx non-erythematous.   Plan: - Suggested continuing Tessalon pearls for cough - Recommended hot tea and cough drops for sore throat - Will have follow up in 2 weeks and will reassess at that point

## 2014-10-18 ENCOUNTER — Telehealth: Payer: Self-pay | Admitting: Internal Medicine

## 2014-10-18 NOTE — Progress Notes (Signed)
Internal Medicine Clinic Attending  Case discussed with Dr. Rivet at the time of the visit.  We reviewed the resident's history and exam and pertinent patient test results.  I agree with the assessment, diagnosis, and plan of care documented in the resident's note.  

## 2014-10-18 NOTE — Telephone Encounter (Signed)
   Reason for call:   I received a call from [name] daughter at 9:30  PM indicating that her mother's blood sugars are in the 200's with no symptoms.   Pertinent Data:   She was recently seen by her PCP on 10/15/14 and instructed to change her Novolog from 8 units with breakfast, 8 units lunch, and 10 units with dinner to Novolog 3 U TID with meals and continue Lantus 5 U daily.  Her last A1c was 6.9 on 09/03/14.     Assessment / Plan / Recommendations:   Due to persistent hyperglycemia, I instructed her to increase her Novolog from 3 U TID with meals to 6 U TID with meals and continue Lantus 5 U daily. She is to continue to check her blood sugars and return to clinic if blood sugars remain uncontrolled.   As always, pt is advised that if symptoms worsen or new symptoms arise, they should go to an urgent care facility or to go to the  ER for further evaluation.   Otis Brace, MD   10/18/2014, 9:23 PM

## 2014-10-30 ENCOUNTER — Other Ambulatory Visit: Payer: Self-pay | Admitting: *Deleted

## 2014-10-30 MED ORDER — HYDRALAZINE HCL 25 MG PO TABS: 25.0000 mg | ORAL_TABLET | Freq: Three times a day (TID) | ORAL | Status: DC

## 2014-10-30 MED ORDER — VITAMIN D 1000 UNITS PO TABS: 2000.0000 [IU] | ORAL_TABLET | Freq: Every day | ORAL | Status: AC

## 2014-10-30 NOTE — Telephone Encounter (Signed)
Need qty #60 for 2 tabs daily. Thanks

## 2014-11-05 ENCOUNTER — Encounter: Payer: Self-pay | Admitting: Internal Medicine

## 2014-11-05 ENCOUNTER — Ambulatory Visit (INDEPENDENT_AMBULATORY_CARE_PROVIDER_SITE_OTHER): Payer: Medicare Other | Admitting: Internal Medicine

## 2014-11-05 VITALS — BP 156/66 | HR 66 | Temp 98.5°F | Wt 104.3 lb

## 2014-11-05 DIAGNOSIS — Z794 Long term (current) use of insulin: Secondary | ICD-10-CM | POA: Diagnosis not present

## 2014-11-05 DIAGNOSIS — J209 Acute bronchitis, unspecified: Secondary | ICD-10-CM

## 2014-11-05 DIAGNOSIS — Z7189 Other specified counseling: Secondary | ICD-10-CM

## 2014-11-05 DIAGNOSIS — M79652 Pain in left thigh: Secondary | ICD-10-CM

## 2014-11-05 DIAGNOSIS — N185 Chronic kidney disease, stage 5: Secondary | ICD-10-CM | POA: Diagnosis not present

## 2014-11-05 DIAGNOSIS — E1122 Type 2 diabetes mellitus with diabetic chronic kidney disease: Secondary | ICD-10-CM

## 2014-11-05 DIAGNOSIS — I120 Hypertensive chronic kidney disease with stage 5 chronic kidney disease or end stage renal disease: Secondary | ICD-10-CM | POA: Diagnosis not present

## 2014-11-05 DIAGNOSIS — M79651 Pain in right thigh: Secondary | ICD-10-CM | POA: Insufficient documentation

## 2014-11-05 DIAGNOSIS — I10 Essential (primary) hypertension: Secondary | ICD-10-CM

## 2014-11-05 MED ORDER — CAPSAICIN-MENTHOL-METHYL SAL 0.025-1-12 % EX CREA: 1.0000 "application " | TOPICAL_CREAM | Freq: Two times a day (BID) | CUTANEOUS | Status: DC

## 2014-11-05 MED ORDER — FUROSEMIDE 40 MG PO TABS: 40.0000 mg | ORAL_TABLET | ORAL | Status: DC | PRN

## 2014-11-05 MED ORDER — BENZONATATE 100 MG PO CAPS: 100.0000 mg | ORAL_CAPSULE | Freq: Three times a day (TID) | ORAL | Status: DC | PRN

## 2014-11-05 NOTE — Patient Instructions (Signed)
It was a pleasure seeing you today, Bianca [name].   - Continue tylenol for your right leg pain. You can take up to 1000 mg three times daily - Try capsaicin cream to the affected area twice daily - I have put in social work consult so that you can talk about advanced directives and filling out forms with Bianca [name]. You will be called to schedule an appointment.   General Instructions:   Thank you for bringing your medicines today. This helps us keep you safe from mistakes.   Progress Toward Treatment Goals:  Treatment Goal 10/15/2014  Hemoglobin A1C at goal  Blood pressure at goal  Prevent falls -    Self Care Goals & Plans:  Self Care Goal 11/05/2014  Manage my medications take my medicines as prescribed; bring my medications to every visit; refill my medications on time; follow the sick day instructions if I am sick  Monitor my health keep track of my blood glucose; bring my glucose meter and log to each visit; check my feet daily  Eat healthy foods eat more vegetables; eat fruit for snacks and desserts; eat baked foods instead of fried foods; eat smaller portions; drink diet soda or water instead of juice or soda  Be physically active find an activity I enjoy  Meeting treatment goals -    Home Blood Glucose Monitoring 08/14/2014  Check my blood sugar 3 times a day  When to check my blood sugar -     Care Management & Community Referrals:  Referral 08/13/2013  Referrals made for care management support none needed  Referrals made to community resources none

## 2014-11-05 NOTE — Assessment & Plan Note (Signed)
Patient complains of right thigh and buttock pain today. She describes the pain as "sore" and only occurs when she is walking. She is unable to qualify the pain in more detail. She states the pain is located in her right buttock and then extends down the lateral side of her right leg to her knee. She states she has been using Tylenol for her pain with relief. She denies any difficulty lifting up her legs.   Assessment/Plan: Right thigh pain seems musculoskeletal in nature. Possibly meralgia paresthetica given the location.  - Recommended to continue Tylenol - Also gave prescription for capsaicin cream to be applied twice daily to the affected area - Continue to monitor

## 2014-11-05 NOTE — Assessment & Plan Note (Signed)
We continued our discussion of advanced directives/HCPOA today. Patient states she discussed her wishes with her family and that everyone agreed that any decisions should be up the doctor and in particular, myself. I explained to the patient that I feel she needs to discuss this more with her family because many times what the patient and family want can be different than what the doctor thinks is best. She agreed to discuss this more with her family. She did express that she does not wish to be intubated. She seems unsure about chest compressions. Since this is a difficult conversation to have, especially with the language barrier, I have consulted social work to help facilitate this conversation further and have the patient fill out an advanced directive form. I believe that she would benefit most from being DNR, but would like for her and her family to come to this decision. I have also asked her to discuss with her children who should be the HCPOA if she is unable to make decisions. She agreed to meet with Edson SnowballShana, our social worker, in the next few weeks and fill out an advanced directive so that these decisions can be more finalized.

## 2014-11-05 NOTE — Assessment & Plan Note (Signed)
Lab Results  Component Value Date   HGBA1C 6.9 09/03/2014   HGBA1C 7.3 06/19/2014   HGBA1C 8.1* 04/21/2014     Assessment: Diabetes control: good control (HgbA1C at goal) Progress toward A1C goal:  at goal Comments: Per meter download, AM blood sugars running in 130s-170s, lunch time between 200s-300s, and evenings in 220s-300s. No hypoglycemia noted. Patient denies any hypoglycemic symptoms. Lowest blood sugar 89, but only one below 100. Patient states she has been taking Lantus 5 units QHS and Novolog 6 units (increased by phone after our last visit) TID with meals.   Plan: Medications:  Increase Lantus to 6 units QHS and continue Novolog 6 units TID with meals. If patient gets hypoglycemia with increased Lantus may consider increasing lunch/evening Novolog to 7 units.  Home glucose monitoring: Frequency: 3 times a day Timing: before meals Instruction/counseling given: reminded to bring blood glucose meter & log to each visit and reminded to bring medications to each visit Educational resources provided: brochure, handout, video Self management tools provided: copy of home glucose meter download Other plans:  - Discussed symptoms of hypoglycemia - Check HbA1c next month - f/u in 2-3 weeks to assess blood sugars

## 2014-11-05 NOTE — Assessment & Plan Note (Signed)
BP Readings from Last 3 Encounters:  11/05/14 156/66  10/15/14 129/62  10/04/14 135/68    Lab Results  Component Value Date   NA 134* 10/04/2014   K 3.6 10/04/2014   CREATININE 3.08* 10/04/2014    Assessment: Blood pressure control: mildly elevated Progress toward BP goal:  deteriorated Comments: BP slightly elevated today. Patient states she walked from the ED and thinks that's why her BP is high. Will recheck at next visit. She has been normotensive in the past.   Plan: Medications:  Continue Amlodipine 10 mg daily, Coreg 25 mg BID, Hydralazine 25 mg TID, and Lasix 40 mg as needed for leg swelling or SOB.  Educational resources provided: brochure, handout, video Other plans: BP recheck at next visit.

## 2014-11-05 NOTE — Progress Notes (Signed)
   Subjective:    Patient ID: Donavan BurnetJieh T Treaster, female    DOB: 08-12-1930, 81 y.o.   MRN: [id number]  HPI [name] is a 81yo woman with PMHx of CKD Stage 5, recurrent UTI, pAF, CAD s/p CABG, CHF, type 2 diabetes, HTN, hyperlipidemia, chronic normocytic anemia, COPD, GERD presenting for follow-up. History was obtained through an interpreter.  Refer to A&P documentation.   Review of Systems General: Denies fever, chills, night sweats, changes in weight, changes in appetite HEENT: Denies headaches, ear pain, changes in vision, rhinorrhea, sore throat CV: Denies CP, palpitations, SOB, orthopnea Pulm: Denies SOB, cough, wheezing GI: Denies abdominal pain, nausea, vomiting, diarrhea, constipation, melena, hematochezia GU: Denies dysuria, hematuria, frequency Msk: Reports muscle pains in her right upper thigh and buttocks. Neuro: Denies weakness, numbness, tingling Skin: Denies rashes, bruising    Objective:   Physical Exam General: sitting up in chair, NAD HEENT: [place]/AT, EOMI, sclera anicteric, mucus membranes moist CV: RRR, no m/g/r Pulm: CTA bilaterally, breaths non-labored Abd: BS+, soft, non-tender Ext: warm, no edema, no tenderness to palpation of lower extremities. ROM not limited in any extremity.  Neuro: alert and oriented x 3. Strength 5/5 in all extremities. Patient is able to lift her lower extremities off the bed without any difficulties.      Assessment & Plan:  Please refer to A&P documentation.

## 2014-11-08 ENCOUNTER — Encounter: Payer: Self-pay | Admitting: Internal Medicine

## 2014-11-11 NOTE — Progress Notes (Signed)
Internal Medicine Clinic Attending  Case discussed with Dr. Rivet at the time of the visit.  We reviewed the resident's history and exam and pertinent patient test results.  I agree with the assessment, diagnosis, and plan of care documented in the resident's note.  

## 2014-11-13 ENCOUNTER — Telehealth: Payer: Self-pay | Admitting: Licensed Clinical Social Worker

## 2014-11-13 NOTE — Telephone Encounter (Signed)
CSW placed call.  [place] interpreter states pt unable to understand him and states she prefers Cong[place]inese better.  [place] interpreter unable to interact with patient over the phone.  Call placed with Mandarin interpreter, pt states unable to understand mandarin.  Pt's daughter on the phone at this time and states pt could not hear the [place] interpreter and will not be able to understand Mandarin.  CSW spoke directly with daughter in [place], confirmed appointment for 7/19 at 2pm with CSW then physician.  Daughter aware and states she will have her sister come as she speaks [place] better.  CSW will speak with pt in person on 11/19/14.

## 2014-11-13 NOTE — Telephone Encounter (Signed)
[name] was referred to CSW for assistance with Advanced Directives.  CSW placed call to pt's home.  Pt not at home at this time anticipated to be back around 1:20pm.  CSW will try again today.

## 2014-11-18 ENCOUNTER — Telehealth: Payer: Self-pay | Admitting: Internal Medicine

## 2014-11-18 NOTE — Telephone Encounter (Addendum)
Call to patient to confirm appointment for 11/19/14 at 2:00 phone not available

## 2014-11-19 ENCOUNTER — Encounter: Payer: Self-pay | Admitting: Licensed Clinical Social Worker

## 2014-11-19 ENCOUNTER — Encounter: Payer: Self-pay | Admitting: Internal Medicine

## 2014-11-19 ENCOUNTER — Ambulatory Visit (INDEPENDENT_AMBULATORY_CARE_PROVIDER_SITE_OTHER): Payer: Medicare Other | Admitting: Internal Medicine

## 2014-11-19 VITALS — BP 131/60 | HR 70 | Temp 97.5°F | Wt 105.1 lb

## 2014-11-19 DIAGNOSIS — I480 Paroxysmal atrial fibrillation: Secondary | ICD-10-CM | POA: Diagnosis present

## 2014-11-19 DIAGNOSIS — E785 Hyperlipidemia, unspecified: Secondary | ICD-10-CM | POA: Diagnosis not present

## 2014-11-19 DIAGNOSIS — I132 Hypertensive heart and chronic kidney disease with heart failure and with stage 5 chronic kidney disease, or end stage renal disease: Secondary | ICD-10-CM | POA: Diagnosis not present

## 2014-11-19 DIAGNOSIS — E1122 Type 2 diabetes mellitus with diabetic chronic kidney disease: Secondary | ICD-10-CM

## 2014-11-19 DIAGNOSIS — D649 Anemia, unspecified: Secondary | ICD-10-CM

## 2014-11-19 DIAGNOSIS — Z205 Contact with and (suspected) exposure to viral hepatitis: Secondary | ICD-10-CM | POA: Diagnosis not present

## 2014-11-19 DIAGNOSIS — I5042 Chronic combined systolic (congestive) and diastolic (congestive) heart failure: Secondary | ICD-10-CM | POA: Diagnosis not present

## 2014-11-19 DIAGNOSIS — R109 Unspecified abdominal pain: Secondary | ICD-10-CM | POA: Diagnosis not present

## 2014-11-19 DIAGNOSIS — M549 Dorsalgia, unspecified: Secondary | ICD-10-CM

## 2014-11-19 DIAGNOSIS — Z79899 Other long term (current) drug therapy: Secondary | ICD-10-CM

## 2014-11-19 DIAGNOSIS — Z7982 Long term (current) use of aspirin: Secondary | ICD-10-CM

## 2014-11-19 DIAGNOSIS — Z66 Do not resuscitate: Secondary | ICD-10-CM | POA: Diagnosis not present

## 2014-11-19 DIAGNOSIS — J209 Acute bronchitis, unspecified: Secondary | ICD-10-CM

## 2014-11-19 DIAGNOSIS — M79652 Pain in left thigh: Secondary | ICD-10-CM

## 2014-11-19 DIAGNOSIS — Z7189 Other specified counseling: Secondary | ICD-10-CM | POA: Diagnosis not present

## 2014-11-19 DIAGNOSIS — M79651 Pain in right thigh: Secondary | ICD-10-CM

## 2014-11-19 DIAGNOSIS — N185 Chronic kidney disease, stage 5: Secondary | ICD-10-CM

## 2014-11-19 DIAGNOSIS — Z794 Long term (current) use of insulin: Secondary | ICD-10-CM

## 2014-11-19 DIAGNOSIS — I10 Essential (primary) hypertension: Secondary | ICD-10-CM

## 2014-11-19 LAB — GLUCOSE, CAPILLARY: GLUCOSE-CAPILLARY: 289 mg/dL — AB (ref 65–99)

## 2014-11-19 MED ORDER — GABAPENTIN 100 MG PO CAPS: 100.0000 mg | ORAL_CAPSULE | Freq: Every day | ORAL | Status: DC

## 2014-11-19 MED ORDER — SIMETHICONE 80 MG PO CHEW: 80.0000 mg | CHEWABLE_TABLET | Freq: Four times a day (QID) | ORAL | Status: AC | PRN

## 2014-11-19 NOTE — Patient Instructions (Signed)
-   Start gabapentin 100 mg daily for your back/leg pain - I will refer you to sports medicine if you are interested in a steroid injection for your back - Start taking Gas-X for abdominal bloating/discomfort  - Keep up the great work with your diabetes!!  General Instructions:   Thank you for bringing your medicines today. This helps us keep you safe from mistakes.   Progress Toward Treatment Goals:  Treatment Goal 11/05/2014  Hemoglobin A1C at goal  Blood pressure deteriorated  Prevent falls -    Self Care Goals & Plans:  Self Care Goal 11/19/2014  Manage my medications take my medicines as prescribed; bring my medications to every visit; refill my medications on time  Monitor my health keep track of my blood glucose; bring my glucose meter and log to each visit; keep track of my blood pressure; check my feet daily  Eat healthy foods eat more vegetables; eat foods that are low in salt  Be physically active find an activity I enjoy  Meeting treatment goals -    Home Blood Glucose Monitoring 11/05/2014  Check my blood sugar 3 times a day  When to check my blood sugar before meals     Care Management & Community Referrals:  Referral 11/05/2014  Referrals made for care management support social worker  Referrals made to community resources -

## 2014-11-19 NOTE — Progress Notes (Signed)
CSW met with [name], daughter Bianca [name] and interpreter Boyd Kerbs to discuss Advanced Directives: HCPOA and Living Will.  [name] is [place] speaking only, can not read [place].  [name] alert and oriented x3.  CSW discussed the importance of completing advanced directives, making her wishes known for the family prior to any event occuring.  Pt aware and in agreement to complete.  CSW read through [place] utilizing interpreter.  Answering questions and providing elaboration as needed. HCPOA and Living Will completed.  CSW contacted chaplain for assistance with witness/notary.  Chaplain arrived, review forms for completion.  Advanced Directives signed and notarized.  Provided to PCP to review for completion of MOST form and DNR.

## 2014-11-20 DIAGNOSIS — R109 Unspecified abdominal pain: Secondary | ICD-10-CM | POA: Insufficient documentation

## 2014-11-20 NOTE — Assessment & Plan Note (Signed)
Patient reports she now has bilateral thigh pain. She describes the pain as sharp and starting in her lower back and then extending down both legs. She notes the right side is worst than left. She notes she had back surgery almost 19 years ago and had similar symptoms prior to surgery. She reports the capsaicin cream she was prescribed last visit is not working. Tylenol 1000 mg BID gives her some relief. Pain is worst when walking.   A/P: Pain seems most consistent with osteoarthritis vs spinal stenosis vs possible sciatica (although pain only goes to knee). She has a hx of prior back surgery. Previously thought her pain was more musculoskeletal but seems more neurological upon this examination.  - Start Gabapentin 100 mg daily (renally dosed) - Will refer to sports medicine for steroid injection if pain does not improve with gabapentin - PT can also be considered

## 2014-11-20 NOTE — Assessment & Plan Note (Signed)
Patient reports her cough has almost completely resolved. - Continue tessalon pearls PRN

## 2014-11-20 NOTE — Assessment & Plan Note (Signed)
Lab Results  Component Value Date   HGBA1C 6.9 09/03/2014   HGBA1C 7.3 06/19/2014   HGBA1C 8.1* 04/21/2014     Assessment: Diabetes control: good control (HgbA1C at goal) Progress toward A1C goal:  at goal Comments: Blood sugars much more controlled! Blood sugars ranging in 100s-200s in AM and 100s-160s in PM. Few 300s in PM but most blood sugars in good range. Patient reports she is now taking her insulin as prescribed.  Plan: Medications:  Continue Lantus 6 units QHS and Novolog 6 units TID with meals.  Home glucose monitoring: Frequency: 3 times a day Timing: before meals Instruction/counseling given: reminded to bring blood glucose meter & log to each visit and reminded to bring medications to each visit Self management tools provided: copy of home glucose meter download Other plans:  - f/u in 1 month - Check HbA1c next month

## 2014-11-20 NOTE — Assessment & Plan Note (Signed)
BP Readings from Last 3 Encounters:  11/19/14 131/60  11/05/14 156/66  10/15/14 129/62    Lab Results  Component Value Date   NA 134* 10/04/2014   K 3.6 10/04/2014   CREATININE 3.08* 10/04/2014    Assessment: Blood pressure control: controlled Progress toward BP goal:  at goal Comments: BP well controlled.   Plan: Medications:  Continue Amlodipine 10 mg daily, Coreg 25 mg BID, Hydralazine 25 mg TID, and Lasix 40 mg PRN leg swelling.

## 2014-11-20 NOTE — Assessment & Plan Note (Signed)
Patient met with SW today to go over MOST form. Patient was accompanied by daughter and patient filled out both a MOST form and DNR form. I signed both of these forms and instructed patient and daughter to keep these documents in an accessible place. Patient is happy to have completed these forms and put her wishes into documentation.

## 2014-11-20 NOTE — Assessment & Plan Note (Signed)
Denies any anginal symptoms. Weight stable at 105 lbs. No evidence of volume overload. Educated patient and family on when to take Lasix.  - Continue Coreg 25 mg BID - Continue ASA 81 mg daily - Continue Norvasc 10 mg daily - Continue Hydralazine 25 mg TID - Continue Imdur 120 mg daily - Continue Crestor 20 mg daily  - Continue Lasix 40 mg PRN leg swelling

## 2014-11-20 NOTE — Assessment & Plan Note (Signed)
Diagnosed during most recent admission 5/24-5/28. CHADS-VASc score 7. Cardiology and IM team felt she was not a good candidate for systemic anticoagulation because of her risk of bleeding.  - Continue ASA 81 mg daily

## 2014-11-20 NOTE — Assessment & Plan Note (Signed)
Patient noted to have healthcare exposure to HBV after hospitalization in April 2016. Labs checked at office visit 09/10/14 notable for: - HepB surface antigen positive - HepB core antibody reactive - HepB surface antibody negative - IgM anti-HBc nonreactive - elevated HepB DNA   These labs are consistent with a CHRONIC Hepatitis B infection. This is not surprising as she lived in [place] for many years. Her LFTs were normal in May 2016. Abdominal US in 2005 and CT Abd/Pelvis in Dec 2015 without evidence of cirrhosis. She is HBeAg positive but treatment for patients without cirrhosis is only indicated for those who have HBV DNA >20,000 int. unit/mL (>105 copies/mL) and ALT >2 x upper limit of normal (ULN).   Plan - Continue to monitor LFTs periodically

## 2014-11-20 NOTE — Assessment & Plan Note (Signed)
Patient reports abdominal discomfort that started about 1 week ago. She describes feeling "gasey" and having a mild pain diffusely. She notes she is having normal bowel movements, about once per day. She describes the sensation of feeling bloated. She denies any changes in her diet. Denies associated nausea, vomiting, diarrhea, constipation, melena, and hematochezia. She denies any association with food.   A/P: History seems most consistent with gas pains. She does not appear bloated/distended on exam. No tenderness on exam.  - Will try Gas-X as needed

## 2014-11-20 NOTE — Assessment & Plan Note (Signed)
Patient on Crestor 20 mg daily.  - Will get lipid panel at next visit

## 2014-11-20 NOTE — Assessment & Plan Note (Signed)
Stable. Last Hgb 10.9 on 10/04/14. Her baseline appears to be anywhere between 8-10. She is asymptomatic. She likely has anemia secondary to her CKD Stage 5.  - Continue to monitor

## 2014-11-20 NOTE — Progress Notes (Signed)
   Subjective:    Patient ID: Bianca [name], female    DOB: June 26, 1930, 81 y.o.   MRN: [id number]  HPI Bianca [name] is a 81yo woman with PMHx of CKD Stage 5, recurrent UTI, pAF, CAD s/p CABG, CHF, type 2 diabetes, HTN, hyperlipidemia, chronic normocytic anemia, COPD, GERD presenting for follow-up. History was obtained through an interpreter.  Please refer to A&P documentation.    Review of Systems General: Denies fever, chills, night sweats, changes in weight, changes in appetite HEENT: Denies headaches, ear pain, changes in vision, rhinorrhea, sore throat CV: Denies CP, palpitations, SOB, orthopnea Pulm: Denies SOB, cough, wheezing GI: Reports mild abdominal pain, bloating, nausea. Denies vomiting, diarrhea, constipation, melena, hematochezia GU: Denies dysuria, hematuria, frequency Msk: Reports bilateral thigh pain, back pain.  Neuro: Denies weakness, numbness, tingling Skin: Denies rashes, bruising    Objective:   Physical Exam General: sitting up in chair, NAD, pleasant  HEENT: [place]/AT, EOMI, sclera anicteric, mucus membranes moist CV: RRR, no m/g/r Pulm: CTA bilaterally, breaths non-labored  Abd: BS+, soft, non-tender, non-distended  Ext: warm, no edema. Straight leg test negative bilaterally. No tenderness to palpation of bilateral lower extremities.  Neuro: alert and oriented x 3, no focal deficits. Strength intact in all extremities.     Assessment & Plan:  Refer to A&P documentation.

## 2014-11-25 NOTE — Progress Notes (Signed)
Internal Medicine Clinic Attending  Case discussed with Dr. Rivet soon after the resident saw the patient.  We reviewed the resident's history and exam and pertinent patient test results.  I agree with the assessment, diagnosis, and plan of care documented in the resident's note.  

## 2014-12-07 ENCOUNTER — Encounter (HOSPITAL_COMMUNITY): Payer: Self-pay | Admitting: Family Medicine

## 2014-12-07 ENCOUNTER — Emergency Department (HOSPITAL_COMMUNITY): Payer: Medicare Other

## 2014-12-07 ENCOUNTER — Observation Stay (HOSPITAL_COMMUNITY)
Admission: EM | Admit: 2014-12-07 | Discharge: 2014-12-08 | Disposition: A | Discharge status: Home / Self Care | Payer: Medicare Other | Attending: Internal Medicine | Admitting: Internal Medicine

## 2014-12-07 DIAGNOSIS — E785 Hyperlipidemia, unspecified: Secondary | ICD-10-CM | POA: Insufficient documentation

## 2014-12-07 DIAGNOSIS — J441 Chronic obstructive pulmonary disease with (acute) exacerbation: Secondary | ICD-10-CM | POA: Insufficient documentation

## 2014-12-07 DIAGNOSIS — I480 Paroxysmal atrial fibrillation: Secondary | ICD-10-CM | POA: Diagnosis not present

## 2014-12-07 DIAGNOSIS — I120 Hypertensive chronic kidney disease with stage 5 chronic kidney disease or end stage renal disease: Secondary | ICD-10-CM | POA: Diagnosis not present

## 2014-12-07 DIAGNOSIS — N185 Chronic kidney disease, stage 5: Secondary | ICD-10-CM | POA: Diagnosis not present

## 2014-12-07 DIAGNOSIS — I255 Ischemic cardiomyopathy: Secondary | ICD-10-CM | POA: Diagnosis not present

## 2014-12-07 DIAGNOSIS — Z66 Do not resuscitate: Secondary | ICD-10-CM | POA: Diagnosis not present

## 2014-12-07 DIAGNOSIS — Z7982 Long term (current) use of aspirin: Secondary | ICD-10-CM | POA: Insufficient documentation

## 2014-12-07 DIAGNOSIS — K219 Gastro-esophageal reflux disease without esophagitis: Secondary | ICD-10-CM | POA: Diagnosis not present

## 2014-12-07 DIAGNOSIS — R0600 Dyspnea, unspecified: Secondary | ICD-10-CM | POA: Diagnosis present

## 2014-12-07 DIAGNOSIS — E1122 Type 2 diabetes mellitus with diabetic chronic kidney disease: Secondary | ICD-10-CM | POA: Diagnosis not present

## 2014-12-07 DIAGNOSIS — I2510 Atherosclerotic heart disease of native coronary artery without angina pectoris: Secondary | ICD-10-CM | POA: Diagnosis not present

## 2014-12-07 DIAGNOSIS — J449 Chronic obstructive pulmonary disease, unspecified: Secondary | ICD-10-CM | POA: Diagnosis present

## 2014-12-07 DIAGNOSIS — Z794 Long term (current) use of insulin: Secondary | ICD-10-CM | POA: Insufficient documentation

## 2014-12-07 DIAGNOSIS — I5043 Acute on chronic combined systolic (congestive) and diastolic (congestive) heart failure: Secondary | ICD-10-CM | POA: Diagnosis not present

## 2014-12-07 DIAGNOSIS — Z951 Presence of aortocoronary bypass graft: Secondary | ICD-10-CM | POA: Diagnosis not present

## 2014-12-07 DIAGNOSIS — I5042 Chronic combined systolic (congestive) and diastolic (congestive) heart failure: Secondary | ICD-10-CM | POA: Diagnosis present

## 2014-12-07 DIAGNOSIS — I25718 Atherosclerosis of autologous vein coronary artery bypass graft(s) with other forms of angina pectoris: Secondary | ICD-10-CM | POA: Diagnosis present

## 2014-12-07 DIAGNOSIS — K5900 Constipation, unspecified: Secondary | ICD-10-CM | POA: Diagnosis present

## 2014-12-07 DIAGNOSIS — R079 Chest pain, unspecified: Secondary | ICD-10-CM

## 2014-12-07 LAB — BRAIN NATRIURETIC PEPTIDE: B Natriuretic Peptide: 992.2 pg/mL — ABNORMAL HIGH (ref 0.0–100.0)

## 2014-12-07 LAB — BASIC METABOLIC PANEL
Anion gap: 12 (ref 5–15)
BUN: 44 mg/dL — ABNORMAL HIGH (ref 6–20)
CHLORIDE: 107 mmol/L (ref 101–111)
CO2: 17 mmol/L — AB (ref 22–32)
Calcium: 8.8 mg/dL — ABNORMAL LOW (ref 8.9–10.3)
Creatinine, Ser: 3.45 mg/dL — ABNORMAL HIGH (ref 0.44–1.00)
GFR calc Af Amer: 13 mL/min — ABNORMAL LOW (ref >60)
GFR calc non Af Amer: 12 mL/min — ABNORMAL LOW (ref >60)
Glucose, Bld: 167 mg/dL — ABNORMAL HIGH (ref 65–99)
Potassium: 5 mmol/L (ref 3.5–5.1)
SODIUM: 136 mmol/L (ref 135–145)

## 2014-12-07 LAB — CBC
HEMATOCRIT: 26.8 % — AB (ref 36.0–46.0)
Hemoglobin: 8.7 g/dL — ABNORMAL LOW (ref 12.0–15.0)
MCH: 30.2 pg (ref 26.0–34.0)
MCHC: 32.5 g/dL (ref 30.0–36.0)
MCV: 93.1 fL (ref 78.0–100.0)
Platelets: 115 10*3/uL — ABNORMAL LOW (ref 150–400)
RBC: 2.88 MIL/uL — ABNORMAL LOW (ref 3.87–5.11)
RDW: 14 % (ref 11.5–15.5)
WBC: 6.3 10*3/uL (ref 4.0–10.5)

## 2014-12-07 LAB — I-STAT TROPONIN, ED: Troponin i, poc: 0.01 ng/mL (ref 0.00–0.08)

## 2014-12-07 MED ORDER — PANTOPRAZOLE SODIUM 40 MG PO TBEC
40.0000 mg | DELAYED_RELEASE_TABLET | Freq: Every day | ORAL | Status: DC
  Administered 2014-12-08: 40 mg via ORAL
  Filled 2014-12-07: qty 1

## 2014-12-07 MED ORDER — HYDRALAZINE HCL 25 MG PO TABS
25.0000 mg | ORAL_TABLET | Freq: Three times a day (TID) | ORAL | Status: DC
  Administered 2014-12-08 (×2): 25 mg via ORAL
  Filled 2014-12-07 (×4): qty 1

## 2014-12-07 MED ORDER — ALBUTEROL SULFATE (2.5 MG/3ML) 0.083% IN NEBU: 2.5000 mg | INHALATION_SOLUTION | RESPIRATORY_TRACT | Status: DC | PRN

## 2014-12-07 MED ORDER — ISOSORBIDE MONONITRATE ER 60 MG PO TB24
120.0000 mg | ORAL_TABLET | Freq: Every day | ORAL | Status: DC
  Administered 2014-12-08: 120 mg via ORAL
  Filled 2014-12-07: qty 2

## 2014-12-07 MED ORDER — ROSUVASTATIN CALCIUM 20 MG PO TABS
20.0000 mg | ORAL_TABLET | Freq: Every day | ORAL | Status: DC
  Filled 2014-12-07: qty 1

## 2014-12-07 MED ORDER — ASPIRIN EC 81 MG PO TBEC
81.0000 mg | DELAYED_RELEASE_TABLET | Freq: Every day | ORAL | Status: DC
  Administered 2014-12-08: 81 mg via ORAL
  Filled 2014-12-07: qty 1

## 2014-12-07 MED ORDER — INSULIN GLARGINE 100 UNIT/ML ~~LOC~~ SOLN
5.0000 [IU] | Freq: Every day | SUBCUTANEOUS | Status: DC
  Administered 2014-12-08: 5 [IU] via SUBCUTANEOUS
  Filled 2014-12-07 (×2): qty 0.05

## 2014-12-07 MED ORDER — SIMETHICONE 80 MG PO CHEW
80.0000 mg | CHEWABLE_TABLET | Freq: Four times a day (QID) | ORAL | Status: DC | PRN
  Filled 2014-12-07: qty 1

## 2014-12-07 MED ORDER — CARVEDILOL 25 MG PO TABS
25.0000 mg | ORAL_TABLET | Freq: Two times a day (BID) | ORAL | Status: DC
  Administered 2014-12-08: 25 mg via ORAL
  Filled 2014-12-07 (×3): qty 1

## 2014-12-07 MED ORDER — VITAMIN D3 25 MCG (1000 UNIT) PO TABS
2000.0000 [IU] | ORAL_TABLET | Freq: Every day | ORAL | Status: DC
  Administered 2014-12-08: 2000 [IU] via ORAL
  Filled 2014-12-07: qty 2

## 2014-12-07 MED ORDER — SODIUM BICARBONATE 650 MG PO TABS
650.0000 mg | ORAL_TABLET | Freq: Two times a day (BID) | ORAL | Status: DC
  Administered 2014-12-08 (×2): 650 mg via ORAL
  Filled 2014-12-07 (×3): qty 1

## 2014-12-07 MED ORDER — ASPIRIN 81 MG PO CHEW
324.0000 mg | CHEWABLE_TABLET | Freq: Once | ORAL | Status: AC
  Administered 2014-12-07: 324 mg via ORAL
  Filled 2014-12-07: qty 4

## 2014-12-07 MED ORDER — HEPARIN SODIUM (PORCINE) 5000 UNIT/ML IJ SOLN: 5000.0000 [IU] | Freq: Three times a day (TID) | INTRAMUSCULAR | Status: DC

## 2014-12-07 MED ORDER — MORPHINE SULFATE 2 MG/ML IJ SOLN
2.0000 mg | INTRAMUSCULAR | Status: DC | PRN
  Administered 2014-12-08: 2 mg via INTRAVENOUS
  Filled 2014-12-07: qty 1

## 2014-12-07 MED ORDER — FUROSEMIDE 10 MG/ML IJ SOLN
80.0000 mg | Freq: Once | INTRAMUSCULAR | Status: AC
  Administered 2014-12-07: 80 mg via INTRAVENOUS
  Filled 2014-12-07: qty 8

## 2014-12-07 MED ORDER — GABAPENTIN 100 MG PO CAPS
100.0000 mg | ORAL_CAPSULE | Freq: Every day | ORAL | Status: DC
  Administered 2014-12-08: 100 mg via ORAL
  Filled 2014-12-07: qty 1

## 2014-12-07 MED ORDER — NITROGLYCERIN 0.4 MG SL SUBL: 0.4000 mg | SUBLINGUAL_TABLET | SUBLINGUAL | Status: DC | PRN

## 2014-12-07 MED ORDER — FLUTICASONE PROPIONATE HFA 44 MCG/ACT IN AERO
1.0000 | INHALATION_SPRAY | Freq: Two times a day (BID) | RESPIRATORY_TRACT | Status: DC
  Filled 2014-12-07: qty 10.6

## 2014-12-07 MED ORDER — INSULIN ASPART 100 UNIT/ML ~~LOC~~ SOLN
0.0000 [IU] | Freq: Three times a day (TID) | SUBCUTANEOUS | Status: DC
  Administered 2014-12-08: 2 [IU] via SUBCUTANEOUS

## 2014-12-07 MED ORDER — ACETAMINOPHEN 500 MG PO TABS: 1000.0000 mg | ORAL_TABLET | Freq: Three times a day (TID) | ORAL | Status: DC | PRN

## 2014-12-07 MED ORDER — AMLODIPINE BESYLATE 10 MG PO TABS
10.0000 mg | ORAL_TABLET | Freq: Every day | ORAL | Status: DC
  Administered 2014-12-08: 10 mg via ORAL
  Filled 2014-12-07: qty 1

## 2014-12-07 MED ORDER — MORPHINE SULFATE 2 MG/ML IJ SOLN
2.0000 mg | Freq: Once | INTRAMUSCULAR | Status: AC
  Administered 2014-12-07: 2 mg via INTRAVENOUS
  Filled 2014-12-07: qty 1

## 2014-12-07 NOTE — ED Provider Notes (Signed)
CSN: [id number]     Arrival date & time 12/07/14  1808 History   First MD Initiated Contact with Patient 12/07/14 1834     Chief Complaint  Patient presents with  . Chest Pain  . Shortness of Breath     (Consider location/radiation/quality/duration/timing/severity/associated sxs/prior Treatment) Patient is a 81 y.o. female presenting with chest pain and shortness of breath. The history is provided by the patient.  Chest Pain Pain location:  Substernal area Pain quality: tightness   Pain radiates to:  Does not radiate Pain radiates to the back: no   Pain severity:  Moderate Onset quality:  Sudden Timing:  Constant Progression:  Unchanged Chronicity:  New Context: at rest   Relieved by:  Nothing Worsened by:  Nothing tried Associated symptoms: shortness of breath   Associated symptoms: no fever   Shortness of Breath Associated symptoms: chest pain   Associated symptoms: no fever     Past Medical History  Diagnosis Date  . GERD (gastroesophageal reflux disease)   . Hepatitis B   . Hyperlipemia   . Hypertension   . Cervical dysplasia   . Atrophic vaginitis   . Spinal stenosis     s/p decompression  . CAD (coronary artery disease) April 2008    s/p CABG;    cath 8/12:  LM patent, pLAD occluded, OM1 30-40%, pOM2 80% and 60% after the anastomosis, pRCA occluded, S-DX occluded (old), S-OM 2 occluded (new), L-LAD patent, dLAD provided collats to the PDA; Med Rx  rec.; consider PCI to OM2 if fails med Rx  . Hx of hysterectomy     daughter, Rocky Crafts, denies this hx on 08/23/2014  . UTI (lower urinary tract infection)   . Diverticulosis of intestine 04/29/2014    Of ascending colon noted on CT abd/ pelvis 04/20/14  . Diabetes mellitus, type 2     insulin dependent  . COPD (chronic obstructive pulmonary disease)     Hattie Perch 08/23/2014  . Chronic kidney disease (CKD), stage IV (severe)     Hattie Perch 08/23/2014  . CHF (congestive heart failure)     Hattie Perch 08/23/2014  . Chronic lower back  pain   . Shortness of breath dyspnea   . UTI (urinary tract infection) 09/2014   Past Surgical History  Procedure Laterality Date  . Esophagogastroduodenoscopy Left 04/22/2014    Procedure: ESOPHAGOGASTRODUODENOSCOPY (EGD);  Surgeon: Shirley Friar, MD;  Location: [facility] ENDOSCOPY;  Service: Endoscopy;  Laterality: Left;  . Total abdominal hysterectomy      cervical dysplasia; daughter, Rocky Crafts denies this hx on 08/23/2014  . Back surgery    . Lumbar laminectomy/decompression microdiscectomy  02/2000    Central and lateral decompression L3-4, L4-5, L5-S1./notes 09/15/2010  . Coronary artery bypass graft  April 2008    "CABG X5"  . Cardiac catheterization  08/2006; 09/2006; 2011,  12/2010    Hattie Perch 5/15/2012Marland Kitchen Hattie Perch 5/2/2012Marland Kitchen Hattie Perch 09/14/2010   History reviewed. No pertinent family history. History  Substance Use Topics  . Smoking status: Never Smoker   . Smokeless tobacco: Never Used  . Alcohol Use: No   OB History    No data available     Review of Systems  Constitutional: Negative for fever and chills.  Respiratory: Positive for shortness of breath.   Cardiovascular: Positive for chest pain.  All other systems reviewed and are negative.     Allergies  Cozaar and Lisinopril  Home Medications   Prior to Admission medications   Medication Sig Start Date End Date Taking?  Authorizing Provider  acetaminophen (TYLENOL) 500 MG tablet Take 2 tablets (1,000 mg total) by mouth every 8 (eight) hours as needed for mild pain. 12/25/13 12/25/14  Su Hoff, MD  albuterol (PROAIR HFA) 108 (90 BASE) MCG/ACT inhaler INHALE 1 PUFF EVERY 6 HOURS AS NEEDED FOR WHEEZE 10/15/14   Carly J Rivet, MD  amLODipine (NORVASC) 10 MG tablet Take 1 tablet (10 mg total) by mouth daily. 08/14/14   Gust Rung, DO  aspirin EC 81 MG tablet TAKE 1 TABLET BY MOUTH EVERY DAY 01/03/13   Lorretta Harp, MD  B-D ULTRAFINE III SHORT PEN 31G X 8 MM MISC USE AS DIRECTED TO INJECT INSULIN 3 TIMES A DAY 03/12/14   Su Hoff, MD  BAYER MICROLET LANCETS lancets Use as instructed 08/14/14   Gust Rung, DO  benzonatate (TESSALON PERLES) 100 MG capsule Take 1 capsule (100 mg total) by mouth 3 (three) times daily as needed for cough. 11/05/14 11/05/15  Carly J Rivet, MD  carvedilol (COREG) 25 MG tablet TAKE 1 TABLET (25 MG TOTAL) BY MOUTH 2 (TWO) TIMES DAILY WITH A MEAL. 04/23/14   Su Hoff, MD  cholecalciferol (VITAMIN D) 1000 UNITS tablet Take 2 tablets (2,000 Units total) by mouth daily. 10/30/14   Carly J Rivet, MD  CRESTOR 20 MG tablet TAKE 1 TABLET (20 MG TOTAL) BY MOUTH DAILY. 11/22/13   Levert Feinstein, MD  fluticasone (FLOVENT HFA) 44 MCG/ACT inhaler Inhale 1 puff into the lungs 2 (two) times daily. 06/19/14   Harold Barban, MD  furosemide (LASIX) 40 MG tablet Take 1 tablet (40 mg total) by mouth as needed (leg swelling, shortness of breath). 11/05/14   Su Hoff, MD  gabapentin (NEURONTIN) 100 MG capsule Take 1 capsule (100 mg total) by mouth daily. 11/19/14 11/19/15  Carly J Rivet, MD  glucose blood (BAYER CONTOUR NEXT TEST) test strip 1 each by Other route as needed for other. Use 4 to 5 times daily to check blood sugar. diag code E11.9. Insulin dependent 08/14/14   Gust Rung, DO  hydrALAZINE (APRESOLINE) 25 MG tablet Take 1 tablet (25 mg total) by mouth 3 (three) times daily. 10/30/14   Carly Arlyce Harman, MD  insulin aspart (NOVOLOG FLEXPEN) 100 UNIT/ML FlexPen Please take 3 units with breakfast, 3 units with lunch, and 3 units with dinner/supper 10/15/14   Carly J Rivet, MD  insulin glargine (LANTUS) 100 UNIT/ML injection Inject 0.05 mLs (5 Units total) into the skin at bedtime. 08/27/14   Harold Barban, MD  isosorbide mononitrate (IMDUR) 120 MG 24 hr tablet Take 120 mg by mouth daily. 09/04/14   Historical Provider, MD  nitroGLYCERIN (NITROSTAT) 0.4 MG SL tablet Place 0.4 mg under the tongue every 5 (five) minutes as needed. For chest pain. Maximum of 3 doses    Historical Provider, MD  omeprazole (PRILOSEC)  20 MG capsule TAKE 1 CAPSULE (20 MG TOTAL) BY MOUTH DAILY. 07/30/14   Su Hoff, MD  simethicone (GAS-X) 80 MG chewable tablet Chew 1 tablet (80 mg total) by mouth every 6 (six) hours as needed for flatulence. 11/19/14   Carly Arlyce Harman, MD  sodium bicarbonate 650 MG tablet Take 1 tablet (650 mg total) by mouth 2 (two) times daily. 09/28/14   Rushil Terrilee Croak, MD   BP 143/66 mmHg  Pulse 74  Temp(Src) 98.1 F (36.7 C)  Resp 14  SpO2 99% Physical Exam  Constitutional: She is oriented to person, place, and time.  She appears well-developed and well-nourished. No distress.  HENT:  Head: Normocephalic and atraumatic.  Mouth/Throat: Oropharynx is clear and moist.  Eyes: EOM are normal. Pupils are equal, round, and reactive to light.  Neck: Normal range of motion. Neck supple.  Cardiovascular: Normal rate and regular rhythm.  Exam reveals no friction rub.   No murmur heard. Pulmonary/Chest: Effort normal and breath sounds normal. No respiratory distress. She has no wheezes. She has no rales.  Abdominal: Soft. She exhibits no distension. There is no tenderness. There is no rebound.  Musculoskeletal: Normal range of motion. She exhibits no edema.  Neurological: She is alert and oriented to person, place, and time.  Skin: No rash noted. She is not diaphoretic.  Nursing note and vitals reviewed.   ED Course  Procedures (including critical care time) Labs Review Labs Reviewed  BASIC METABOLIC PANEL - Abnormal; Notable for the following:    CO2 17 (*)    Glucose, Bld 167 (*)    BUN 44 (*)    Creatinine, Ser 3.45 (*)    Calcium 8.8 (*)    GFR calc non Af Amer 12 (*)    GFR calc Af Amer 13 (*)    All other components within normal limits  CBC  I-STAT TROPOININ, ED    Imaging Review Dg Chest 2 View  12/07/2014   CLINICAL DATA:  Chest pain, shortness of breath and fatigue beginning today worse this evening, history hypertension, coronary artery disease, COPD, CHF  EXAM: CHEST  2 VIEW   COMPARISON:  09/25/2014  FINDINGS: Enlargement of cardiac silhouette post CABG with slight vascular congestion.  Atherosclerotic calcification aorta.  Mediastinal contours otherwise normal.  Prominent RIGHT perihilar markings unchanged.  Peribronchial thickening without infiltrate pleural effusion.  Scattered areas of interstitial prominence in both lungs which could represent fibrosis or minimal chronic edema.  No segmental consolidation, pleural effusion or pneumothorax.  Bones demineralized.  IMPRESSION: Enlargement of cardiac silhouette with vascular congestion post CABG.  Chronic interstitial prominence which may reflect minimal chronic edema or fibrosis.  No definite acute infiltrate.   Electronically Signed   By: Ulyses Southward M.D.   On: 12/07/2014 19:42     EKG Interpretation   Date/Time:  Saturday December 07 2014 18:14:58 EDT Ventricular Rate:  78 PR Interval:  196 QRS Duration: 84 QT Interval:  398 QTC Calculation: 453 R Axis:   93 Text Interpretation:  Sinus rhythm with Premature atrial complexes  Rightward axis Cannot rule out Inferior infarct , age undetermined  Abnormal ECG No significant change since last tracing Confirmed by Gwendolyn Grant   MD, Kimm Sider [phone]) on 12/07/2014 6:34:35 PM      MDM   Final diagnoses:  Chest pain, unspecified chest pain type    19F here with chest tightness and weakness. Began earlier today. Intermittent. Does have some mild chest tightness here.  AFVSS. Well appearing. Labs show CKD. BNP elevated, with her degree of renal failure, likely chronically elevated. Internal medicine admitting.    Elwin Mocha, MD 12/07/14 2106

## 2014-12-07 NOTE — H&P (Signed)
Date: 12/07/2014               Patient Name:  Bianca [name] MRN: [id number]  DOB: 02/24/1931 Age / Sex: 81 y.o., female   PCP: Su Hoff, MD         Medical Service: Internal Medicine Teaching Service         Attending Physician: Dr. Burns Spain, MD    First Contact: Dr. Allena Katz Pager: [phone]  Second Contact: Dr. Mikey Bussing Pager: [phone]       After Hours (After 5p/  First Contact Pager: [phone]  weekends / holidays): Second Contact Pager: [phone]   Chief Complaint: SOB  History of Present Illness: Ms. Kolasinski is an 81 yo female with CAD s/p CABG, combined CHF (EF 55% in May 2016), CKDV, DMII, COPD, pAF, chronic HBV, HTN, and HLD.  The history is obtained through the daughter, as the patient only speaks [place].  The patient presents with a 1 day h/o of cough, CP, SOB, and wheezing, starting last night.  She states her cough is nonproductive and worse at night, when lying down to sleep.  She has a h/o GERD for which endorses intermittent use of her PPI.  She describes her chest pain as sternal "chest tightness,"nonradiating, and relieved with the morphine provided in the ED.  She does not know whether she received Nitro.  She denies positional changes or pleuritic chest pain.  Her SOB worsened today with increased "wheezing" reported by family.  The patient says her breathing is worse when sitting straight up, and relieved with lying flat.  Her SOB and chest pain were not relieved with her albuterol inhaler this afternoon.    She endorses a sore throat associated with her cough and some nausea.  She denies recent fever, chills, runny nose, sinus congestion, sick contacts, vomiting, constipation, diarrhea, or dysuria.  She has a h/o combined CHF, with last echo in May 2016, showing EF 55%.  She endorses compliance with her diet and medications.  She has not taken her PRN Lasix for approximately a month ago.  She denies recent weight changes, though her appetite has been decreased  over the last couple days.  She has had admissions in February, April, and May for SOB, thought to be due to a combination of her CHF and CKD.  In the ED, her initial troponin was negative.  Her BNP was elevated at 992, and her CXR shows vascular congestion with minimal chronic edema.  Clinic note from her nephrologist in March, recommending scheduled use of her Lasix.  The patient has a DNR form completed and signed by her PCP.  Meds: Current Facility-Administered Medications  Medication Dose Route Frequency Provider Last Rate Last Dose  . morphine 2 MG/ML injection 2 mg  2 mg Intravenous Q2H PRN Marrian Salvage, MD      . nitroGLYCERIN (NITROSTAT) SL tablet 0.4 mg  0.4 mg Sublingual Q5 min PRN Elwin Mocha, MD      . nitroGLYCERIN (NITROSTAT) SL tablet 0.4 mg  0.4 mg Sublingual Q5 min PRN Elwin Mocha, MD       Current Outpatient Prescriptions  Medication Sig Dispense Refill  . acetaminophen (TYLENOL) 500 MG tablet Take 2 tablets (1,000 mg total) by mouth every 8 (eight) hours as needed for mild pain. 100 tablet 2  . albuterol (PROAIR HFA) 108 (90 BASE) MCG/ACT inhaler INHALE 1 PUFF EVERY 6 HOURS AS NEEDED FOR WHEEZE 8.5 each 2  . amLODipine (NORVASC) 10 MG  tablet Take 1 tablet (10 mg total) by mouth daily. 90 tablet 1  . aspirin EC 81 MG tablet TAKE 1 TABLET BY MOUTH EVERY DAY 150 tablet 1  . B-D ULTRAFINE III SHORT PEN 31G X 8 MM MISC USE AS DIRECTED TO INJECT INSULIN 3 TIMES A DAY 100 each 6  . BAYER MICROLET LANCETS lancets Use as instructed 200 each 5  . benzonatate (TESSALON PERLES) 100 MG capsule Take 1 capsule (100 mg total) by mouth 3 (three) times daily as needed for cough. 30 capsule 0  . carvedilol (COREG) 25 MG tablet TAKE 1 TABLET (25 MG TOTAL) BY MOUTH 2 (TWO) TIMES DAILY WITH A MEAL. 120 tablet 3  . cholecalciferol (VITAMIN D) 1000 UNITS tablet Take 2 tablets (2,000 Units total) by mouth daily. 60 tablet 3  . CRESTOR 20 MG tablet TAKE 1 TABLET (20 MG TOTAL) BY MOUTH DAILY. 90  tablet 3  . fluticasone (FLOVENT HFA) 44 MCG/ACT inhaler Inhale 1 puff into the lungs 2 (two) times daily. 1 Inhaler 12  . furosemide (LASIX) 40 MG tablet Take 1 tablet (40 mg total) by mouth as needed (leg swelling, shortness of breath). 30 tablet 2  . gabapentin (NEURONTIN) 100 MG capsule Take 1 capsule (100 mg total) by mouth daily. 30 capsule 2  . glucose blood (BAYER CONTOUR NEXT TEST) test strip 1 each by Other route as needed for other. Use 4 to 5 times daily to check blood sugar. diag code E11.9. Insulin dependent 200 each 5  . hydrALAZINE (APRESOLINE) 25 MG tablet Take 1 tablet (25 mg total) by mouth 3 (three) times daily. 270 tablet 1  . insulin aspart (NOVOLOG FLEXPEN) 100 UNIT/ML FlexPen Please take 3 units with breakfast, 3 units with lunch, and 3 units with dinner/supper 100 mL 9  . insulin glargine (LANTUS) 100 UNIT/ML injection Inject 0.05 mLs (5 Units total) into the skin at bedtime. 10 mL 5  . isosorbide mononitrate (IMDUR) 120 MG 24 hr tablet Take 120 mg by mouth daily.  1  . nitroGLYCERIN (NITROSTAT) 0.4 MG SL tablet Place 0.4 mg under the tongue every 5 (five) minutes as needed. For chest pain. Maximum of 3 doses    . omeprazole (PRILOSEC) 20 MG capsule TAKE 1 CAPSULE (20 MG TOTAL) BY MOUTH DAILY. 30 capsule 0  . simethicone (GAS-X) 80 MG chewable tablet Chew 1 tablet (80 mg total) by mouth every 6 (six) hours as needed for flatulence. 30 tablet 0  . sodium bicarbonate 650 MG tablet Take 1 tablet (650 mg total) by mouth 2 (two) times daily. 30 tablet 0    Allergies: Allergies as of 12/07/2014 - Review Complete 12/07/2014  Allergen Reaction Noted  . Cozaar Other (See Comments) 02/12/2011  . Lisinopril  05/24/2012   Past Medical History  Diagnosis Date  . GERD (gastroesophageal reflux disease)   . Hepatitis B   . Hyperlipemia   . Hypertension   . Cervical dysplasia   . Atrophic vaginitis   . Spinal stenosis     s/p decompression  . CAD (coronary artery disease) April  2008    s/p CABG;    cath 8/12:  LM patent, pLAD occluded, OM1 30-40%, pOM2 80% and 60% after the anastomosis, pRCA occluded, S-DX occluded (old), S-OM 2 occluded (new), L-LAD patent, dLAD provided collats to the PDA; Med Rx  rec.; consider PCI to OM2 if fails med Rx  . Hx of hysterectomy     daughter, Rocky Crafts, denies this hx on 08/23/2014  .  UTI (lower urinary tract infection)   . Diverticulosis of intestine 04/29/2014    Of ascending colon noted on CT abd/ pelvis 04/20/14  . Diabetes mellitus, type 2     insulin dependent  . COPD (chronic obstructive pulmonary disease)     Hattie Perch 08/23/2014  . Chronic kidney disease (CKD), stage IV (severe)     Hattie Perch 08/23/2014  . CHF (congestive heart failure)     Hattie Perch 08/23/2014  . Chronic lower back pain   . Shortness of breath dyspnea   . UTI (urinary tract infection) 09/2014   Past Surgical History  Procedure Laterality Date  . Esophagogastroduodenoscopy Left 04/22/2014    Procedure: ESOPHAGOGASTRODUODENOSCOPY (EGD);  Surgeon: Shirley Friar, MD;  Location: [facility] ENDOSCOPY;  Service: Endoscopy;  Laterality: Left;  . Total abdominal hysterectomy      cervical dysplasia; daughter, Rocky Crafts denies this hx on 08/23/2014  . Back surgery    . Lumbar laminectomy/decompression microdiscectomy  02/2000    Central and lateral decompression L3-4, L4-5, L5-S1./notes 09/15/2010  . Coronary artery bypass graft  April 2008    "CABG X5"  . Cardiac catheterization  08/2006; 09/2006; 2011,  12/2010    Hattie Perch 5/15/2012Marland Kitchen Hattie Perch 5/2/2012Marland Kitchen Hattie Perch 09/14/2010   History reviewed. No pertinent family history. History   Social History  . Marital Status: Widowed    Spouse Name: N/A  . Number of Children: N/A  . Years of Education: N/A   Occupational History  . Not on file.   Social History Main Topics  . Smoking status: Never Smoker   . Smokeless tobacco: Never Used  . Alcohol Use: No  . Drug Use: No  . Sexual Activity: Not on file   Other Topics Concern  . Not on  file   Social History Narrative   [place] immigrant   Housewife   Widowed   Lives w/ her son    Review of Systems: Pertinent items are noted in HPI.  Physical Exam: Blood pressure 175/73, pulse 84, temperature 98.1 F (36.7 C), resp. rate 24, weight 108 lb 1.6 oz (49.034 kg), SpO2 100 %. Physical Exam  Constitutional: She is oriented to person, place, and time and well-developed, well-nourished, and in no distress.  HENT:  Head: Normocephalic and atraumatic.  Eyes: EOM are normal.  Neck: Normal range of motion. No tracheal deviation present.  JVD at clavicle with head of bed at 45 degrees.  Cardiovascular: Normal rate, regular rhythm, normal heart sounds and intact distal pulses.   Pulmonary/Chest: Effort normal. No respiratory distress.  Minimal crackles at left lung base.  No wheezes.  Upper airway sounds present.  Abdominal: Soft. She exhibits no distension. There is no tenderness. There is no rebound and no guarding.  Musculoskeletal: Normal range of motion.  Trace edema to knee bilaterally  Neurological: She is alert and oriented to person, place, and time.  Skin: Skin is warm and dry.     Lab results: Basic Metabolic Panel:  Recent Labs  69/62/95 1730  NA 136  K 5.0  CL 107  CO2 17*  GLUCOSE 167*  BUN 44*  CREATININE 3.45*  CALCIUM 8.8*   Liver Function Tests: No results for input(s): AST, ALT, ALKPHOS, BILITOT, PROT, ALBUMIN in the last 72 hours. No results for input(s): LIPASE, AMYLASE in the last 72 hours. No results for input(s): AMMONIA in the last 72 hours. CBC:  Recent Labs  12/07/14 1730  WBC 6.3  HGB 8.7*  HCT 26.8*  MCV 93.1  PLT 115*  Cardiac Enzymes: No results for input(s): CKTOTAL, CKMB, CKMBINDEX, TROPONINI in the last 72 hours.  BNP: 992.2  D-Dimer: No results for input(s): DDIMER in the last 72 hours. CBG: No results for input(s): GLUCAP in the last 72 hours. Hemoglobin A1C: No results for input(s): HGBA1C in the last 72  hours. Fasting Lipid Panel: No results for input(s): CHOL, HDL, LDLCALC, TRIG, CHOLHDL, LDLDIRECT in the last 72 hours. Thyroid Function Tests: No results for input(s): TSH, T4TOTAL, FREET4, T3FREE, THYROIDAB in the last 72 hours. Anemia Panel: No results for input(s): VITAMINB12, FOLATE, FERRITIN, TIBC, IRON, RETICCTPCT in the last 72 hours. Coagulation: No results for input(s): LABPROT, INR in the last 72 hours. Urine Drug Screen: Drugs of Abuse    Alcohol Level: No results for input(s): ETH in the last 72 hours. Urinalysis: No results for input(s): COLORURINE, LABSPEC, PHURINE, GLUCOSEU, HGBUR, BILIRUBINUR, KETONESUR, PROTEINUR, UROBILINOGEN, NITRITE, LEUKOCYTESUR in the last 72 hours.  Invalid input(s): APPERANCEUR   Imaging results:  Dg Chest 2 View  12/07/2014   CLINICAL DATA:  Chest pain, shortness of breath and fatigue beginning today worse this evening, history hypertension, coronary artery disease, COPD, CHF  EXAM: CHEST  2 VIEW  COMPARISON:  09/25/2014  FINDINGS: Enlargement of cardiac silhouette post CABG with slight vascular congestion.  Atherosclerotic calcification aorta.  Mediastinal contours otherwise normal.  Prominent RIGHT perihilar markings unchanged.  Peribronchial thickening without infiltrate pleural effusion.  Scattered areas of interstitial prominence in both lungs which could represent fibrosis or minimal chronic edema.  No segmental consolidation, pleural effusion or pneumothorax.  Bones demineralized.  IMPRESSION: Enlargement of cardiac silhouette with vascular congestion post CABG.  Chronic interstitial prominence which may reflect minimal chronic edema or fibrosis.  No definite acute infiltrate.   Electronically Signed   By: Ulyses Southward M.D.   On: 12/07/2014 19:42    Other results: EKG: normal sinus rhythm, PAC's noted.  Assessment & Plan by Problem: Principal Problem:   Chest pain Active Problems:   GERD   Chronic renal failure, stage 5   Type II  diabetes mellitus with stage 5 chronic kidney disease   Cardiomyopathy, ischemic   Chronic combined systolic and diastolic CHF (congestive heart failure)   COPD (chronic obstructive pulmonary disease)   Coronary artery disease involving autologous vein coronary bypass graft with other forms of angina pectoris   Constipation   Paroxysmal atrial fibrillation   Dyspnea  Ms. Marando is an 81 yo female with CAD s/p CABG, combined CHF (EF 55% in May 2016), CKDV, DMII, COPD, pAF, chronic HBV, HTN, and HLD, presenting with 1 day h/o CP and SOB.  SOB/Cough/CP: Etiology currently unclear, CHF exacerbation vs uncontrolled COPD.  Patient has h/o admission for CHF exacerbations, elevated BNP (though she has CKD), slightly increased weight, CXR with vascular congestion, and is not taking daily Lasix.  However, her EF is 55%, her symptoms are improved with lying flat and she is satting almost 98% on RA.  She is nonadherent to her COPD maintenance medication, but denies fever and chills, and no consolidation on CXR. It is possible patient's symptoms due to GERD, and worsening cough when lying flat is provoking sensation of SOB and concern on behalf of family.  For example, family is worried about her "wheezing," but patient exhibits upper airway sounds without wheezing.  HEART score 4. Wells PE score 0.  Will continue ACS r/o and cardiology consult. - Albuterol neb - Trend troponin - Cardiology consult, appreciate rec's - Strict I/Os - Daily  weights - Lasix 80 mg IV once - Protonix - Teley - Nitro/Morphine  Combined CHF: Last echo May 2016 with EF 55%.  Patient not obviously volume up, but will see how she responds to IV Lasix.  Cardiology consulted. - ASA - Hydralazine 25 mg TID - Coreg 25 mg BID - Norvasc 10 mg - Imdur 120 daily - Crestor 20 mg - Lasix 80 mg IV once  CKD5: Cr elevated to 3.45.  Patient has unclear baseline, so it is possible she also has an AKI at this time. Given concern for CHF  exacerbation, will see if Cr decreases with diuresis. - Lasix 80 mg IV once  COPD: Patient's symptoms concerning for uncontrolled COPD more than acute exacerbation.  Will continue home meds with PRN nebs.  Patient will need education regarding scheduled daily use of Flovent, regardless of symptoms. - Albuterol/Duonebs PRN - Flovent 1 puff BID  DMII: Last A1c 6.9 in May 2016. Continue home regimen - Lantus 6 units qHS - Novolog 6 units qAC - SSI  CAD s/p CABG: Low concern for ACS at this time, though will continue rule-out as above. GERD: Protonix  FEN/GI   - Carb modified  DVT Ppx: Heparin  Dispo: Disposition is deferred at this time, awaiting improvement of current medical problems.   The patient does have a current PCP (Carly Arlyce Harman, MD) and does need an [facility] hospital follow-up appointment after discharge.  The patient does not have transportation limitations that hinder transportation to clinic appointments.  Signed: Jana Half, MD, PhD 12/07/2014, 11:07 PM

## 2014-12-07 NOTE — ED Notes (Signed)
Patient transported to X-ray 

## 2014-12-07 NOTE — ED Notes (Signed)
Pt here for chest pain , SOB and fatigue that started today and worsening.

## 2014-12-08 ENCOUNTER — Encounter (HOSPITAL_COMMUNITY): Payer: Self-pay | Admitting: *Deleted

## 2014-12-08 DIAGNOSIS — I5043 Acute on chronic combined systolic (congestive) and diastolic (congestive) heart failure: Secondary | ICD-10-CM

## 2014-12-08 DIAGNOSIS — N185 Chronic kidney disease, stage 5: Secondary | ICD-10-CM | POA: Diagnosis not present

## 2014-12-08 DIAGNOSIS — I120 Hypertensive chronic kidney disease with stage 5 chronic kidney disease or end stage renal disease: Secondary | ICD-10-CM | POA: Diagnosis not present

## 2014-12-08 DIAGNOSIS — E1122 Type 2 diabetes mellitus with diabetic chronic kidney disease: Secondary | ICD-10-CM | POA: Diagnosis not present

## 2014-12-08 LAB — GLUCOSE, CAPILLARY
GLUCOSE-CAPILLARY: 152 mg/dL — AB (ref 65–99)
GLUCOSE-CAPILLARY: 76 mg/dL (ref 65–99)
Glucose-Capillary: 155 mg/dL — ABNORMAL HIGH (ref 65–99)

## 2014-12-08 LAB — RENAL FUNCTION PANEL
ALBUMIN: 3.1 g/dL — AB (ref 3.5–5.0)
Anion gap: 9 (ref 5–15)
BUN: 45 mg/dL — AB (ref 6–20)
CALCIUM: 8.5 mg/dL — AB (ref 8.9–10.3)
CO2: 20 mmol/L — ABNORMAL LOW (ref 22–32)
Chloride: 110 mmol/L (ref 101–111)
Creatinine, Ser: 3.51 mg/dL — ABNORMAL HIGH (ref 0.44–1.00)
GFR calc Af Amer: 13 mL/min — ABNORMAL LOW (ref >60)
GFR, EST NON AFRICAN AMERICAN: 11 mL/min — AB (ref >60)
Glucose, Bld: 102 mg/dL — ABNORMAL HIGH (ref 65–99)
Phosphorus: 4.7 mg/dL — ABNORMAL HIGH (ref 2.5–4.6)
Potassium: 4.4 mmol/L (ref 3.5–5.1)
Sodium: 139 mmol/L (ref 135–145)

## 2014-12-08 LAB — TROPONIN I
Troponin I: <0.03 ng/mL (ref <0.031)
Troponin I: <0.03 ng/mL (ref <0.031)
Troponin I: <0.03 ng/mL (ref <0.031)

## 2014-12-08 MED ORDER — CETYLPYRIDINIUM CHLORIDE 0.05 % MT LIQD: 7.0000 mL | Freq: Two times a day (BID) | OROMUCOSAL | Status: DC

## 2014-12-08 MED ORDER — FUROSEMIDE 40 MG PO TABS: 40.0000 mg | ORAL_TABLET | Freq: Every day | ORAL | Status: DC

## 2014-12-08 MED ORDER — PNEUMOCOCCAL VAC POLYVALENT 25 MCG/0.5ML IJ INJ: 0.5000 mL | INJECTION | INTRAMUSCULAR | Status: DC

## 2014-12-08 MED ORDER — BUDESONIDE 0.25 MG/2ML IN SUSP
0.2500 mg | Freq: Two times a day (BID) | RESPIRATORY_TRACT | Status: DC
  Administered 2014-12-08 (×2): 0.25 mg via RESPIRATORY_TRACT
  Filled 2014-12-08 (×4): qty 2

## 2014-12-08 NOTE — Progress Notes (Signed)
Ambulate patient in hall way, oxygen saturation at rest 100/RA, 97%/RA while ambulating. No c/o chest pain or shortness of breath. Will continue to monitor patient.

## 2014-12-08 NOTE — Progress Notes (Signed)
Subjective: Patient does not speak English, communication is done through family. She is feeling much better since last night. Denies SOB when lying or sitting. Has some dyspnea when walking short distances. No chest pain. No overnight events. Objective: Vital signs in last 24 hours: Filed Vitals:   12/08/14 0056 12/08/14 0500 12/08/14 0951 12/08/14 1115  BP:  147/68  152/64  Pulse: 75 70  66  Temp:  98.4 F (36.9 C)  98.4 F (36.9 C)  TempSrc:  Oral  Oral  Resp: 18 18  20   Height:      Weight:      SpO2: 99% 100% 97% 100%   Weight change:   Intake/Output Summary (Last 24 hours) at 12/08/14 1229 Last data filed at 12/08/14 1100  Gross per 24 hour  Intake    240 ml  Output   1300 ml  Net  -1060 ml   General: resting in bed HEENT: EOMI, no scleral icterus Cardiac: RRR, no rubs, murmurs or gallops Pulm: clear to auscultation bilaterally, moving normal volumes of air, SpO2 99% on room air Abd: soft, nontender, nondistended Ext: warm and well perfused, no pedal edema   Lab Results: Basic Metabolic Panel:  Recent Labs Lab 12/07/14 1730 12/08/14 0436  NA 136 139  K 5.0 4.4  CL 107 110  CO2 17* 20*  GLUCOSE 167* 102*  BUN 44* 45*  CREATININE 3.45* 3.51*  CALCIUM 8.8* 8.5*  PHOS  --  4.7*   Liver Function Tests:  Recent Labs Lab 12/08/14 0436  ALBUMIN 3.1*   No results for input(s): LIPASE, AMYLASE in the last 168 hours. No results for input(s): AMMONIA in the last 168 hours. CBC:  Recent Labs Lab 12/07/14 1730  WBC 6.3  HGB 8.7*  HCT 26.8*  MCV 93.1  PLT 115*   Cardiac Enzymes:  Recent Labs Lab 12/08/14 0025 12/08/14 0436  TROPONINI <0.03 <0.03   BNP: No results for input(s): PROBNP in the last 168 hours. D-Dimer: No results for input(s): DDIMER in the last 168 hours. CBG:  Recent Labs Lab 12/08/14 0005 12/08/14 0625 12/08/14 1109  GLUCAP 152* 76 155*   Hemoglobin A1C: No results for input(s): HGBA1C in the last 168  hours. Fasting Lipid Panel: No results for input(s): CHOL, HDL, LDLCALC, TRIG, CHOLHDL, LDLDIRECT in the last 168 hours. Thyroid Function Tests: No results for input(s): TSH, T4TOTAL, FREET4, T3FREE, THYROIDAB in the last 168 hours. Coagulation: No results for input(s): LABPROT, INR in the last 168 hours. Anemia Panel: No results for input(s): VITAMINB12, FOLATE, FERRITIN, TIBC, IRON, RETICCTPCT in the last 168 hours. Urine Drug Screen: Drugs of Abuse     Component Value Date/Time   LABOPIA NONE DETECTED 09/24/2014 1245   COCAINSCRNUR NONE DETECTED 09/24/2014 1245   LABBENZ NONE DETECTED 09/24/2014 1245   AMPHETMU NONE DETECTED 09/24/2014 1245   THCU NONE DETECTED 09/24/2014 1245   LABBARB NONE DETECTED 09/24/2014 1245      Micro Results: No results found for this or any previous visit (from the past 240 hour(s)). Studies/Results: Dg Chest 2 View  12/07/2014   CLINICAL DATA:  Chest pain, shortness of breath and fatigue beginning today worse this evening, history hypertension, coronary artery disease, COPD, CHF  EXAM: CHEST  2 VIEW  COMPARISON:  09/25/2014  FINDINGS: Enlargement of cardiac silhouette post CABG with slight vascular congestion.  Atherosclerotic calcification aorta.  Mediastinal contours otherwise normal.  Prominent RIGHT perihilar markings unchanged.  Peribronchial thickening without infiltrate pleural effusion.  Scattered  areas of interstitial prominence in both lungs which could represent fibrosis or minimal chronic edema.  No segmental consolidation, pleural effusion or pneumothorax.  Bones demineralized.  IMPRESSION: Enlargement of cardiac silhouette with vascular congestion post CABG.  Chronic interstitial prominence which may reflect minimal chronic edema or fibrosis.  No definite acute infiltrate.   Electronically Signed   By: Ulyses Southward M.D.   On: 12/07/2014 19:42   Medications: I have reviewed the patient's current medications. Scheduled Meds: . amLODipine  10 mg  Oral Daily  . antiseptic oral rinse  7 mL Mouth Rinse q12n4p  . aspirin EC  81 mg Oral Daily  . budesonide (PULMICORT) nebulizer solution  0.25 mg Nebulization BID  . carvedilol  25 mg Oral BID WC  . cholecalciferol  2,000 Units Oral Daily  . gabapentin  100 mg Oral Daily  . hydrALAZINE  25 mg Oral TID  . insulin aspart  0-9 Units Subcutaneous TID WC  . insulin glargine  5 Units Subcutaneous QHS  . isosorbide mononitrate  120 mg Oral Daily  . pantoprazole  40 mg Oral Daily  . [START ON 12/09/2014] pneumococcal 23 valent vaccine  0.5 mL Intramuscular Tomorrow-1000  . rosuvastatin  20 mg Oral q1800  . sodium bicarbonate  650 mg Oral BID   Continuous Infusions:  PRN Meds:.acetaminophen, albuterol, morphine injection, nitroGLYCERIN, simethicone Assessment/Plan: Principal Problem:   Chest pain Active Problems:   GERD   Chronic renal failure, stage 5   Type II diabetes mellitus with stage 5 chronic kidney disease   Cardiomyopathy, ischemic   Chronic combined systolic and diastolic CHF (congestive heart failure)   COPD (chronic obstructive pulmonary disease)   Coronary artery disease involving autologous vein coronary bypass graft with other forms of angina pectoris   Constipation   Paroxysmal atrial fibrillation   Dyspnea  COPD exacerbation w/ SOB/Cough/CP: Patient has 1 day hx SOB w/ h/o admission for CHF exacerbations. Stated she had some chest tightness as well. She takes Lasix prn, not daily. EF is 55%, satting 99% on room air. Her symptoms are improved this morning. Family state that she seems much better as well. Lungs are clear on examination, she had no dyspnea when lying or sitting up. Family states she has some dyspnea when ambulating short distances. Suspicion for ACS, was given morphine, nitro, aspirin, o2. Trops neg x 2. Symptoms seem more like a COPD exacerbation, possible CHF exacerbation. -Albuterol/Duonebs prn -Flovent 1 puff bid -Protonix -Ambulate with pulse ox  checks -asa 81 mg  Combined CHF: EF 55% in May 2016. Patient takes Lasix prn at home, not daily. She was started on IV Lasix overnight. -Aspirin -Hydralazine 25 mg tid -Coreg 25 mg BID -Norvasc 10 mg -Imdur 120 mg -Crestor 20 mg -heart healthy diet  CKD stage 5: Cr 3.51, 3.45 on admission. Baseline unclear, levels between 3-4 for the last 8 months. Had Lasix 80 mg IV once overnight -monitor  T2DM: A1C 6.9 in May 2016. -Lantus 5 Units qhs -SSI  CAD s/p CABG: Rosuvastatin 20 mg  GERD: Protonix 40 mg  DVT: heparin   Dispo: Disposition is deferred at this time, awaiting improvement of current medical problems.  Anticipated discharge in approximately 1 day(s).   The patient does have a current PCP (Carly Arlyce Harman, MD) and does need [facility] follow-up appointment after discharge.  The patient does not have transportation limitations that hinder transportation to clinic appointments.  .Services Needed at time of discharge: Y = Yes, Blank =  No PT:   OT:   RN:   Equipment:   Other:       Darreld Mclean, MD 12/08/2014, 12:29 PM

## 2014-12-08 NOTE — Progress Notes (Signed)
Patient alert oriented , does not speak english, family at bedside. Patient denies pain, no shortness of breath. PT. Is NPO, SR on the monitor, BP elevated otherwise stable. Will continue to monitor patient.

## 2014-12-08 NOTE — Discharge Summary (Signed)
Name: Bianca [name] MRN: [id number] DOB: 09-28-1930 81 y.o. PCP: Su Hoff, MD  Date of Admission: 12/07/2014  6:33 PM Date of Discharge: 12/08/2014 Attending Physician: Burns Spain, MD  Discharge Diagnosis: 1. CHF exacerbation  Principal Problem:   Chronic combined systolic and diastolic CHF (congestive heart failure) Active Problems:   GERD   Chronic renal failure, stage 5   Type II diabetes mellitus with stage 5 chronic kidney disease   Cardiomyopathy, ischemic   COPD (chronic obstructive pulmonary disease)   Coronary artery disease involving autologous vein coronary bypass graft with other forms of angina pectoris   Constipation   Paroxysmal atrial fibrillation   Dyspnea  Discharge Medications:   Medication List    TAKE these medications        acetaminophen 500 MG tablet  Commonly known as:  TYLENOL  Take 2 tablets (1,000 mg total) by mouth every 8 (eight) hours as needed for mild pain.     albuterol 108 (90 BASE) MCG/ACT inhaler  Commonly known as:  PROAIR HFA  INHALE 1 PUFF EVERY 6 HOURS AS NEEDED FOR WHEEZE     amLODipine 10 MG tablet  Commonly known as:  NORVASC  Take 1 tablet (10 mg total) by mouth daily.     aspirin EC 81 MG tablet  TAKE 1 TABLET BY MOUTH EVERY DAY     B-D ULTRAFINE III SHORT PEN 31G X 8 MM Misc  Generic drug:  Insulin Pen Needle  USE AS DIRECTED TO INJECT INSULIN 3 TIMES A DAY     BAYER MICROLET LANCETS lancets  Use as instructed     benzonatate 100 MG capsule  Commonly known as:  TESSALON PERLES  Take 1 capsule (100 mg total) by mouth 3 (three) times daily as needed for cough.     carvedilol 25 MG tablet  Commonly known as:  COREG  TAKE 1 TABLET (25 MG TOTAL) BY MOUTH 2 (TWO) TIMES DAILY WITH A MEAL.     cholecalciferol 1000 UNITS tablet  Commonly known as:  VITAMIN D  Take 2 tablets (2,000 Units total) by mouth daily.     CRESTOR 20 MG tablet  Generic drug:  rosuvastatin  TAKE 1 TABLET (20 MG TOTAL) BY MOUTH  DAILY.     fluticasone 44 MCG/ACT inhaler  Commonly known as:  FLOVENT HFA  Inhale 1 puff into the lungs 2 (two) times daily.     furosemide 40 MG tablet  Commonly known as:  LASIX  Take 1 tablet (40 mg total) by mouth as needed (leg swelling, shortness of breath).     furosemide 40 MG tablet  Commonly known as:  LASIX  Take 1 tablet (40 mg total) by mouth daily.     gabapentin 100 MG capsule  Commonly known as:  NEURONTIN  Take 1 capsule (100 mg total) by mouth daily.     glucose blood test strip  Commonly known as:  BAYER CONTOUR NEXT TEST  1 each by Other route as needed for other. Use 4 to 5 times daily to check blood sugar. diag code E11.9. Insulin dependent     hydrALAZINE 25 MG tablet  Commonly known as:  APRESOLINE  Take 1 tablet (25 mg total) by mouth 3 (three) times daily.     insulin aspart 100 UNIT/ML FlexPen  Commonly known as:  NOVOLOG FLEXPEN  Please take 3 units with breakfast, 3 units with lunch, and 3 units with dinner/supper     insulin  glargine 100 UNIT/ML injection  Commonly known as:  LANTUS  Inject 0.05 mLs (5 Units total) into the skin at bedtime.     isosorbide mononitrate 120 MG 24 hr tablet  Commonly known as:  IMDUR  Take 120 mg by mouth daily.     nitroGLYCERIN 0.4 MG SL tablet  Commonly known as:  NITROSTAT  Place 0.4 mg under the tongue every 5 (five) minutes as needed. For chest pain. Maximum of 3 doses     omeprazole 20 MG capsule  Commonly known as:  PRILOSEC  TAKE 1 CAPSULE (20 MG TOTAL) BY MOUTH DAILY.     simethicone 80 MG chewable tablet  Commonly known as:  GAS-X  Chew 1 tablet (80 mg total) by mouth every 6 (six) hours as needed for flatulence.     sodium bicarbonate 650 MG tablet  Take 1 tablet (650 mg total) by mouth 2 (two) times daily.        Disposition and follow-up:   Bianca [name] was discharged from [facility] in Good condition.  At the hospital follow up visit please address:  1.  CHF: We  are starting Bianca [name] on Lasix 40 mg daily. She seems to have responded to lasix well in hospital, but will need to be observed to assess if she is losing too much fluid on this medication. Started on 40 mg due to concern she won't respond to lower dose because of her CKD5. May consider every other day dosing.  COPD: Patient had wheezing on admission, but improved quickly with albuterol/lasix treatment. Please follow up on her symptoms and medication adherence.  2.  Labs / imaging needed at time of follow-up: none  3.  Pending labs/ test needing follow-up: none  Follow-up Appointments: Follow-up Information    Follow up with Rich Number, MD. Schedule an appointment as soon as possible for a visit in 1 week.   Specialty:  Internal Medicine   Contact information:   [address] [place] [address] [phone]       Discharge Instructions: Discharge Instructions    (HEART FAILURE PATIENTS) Call MD:  Anytime you have any of the following symptoms: 1) 3 pound weight gain in 24 hours or 5 pounds in 1 week 2) shortness of breath, with or without a dry hacking cough 3) swelling in the hands, feet or stomach 4) if you have to sleep on extra pillows at night in order to breathe.    Complete by:  As directed      Call MD for:  severe uncontrolled pain    Complete by:  As directed      Call MD for:  temperature >100.4    Complete by:  As directed      Diet - low sodium heart healthy    Complete by:  As directed      Increase activity slowly    Complete by:  As directed          We are discharging you and you are to continue your home medications.  In addition, we are going to start you on Furosemide (Lasix) 40 mg once a day. Please take this everyday, it will help prevent fluid build up.  Please make an appointment with your primary care physician Dr. Rich Number in the next week for a follow up visit.  Consultations:  none  Procedures Performed:  Dg Chest 2 View  12/07/2014    CLINICAL DATA:  Chest pain, shortness  of breath and fatigue beginning today worse this evening, history hypertension, coronary artery disease, COPD, CHF  EXAM: CHEST  2 VIEW  COMPARISON:  09/25/2014  FINDINGS: Enlargement of cardiac silhouette post CABG with slight vascular congestion.  Atherosclerotic calcification aorta.  Mediastinal contours otherwise normal.  Prominent RIGHT perihilar markings unchanged.  Peribronchial thickening without infiltrate pleural effusion.  Scattered areas of interstitial prominence in both lungs which could represent fibrosis or minimal chronic edema.  No segmental consolidation, pleural effusion or pneumothorax.  Bones demineralized.  IMPRESSION: Enlargement of cardiac silhouette with vascular congestion post CABG.  Chronic interstitial prominence which may reflect minimal chronic edema or fibrosis.  No definite acute infiltrate.   Electronically Signed   By: Ulyses Southward M.D.   On: 12/07/2014 19:42    2D Echo: May 2016 - EF 55%, severe hypokinesis mid/apical inferior segments of LV.   Admission HPI: Bianca [name] is an 81 yo female with CAD s/p CABG, combined CHF (EF 55% in May 2016), CKDV, DMII, COPD, pAF, chronic HBV, HTN, and HLD. The history is obtained through the daughter, as the patient only speaks [place]. The patient presents with a 1 day h/o of cough, CP, SOB, and wheezing, starting last night. She states her cough is nonproductive and worse at night, when lying down to sleep. She has a h/o GERD for which endorses intermittent use of her PPI. She describes her chest pain as sternal "chest tightness,"nonradiating, and relieved with the morphine provided in the ED. She does not know whether she received Nitro. She denies positional changes or pleuritic chest pain. Her SOB worsened today with increased "wheezing" reported by family. The patient says her breathing is worse when sitting straight up, and relieved with lying flat. Her SOB and chest pain were not  relieved with her albuterol inhaler this afternoon.   She endorses a sore throat associated with her cough and some nausea. She denies recent fever, chills, runny nose, sinus congestion, sick contacts, vomiting, constipation, diarrhea, or dysuria.  She has a h/o combined CHF, with last echo in May 2016, showing EF 55%. She endorses compliance with her diet and medications. She has not taken her PRN Lasix for approximately a month ago. She denies recent weight changes, though her appetite has been decreased over the last couple days. She has had admissions in February, April, and May for SOB, thought to be due to a combination of her CHF and CKD. In the ED, her initial troponin was negative. Her BNP was elevated at 992, and her CXR shows vascular congestion with minimal chronic edema. Clinic note from her nephrologist in March, recommending scheduled use of her Lasix.  The patient has a DNR form completed and signed by her PCP.  Hospital Course by problem list: Principal Problem:   Chronic combined systolic and diastolic CHF (congestive heart failure) Active Problems:   GERD   Chronic renal failure, stage 5   Type II diabetes mellitus with stage 5 chronic kidney disease   Cardiomyopathy, ischemic   COPD (chronic obstructive pulmonary disease)   Coronary artery disease involving autologous vein coronary bypass graft with other forms of angina pectoris   Constipation   Paroxysmal atrial fibrillation   Dyspnea   Bianca [name] is an 81 yo female with CAD s/p CABG, combined CHF (EF 55% in May 2016), CKDV, DMII, COPD, pAF, chronic HBV, HTN, and HLD, presenting with 1 day h/o CP and SOB.  1. COPD exacerbation w/ SOB/cough/chest pain: Patient admitted with 1 day hx  SOB, cough, and chest tightness. There was concern for ACS and she was given nitro, morphine, aspirin, and O2. ACS was ruled out w/ negative trops and non-concerning EKG. CXR showed vascular congestion, but no consolidation. Symptoms  were possibly due to GERD as her cough was worse when laying flat. On admission, she had noticeable wheezing on examinationShe was continued on home medications, which there is question if she has been compliant. In addition to albuterol, she was given Lasix 80 mg IV once, which she has not taken in a month. Her symptoms improved overnight, no wheezing on exam, and she was ambulating on room air with good oxygenation.  2. Chronic combined systolic and diastolic CHF: Patient was no obviously volume overloaded, though there is history of 3 lb weight gain. Her BNP was elevated at 992 and CXR showed vascular congestion w/ minimal chronic edema. Given Lasix IV 80 mg once. Symptoms improved quickly and she was feeling improved next morning.  3. CKD stage 5: Cr 3.51, 3.45 on admission. Baseline unclear, levels between 3-4 for the last 8 months. No change in treatment.  4. T2DM: Lantus 5 units qhs, SSI  5. CAD s/p CABG: Crestor 20 mg  6. GERD: Protonix 40 mg  Discharge Vitals:   BP 152/64 mmHg  Pulse 66  Temp(Src) 98.4 F (36.9 C) (Oral)  Resp 20  Ht  (1.549 m)  Wt 107 lb 2.3 oz (48.6 kg)  BMI 20.26 kg/m2  SpO2 100%  Discharge Labs:  Results for orders placed or performed during the hospital encounter of 12/07/14 (from the past 24 hour(s))  Basic metabolic panel     Status: Abnormal   Collection Time: 12/07/14  5:30 PM  Result Value Ref Range   Sodium 136 135 - 145 mmol/L   Potassium 5.0 3.5 - 5.1 mmol/L   Chloride 107 101 - 111 mmol/L   CO2 17 (L) 22 - 32 mmol/L   Glucose, Bld 167 (H) 65 - 99 mg/dL   BUN 44 (H) 6 - 20 mg/dL   Creatinine, Ser 9.56 (H) 0.44 - 1.00 mg/dL   Calcium 8.8 (L) 8.9 - 10.3 mg/dL   GFR calc non Af Amer 12 (L) >60 mL/min   GFR calc Af Amer 13 (L) >60 mL/min   Anion gap 12 5 - 15  CBC     Status: Abnormal   Collection Time: 12/07/14  5:30 PM  Result Value Ref Range   WBC 6.3 4.0 - 10.5 K/uL   RBC 2.88 (L) 3.87 - 5.11 MIL/uL   Hemoglobin 8.7 (L) 12.0 -  15.0 g/dL   HCT 21.3 (L) 08.6 - 57.8 %   MCV 93.1 78.0 - 100.0 fL   MCH 30.2 26.0 - 34.0 pg   MCHC 32.5 30.0 - 36.0 g/dL   RDW 46.9 62.9 - 52.8 %   Platelets 115 (L) 150 - 400 K/uL  Brain natriuretic peptide     Status: Abnormal   Collection Time: 12/07/14  5:30 PM  Result Value Ref Range   B Natriuretic Peptide 992.2 (H) 0.0 - 100.0 pg/mL  I-stat troponin, ED     Status: None   Collection Time: 12/07/14  6:51 PM  Result Value Ref Range   Troponin i, poc 0.01 0.00 - 0.08 ng/mL   Comment 3          Glucose, capillary     Status: Abnormal   Collection Time: 12/08/14 12:05 AM  Result Value Ref Range   Glucose-Capillary 152 (H)  65 - 99 mg/dL  Troponin I-serum (0, 3, 6 hours)     Status: None   Collection Time: 12/08/14 12:25 AM  Result Value Ref Range   Troponin I <0.03 <0.031 ng/mL  Troponin I-serum (0, 3, 6 hours)     Status: None   Collection Time: 12/08/14  4:36 AM  Result Value Ref Range   Troponin I <0.03 <0.031 ng/mL  Renal function panel     Status: Abnormal   Collection Time: 12/08/14  4:36 AM  Result Value Ref Range   Sodium 139 135 - 145 mmol/L   Potassium 4.4 3.5 - 5.1 mmol/L   Chloride 110 101 - 111 mmol/L   CO2 20 (L) 22 - 32 mmol/L   Glucose, Bld 102 (H) 65 - 99 mg/dL   BUN 45 (H) 6 - 20 mg/dL   Creatinine, Ser 1.61 (H) 0.44 - 1.00 mg/dL   Calcium 8.5 (L) 8.9 - 10.3 mg/dL   Phosphorus 4.7 (H) 2.5 - 4.6 mg/dL   Albumin 3.1 (L) 3.5 - 5.0 g/dL   GFR calc non Af Amer 11 (L) >60 mL/min   GFR calc Af Amer 13 (L) >60 mL/min   Anion gap 9 5 - 15  Glucose, capillary     Status: None   Collection Time: 12/08/14  6:25 AM  Result Value Ref Range   Glucose-Capillary 76 65 - 99 mg/dL  Glucose, capillary     Status: Abnormal   Collection Time: 12/08/14 11:09 AM  Result Value Ref Range   Glucose-Capillary 155 (H) 65 - 99 mg/dL   Comment 1 Notify RN    Comment 2 Document in Chart   Troponin I-serum (0, 3, 6 hours)     Status: None   Collection Time: 12/08/14 12:30 PM    Result Value Ref Range   Troponin I <0.03 <0.031 ng/mL    Signed: Darreld Mclean, MD 12/08/2014, 2:32 PM    Services Ordered on Discharge: none Equipment Ordered on Discharge: none

## 2014-12-08 NOTE — Discharge Instructions (Signed)
We are discharging you and you are to continue your home medications.  In addition, we are going to start you on Furosemide (Lasix) 40 mg once a day. Please take this everyday, it will help prevent fluid build up.  Please make an appointment with your primary care physician Dr. Rich Number in the next week for a follow up visit.

## 2014-12-10 ENCOUNTER — Encounter (HOSPITAL_COMMUNITY): Payer: Self-pay | Admitting: *Deleted

## 2014-12-10 ENCOUNTER — Emergency Department (HOSPITAL_COMMUNITY): Payer: Medicare Other

## 2014-12-10 DIAGNOSIS — N289 Disorder of kidney and ureter, unspecified: Secondary | ICD-10-CM | POA: Insufficient documentation

## 2014-12-10 DIAGNOSIS — D696 Thrombocytopenia, unspecified: Secondary | ICD-10-CM | POA: Diagnosis not present

## 2014-12-10 DIAGNOSIS — Z87440 Personal history of urinary (tract) infections: Secondary | ICD-10-CM | POA: Diagnosis not present

## 2014-12-10 DIAGNOSIS — I2510 Atherosclerotic heart disease of native coronary artery without angina pectoris: Secondary | ICD-10-CM | POA: Insufficient documentation

## 2014-12-10 DIAGNOSIS — Z9889 Other specified postprocedural states: Secondary | ICD-10-CM | POA: Diagnosis not present

## 2014-12-10 DIAGNOSIS — R079 Chest pain, unspecified: Secondary | ICD-10-CM | POA: Insufficient documentation

## 2014-12-10 DIAGNOSIS — E872 Acidosis: Secondary | ICD-10-CM | POA: Diagnosis not present

## 2014-12-10 DIAGNOSIS — Z79899 Other long term (current) drug therapy: Secondary | ICD-10-CM | POA: Diagnosis not present

## 2014-12-10 DIAGNOSIS — E119 Type 2 diabetes mellitus without complications: Secondary | ICD-10-CM | POA: Diagnosis not present

## 2014-12-10 DIAGNOSIS — Z7982 Long term (current) use of aspirin: Secondary | ICD-10-CM | POA: Insufficient documentation

## 2014-12-10 DIAGNOSIS — R110 Nausea: Secondary | ICD-10-CM | POA: Insufficient documentation

## 2014-12-10 DIAGNOSIS — D649 Anemia, unspecified: Secondary | ICD-10-CM | POA: Diagnosis not present

## 2014-12-10 DIAGNOSIS — I509 Heart failure, unspecified: Secondary | ICD-10-CM | POA: Insufficient documentation

## 2014-12-10 DIAGNOSIS — N184 Chronic kidney disease, stage 4 (severe): Secondary | ICD-10-CM | POA: Diagnosis not present

## 2014-12-10 DIAGNOSIS — Z90710 Acquired absence of both cervix and uterus: Secondary | ICD-10-CM | POA: Diagnosis not present

## 2014-12-10 DIAGNOSIS — I129 Hypertensive chronic kidney disease with stage 1 through stage 4 chronic kidney disease, or unspecified chronic kidney disease: Secondary | ICD-10-CM | POA: Insufficient documentation

## 2014-12-10 DIAGNOSIS — K219 Gastro-esophageal reflux disease without esophagitis: Secondary | ICD-10-CM | POA: Diagnosis not present

## 2014-12-10 DIAGNOSIS — Z951 Presence of aortocoronary bypass graft: Secondary | ICD-10-CM | POA: Insufficient documentation

## 2014-12-10 DIAGNOSIS — J441 Chronic obstructive pulmonary disease with (acute) exacerbation: Secondary | ICD-10-CM | POA: Diagnosis not present

## 2014-12-10 DIAGNOSIS — Z8619 Personal history of other infectious and parasitic diseases: Secondary | ICD-10-CM | POA: Insufficient documentation

## 2014-12-10 DIAGNOSIS — E785 Hyperlipidemia, unspecified: Secondary | ICD-10-CM | POA: Diagnosis not present

## 2014-12-10 DIAGNOSIS — Z7951 Long term (current) use of inhaled steroids: Secondary | ICD-10-CM | POA: Insufficient documentation

## 2014-12-10 DIAGNOSIS — Z794 Long term (current) use of insulin: Secondary | ICD-10-CM | POA: Diagnosis not present

## 2014-12-10 DIAGNOSIS — G8929 Other chronic pain: Secondary | ICD-10-CM | POA: Diagnosis not present

## 2014-12-10 LAB — CBC WITH DIFFERENTIAL/PLATELET
Basophils Absolute: 0 10*3/uL (ref 0.0–0.1)
Basophils Relative: 0 % (ref 0–1)
Eosinophils Absolute: 0.1 10*3/uL (ref 0.0–0.7)
Eosinophils Relative: 1 % (ref 0–5)
HCT: 25.1 % — ABNORMAL LOW (ref 36.0–46.0)
Hemoglobin: 8.2 g/dL — ABNORMAL LOW (ref 12.0–15.0)
LYMPHS ABS: 0.7 10*3/uL (ref 0.7–4.0)
Lymphocytes Relative: 10 % — ABNORMAL LOW (ref 12–46)
MCH: 30.3 pg (ref 26.0–34.0)
MCHC: 32.7 g/dL (ref 30.0–36.0)
MCV: 92.6 fL (ref 78.0–100.0)
MONO ABS: 0.4 10*3/uL (ref 0.1–1.0)
Monocytes Relative: 6 % (ref 3–12)
Neutro Abs: 5.4 10*3/uL (ref 1.7–7.7)
Neutrophils Relative %: 83 % — ABNORMAL HIGH (ref 43–77)
Platelets: 103 10*3/uL — ABNORMAL LOW (ref 150–400)
RBC: 2.71 MIL/uL — ABNORMAL LOW (ref 3.87–5.11)
RDW: 13.9 % (ref 11.5–15.5)
WBC: 6.6 10*3/uL (ref 4.0–10.5)

## 2014-12-10 LAB — BASIC METABOLIC PANEL
ANION GAP: 10 (ref 5–15)
BUN: 52 mg/dL — ABNORMAL HIGH (ref 6–20)
CHLORIDE: 109 mmol/L (ref 101–111)
CO2: 20 mmol/L — ABNORMAL LOW (ref 22–32)
Calcium: 8.6 mg/dL — ABNORMAL LOW (ref 8.9–10.3)
Creatinine, Ser: 3.66 mg/dL — ABNORMAL HIGH (ref 0.44–1.00)
GFR calc Af Amer: 12 mL/min — ABNORMAL LOW (ref >60)
GFR calc non Af Amer: 11 mL/min — ABNORMAL LOW (ref >60)
GLUCOSE: 167 mg/dL — AB (ref 65–99)
POTASSIUM: 4.7 mmol/L (ref 3.5–5.1)
Sodium: 139 mmol/L (ref 135–145)

## 2014-12-10 LAB — TROPONIN I

## 2014-12-10 NOTE — ED Notes (Signed)
Patient presents with daighter stating she has been c/o chest tightness all day.

## 2014-12-11 ENCOUNTER — Emergency Department (HOSPITAL_COMMUNITY)
Admission: EM | Admit: 2014-12-11 | Discharge: 2014-12-11 | Disposition: A | Discharge status: Home / Self Care | Payer: Medicare Other | Attending: Emergency Medicine | Admitting: Emergency Medicine

## 2014-12-11 DIAGNOSIS — E872 Acidosis, unspecified: Secondary | ICD-10-CM

## 2014-12-11 DIAGNOSIS — R079 Chest pain, unspecified: Secondary | ICD-10-CM | POA: Diagnosis not present

## 2014-12-11 DIAGNOSIS — N289 Disorder of kidney and ureter, unspecified: Secondary | ICD-10-CM

## 2014-12-11 DIAGNOSIS — D649 Anemia, unspecified: Secondary | ICD-10-CM

## 2014-12-11 DIAGNOSIS — D696 Thrombocytopenia, unspecified: Secondary | ICD-10-CM

## 2014-12-11 LAB — TROPONIN I: Troponin I: <0.03 ng/mL (ref <0.031)

## 2014-12-11 NOTE — Discharge Instructions (Signed)

## 2014-12-11 NOTE — ED Notes (Signed)
Pt stable, ambulatory, states understanding of discharge instructions 

## 2014-12-11 NOTE — ED Provider Notes (Signed)
CSN: [id number]     Arrival date & time 12/10/14  2102 History  This chart was scribed for Bianca Booze, MD by Evon Slack, ED Scribe. This patient was seen in room B17C/B17C and the patient's care was started at 2:19 AM.     Chief Complaint  Patient presents with  . Chest Pain   Patient is a 81 y.o. female presenting with chest pain. The history is provided by the patient. A language interpreter was used (family member used as Equities trader).  Chest Pain Associated symptoms: nausea and shortness of breath   Associated symptoms: no diaphoresis and not vomiting    HPI Comments: Bianca [name] is a 81 y.o. female who presents to the Emergency Department complaining of new CP described as a tightness that began this morning. Pt states she has had associated SOB and nausea. Pt states that she tried some cough medicine with no relief. Pt denies vomiting or diaphoresis.   Past Medical History  Diagnosis Date  . GERD (gastroesophageal reflux disease)   . Hepatitis B   . Hyperlipemia   . Hypertension   . Cervical dysplasia   . Atrophic vaginitis   . Spinal stenosis     s/p decompression  . CAD (coronary artery disease) April 2008    s/p CABG;    cath 8/12:  LM patent, pLAD occluded, OM1 30-40%, pOM2 80% and 60% after the anastomosis, pRCA occluded, S-DX occluded (old), S-OM 2 occluded (new), L-LAD patent, dLAD provided collats to the PDA; Med Rx  rec.; consider PCI to OM2 if fails med Rx  . Hx of hysterectomy     daughter, Bianca [name], denies this hx on 08/23/2014  . UTI (lower urinary tract infection)   . Diverticulosis of intestine 04/29/2014    Of ascending colon noted on CT abd/ pelvis 04/20/14  . Diabetes mellitus, type 2     insulin dependent  . COPD (chronic obstructive pulmonary disease)     Bianca [name] 08/23/2014  . Chronic kidney disease (CKD), stage IV (severe)     Bianca [name] 08/23/2014  . CHF (congestive heart failure)     Bianca [name] 08/23/2014  . Chronic lower back pain   . Shortness of breath  dyspnea   . UTI (urinary tract infection) 09/2014   Past Surgical History  Procedure Laterality Date  . Esophagogastroduodenoscopy Left 04/22/2014    Procedure: ESOPHAGOGASTRODUODENOSCOPY (EGD);  Surgeon: Shirley Friar, MD;  Location: [facility] ENDOSCOPY;  Service: Endoscopy;  Laterality: Left;  . Total abdominal hysterectomy      cervical dysplasia; daughter, Bianca [name] denies this hx on 08/23/2014  . Back surgery    . Lumbar laminectomy/decompression microdiscectomy  02/2000    Central and lateral decompression L3-4, L4-5, L5-S1./notes 09/15/2010  . Coronary artery bypass graft  April 2008    "CABG X5"  . Cardiac catheterization  08/2006; 09/2006; 2011,  12/2010    Bianca [name] 5/15/2012Marland Kitchen Bianca [name] 5/2/2012Marland Kitchen Bianca [name] 09/14/2010   No family history on file. Social History  Substance Use Topics  . Smoking status: Never Smoker   . Smokeless tobacco: Never Used  . Alcohol Use: No   OB History    No data available      Review of Systems  Constitutional: Negative for diaphoresis.  Respiratory: Positive for shortness of breath.   Cardiovascular: Positive for chest pain.  Gastrointestinal: Positive for nausea. Negative for vomiting.  All other systems reviewed and are negative.    Allergies  Cozaar and Lisinopril  Home Medications   Prior to Admission medications  Medication Sig Start Date End Date Taking? Authorizing Provider  acetaminophen (TYLENOL) 500 MG tablet Take 2 tablets (1,000 mg total) by mouth every 8 (eight) hours as needed for mild pain. 12/25/13 12/25/14  Su Hoff, MD  albuterol (PROAIR HFA) 108 (90 BASE) MCG/ACT inhaler INHALE 1 PUFF EVERY 6 HOURS AS NEEDED FOR WHEEZE 10/15/14   Carly J Rivet, MD  amLODipine (NORVASC) 10 MG tablet Take 1 tablet (10 mg total) by mouth daily. 08/14/14   Gust Rung, DO  aspirin EC 81 MG tablet TAKE 1 TABLET BY MOUTH EVERY DAY 01/03/13   Lorretta Harp, MD  B-D ULTRAFINE III SHORT PEN 31G X 8 MM MISC USE AS DIRECTED TO INJECT INSULIN 3 TIMES A DAY 03/12/14    Su Hoff, MD  BAYER MICROLET LANCETS lancets Use as instructed 08/14/14   Gust Rung, DO  benzonatate (TESSALON PERLES) 100 MG capsule Take 1 capsule (100 mg total) by mouth 3 (three) times daily as needed for cough. 11/05/14 11/05/15  Carly J Rivet, MD  carvedilol (COREG) 25 MG tablet TAKE 1 TABLET (25 MG TOTAL) BY MOUTH 2 (TWO) TIMES DAILY WITH A MEAL. 04/23/14   Su Hoff, MD  cholecalciferol (VITAMIN D) 1000 UNITS tablet Take 2 tablets (2,000 Units total) by mouth daily. 10/30/14   Carly J Rivet, MD  CRESTOR 20 MG tablet TAKE 1 TABLET (20 MG TOTAL) BY MOUTH DAILY. 11/22/13   Levert Feinstein, MD  fluticasone (FLOVENT HFA) 44 MCG/ACT inhaler Inhale 1 puff into the lungs 2 (two) times daily. 06/19/14   Harold Barban, MD  furosemide (LASIX) 40 MG tablet Take 1 tablet (40 mg total) by mouth as needed (leg swelling, shortness of breath). 11/05/14   Su Hoff, MD  furosemide (LASIX) 40 MG tablet Take 1 tablet (40 mg total) by mouth daily. 12/08/14   Darreld Mclean, MD  gabapentin (NEURONTIN) 100 MG capsule Take 1 capsule (100 mg total) by mouth daily. 11/19/14 11/19/15  Carly J Rivet, MD  glucose blood (BAYER CONTOUR NEXT TEST) test strip 1 each by Other route as needed for other. Use 4 to 5 times daily to check blood sugar. diag code E11.9. Insulin dependent 08/14/14   Gust Rung, DO  hydrALAZINE (APRESOLINE) 25 MG tablet Take 1 tablet (25 mg total) by mouth 3 (three) times daily. 10/30/14   Carly Arlyce Harman, MD  insulin aspart (NOVOLOG FLEXPEN) 100 UNIT/ML FlexPen Please take 3 units with breakfast, 3 units with lunch, and 3 units with dinner/supper 10/15/14   Carly J Rivet, MD  insulin glargine (LANTUS) 100 UNIT/ML injection Inject 0.05 mLs (5 Units total) into the skin at bedtime. 08/27/14   Harold Barban, MD  isosorbide mononitrate (IMDUR) 120 MG 24 hr tablet Take 120 mg by mouth daily. 09/04/14   Historical Provider, MD  nitroGLYCERIN (NITROSTAT) 0.4 MG SL tablet Place 0.4 mg under the tongue every  5 (five) minutes as needed. For chest pain. Maximum of 3 doses    Historical Provider, MD  omeprazole (PRILOSEC) 20 MG capsule TAKE 1 CAPSULE (20 MG TOTAL) BY MOUTH DAILY. 07/30/14   Su Hoff, MD  simethicone (GAS-X) 80 MG chewable tablet Chew 1 tablet (80 mg total) by mouth every 6 (six) hours as needed for flatulence. 11/19/14   Carly Arlyce Harman, MD  sodium bicarbonate 650 MG tablet Take 1 tablet (650 mg total) by mouth 2 (two) times daily. 09/28/14   Rushil Terrilee Croak, MD   BP 157/82  mmHg  Pulse 81  Temp(Src) 98.5 F (36.9 C) (Oral)  Resp 16  Ht 5\' 1"  (1.549 m)  Wt 108 lb 4.8 oz (49.125 kg)  BMI 20.47 kg/m2  SpO2 100%   Physical Exam  Constitutional: She is oriented to person, place, and time. She appears well-developed and well-nourished. No distress.  HENT:  Head: Normocephalic and atraumatic.  Eyes: Conjunctivae and EOM are normal.  Neck: Neck supple. No tracheal deviation present.  Cardiovascular: Normal rate.   Pulmonary/Chest: Effort normal. No respiratory distress.  Musculoskeletal: Normal range of motion.  Neurological: She is alert and oriented to person, place, and time.  Skin: Skin is warm and dry.  Psychiatric: She has a normal mood and affect. Her behavior is normal.  Nursing note and vitals reviewed.   ED Course  Procedures (including critical care time) DIAGNOSTIC STUDIES: Oxygen Saturation is 100% on RA, normal by my interpretation.    COORDINATION OF CARE: 2:22 AM-Discussed treatment plan with pt at bedside and pt agreed to plan.     Labs Review Labs Reviewed  BASIC METABOLIC PANEL - Abnormal; Notable for the following:    CO2 20 (*)    Glucose, Bld 167 (*)    BUN 52 (*)    Creatinine, Ser 3.66 (*)    Calcium 8.6 (*)    GFR calc non Af Amer 11 (*)    GFR calc Af Amer 12 (*)    All other components within normal limits  CBC WITH DIFFERENTIAL/PLATELET - Abnormal; Notable for the following:    RBC 2.71 (*)    Hemoglobin 8.2 (*)    HCT 25.1 (*)     Platelets 103 (*)    Neutrophils Relative % 83 (*)    Lymphocytes Relative 10 (*)    All other components within normal limits  TROPONIN I    Imaging Review Dg Chest 2 View  12/10/2014   CLINICAL DATA:  One day history of chest pain  EXAM: CHEST  2 VIEW  COMPARISON:  December 07, 2014 and June 20, 2014  FINDINGS: There is persistent interstitial prominence. There is no airspace consolidation or new opacity. Heart is upper normal in size with pulmonary vascularity within normal limits. No adenopathy. Patient is status post coronary artery bypass grafting. There is atherosclerotic change in the aorta. No adenopathy. There is degenerative change in the left shoulder.  IMPRESSION: Interstitial prominence, stable. Suspect chronic inflammatory type change, although there may be a slight degree of chronic congestive heart failure. No airspace consolidation. No change in cardiac silhouette.   Electronically Signed   By: Bretta Bang III M.D.   On: 12/10/2014 22:30   ECG Interpretation: Normal sinus rhythm at 86 bpm. Normal axis. Normal intervals. Normal ST and T waves. Borderline significant inferior wall Q waves. When compared with ECG of 12/08/2014, no significant changes are seen.  MDM   Final diagnoses:  Chest pain, unspecified chest pain type  Renal insufficiency  Metabolic acidosis  Normochromic normocytic anemia  Thrombocytopenia      Chest pains which are somewhat atypical. Old records are reviewed, and patient was just admitted for evaluation of congestive heart failure and chest pain. ECG shows no change from baseline and troponin is negative. I discussed case with the internal medicine resident on call and he can arrange prompt follow-up in the clinic in the next 1-2 days. Patient was kept in the ED for delta troponin which is normal. She has renal insufficiency, metabolic acidosis, anemia, thrombocytopenia which are  all unchanged from baseline and probably all related to her  underlying renal disease.   I personally performed the services described in this documentation, which was scribed in my presence. The recorded information has been reviewed and is accurate.       Bianca Booze, MD 12/11/14 [phone]

## 2014-12-16 ENCOUNTER — Other Ambulatory Visit: Payer: Self-pay | Admitting: Internal Medicine

## 2014-12-19 ENCOUNTER — Ambulatory Visit (INDEPENDENT_AMBULATORY_CARE_PROVIDER_SITE_OTHER): Payer: Medicare Other | Admitting: Internal Medicine

## 2014-12-19 ENCOUNTER — Encounter: Payer: Self-pay | Admitting: Internal Medicine

## 2014-12-19 VITALS — BP 123/52 | HR 73 | Temp 98.2°F | Wt 106.1 lb

## 2014-12-19 DIAGNOSIS — Z794 Long term (current) use of insulin: Secondary | ICD-10-CM | POA: Diagnosis not present

## 2014-12-19 DIAGNOSIS — G8929 Other chronic pain: Secondary | ICD-10-CM | POA: Diagnosis not present

## 2014-12-19 DIAGNOSIS — I5042 Chronic combined systolic (congestive) and diastolic (congestive) heart failure: Secondary | ICD-10-CM | POA: Diagnosis not present

## 2014-12-19 DIAGNOSIS — M542 Cervicalgia: Secondary | ICD-10-CM | POA: Diagnosis not present

## 2014-12-19 DIAGNOSIS — E1122 Type 2 diabetes mellitus with diabetic chronic kidney disease: Secondary | ICD-10-CM | POA: Diagnosis not present

## 2014-12-19 DIAGNOSIS — I132 Hypertensive heart and chronic kidney disease with heart failure and with stage 5 chronic kidney disease, or end stage renal disease: Secondary | ICD-10-CM

## 2014-12-19 DIAGNOSIS — M549 Dorsalgia, unspecified: Secondary | ICD-10-CM

## 2014-12-19 DIAGNOSIS — N185 Chronic kidney disease, stage 5: Secondary | ICD-10-CM

## 2014-12-19 DIAGNOSIS — I10 Essential (primary) hypertension: Secondary | ICD-10-CM

## 2014-12-19 LAB — GLUCOSE, CAPILLARY: GLUCOSE-CAPILLARY: 125 mg/dL — AB (ref 65–99)

## 2014-12-19 LAB — POCT GLYCOSYLATED HEMOGLOBIN (HGB A1C): Hemoglobin A1C: 6.8

## 2014-12-19 MED ORDER — INSULIN ASPART 100 UNIT/ML FLEXPEN: 6.0000 [IU] | PEN_INJECTOR | Freq: Three times a day (TID) | SUBCUTANEOUS | Status: DC

## 2014-12-19 MED ORDER — INSULIN GLARGINE 100 UNIT/ML ~~LOC~~ SOLN: 6.0000 [IU] | Freq: Every day | SUBCUTANEOUS | Status: AC

## 2014-12-19 NOTE — Progress Notes (Signed)
Internal Medicine Clinic Attending  Case discussed with Dr. Rivet at the time of the visit.  We reviewed the resident's history and exam and pertinent patient test results.  I agree with the assessment, diagnosis, and plan of care documented in the resident's note.  

## 2014-12-19 NOTE — Assessment & Plan Note (Signed)
Patient with longstanding history of back pain and neck pain likely secondary to arthritis. She states pain is mild and relieved with Tylenol. She has tried voltaren gel with no relief. She reports she has been using a similar OTC topical that seems to be helping. I recommended for her to try a heating pad and to take Tylenol 1000 mg Q8H PRN for her pain.

## 2014-12-19 NOTE — Assessment & Plan Note (Signed)
Lab Results  Component Value Date   HGBA1C 6.8 12/19/2014   HGBA1C 6.9 09/03/2014   HGBA1C 7.3 06/19/2014     Assessment: Diabetes control: good control (HgbA1C at goal) Progress toward A1C goal:  at goal Comments: DM well controlled. Reviewing her glucometer, blood sugars mostly in 110s-160s throughout the day. I only saw one low blood sugar of 50 in the AM during the past 3 weeks. Patient states she knew she had hypoglycemia for this reading. She denies any other hypoglycemic events.   Plan: Medications:  Continue Lantus 6 units QHS and Novolog 6 units TID with meals Home glucose monitoring: Frequency: 3 times a day Timing: before meals Instruction/counseling given: reminded to get eye exam, reminded to bring blood glucose meter & log to each visit and reminded to bring medications to each visit Other plans:  - Repeat HbA1c in 3 months - Foot exam today

## 2014-12-19 NOTE — Assessment & Plan Note (Signed)
BP Readings from Last 3 Encounters:  12/19/14 123/52  12/11/14 116/57  12/08/14 154/65    Lab Results  Component Value Date   NA 139 12/10/2014   K 4.7 12/10/2014   CREATININE 3.66* 12/10/2014    Assessment: Blood pressure control: controlled Progress toward BP goal:  at goal Comments: BP well controlled  Plan: Medications:  Continue Amlodipine 10 mg daily, Coreg 25 mg BID, Lasix 40 mg daily PRN, and Hydralazine 25 mg TID

## 2014-12-19 NOTE — Patient Instructions (Signed)
-   Try a heating pad for your neck and back pain - You can take Tylenol 1000 mg every 8 hours as needed - Follow up in 3 months  General Instructions:   Thank you for bringing your medicines today. This helps [place] keep you safe from mistakes.   Progress Toward Treatment Goals:  Treatment Goal 11/19/2014  Hemoglobin A1C at goal  Blood pressure at goal  Prevent falls -    Self Care Goals & Plans:  Self Care Goal 11/19/2014  Manage my medications take my medicines as prescribed; bring my medications to every visit; refill my medications on time  Monitor my health keep track of my blood glucose; bring my glucose meter and log to each visit; keep track of my blood pressure; check my feet daily  Eat healthy foods eat more vegetables; eat foods that are low in salt  Be physically active find an activity I enjoy  Meeting treatment goals -    Home Blood Glucose Monitoring 11/19/2014  Check my blood sugar 3 times a day  When to check my blood sugar before meals     Care Management & Community Referrals:  Referral 11/05/2014  Referrals made for care management support social worker  Referrals made to community resources -

## 2014-12-19 NOTE — Assessment & Plan Note (Signed)
Patient with recent hospitalization and ED visit this month for chest pain and a CHF exacerbation. ACS was ruled out both times with negative troponins and no EKG changes from baseline. I suspect that her chest pain is multifactorial as she has had many hospitalizations for recurrent chest pain for which ACS has been ruled out. It is likely her history of CKD Stage 5 (not on HD), COPD, anemia of chronic disease, and GERD are all contributing factors. I have instructed family that if she has chest pain again to call the clinic and see if she can be evaluated in clinic first. Of course I emphasized that if her chest pain is severe or is an apparent emergency to report to the ED.  - Continue current medications

## 2014-12-19 NOTE — Progress Notes (Signed)
   Subjective:    Patient ID: Bianca [name], female    DOB: Sep 17, 1930, 81 y.o.   MRN: [id number]  HPI Bianca [name] is a 81yo woman with PMHx of CKD Stage 5, recurrent UTI, pAF, CAD s/p CABG, CHF, type 2 diabetes, HTN, hyperlipidemia, chronic normocytic anemia, COPD, GERD presenting for follow-up from an ED visit.  She was seen in the ED on 12/11/14 for chest pain that was associated with nausea and shortness of breath. She was just admitted to the hospital from 8/6-8/7 for an acute on chronic CHF exacerbation. Her troponins were negative and EKG without any changes from baseline. Patient denies any further episodes of chest pain, dyspnea, or nausea since bing evaluated in the ED. She reports she is taking all of her cardiac medications.    Review of Systems General: Denies fever, chills, night sweats, changes in weight, changes in appetite HEENT: Denies headaches, ear pain, changes in vision, rhinorrhea, sore throat CV: Denies CP, palpitations, SOB, orthopnea Pulm: Denies SOB, cough, wheezing GI: Denies abdominal pain, nausea, vomiting, diarrhea, constipation, melena, hematochezia GU: Denies dysuria, hematuria, frequency Msk: Reports neck pain and back pain. Denies muscle cramps Neuro: Denies weakness, numbness, tingling Skin: Denies rashes, bruising    Objective:   Physical Exam General: alert, sitting up in chair, pleasant, NAD HEENT: [place]/AT, EOMI, sclera anicteric, mucus membranes moist CV: RRR, no m/g/r Pulm: CTA bilaterally, breaths non-labored Abd: BS+, soft, non-tender Ext: warm, no peripheral edema Neuro: alert and oriented x 3, no focal deficits     Assessment & Plan:  Refer to A&P documentation.

## 2015-01-09 ENCOUNTER — Ambulatory Visit (HOSPITAL_COMMUNITY)
Admission: RE | Admit: 2015-01-09 | Discharge: 2015-01-09 | Disposition: A | Discharge status: Home / Self Care | Payer: Medicare Other | Source: Ambulatory Visit | Attending: Internal Medicine | Admitting: Internal Medicine

## 2015-01-09 ENCOUNTER — Encounter: Payer: Self-pay | Admitting: Internal Medicine

## 2015-01-09 ENCOUNTER — Ambulatory Visit (INDEPENDENT_AMBULATORY_CARE_PROVIDER_SITE_OTHER): Payer: Medicare Other | Admitting: Internal Medicine

## 2015-01-09 VITALS — BP 146/62 | HR 74 | Temp 98.3°F | Wt 108.7 lb

## 2015-01-09 DIAGNOSIS — J449 Chronic obstructive pulmonary disease, unspecified: Secondary | ICD-10-CM | POA: Diagnosis not present

## 2015-01-09 DIAGNOSIS — J849 Interstitial pulmonary disease, unspecified: Secondary | ICD-10-CM | POA: Diagnosis not present

## 2015-01-09 DIAGNOSIS — I480 Paroxysmal atrial fibrillation: Secondary | ICD-10-CM

## 2015-01-09 DIAGNOSIS — J90 Pleural effusion, not elsewhere classified: Secondary | ICD-10-CM | POA: Insufficient documentation

## 2015-01-09 DIAGNOSIS — R0602 Shortness of breath: Secondary | ICD-10-CM

## 2015-01-09 DIAGNOSIS — N185 Chronic kidney disease, stage 5: Secondary | ICD-10-CM

## 2015-01-09 DIAGNOSIS — I5042 Chronic combined systolic (congestive) and diastolic (congestive) heart failure: Secondary | ICD-10-CM | POA: Diagnosis present

## 2015-01-09 LAB — TROPONIN I: Troponin I: <0.03 ng/mL (ref <0.031)

## 2015-01-09 LAB — COMPREHENSIVE METABOLIC PANEL
ALK PHOS: 59 U/L (ref 38–126)
ALT: 27 U/L (ref 14–54)
AST: 26 U/L (ref 15–41)
Albumin: 3.3 g/dL — ABNORMAL LOW (ref 3.5–5.0)
Anion gap: 9 (ref 5–15)
BUN: 43 mg/dL — AB (ref 6–20)
CALCIUM: 8.9 mg/dL (ref 8.9–10.3)
CO2: 20 mmol/L — AB (ref 22–32)
CREATININE: 3.6 mg/dL — AB (ref 0.44–1.00)
Chloride: 111 mmol/L (ref 101–111)
GFR calc non Af Amer: 11 mL/min — ABNORMAL LOW (ref >60)
GFR, EST AFRICAN AMERICAN: 13 mL/min — AB (ref >60)
GLUCOSE: 235 mg/dL — AB (ref 65–99)
Potassium: 4.4 mmol/L (ref 3.5–5.1)
SODIUM: 140 mmol/L (ref 135–145)
Total Bilirubin: 0.6 mg/dL (ref 0.3–1.2)
Total Protein: 6.3 g/dL — ABNORMAL LOW (ref 6.5–8.1)

## 2015-01-09 LAB — BRAIN NATRIURETIC PEPTIDE: B Natriuretic Peptide: 1189.2 pg/mL — ABNORMAL HIGH (ref 0.0–100.0)

## 2015-01-09 LAB — CBC
HCT: 24.7 % — ABNORMAL LOW (ref 36.0–46.0)
Hemoglobin: 7.9 g/dL — ABNORMAL LOW (ref 12.0–15.0)
MCH: 29.5 pg (ref 26.0–34.0)
MCHC: 32 g/dL (ref 30.0–36.0)
MCV: 92.2 fL (ref 78.0–100.0)
PLATELETS: 103 10*3/uL — AB (ref 150–400)
RBC: 2.68 MIL/uL — ABNORMAL LOW (ref 3.87–5.11)
RDW: 14 % (ref 11.5–15.5)
WBC: 6.1 10*3/uL (ref 4.0–10.5)

## 2015-01-09 NOTE — Addendum Note (Signed)
Addended by: Onnie Boer on: 01/09/2015 03:03 PM   Modules accepted: Orders

## 2015-01-09 NOTE — Progress Notes (Signed)
Has an appt at [facility] [place] Rocky Crafts [phone]. Office notes from today faxed to office. [facility] Kayelyn Lemon RN 01/09/15 4:10PM

## 2015-01-09 NOTE — Assessment & Plan Note (Signed)
Pts history initially concerning for CHF exacerbation, weight increased slowly by 4 Lbs since may. She has not been taking her lasix everyday- Dose  Daily.   - Stat Chest xray- Without evidence of fluid overload, but has a new small left pleural effusion. - BNP- 1189, appears to be at her baseline.  - Trop X1- neg - Pt encouraged to take her lasix everyday - Stat CBC- Hgb- 7.9 which might explain her dypsea with exertion, was 8.7 one month ago. Appears to have been trending down slowly. Anemia panel- suggests anemia of chronic disease, likely CKD. Should be on Epo/aranesp with CKD5.  - Make an appointment for pt to follow up with nephrologist for EPO.

## 2015-01-09 NOTE — Progress Notes (Signed)
Patient ID: MAKINZY CLEERE, female   DOB: May 10, 1930, 81 y.o.   MRN: [id number]   Subjective:   Patient ID: ANNALI LYBRAND female   DOB: 08/10/1930 81 y.o.   MRN: [id number]  HPI: Ms.Jennifr T Brzozowski is a 81 y.o. with PMH listed below, presented with c/o chest tightness - started last night , SOB with exertion, this started about a week ago, with orthopnea, she had to sit up to sleep last night, but has been getting worse gradually. Sometimes they notice leg swelling, but not today.  Cough is also present, started 2 days ago, mild, not productive. Pt also feel her stomach is bigger than usual with reduced appetite, but she has been constipated for days and just took miralax and was able to have a bowel movement today- which was hard, but no blood, normal stool colour. No fever, no specific chills, pt is usually cold. Pt feels ill.  Never smoked cigs. Pt is complaint with her medications.  No Vomiting. No sick contacts.   Past Medical History  Diagnosis Date  . GERD (gastroesophageal reflux disease)   . Hepatitis B   . Hyperlipemia   . Hypertension   . Cervical dysplasia   . Atrophic vaginitis   . Spinal stenosis     s/p decompression  . CAD (coronary artery disease) April 2008    s/p CABG;    cath 8/12:  LM patent, pLAD occluded, OM1 30-40%, pOM2 80% and 60% after the anastomosis, pRCA occluded, S-DX occluded (old), S-OM 2 occluded (new), L-LAD patent, dLAD provided collats to the PDA; Med Rx  rec.; consider PCI to OM2 if fails med Rx  . Hx of hysterectomy     daughter, Rocky Crafts, denies this hx on 08/23/2014  . UTI (lower urinary tract infection)   . Diverticulosis of intestine 04/29/2014    Of ascending colon noted on CT abd/ pelvis 04/20/14  . Diabetes mellitus, type 2     insulin dependent  . COPD (chronic obstructive pulmonary disease)     Hattie Perch 08/23/2014  . Chronic kidney disease (CKD), stage IV (severe)     Hattie Perch 08/23/2014  . CHF (congestive heart failure)     Hattie Perch 08/23/2014  . Chronic  lower back pain   . Shortness of breath dyspnea   . UTI (urinary tract infection) 09/2014   Review of Systems: CONSTITUTIONAL- No Fever, weightloss, night sweat or change in appetite. SKIN- No Rash, colour changes or itching. HEAD- No Headache or dizziness. RESPIRATORY- No Cough or SOB. CARDIAC- No Palpitations, DOE, PND or chest pain. GI- No nausea, vomiting, diarrhoea, constipation, abd pain. URINARY- No Frequency, or dysuria.  Objective:  Physical Exam: Filed Vitals:   01/09/15 0946  BP: 146/62  Pulse: 74  Temp: 98.3 F (36.8 C)  TempSrc: Oral  Weight: 108 lb 11.2 oz (49.306 kg)  SpO2: 100%   GENERAL- alert, co-operative, appears as stated age, not in any distress. HEENT- Atraumatic, normocephalic, PERRL, EOMI, oral mucosa appears moist, neck supple. CARDIAC- RRR, no murmurs, rubs or gallops. RESP- Moving equal volumes of air, and clear to auscultation bilaterally, NO wheezes or minimal crackles- left lung base. ABDOMEN- Soft, nontender, no palpable masses or organomegaly, bowel sounds present. BACK- Normal curvature of the spine, No tenderness along the vertebrae, no CVA tenderness. NEURO- No obvious Cr N abnormality,  EXTREMITIES- Warm and well perfused, +1 pitting pedal edema to below knees. SKIN- Warm, dry, No rash or lesion. PSYCH- Normal mood and affect, appropriate thought content and  speech.  Assessment & Plan:   The patient's case and plan of care was discussed with attending physician, Dr. Criselda Peaches  Please see problem based charting for assessment and plan.

## 2015-01-09 NOTE — Assessment & Plan Note (Signed)
Pt to followup with nephrology for EPO.Stan Head. Stat Cmet today showed Cr at baseline. Encouraged Lasix-  Everyday. - See in 1 week.

## 2015-01-09 NOTE — Assessment & Plan Note (Signed)
REgular heart rate today. Rate- 74bpm, complaint with coreg  BID.

## 2015-01-09 NOTE — Patient Instructions (Signed)
Please take your lasix everyday.  We will try to get you to see your kidney doctor as soon as possible, because your blood counts are low.   We will see you in 1 week.

## 2015-01-10 ENCOUNTER — Ambulatory Visit (INDEPENDENT_AMBULATORY_CARE_PROVIDER_SITE_OTHER): Payer: Medicare Other | Admitting: Internal Medicine

## 2015-01-10 VITALS — BP 135/61 | HR 66 | Temp 97.8°F | Ht 61.0 in | Wt 106.6 lb

## 2015-01-10 DIAGNOSIS — Z7982 Long term (current) use of aspirin: Secondary | ICD-10-CM

## 2015-01-10 DIAGNOSIS — Z7951 Long term (current) use of inhaled steroids: Secondary | ICD-10-CM | POA: Diagnosis not present

## 2015-01-10 DIAGNOSIS — K5909 Other constipation: Secondary | ICD-10-CM

## 2015-01-10 DIAGNOSIS — J441 Chronic obstructive pulmonary disease with (acute) exacerbation: Secondary | ICD-10-CM

## 2015-01-10 LAB — GLUCOSE, CAPILLARY: Glucose-Capillary: 208 mg/dL — ABNORMAL HIGH (ref 65–99)

## 2015-01-10 MED ORDER — AZITHROMYCIN 250 MG PO TABS: ORAL_TABLET | ORAL | Status: AC

## 2015-01-10 MED ORDER — PREDNISONE 20 MG PO TABS: 40.0000 mg | ORAL_TABLET | Freq: Every day | ORAL | Status: DC

## 2015-01-10 MED ORDER — POLYETHYLENE GLYCOL 3350 17 G PO PACK
17.0000 g | PACK | Freq: Every day | ORAL | Status: DC
Start: 2015-01-10 — End: 2015-01-17

## 2015-01-10 NOTE — Assessment & Plan Note (Signed)
Pt daughter brought pt back in today with complaints that last night pt was wheezing, and had increased SOB, with subjective fever. Pts hx supportive of a COPD exacerbation- Increased sob, cough and sputum production- Whitish sputum increased in quantity. Pt is not on Home O2, O2 sats here in clinic- 100%, Temp- 97.8, Exam though shows good air entry without wheezing. PFTs 07/2014 showed FEV1/FVC- 63%, also FEV1- 66%, DLCO though was markedly reduced at 29%, though pt ws anemic at the time, and at this time i am unsure what her fluid status was like, as pulmonary edema, is likely to affect accuracy of results.  Pt denies ever smoking cigarettes, but she stayed in [place] for at least 40 years, was born in [place]. She was exposed to wood stoves during this time.  Work up yesterday revealed mild interstial edema, and diffuse chronic interstitial lung disease, BNP at baseline, Trop X1- Neg, Hgb low at 7.9. Last chest CT- was a CTA 2008, showed old granulomatous disease.   Plan- Will treat s COPD exacerbation, considering Hx. - Prednsione  daily X5 days - Z- pac - O2 with Ambulation in clinic- 100% - Noted on PFTresults was-  recommended evaluation for hypoxemia with exercise in the light of significantly reduced DLCO. - Consider getting high resolution CT in the light of significantly reduced DLCO, And chest xray findings suggesting diffuse chronic interstitial lung disease, but patient fluid status should be optimized first with diuretics - Cont LAsix  daily, weight down 2Lbs since yesterday - Miralax for constipation. - Follow up- 01/14/2015. - Consider checking TSH when patient appears well and back to baseline, as she says she is always cold.

## 2015-01-10 NOTE — Progress Notes (Signed)
Patient ID: Bianca [name], female   DOB: 1930-07-21, 81 y.o.   MRN: [id number]   Subjective:   Patient ID: Bianca [name] female   DOB: [birth year] 81 y.o.   MRN: [id number]  HPI: [place] speaking, Hx obtained with the help of interpreter phone.  Bianca [name] is a 81 y.o. with PMH listed below, presented today after been seen yesterday for persistent complaints of SOB with new wheezing and purulent sputum. See assessment and plan.   Past Medical History  Diagnosis Date  . GERD (gastroesophageal reflux disease)   . Hepatitis B   . Hyperlipemia   . Hypertension   . Cervical dysplasia   . Atrophic vaginitis   . Spinal stenosis     s/p decompression  . CAD (coronary artery disease) April 2008    s/p CABG;    cath 8/12:  LM patent, pLAD occluded, OM1 30-40%, pOM2 80% and 60% after the anastomosis, pRCA occluded, S-DX occluded (old), S-OM 2 occluded (new), L-LAD patent, dLAD provided collats to the PDA; Med Rx  rec.; consider PCI to OM2 if fails med Rx  . Hx of hysterectomy     daughter, Bianca [name], denies this hx on 08/23/2014  . UTI (lower urinary tract infection)   . Diverticulosis of intestine 04/29/2014    Of ascending colon noted on CT abd/ pelvis 04/20/14  . Diabetes mellitus, type 2     insulin dependent  . COPD (chronic obstructive pulmonary disease)     Hattie Perch 08/23/2014  . Chronic kidney disease (CKD), stage IV (severe)     Hattie Perch 08/23/2014  . CHF (congestive heart failure)     Hattie Perch 08/23/2014  . Chronic lower back pain   . Shortness of breath dyspnea   . UTI (urinary tract infection) 09/2014   Current Outpatient Prescriptions  Medication Sig Dispense Refill  . albuterol (PROAIR HFA) 108 (90 BASE) MCG/ACT inhaler INHALE 1 PUFF EVERY 6 HOURS AS NEEDED FOR WHEEZE 8.5 each 2  . amLODipine (NORVASC) 10 MG tablet Take 1 tablet (10 mg total) by mouth daily. 90 tablet 1  . aspirin EC 81 MG tablet TAKE 1 TABLET BY MOUTH EVERY DAY 150 tablet 1  . B-D ULTRAFINE III SHORT PEN 31G X 8 MM  MISC USE AS DIRECTED TO INJECT INSULIN 3 TIMES A DAY 100 each 6  . BAYER MICROLET LANCETS lancets Use as instructed 200 each 5  . benzonatate (TESSALON PERLES) 100 MG capsule Take 1 capsule (100 mg total) by mouth 3 (three) times daily as needed for cough. 30 capsule 0  . carvedilol (COREG) 25 MG tablet TAKE 1 TABLET (25 MG TOTAL) BY MOUTH 2 (TWO) TIMES DAILY WITH A MEAL. 120 tablet 3  . cholecalciferol (VITAMIN D) 1000 UNITS tablet Take 2 tablets (2,000 Units total) by mouth daily. 60 tablet 3  . CRESTOR 20 MG tablet TAKE 1 TABLET (20 MG TOTAL) BY MOUTH DAILY. 90 tablet 4  . docusate sodium (COLACE) 250 MG capsule Take 250 mg by mouth daily.    . furosemide (LASIX) 40 MG tablet Take 1 tablet (40 mg total) by mouth as needed (leg swelling, shortness of breath). 30 tablet 2  . glucose blood (BAYER CONTOUR NEXT TEST) test strip 1 each by Other route as needed for other. Use 4 to 5 times daily to check blood sugar. diag code E11.9. Insulin dependent 200 each 5  . hydrALAZINE (APRESOLINE) 25 MG tablet Take 1 tablet (25 mg total) by mouth 3 (three) times daily.  270 tablet 1  . insulin aspart (NOVOLOG FLEXPEN) 100 UNIT/ML FlexPen Inject 6 Units into the skin 3 (three) times daily with meals. 100 mL 9  . insulin glargine (LANTUS) 100 UNIT/ML injection Inject 0.06 mLs (6 Units total) into the skin at bedtime. 10 mL 5  . isosorbide mononitrate (IMDUR) 120 MG 24 hr tablet Take 120 mg by mouth daily.  1  . nitroGLYCERIN (NITROSTAT) 0.4 MG SL tablet Place 0.4 mg under the tongue every 5 (five) minutes as needed. For chest pain. Maximum of 3 doses    . simethicone (GAS-X) 80 MG chewable tablet Chew 1 tablet (80 mg total) by mouth every 6 (six) hours as needed for flatulence. 30 tablet 0  . sodium bicarbonate 650 MG tablet Take 1 tablet (650 mg total) by mouth 2 (two) times daily. (Patient taking differently: Take 650 mg by mouth 3 (three) times daily. ) 30 tablet 0   No current facility-administered medications  for this visit.   No family history on file. Social History   Social History  . Marital Status: Widowed    Spouse Name: N/A  . Number of Children: N/A  . Years of Education: N/A   Social History Main Topics  . Smoking status: Never Smoker   . Smokeless tobacco: Never Used  . Alcohol Use: No  . Drug Use: No  . Sexual Activity: Not on file   Other Topics Concern  . Not on file   Social History Narrative   Chinese immigrant   Housewife   Widowed   Lives w/ her son   Review of Systems: CONSTITUTIONAL- Enodrses subjective Fever last night, no weightloss, reduced appetite. SKIN- No Rash, colour changes or itching. RESPIRATORY- Increased productive cough and sputum, increased SOB. CARDIAC- Has chest tightness from difficulty breathing, but denies chest pain.  GI- Feels abdomen is full, constipated, no abd pain. URINARY- No Frequency, urgency, straining or dysuria.  Objective:  Physical Exam: Filed Vitals:   01/10/15 1510 01/10/15 1603  BP: 135/61   Pulse: 66   Temp: 97.8 F (36.6 C)   TempSrc: Oral   Height: 5\' 1"  (1.549 m)   Weight: 106 lb 9.6 oz (48.353 kg)   SpO2: 100% 99%   GENERAL- alert, co-operative, appears as stated age, not in any distress. HEENT- Atraumatic, normocephalic, PERRL CARDIAC- RRR, no murmurs, rubs or gallops. RESP- Moving equal volumes of air, and clear to auscultation bilaterally, no wheezes or crackles. ABDOMEN- Soft, nontender, not distended, no guarding or rebound, no palpable masses or organomegaly, bowel sounds present. NEURO- Moving all extremities voluntarily. EXTREMITIES- pulse 2+, symmetric, +1 pitting edema to mid leg SKIN- Warm, dry, No rash or lesion. PSYCH- Normal mood and affect, appropriate thought content and speech.  Assessment & Plan:  The patient's case and plan of care was discussed with attending physician, Dr. Oswaldo Done.  Please see problem based charting for assessment and plan.

## 2015-01-10 NOTE — Patient Instructions (Signed)
Take two tablets of prednisone every for 5 days.   For the antibiotic- Z - pac- Take two tablets to start , then take one tablet everyday for 4 more days.   Also be sure to Take your lasix every day.  For your stomach swelling take the miralax- once day every day.

## 2015-01-13 ENCOUNTER — Telehealth: Payer: Self-pay | Admitting: Pharmacist

## 2015-01-13 ENCOUNTER — Encounter: Payer: Self-pay | Admitting: Internal Medicine

## 2015-01-13 ENCOUNTER — Ambulatory Visit (INDEPENDENT_AMBULATORY_CARE_PROVIDER_SITE_OTHER): Payer: Medicare Other | Admitting: Internal Medicine

## 2015-01-13 VITALS — BP 139/55 | HR 68 | Temp 98.1°F | Ht 61.0 in | Wt 112.6 lb

## 2015-01-13 DIAGNOSIS — J439 Emphysema, unspecified: Secondary | ICD-10-CM

## 2015-01-13 DIAGNOSIS — Z7982 Long term (current) use of aspirin: Secondary | ICD-10-CM

## 2015-01-13 DIAGNOSIS — J449 Chronic obstructive pulmonary disease, unspecified: Secondary | ICD-10-CM | POA: Diagnosis present

## 2015-01-13 DIAGNOSIS — N185 Chronic kidney disease, stage 5: Secondary | ICD-10-CM

## 2015-01-13 MED ORDER — FUROSEMIDE 40 MG PO TABS: 40.0000 mg | ORAL_TABLET | Freq: Every day | ORAL | Status: DC

## 2015-01-13 MED ORDER — PAMPERS UNDERJAMS L/XL MISC: 1.0000 [IU] | Freq: Every day | Status: AC | PRN

## 2015-01-13 NOTE — Progress Notes (Signed)
Patient ID: Bianca [name], female   DOB: October 15, 1930, 81 y.o.   MRN: [id number]   Subjective:   Patient ID: Bianca [name] female   DOB: 01-23-1931 81 y.o.   MRN: [id number]  HPI: [name] is a 81 y.o. with PMH listed below. Hx obtained with the help of interpreter present in the room. Presented today with c/o SOb with ambulation.   Past Medical History  Diagnosis Date  . GERD (gastroesophageal reflux disease)   . Hepatitis B   . Hyperlipemia   . Hypertension   . Cervical dysplasia   . Atrophic vaginitis   . Spinal stenosis     s/p decompression  . CAD (coronary artery disease) April 2008    s/p CABG;    cath 8/12:  LM patent, pLAD occluded, OM1 30-40%, pOM2 80% and 60% after the anastomosis, pRCA occluded, S-DX occluded (old), S-OM 2 occluded (new), L-LAD patent, dLAD provided collats to the PDA; Med Rx  rec.; consider PCI to OM2 if fails med Rx  . Hx of hysterectomy     daughter, Bianca [name], denies this hx on 08/23/2014  . UTI (lower urinary tract infection)   . Diverticulosis of intestine 04/29/2014    Of ascending colon noted on CT abd/ pelvis 04/20/14  . Diabetes mellitus, type 2     insulin dependent  . COPD (chronic obstructive pulmonary disease)     Bianca [name] 08/23/2014  . Chronic kidney disease (CKD), stage IV (severe)     Bianca [name] 08/23/2014  . CHF (congestive heart failure)     Bianca [name] 08/23/2014  . Chronic lower back pain   . Shortness of breath dyspnea   . UTI (urinary tract infection) 09/2014   Current Outpatient Prescriptions  Medication Sig Dispense Refill  . albuterol (PROAIR HFA) 108 (90 BASE) MCG/ACT inhaler INHALE 1 PUFF EVERY 6 HOURS AS NEEDED FOR WHEEZE 8.5 each 2  . amLODipine (NORVASC) 10 MG tablet Take 1 tablet (10 mg total) by mouth daily. 90 tablet 1  . aspirin EC 81 MG tablet TAKE 1 TABLET BY MOUTH EVERY DAY 150 tablet 1  . azithromycin (ZITHROMAX Z-PAK) 250 MG tablet Take 2 tablets (500 mg) on  Day 1,  followed by 1 tablet (250 mg) once daily on Days 2 through  5. 6 each 0  . B-D ULTRAFINE III SHORT PEN 31G X 8 MM MISC USE AS DIRECTED TO INJECT INSULIN 3 TIMES A DAY 100 each 6  . BAYER MICROLET LANCETS lancets Use as instructed 200 each 5  . benzonatate (TESSALON PERLES) 100 MG capsule Take 1 capsule (100 mg total) by mouth 3 (three) times daily as needed for cough. 30 capsule 0  . carvedilol (COREG) 25 MG tablet TAKE 1 TABLET (25 MG TOTAL) BY MOUTH 2 (TWO) TIMES DAILY WITH A MEAL. 120 tablet 3  . cholecalciferol (VITAMIN D) 1000 UNITS tablet Take 2 tablets (2,000 Units total) by mouth daily. 60 tablet 3  . CRESTOR 20 MG tablet TAKE 1 TABLET (20 MG TOTAL) BY MOUTH DAILY. 90 tablet 4  . docusate sodium (COLACE) 250 MG capsule Take 250 mg by mouth daily.    . furosemide (LASIX) 40 MG tablet Take 1 tablet (40 mg total) by mouth as needed (leg swelling, shortness of breath). 30 tablet 2  . glucose blood (BAYER CONTOUR NEXT TEST) test strip 1 each by Other route as needed for other. Use 4 to 5 times daily to check blood sugar. diag code E11.9. Insulin dependent 200 each  5  . hydrALAZINE (APRESOLINE) 25 MG tablet Take 1 tablet (25 mg total) by mouth 3 (three) times daily. 270 tablet 1  . insulin aspart (NOVOLOG FLEXPEN) 100 UNIT/ML FlexPen Inject 6 Units into the skin 3 (three) times daily with meals. 100 mL 9  . insulin glargine (LANTUS) 100 UNIT/ML injection Inject 0.06 mLs (6 Units total) into the skin at bedtime. 10 mL 5  . isosorbide mononitrate (IMDUR) 120 MG 24 hr tablet Take 120 mg by mouth daily.  1  . nitroGLYCERIN (NITROSTAT) 0.4 MG SL tablet Place 0.4 mg under the tongue every 5 (five) minutes as needed. For chest pain. Maximum of 3 doses    . polyethylene glycol (MIRALAX / GLYCOLAX) packet Take 17 g by mouth daily. 14 each 0  . predniSONE (DELTASONE) 20 MG tablet Take 2 tablets (40 mg total) by mouth daily with breakfast. 10 tablet 0  . simethicone (GAS-X) 80 MG chewable tablet Chew 1 tablet (80 mg total) by mouth every 6 (six) hours as needed for  flatulence. 30 tablet 0  . sodium bicarbonate 650 MG tablet Take 1 tablet (650 mg total) by mouth 2 (two) times daily. (Patient taking differently: Take 650 mg by mouth 3 (three) times daily. ) 30 tablet 0   No current facility-administered medications for this visit.   No family history on file. Social History   Social History  . Marital Status: Widowed    Spouse Name: N/A  . Number of Children: N/A  . Years of Education: N/A   Social History Main Topics  . Smoking status: Never Smoker   . Smokeless tobacco: Never Used  . Alcohol Use: No  . Drug Use: No  . Sexual Activity: Not Asked   Other Topics Concern  . None   Social History Narrative   Chinese immigrant   Housewife   Widowed   Lives w/ her son   Review of Systems: CONSTITUTIONAL- No Fever, change in appetite. SKIN- No Rash, colour changes or itching. RESPIRATORY- Minimal Cough or SOB. CARDIAC- No chest pain. GI- No vomiting, diarrhoea, abd pain. URINARY- No Frequency, urgency, straining or dysuria. NEUROLOGIC- No Numbness, syncope, seizures or burning.  Objective:  Physical Exam: Filed Vitals:   01/13/15 1539  BP: 139/55  Pulse: 68  Temp: 98.1 F (36.7 C)  TempSrc: Oral  Height:  (1.549 m)  Weight: 112 lb 9.6 oz (51.075 kg)  SpO2: 100%   GENERAL- alert, co-operative, appears as stated age, not in any distress. HEENT- Atraumatic, normocephalic, PERRL, EOMI, oral mucosa appears moist CARDIAC- RRR, no murmurs, rubs or gallops. RESP- Moving equal volumes of air, and clear to auscultation bilaterally, no wheezes or crackles. ABDOMEN- Soft, nontender,  bowel sounds present. NEURO- No obvious Cr N abnormality EXTREMITIES- +1 pitting pedal edema. SKIN- Warm, dry, No rash or lesion. PSYCH- Normal mood and affect, appropriate thought content and speech.  Assessment & Plan:   The patient's case and plan of care was discussed with attending physician, Dr. Oswaldo Done.  Please see problem based charting  for assessment and plan.

## 2015-01-13 NOTE — Progress Notes (Signed)
Internal Medicine Clinic Attending  I saw and evaluated the patient.  I personally confirmed the key portions of the history and exam documented by Dr. Emokpae and I reviewed pertinent patient test results.  The assessment, diagnosis, and plan were formulated together and I agree with the documentation in the resident's note. 

## 2015-01-13 NOTE — Progress Notes (Signed)
Recheck wt with no jacket and no shoes 111.2lbs. PSO2 at room air while walking 100% to 99%. Stanton Kidney Nyelli Samara RN 01/13/15 4:30PM

## 2015-01-13 NOTE — Assessment & Plan Note (Signed)
Pt Daughter complaining that pt gets SOB with exertion. She says he noticed it about 5 times to day. Pt orthopnea has resolved, she now sleep with one pillow on a flat bed. She has minimal cough and hardly any sputum. Physical status has improved. Daughter endorses wheezing not improved with inhalers, but breathing sounds are clear, without wheezing or crakles. Pt appears very comfortable. Weight up today but likely spurious, as leg swelling and abdominal fullness are all getting better.  - Ambulatory O2 sats today and at last clinic visit- >99%. Observed pt working around, clinic, she does not appear SOB. - Dyspnea with exertion likely mutifactorial- Low Hgb ( She has follow up with nephrology early next week) -- Possible interstitial lung disease, planned for chest CT - high resolution next week, hopefully respiratory status remains stable. - Follow up next week. - Cont lasix  daily, prescription sent. - Stop miralax for now. - Complete 5 days of Azithro and prednisone - Family declined Home health, apparently has been trid before but language barrier made it difficult.

## 2015-01-13 NOTE — Patient Instructions (Signed)
We will get a CT Scan of your chest in 2-3 weeks, to find out why you are having some shortness of breath.  Also for now do not take the miralax. Take it only when you feel constipated.   We will see you in about 1 week. Continue taking your other medications. Check your weight everyday. It is very important you follow up with your kidney doctor.

## 2015-01-13 NOTE — Addendum Note (Signed)
Addended by: Erlinda Hong T on: 01/13/2015 01:36 PM   Modules accepted: Level of Service

## 2015-01-14 NOTE — Progress Notes (Signed)
Internal Medicine Clinic Attending  Case discussed with Dr. Emokpae at the time of the visit.  We reviewed the resident's history and exam and pertinent patient test results.  I agree with the assessment, diagnosis, and plan of care documented in the resident's note.  

## 2015-01-14 NOTE — Telephone Encounter (Signed)
Steroid-Induced Hyperglycemia Prevention and Management Bianca [name] is a 81 y.o. female who meets criteria for [facility] quality improvement program (diabetes patient prescribed short courses of oral steroids). Spoke to patient's daughter, who assists at home with patient's care.  A/P Current Regimen  Friday evening 01/10/15, patient prescribed prednisone 40 mg daily x 5 days, currently on day 4 of therapy.   Patient taking prednisone in the AM  Current DM regimen: insulin glargine and aspart  Home BG Monitoring  Patient does check BG at home and does have a meter at home.   CBGs at home: daughter states she is in the 100's except for 1 over 200 today during office visit  CBGs prior to steroid course 100s, A1C prior to steroid course 6.8  Patient does not report s/sx of hyper- or hypoglycemia  Medication Management  Additional treatment for BG control is not indicated at this time.  Patient Education  Advised daughter to monitor BG while on steroid therapy (at least twice daily prior to first 2 meals of the day).  Patient's daughter was educated about signs/symptoms and advised to contact clinic if hyper- or hypoglycemic.  Patient's daughter did verbalize understanding of information and regimen by repeating back topics discussed.  Follow-up Patient's daughter prefers to contact me if home BG > 200.  Bianca [name] J 6:25 PM 01/14/2015

## 2015-01-14 NOTE — Addendum Note (Signed)
Addended by: Erlinda Hong T on: 01/14/2015 02:26 PM   Modules accepted: Level of Service

## 2015-01-14 NOTE — Progress Notes (Signed)
Internal Medicine Clinic Attending  Case discussed with Dr. Emokpae soon after the resident saw the patient.  We reviewed the resident's history and exam and pertinent patient test results.  I agree with the assessment, diagnosis, and plan of care documented in the resident's note. 

## 2015-01-15 NOTE — Addendum Note (Signed)
Addended by: Amir Fick J on: 01/15/2015 11:42 AM   Modules accepted: Orders, Medications  

## 2015-01-17 ENCOUNTER — Encounter: Payer: Self-pay | Admitting: Internal Medicine

## 2015-01-17 ENCOUNTER — Telehealth: Payer: Self-pay | Admitting: *Deleted

## 2015-01-17 ENCOUNTER — Ambulatory Visit (INDEPENDENT_AMBULATORY_CARE_PROVIDER_SITE_OTHER): Payer: Medicare Other | Admitting: Internal Medicine

## 2015-01-17 VITALS — BP 129/61 | HR 70 | Temp 98.2°F | Ht 61.0 in | Wt 115.2 lb

## 2015-01-17 DIAGNOSIS — E119 Type 2 diabetes mellitus without complications: Secondary | ICD-10-CM

## 2015-01-17 DIAGNOSIS — N185 Chronic kidney disease, stage 5: Secondary | ICD-10-CM | POA: Diagnosis present

## 2015-01-17 DIAGNOSIS — Z23 Encounter for immunization: Secondary | ICD-10-CM

## 2015-01-17 DIAGNOSIS — I255 Ischemic cardiomyopathy: Secondary | ICD-10-CM | POA: Diagnosis not present

## 2015-01-17 DIAGNOSIS — K5909 Other constipation: Secondary | ICD-10-CM

## 2015-01-17 LAB — GLUCOSE, CAPILLARY: Glucose-Capillary: 122 mg/dL — ABNORMAL HIGH (ref 65–99)

## 2015-01-17 MED ORDER — FUROSEMIDE 40 MG PO TABS: 40.0000 mg | ORAL_TABLET | Freq: Two times a day (BID) | ORAL | Status: DC

## 2015-01-17 MED ORDER — BAYER MICROLET LANCETS MISC: Status: DC

## 2015-01-17 MED ORDER — POLYETHYLENE GLYCOL 3350 17 G PO PACK: 17.0000 g | PACK | Freq: Every day | ORAL | Status: AC | PRN

## 2015-01-17 NOTE — Patient Instructions (Signed)
Please increase the dose of your Lasix/water pill to  two times a day.  Also take your miralax as needed for constipation.   It is very important you check your weight everyday.  Continue with your insulin the way you are taking it.

## 2015-01-17 NOTE — Assessment & Plan Note (Signed)
4th visit to the clinic since the 8th of this month. Today pt daughter is complaining of body swelling. Exam- +2 pitting pedal edema, arms appear normal, face normal, abdominal fullness. Weight today increased 115, increased from 108- 01/10/2015. No orthopnea, no crackles on exam. O2 sats - 1005 in clinic. Pt also says he is constipated, tried having a bowel movement today, very hard, and has hardly been able to pass stools, does not take fruits or vegetables  Plan-  Increase lasix to  BID - Miralax- Once every two days/PRN only - Increase fruits and vegetables - Bmet next visit - If pt appears euvolemic, or stable/weight decreased to baseline, consider getting a high resolution CT scan to evaluate dyspnea, consider low DLCO on last PFTs( though pt was anemic and unsure of her fluid status at the time test was done). - Encourage daily weight - Suspect care giver fatigue, hence frequent clinic visits, and complaints. Family come around to help her during the weekend. Had HH, but due to language barrier they had to cancel services.

## 2015-01-17 NOTE — Progress Notes (Signed)
Patient ID: Bianca [name], female   DOB: [birth year], 81 y.o.   MRN: [id number]   Subjective:   Patient ID: Bianca [name] female   DOB: Feb 13, 1931 81 y.o.   MRN: [id number]  HPI: [name] is a 81 y.o. with PMH listed below. Presented today with complaints of body swelling that started today. Pts daughter- is the interpreter today. Pts daughter says he noticed today that her mothers body started swelling, she has been complaint with her lasix  daily. She has not been checking her weight. Pt pt still sleeps on one pillow with her bed flat. Daughter thinks patients body- arms, legs, tummy are swollen. Also says that this morning mother was wheezing very loudly. Pt also endorses cough which is unchanged from prior, minimally productive of whitish sputum. No SOB.  Past Medical History  Diagnosis Date  . GERD (gastroesophageal reflux disease)   . Hepatitis B   . Hyperlipemia   . Hypertension   . Cervical dysplasia   . Atrophic vaginitis   . Spinal stenosis     s/p decompression  . CAD (coronary artery disease) April 2008    s/p CABG;    cath 8/12:  LM patent, pLAD occluded, OM1 30-40%, pOM2 80% and 60% after the anastomosis, pRCA occluded, S-DX occluded (old), S-OM 2 occluded (new), L-LAD patent, dLAD provided collats to the PDA; Med Rx  rec.; consider PCI to OM2 if fails med Rx  . Hx of hysterectomy     daughter, Rocky Crafts, denies this hx on 08/23/2014  . UTI (lower urinary tract infection)   . Diverticulosis of intestine 04/29/2014    Of ascending colon noted on CT abd/ pelvis 04/20/14  . Diabetes mellitus, type 2     insulin dependent  . COPD (chronic obstructive pulmonary disease)     Hattie Perch 08/23/2014  . Chronic kidney disease (CKD), stage IV (severe)     Hattie Perch 08/23/2014  . CHF (congestive heart failure)     Hattie Perch 08/23/2014  . Chronic lower back pain   . Shortness of breath dyspnea   . UTI (urinary tract infection) 09/2014   Current Outpatient Prescriptions  Medication Sig  Dispense Refill  . albuterol (PROAIR HFA) 108 (90 BASE) MCG/ACT inhaler INHALE 1 PUFF EVERY 6 HOURS AS NEEDED FOR WHEEZE 8.5 each 2  . amLODipine (NORVASC) 10 MG tablet Take 1 tablet (10 mg total) by mouth daily. 90 tablet 1  . aspirin EC 81 MG tablet TAKE 1 TABLET BY MOUTH EVERY DAY 150 tablet 1  . B-D ULTRAFINE III SHORT PEN 31G X 8 MM MISC USE AS DIRECTED TO INJECT INSULIN 3 TIMES A DAY 100 each 6  . BAYER MICROLET LANCETS lancets Use as instructed 200 each 5  . benzonatate (TESSALON PERLES) 100 MG capsule Take 1 capsule (100 mg total) by mouth 3 (three) times daily as needed for cough. 30 capsule 0  . carvedilol (COREG) 25 MG tablet TAKE 1 TABLET (25 MG TOTAL) BY MOUTH 2 (TWO) TIMES DAILY WITH A MEAL. 120 tablet 3  . cholecalciferol (VITAMIN D) 1000 UNITS tablet Take 2 tablets (2,000 Units total) by mouth daily. 60 tablet 3  . CRESTOR 20 MG tablet TAKE 1 TABLET (20 MG TOTAL) BY MOUTH DAILY. 90 tablet 4  . Diapers & Supplies [facility] Christel Mormon L/XL) MISC 1 Units by Does not apply route daily as needed. 30 each 1  . docusate sodium (COLACE) 250 MG capsule Take 250 mg by mouth daily.    Marland Kitchen  furosemide (LASIX) 40 MG tablet Take 1 tablet (40 mg total) by mouth 2 (two) times daily. 30 tablet 1  . glucose blood (BAYER CONTOUR NEXT TEST) test strip 1 each by Other route as needed for other. Use 4 to 5 times daily to check blood sugar. diag code E11.9. Insulin dependent 200 each 5  . hydrALAZINE (APRESOLINE) 25 MG tablet Take 1 tablet (25 mg total) by mouth 3 (three) times daily. 270 tablet 1  . insulin aspart (NOVOLOG FLEXPEN) 100 UNIT/ML FlexPen Inject 6 Units into the skin 3 (three) times daily with meals. 100 mL 9  . insulin glargine (LANTUS) 100 UNIT/ML injection Inject 0.06 mLs (6 Units total) into the skin at bedtime. 10 mL 5  . isosorbide mononitrate (IMDUR) 120 MG 24 hr tablet Take 120 mg by mouth daily.  1  . nitroGLYCERIN (NITROSTAT) 0.4 MG SL tablet Place 0.4 mg under the tongue every 5 (five)  minutes as needed. For chest pain. Maximum of 3 doses    . polyethylene glycol (MIRALAX / GLYCOLAX) packet Take 17 g by mouth daily as needed. 14 each 0  . simethicone (GAS-X) 80 MG chewable tablet Chew 1 tablet (80 mg total) by mouth every 6 (six) hours as needed for flatulence. 30 tablet 0  . sodium bicarbonate 650 MG tablet Take 1 tablet (650 mg total) by mouth 2 (two) times daily. (Patient taking differently: Take 650 mg by mouth 3 (three) times daily. ) 30 tablet 0   No current facility-administered medications for this visit.   No family history on file. Social History   Social History  . Marital Status: Widowed    Spouse Name: N/A  . Number of Children: N/A  . Years of Education: N/A   Social History Main Topics  . Smoking status: Never Smoker   . Smokeless tobacco: Never Used  . Alcohol Use: No  . Drug Use: No  . Sexual Activity: Not Asked   Other Topics Concern  . None   Social History Narrative   Chinese immigrant   Housewife   Widowed   Lives w/ her son   Review of Systems: CONSTITUTIONAL- No Fever,  change in appetite. SKIN- No Rash, colour changes or itching. HEAD- No Headache or dizziness. RESPIRATORY- No Cough or SOB. CARDIAC- No Palpitations, or chest pain. GI- No  vomiting, diarrhoea,  abd pain. URINARY- No Frequency, or dysuria. NEUROLOGIC- No Numbness, syncope PYSCH- Denies depression or anxiety.  Objective:  Physical Exam: Filed Vitals:   01/17/15 1515  BP: 129/61  Pulse: 70  Temp: 98.2 F (36.8 C)  TempSrc: Oral  Height: 5\' 1"  (1.549 m)  Weight: 115 lb 3.2 oz (52.254 kg)  SpO2: 100%   GENERAL- alert, co-operative, appears as stated age, not in any distress. HEENT- Atraumatic, normocephalic, PERRL, EOMI, oral mucosa appears moist,  CARDIAC- RRR, no murmurs, rubs or gallops. RESP-Clear to auscultation bilaterally, no wheezes or crackles. ABDOMEN- Soft, nontender,  bowel sounds present. Abdomen appears full. BACK- Normal curvature of the  spine, No tenderness along the vertebrae, no CVA tenderness. NEURO- No obvious Cr N abnormality, strenght upper and lower extremities EXTREMITIES- pulse 2+, symmetric, +2 pitting pedal edema to mid chins, no obvious swelling of upper arms. SKIN- Warm, dry, No rash or lesion. PSYCH- Normal mood and affect, appropriate thought content and speech.  Assessment & Plan:   The patient's case and plan of care was discussed with attending physician, Dr. Dalphine Handing.  Please see problem based charting for assessment and  plan.

## 2015-01-17 NOTE — Assessment & Plan Note (Signed)
4th visit to the clinic since the 8th of this month. Today pt daughter is complaining of body swelling. Exam- +2 pitting pedal edema, arms appear normal, face normal, abdominal fullness. Weight today increased 115, increased from 108- 01/10/2015. No orthopnea, no crackles on exam. O2 sats - 1005 in clinic. Pt also says he is constipated, tried having a bowel movement today, very hard, and has hardly been able to pass stools, does not take fruits or vegetables  Plan-  Increase lasix to  BID - Miralax- Once every two days/PRN only - Increase fruits and vegetables - Bmet next visit - If pt appears euvolemic, or stable/weight decreased to baseline, consider getting a high resolution CT scan to evaluate dyspnea, consider low DLCO on last PFTs( though pt was anemic and unsure of her fluid status at the time test was done). - Encourage daily weight - Suspect care giver fatigue, hence frequent clinic visits, and complaints. Family come around to help her during the weekend. Had HH, but due to language barrier they had to cancel services. - Follow up with nephrology for Epo

## 2015-01-17 NOTE — Progress Notes (Signed)
Internal Medicine Clinic Attending  Case discussed with Dr. Emokpae soon after the resident saw the patient.  We reviewed the resident's history and exam and pertinent patient test results.  I agree with the assessment, diagnosis, and plan of care documented in the resident's note. 

## 2015-01-17 NOTE — Telephone Encounter (Signed)
Pt's daughter calls and states that pt is coughing very bad and sweating for 2 days, due to language barrier it is hard to ask more questions. appt at 1445 dr patel

## 2015-01-17 NOTE — Telephone Encounter (Signed)
Agree, thanks

## 2015-01-23 ENCOUNTER — Ambulatory Visit (INDEPENDENT_AMBULATORY_CARE_PROVIDER_SITE_OTHER): Payer: Medicare Other | Admitting: Internal Medicine

## 2015-01-23 ENCOUNTER — Encounter: Payer: Self-pay | Admitting: Internal Medicine

## 2015-01-23 VITALS — BP 120/56 | HR 69 | Temp 98.0°F | Wt 103.9 lb

## 2015-01-23 DIAGNOSIS — J449 Chronic obstructive pulmonary disease, unspecified: Secondary | ICD-10-CM | POA: Diagnosis not present

## 2015-01-23 DIAGNOSIS — I5042 Chronic combined systolic (congestive) and diastolic (congestive) heart failure: Secondary | ICD-10-CM | POA: Diagnosis present

## 2015-01-23 DIAGNOSIS — J439 Emphysema, unspecified: Secondary | ICD-10-CM

## 2015-01-23 DIAGNOSIS — Z7982 Long term (current) use of aspirin: Secondary | ICD-10-CM

## 2015-01-23 DIAGNOSIS — N185 Chronic kidney disease, stage 5: Secondary | ICD-10-CM

## 2015-01-23 DIAGNOSIS — E1122 Type 2 diabetes mellitus with diabetic chronic kidney disease: Secondary | ICD-10-CM

## 2015-01-23 MED ORDER — PEN NEEDLES 32G X 4 MM MISC: 1.0000 | Freq: Every day | Status: AC

## 2015-01-23 MED ORDER — ONETOUCH DELICA LANCETS FINE MISC: 1.0000 | Freq: Every day | Status: AC

## 2015-01-23 NOTE — Patient Instructions (Signed)
PLEASE TAKE LASIX 40 MG (1 PILL) ONCE A DAY  IF WEIGHT INCREASES >108 POUNDS TAKE LASIX 40 MG TWICE A DAY UNTIL WEIGHT IS BETWEEN 103 AND 108.

## 2015-01-23 NOTE — Progress Notes (Signed)
Subjective:    Patient ID: Bianca [name], female    DOB: 05-11-1930, 81 y.o.   MRN: [id number]  HPI Bianca [name] is a 81 y.o. female with PMHx of Hepatitis B, CAD, T2DM, and chronic combined CHF who presents to the clinic for follow up for her CHF. Please see A&P for the status of the patient's chronic medical problems.   Past Medical History  Diagnosis Date  . GERD (gastroesophageal reflux disease)   . Hepatitis B   . Hyperlipemia   . Hypertension   . Cervical dysplasia   . Atrophic vaginitis   . Spinal stenosis     s/p decompression  . CAD (coronary artery disease) April 2008    s/p CABG;    cath 8/12:  LM patent, pLAD occluded, OM1 30-40%, pOM2 80% and 60% after the anastomosis, pRCA occluded, S-DX occluded (old), S-OM 2 occluded (new), L-LAD patent, dLAD provided collats to the PDA; Med Rx  rec.; consider PCI to OM2 if fails med Rx  . Hx of hysterectomy     daughter, Rocky Crafts, denies this hx on 08/23/2014  . UTI (lower urinary tract infection)   . Diverticulosis of intestine 04/29/2014    Of ascending colon noted on CT abd/ pelvis 04/20/14  . Diabetes mellitus, type 2     insulin dependent  . COPD (chronic obstructive pulmonary disease)     Hattie Perch 08/23/2014  . Chronic kidney disease (CKD), stage IV (severe)     Hattie Perch 08/23/2014  . CHF (congestive heart failure)     Hattie Perch 08/23/2014  . Chronic lower back pain   . Shortness of breath dyspnea   . UTI (urinary tract infection) 09/2014    Outpatient Encounter Prescriptions as of 01/23/2015  Medication Sig  . albuterol (PROAIR HFA) 108 (90 BASE) MCG/ACT inhaler INHALE 1 PUFF EVERY 6 HOURS AS NEEDED FOR WHEEZE  . amLODipine (NORVASC) 10 MG tablet Take 1 tablet (10 mg total) by mouth daily.  Marland Kitchen aspirin EC 81 MG tablet TAKE 1 TABLET BY MOUTH EVERY DAY  . B-D ULTRAFINE III SHORT PEN 31G X 8 MM MISC USE AS DIRECTED TO INJECT INSULIN 3 TIMES A DAY  . BAYER MICROLET LANCETS lancets Use as instructed  . benzonatate (TESSALON PERLES) 100 MG  capsule Take 1 capsule (100 mg total) by mouth 3 (three) times daily as needed for cough.  . carvedilol (COREG) 25 MG tablet TAKE 1 TABLET (25 MG TOTAL) BY MOUTH 2 (TWO) TIMES DAILY WITH A MEAL.  . cholecalciferol (VITAMIN D) 1000 UNITS tablet Take 2 tablets (2,000 Units total) by mouth daily.  . CRESTOR 20 MG tablet TAKE 1 TABLET (20 MG TOTAL) BY MOUTH DAILY.  Marland Kitchen Diapers & Supplies [facility] Christel Mormon L/XL) MISC 1 Units by Does not apply route daily as needed.  . docusate sodium (COLACE) 250 MG capsule Take 250 mg by mouth daily.  . furosemide (LASIX) 40 MG tablet Take 1 tablet (40 mg total) by mouth 2 (two) times daily.  Marland Kitchen glucose blood (BAYER CONTOUR NEXT TEST) test strip 1 each by Other route as needed for other. Use 4 to 5 times daily to check blood sugar. diag code E11.9. Insulin dependent  . hydrALAZINE (APRESOLINE) 25 MG tablet Take 1 tablet (25 mg total) by mouth 3 (three) times daily.  . insulin aspart (NOVOLOG FLEXPEN) 100 UNIT/ML FlexPen Inject 6 Units into the skin 3 (three) times daily with meals.  . insulin glargine (LANTUS) 100 UNIT/ML injection Inject 0.06 mLs (6  Units total) into the skin at bedtime.  . isosorbide mononitrate (IMDUR) 120 MG 24 hr tablet Take 120 mg by mouth daily.  . nitroGLYCERIN (NITROSTAT) 0.4 MG SL tablet Place 0.4 mg under the tongue every 5 (five) minutes as needed. For chest pain. Maximum of 3 doses  . polyethylene glycol (MIRALAX / GLYCOLAX) packet Take 17 g by mouth daily as needed.  . simethicone (GAS-X) 80 MG chewable tablet Chew 1 tablet (80 mg total) by mouth every 6 (six) hours as needed for flatulence.  . sodium bicarbonate 650 MG tablet Take 1 tablet (650 mg total) by mouth 2 (two) times daily. (Patient taking differently: Take 650 mg by mouth 3 (three) times daily. )   No facility-administered encounter medications on file as of 01/23/2015.    No family history on file.  Social History   Social History  . Marital Status: Widowed    Spouse  Name: N/A  . Number of Children: N/A  . Years of Education: N/A   Occupational History  . Not on file.   Social History Main Topics  . Smoking status: Never Smoker   . Smokeless tobacco: Never Used  . Alcohol Use: No  . Drug Use: No  . Sexual Activity: Not on file   Other Topics Concern  . Not on file   Social History Narrative   Chinese immigrant   Housewife   Widowed   Lives w/ her son   Review of Systems General: Denies fever, chills, fatigue, weight gain Respiratory: Denies SOB, cough, DOE  Cardiovascular: Denies chest pain and palpitations.  Skin: Denies pallor, rash and wounds.  Neurological: Denies dizziness, headaches, weakness, lightheadedness Psychiatric/Behavioral: Denies mood changes, confusion, nervousness, sleep disturbance and agitation.     Objective:   Physical Exam Filed Vitals:   01/23/15 1425  BP: 120/56  Pulse: 69  Temp: 98 F (36.7 C)  TempSrc: Oral  Weight: 103 lb 14.4 oz (47.129 kg)  SpO2: 100%   General: Vital signs reviewed.  Patient is well-developed and well-nourished, in no acute distress and cooperative with exam.  Neck: No JVD Cardiovascular: RRR, S1 normal, S2 normal, no murmurs, gallops, or rubs. Pulmonary/Chest: Clear to auscultation bilaterally, no wheezes, rales, or rhonchi. Abdominal: Soft, non-tender, non-distended, BS + Extremities: No lower extremity edema bilaterally, pulses symmetric and intact bilaterally. No cyanosis or clubbing.  Skin: Warm, dry and intact. No rashes or erythema. Psychiatric: Normal mood and affect. speech and behavior is normal. Cognition and memory are normal.     Assessment & Plan:   Please see problem based assessment and plan.

## 2015-01-23 NOTE — Assessment & Plan Note (Signed)
Patient was seen one week ago for weight gain and volume overload secondary to CHF. Lasix was increased from 40 mg daily to 40 mg BID. Weight was 115 at that time. Normal weight for patient is between 104 and 108. Today, patient is 103.8. She denies any shortness of breath or edema. Lungs are clear on examination and there is no evidence of JVD or lower extremity edema.   Plan: -Decrease Lasix to 40 mg daily -If weight increases above 110, patient should increase lasix to 40 mg BID and decrease to QD when weight is back in normal range of 104-108 -Recheck BMET

## 2015-01-23 NOTE — Assessment & Plan Note (Signed)
Ordered high resolution CT Chest

## 2015-01-24 LAB — BMP8+ANION GAP
ANION GAP: 21 mmol/L — AB (ref 10.0–18.0)
BUN / CREAT RATIO: 17 (ref 11–26)
BUN: 67 mg/dL — AB (ref 8–27)
CO2: 21 mmol/L (ref 18–29)
CREATININE: 3.87 mg/dL — AB (ref 0.57–1.00)
Calcium: 8.9 mg/dL (ref 8.7–10.3)
Chloride: 103 mmol/L (ref 97–108)
GFR calc Af Amer: 12 mL/min/{1.73_m2} — ABNORMAL LOW (ref >59)
GFR, EST NON AFRICAN AMERICAN: 10 mL/min/{1.73_m2} — AB (ref >59)
Glucose: 75 mg/dL (ref 65–99)
Potassium: 4.5 mmol/L (ref 3.5–5.2)
SODIUM: 145 mmol/L — AB (ref 134–144)

## 2015-01-24 NOTE — Progress Notes (Signed)
Internal Medicine Clinic Attending  Case discussed with Dr. Richardson at the time of the visit.  We reviewed the resident's history and exam and pertinent patient test results.  I agree with the assessment, diagnosis, and plan of care documented in the resident's note. 

## 2015-01-30 ENCOUNTER — Ambulatory Visit (HOSPITAL_COMMUNITY)
Admission: RE | Admit: 2015-01-30 | Discharge: 2015-01-30 | Disposition: A | Discharge status: Home / Self Care | Payer: Medicare Other | Source: Ambulatory Visit | Attending: Oncology | Admitting: Oncology

## 2015-01-30 ENCOUNTER — Encounter (HOSPITAL_COMMUNITY): Payer: Self-pay

## 2015-01-30 DIAGNOSIS — Z951 Presence of aortocoronary bypass graft: Secondary | ICD-10-CM | POA: Diagnosis not present

## 2015-01-30 DIAGNOSIS — I517 Cardiomegaly: Secondary | ICD-10-CM | POA: Insufficient documentation

## 2015-01-30 DIAGNOSIS — I2510 Atherosclerotic heart disease of native coronary artery without angina pectoris: Secondary | ICD-10-CM | POA: Diagnosis not present

## 2015-01-30 DIAGNOSIS — J90 Pleural effusion, not elsewhere classified: Secondary | ICD-10-CM | POA: Diagnosis not present

## 2015-01-30 DIAGNOSIS — J439 Emphysema, unspecified: Secondary | ICD-10-CM

## 2015-01-30 DIAGNOSIS — J449 Chronic obstructive pulmonary disease, unspecified: Secondary | ICD-10-CM | POA: Diagnosis not present

## 2015-01-30 DIAGNOSIS — K219 Gastro-esophageal reflux disease without esophagitis: Secondary | ICD-10-CM | POA: Diagnosis not present

## 2015-01-30 DIAGNOSIS — R0602 Shortness of breath: Secondary | ICD-10-CM | POA: Diagnosis present

## 2015-01-31 ENCOUNTER — Ambulatory Visit (HOSPITAL_COMMUNITY): Payer: Medicare Other

## 2015-02-12 ENCOUNTER — Encounter (HOSPITAL_COMMUNITY): Payer: Medicare Other

## 2015-02-13 ENCOUNTER — Encounter (HOSPITAL_COMMUNITY)
Admission: RE | Admit: 2015-02-13 | Discharge: 2015-02-13 | Disposition: A | Discharge status: Home / Self Care | Payer: Medicare Other | Source: Ambulatory Visit | Attending: Nephrology | Admitting: Nephrology

## 2015-02-13 DIAGNOSIS — D631 Anemia in chronic kidney disease: Secondary | ICD-10-CM | POA: Insufficient documentation

## 2015-02-13 DIAGNOSIS — N185 Chronic kidney disease, stage 5: Secondary | ICD-10-CM | POA: Insufficient documentation

## 2015-02-13 DIAGNOSIS — Z5181 Encounter for therapeutic drug level monitoring: Secondary | ICD-10-CM | POA: Insufficient documentation

## 2015-02-13 DIAGNOSIS — Z79899 Other long term (current) drug therapy: Secondary | ICD-10-CM | POA: Insufficient documentation

## 2015-02-13 LAB — POCT HEMOGLOBIN-HEMACUE: Hemoglobin: 7.8 g/dL — ABNORMAL LOW (ref 12.0–15.0)

## 2015-02-13 MED ORDER — EPOETIN ALFA 20000 UNIT/ML IJ SOLN
INTRAMUSCULAR | Status: AC
  Administered 2015-02-13: 15000 [IU] via SUBCUTANEOUS
  Filled 2015-02-13: qty 1

## 2015-02-13 MED ORDER — EPOETIN ALFA 20000 UNIT/ML IJ SOLN
15000.0000 [IU] | INTRAMUSCULAR | Status: DC
  Administered 2015-02-13: 15000 [IU] via SUBCUTANEOUS

## 2015-02-13 NOTE — Discharge Instructions (Signed)
Epoetin Alfa injection What is this medicine? EPOETIN ALFA (e POE e tin AL fa) helps your body make more red blood cells. This medicine is used to treat anemia caused by chronic kidney failure, cancer chemotherapy, or HIV-therapy. It may also be used before surgery if you have anemia. This medicine may be used for other purposes; ask your health care provider or pharmacist if you have questions. What should I tell my health care provider before I take this medicine? They need to know if you have any of these conditions: -blood clotting disorders -cancer patient not on chemotherapy -cystic fibrosis -heart disease, such as angina or heart failure -hemoglobin level of 12 g/dL or greater -high blood pressure -low levels of folate, iron, or vitamin B12 -seizures -an unusual or allergic reaction to erythropoietin, albumin, benzyl alcohol, hamster proteins, other medicines, foods, dyes, or preservatives -pregnant or trying to get pregnant -breast-feeding How should I use this medicine? This medicine is for injection into a vein or under the skin. It is usually given by a health care professional in a hospital or clinic setting. If you get this medicine at home, you will be taught how to prepare and give this medicine. Use exactly as directed. Take your medicine at regular intervals. Do not take your medicine more often than directed. It is important that you put your used needles and syringes in a special sharps container. Do not put them in a trash can. If you do not have a sharps container, call your pharmacist or healthcare provider to get one. Talk to your pediatrician regarding the use of this medicine in children. While this drug may be prescribed for selected conditions, precautions do apply. Overdosage: If you think you have taken too much of this medicine contact a poison control center or emergency room at once. NOTE: This medicine is only for you. Do not share this medicine with  others. What if I miss a dose? If you miss a dose, take it as soon as you can. If it is almost time for your next dose, take only that dose. Do not take double or extra doses. What may interact with this medicine? Do not take this medicine with any of the following medications: -darbepoetin alfa This list may not describe all possible interactions. Give your health care provider a list of all the medicines, herbs, non-prescription drugs, or dietary supplements you use. Also tell them if you smoke, drink alcohol, or use illegal drugs. Some items may interact with your medicine. What should I watch for while using this medicine? Visit your prescriber or health care professional for regular checks on your progress and for the needed blood tests and blood pressure measurements. It is especially important for the doctor to make sure your hemoglobin level is in the desired range, to limit the risk of potential side effects and to give you the best benefit. Keep all appointments for any recommended tests. Check your blood pressure as directed. Ask your doctor what your blood pressure should be and when you should contact him or her. As your body makes more red blood cells, you may need to take iron, folic acid, or vitamin B supplements. Ask your doctor or health care provider which products are right for you. If you have kidney disease continue dietary restrictions, even though this medication can make you feel better. Talk with your doctor or health care professional about the foods you eat and the vitamins that you take. What side effects may I notice   from receiving this medicine? Side effects that you should report to your doctor or health care professional as soon as possible: -allergic reactions like skin rash, itching or hives, swelling of the face, lips, or tongue -breathing problems -changes in vision -chest pain -confusion, trouble speaking or understanding -feeling faint or lightheaded,  falls -high blood pressure -muscle aches or pains -pain, swelling, warmth in the leg -rapid weight gain -severe headaches -sudden numbness or weakness of the face, arm or leg -trouble walking, dizziness, loss of balance or coordination -seizures (convulsions) -swelling of the ankles, feet, hands -unusually weak or tired Side effects that usually do not require medical attention (report to your doctor or health care professional if they continue or are bothersome): -diarrhea -fever, chills (flu-like symptoms) -headaches -nausea, vomiting -redness, stinging, or swelling at site where injected This list may not describe all possible side effects. Call your doctor for medical advice about side effects. You may report side effects to FDA at 1-800-FDA-1088. Where should I keep my medicine? Keep out of the reach of children. Store in a refrigerator between 2 and 8 degrees C (36 and 46 degrees F). Do not freeze or shake. Throw away any unused portion if using a single-dose vial. Multi-dose vials can be kept in the refrigerator for up to 21 days after the initial dose. Throw away unused medicine. NOTE: This sheet is a summary. It may not cover all possible information. If you have questions about this medicine, talk to your doctor, pharmacist, or health care provider.    2016, Elsevier/Gold Standard. (2008-04-02 10:25:44)  

## 2015-02-18 ENCOUNTER — Ambulatory Visit (INDEPENDENT_AMBULATORY_CARE_PROVIDER_SITE_OTHER): Payer: Medicare Other | Admitting: Internal Medicine

## 2015-02-18 VITALS — BP 130/55 | HR 64 | Temp 97.7°F | Ht 61.0 in | Wt 108.9 lb

## 2015-02-18 DIAGNOSIS — R062 Wheezing: Secondary | ICD-10-CM | POA: Diagnosis not present

## 2015-02-18 DIAGNOSIS — R5383 Other fatigue: Secondary | ICD-10-CM | POA: Diagnosis not present

## 2015-02-18 DIAGNOSIS — J029 Acute pharyngitis, unspecified: Secondary | ICD-10-CM | POA: Diagnosis not present

## 2015-02-18 DIAGNOSIS — R05 Cough: Secondary | ICD-10-CM

## 2015-02-18 DIAGNOSIS — M549 Dorsalgia, unspecified: Secondary | ICD-10-CM

## 2015-02-18 DIAGNOSIS — J069 Acute upper respiratory infection, unspecified: Secondary | ICD-10-CM

## 2015-02-18 LAB — POCT URINALYSIS DIPSTICK
Bilirubin, UA: NEGATIVE
Blood, UA: NEGATIVE
Glucose, UA: NEGATIVE
Ketones, UA: NEGATIVE
Nitrite, UA: NEGATIVE
Protein, UA: 100
Spec Grav, UA: 1.02
UROBILINOGEN UA: 0.2
pH, UA: 7

## 2015-02-18 LAB — GLUCOSE, CAPILLARY: Glucose-Capillary: 89 mg/dL (ref 65–99)

## 2015-02-18 MED ORDER — BENZONATATE 100 MG PO CAPS: 100.0000 mg | ORAL_CAPSULE | Freq: Four times a day (QID) | ORAL | Status: DC | PRN

## 2015-02-18 NOTE — Patient Instructions (Signed)
-   Try using tessalon pearls for cough - Try drinking soup broth or tea to help your sore throat - Drink plenty of fluids - Consider the Care Connection program  General Instructions:   Please bring your medicines with you each time you come to clinic.  Medicines may include prescription medications, over-the-counter medications, herbal remedies, eye drops, vitamins, or other pills.   Progress Toward Treatment Goals:  Treatment Goal 12/19/2014  Hemoglobin A1C at goal  Blood pressure at goal  Prevent falls -    Self Care Goals & Plans:  Self Care Goal 01/23/2015  Manage my medications take my medicines as prescribed; bring my medications to every visit; refill my medications on time; follow the sick day instructions if I am sick  Monitor my health keep track of my blood glucose; bring my glucose meter and log to each visit; keep track of my blood pressure; check my feet daily  Eat healthy foods eat more vegetables; eat fruit for snacks and desserts; eat baked foods instead of fried foods; eat smaller portions; drink diet soda or water instead of juice or soda  Be physically active find an activity I enjoy  Meeting treatment goals -    Home Blood Glucose Monitoring 12/19/2014  Check my blood sugar 3 times a day  When to check my blood sugar before meals     Care Management & Community Referrals:  Referral 11/05/2014  Referrals made for care management support social worker  Referrals made to community resources -

## 2015-02-21 ENCOUNTER — Other Ambulatory Visit: Payer: Self-pay | Admitting: Licensed Clinical Social Worker

## 2015-02-21 ENCOUNTER — Encounter: Payer: Self-pay | Admitting: Internal Medicine

## 2015-02-21 DIAGNOSIS — I5042 Chronic combined systolic (congestive) and diastolic (congestive) heart failure: Secondary | ICD-10-CM

## 2015-02-21 DIAGNOSIS — J069 Acute upper respiratory infection, unspecified: Secondary | ICD-10-CM | POA: Insufficient documentation

## 2015-02-21 NOTE — Progress Notes (Signed)
Internal Medicine Clinic Attending  Case discussed with Dr. Rivet soon after the resident saw the patient.  We reviewed the resident's history and exam and pertinent patient test results.  I agree with the assessment, diagnosis, and plan of care documented in the resident's note.  

## 2015-02-21 NOTE — Progress Notes (Signed)
   Subjective:    Patient ID: Bianca [name], female    DOB: Aug 02, 1930, 81 y.o.   MRN: [id number]  HPI [name] is a 81yo woman with PMHx of HTN, chronic combined CHF, COPD, CKD stage 5 not on HD, and type 2 DM who presents here today for symptoms of sore throat and cough.  History is obtained through daughter as patient is [place] speaking. Daughter states for the past 1-2 days her mother has had a sore throat, dry cough, wheezing, and fatigue. She was seen in the clinic 5 times last month for similar symptoms which were attributed to her COPD and volume overload from heart failure. Patient states her cough is not productive and that she just "feels unwell in general." She denies any fevers, chills, congestion, chest pain, or shortness of breath. Her weight is stable at 108 lbs which is at goal for her. No sick contacts at home.  Patient is also complaining of mild back pain. She has a history of chronic back pain that is controlled with Tylenol. She states her pain is similar to her chronic pain. She denies any dysuria, hematuria, and frequency.    Review of Systems General: Denies night sweats, changes in weight, changes in appetite HEENT: Denies headaches, ear pain, changes in vision CV: Denies palpitations, orthopnea Pulm: See HPI GI: Denies abdominal pain, nausea, vomiting, diarrhea, constipation, melena, hematochezia GU: Denies dysuria, hematuria, frequency Msk: Denies muscle cramps, joint pains Neuro: Denies weakness, numbness, tingling Skin: Denies rashes, bruising Psych: Denies depression, anxiety, hallucinations    Objective:   Physical Exam General: alert, sitting up, NAD HEENT: [place]/AT, EOMI, sclera anicteric, pharynx mildly erythematous, sinuses nontender to palpation CV: RRR, no m/g/r Pulm: CTA bilaterally, breaths non-labored, no wheezing appreciated Abd: BS+, soft, non-tender Ext: warm, no peripheral edema Neuro: alert and oriented x 3     Assessment & Plan:    Please refer to A&P documentation.

## 2015-02-21 NOTE — Assessment & Plan Note (Signed)
Stable. Will check urine dipstick to rule out UTI as she is complaining of fatigue and feels "unwell." - Check UA>> negative for UTI

## 2015-02-21 NOTE — Assessment & Plan Note (Signed)
Her symptoms seem consistent with an upper respiratory infection which is most likely viral. She is afebrile and lungs clear so unlikely to be COPD exacerbation.  Recommended symptomatic treatment with tessalon pearls for cough and to drink hot soups/broths/tea for her sore throat. Instructed to return to clinic if her symptoms do not improve in the next week.   I agree with Dr. Mariea ClontsEmokpae, who saw her numerous times last month, that there is some degree of caregiver fatigue. I provided information to the daughter on the [facility] program which provides palliative care to the patient and family. Given the patient's numerous chronic medical conditions including chronic combined congestive heart failure and end stage renal disease, I believe the patient and her family would benefit from this program. The daughter is very interested in this program and she has agreed for me to discuss this with our social worker, Edson SnowballShana, to have this set up for them. I told her to call the number provided on the pamphlet if she does not hear from the [facility] program in the next 1-2 weeks. I will talk with Edson SnowballShana and have this set up.

## 2015-02-27 ENCOUNTER — Encounter (HOSPITAL_COMMUNITY)
Admission: RE | Admit: 2015-02-27 | Discharge: 2015-02-27 | Disposition: A | Discharge status: Home / Self Care | Payer: Medicare Other | Source: Ambulatory Visit | Attending: Family Medicine | Admitting: Family Medicine

## 2015-02-27 DIAGNOSIS — N185 Chronic kidney disease, stage 5: Secondary | ICD-10-CM | POA: Diagnosis not present

## 2015-02-27 LAB — POCT HEMOGLOBIN-HEMACUE: Hemoglobin: 8.5 g/dL — ABNORMAL LOW (ref 12.0–15.0)

## 2015-02-27 MED ORDER — EPOETIN ALFA 20000 UNIT/ML IJ SOLN
15000.0000 [IU] | INTRAMUSCULAR | Status: DC
  Administered 2015-02-27: 15000 [IU] via SUBCUTANEOUS

## 2015-02-27 MED ORDER — EPOETIN ALFA 20000 UNIT/ML IJ SOLN
INTRAMUSCULAR | Status: AC
  Filled 2015-02-27: qty 1

## 2015-03-10 ENCOUNTER — Telehealth: Payer: Self-pay

## 2015-03-10 NOTE — Telephone Encounter (Signed)
Rec'd call from Tammy RN with [facility].  Tammy reporting pt not feeling well, having no energy.  Recent lab work, as well as visit report reviewed by Dr. Rogelia BogaButcher.   Per Dr. Rogelia BogaButcher, [facility] for hgb, BMP, and phos level to be done at her next home visit on Wednesday.  Also, scheduled pt to be seen in clinic 11/8 at 315.

## 2015-03-11 ENCOUNTER — Ambulatory Visit (INDEPENDENT_AMBULATORY_CARE_PROVIDER_SITE_OTHER): Payer: Medicare Other | Admitting: Internal Medicine

## 2015-03-11 ENCOUNTER — Encounter: Payer: Self-pay | Admitting: Internal Medicine

## 2015-03-11 VITALS — BP 135/59 | HR 66 | Temp 97.8°F | Ht 61.0 in | Wt 111.3 lb

## 2015-03-11 DIAGNOSIS — N185 Chronic kidney disease, stage 5: Secondary | ICD-10-CM | POA: Diagnosis not present

## 2015-03-11 DIAGNOSIS — E1122 Type 2 diabetes mellitus with diabetic chronic kidney disease: Secondary | ICD-10-CM

## 2015-03-11 DIAGNOSIS — N184 Chronic kidney disease, stage 4 (severe): Secondary | ICD-10-CM | POA: Diagnosis present

## 2015-03-11 DIAGNOSIS — I10 Essential (primary) hypertension: Secondary | ICD-10-CM | POA: Diagnosis not present

## 2015-03-11 DIAGNOSIS — I255 Ischemic cardiomyopathy: Secondary | ICD-10-CM

## 2015-03-11 DIAGNOSIS — I120 Hypertensive chronic kidney disease with stage 5 chronic kidney disease or end stage renal disease: Secondary | ICD-10-CM

## 2015-03-11 DIAGNOSIS — J069 Acute upper respiratory infection, unspecified: Secondary | ICD-10-CM | POA: Diagnosis not present

## 2015-03-11 LAB — GLUCOSE, CAPILLARY
GLUCOSE-CAPILLARY: 93 mg/dL (ref 65–99)
Glucose-Capillary: 73 mg/dL (ref 65–99)

## 2015-03-11 MED ORDER — BENZONATATE 100 MG PO CAPS: 100.0000 mg | ORAL_CAPSULE | Freq: Four times a day (QID) | ORAL | Status: AC | PRN

## 2015-03-11 NOTE — Progress Notes (Signed)
   Patient ID: Donavan BurnetJieh T Woolman female   DOB: 1931/01/22 81 y.o.   MRN: [id number]  Subjective:   HPI: Ms.Kyndell T Cherly HensenChang is a 81 y.o. with PMH of HTN, combined CHF, COPD, CKD stage 5 not on HD, and T2DM who [facility] follow-up of her persistent fatigue.   Please see problem-based charting for status of medical issues pertinent to this visit.  Review of Systems: Pertinent items noted in HPI and remainder of comprehensive ROS otherwise negative.  Objective:  Physical Exam: Filed Vitals:   03/11/15 1529  BP: 135/59  Pulse: 66  Temp: 97.8 F (36.6 C)  TempSrc: Oral  Height: 5\' 1"  (1.549 m)  Weight: 111 lb 4.8 oz (50.485 kg)  SpO2: 100%   Gen: Well-appearing, alert and oriented to person, place, and time HEENT: Oropharynx clear without erythema or exudate.  Neck: No cervical LAD, no thyromegaly or nodules, no JVD noted. CV: Normal rate, regular rhythm, no murmurs, rubs, or gallops Pulmonary: Normal effort, CTA bilaterally, no wheezing, rales, or rhonchi Abdominal: Soft, non-tender, non-distended, without rebound, guarding, or masses Extremities: Distal pulses 2+ in upper and lower extremities bilaterally, no tenderness, or erythema. 2+ pitting to the midcalf bilaterally.  Skin: No atypical appearing moles. No rashes  Assessment & Plan:  Please see problem-based charting for assessment and plan.  Reubin MilanBilly Loredana Medellin, MD Resident Physician, PGY-1 Department of Internal Medicine [facility]

## 2015-03-11 NOTE — Patient Instructions (Addendum)
Please go back to taking the furosemide as directed (1 pill twice a day) for now to try and help with the breathing and swelling.  Please stop taking the short-acting 3 times per day insulin (novolog) because this is making her sugars get too low. Continue to take the lantus (long acting night time insulin) as you have been. You only need to check her sugars one time every morning. It is ok if her sugars get in the 200-300 range if that means her sugars are staying out of the 50s-70s range, which will make her feel much worse.  The only other change right now is to stop taking imdur (isosorbide mononitrate), because that probably isn't helping much at this point and can make her feel more tired as well.  Please go over the form we gave you with the family. We will talk with a translator at next visit as well so we can involve your mom in the conversation better.  You're doing a great job taking care of her!  Thanks, Reubin MilanBilly Woodward Klem

## 2015-03-11 NOTE — Assessment & Plan Note (Addendum)
Lab Results  Component Value Date   HGBA1C 6.8 12/19/2014   HGBA1C 6.9 09/03/2014   HGBA1C 7.3 06/19/2014     Assessment: Diabetes control:  well-controlled Progress toward A1C goal:   at goal Comments: Patient continues with symptoms of persistent fatigue and general malaise over the past few weeks, with 9 episodes of glucose under 70 in the past month. Besides persistent fatigue, patient has not had significant other hypoglycemic symptoms at this point. Last A1c was 6.8. While her persistent fatigue may be also due to worsening renal failure, she is on lantus 6 U and novolog 6 U TID with meals, which may be too much for her given her renal disease and low sugars and may be contributing to her current symptoms. Given her age and overall prognosis, a liberal and safer A1c goal of ~8 is perhaps more prudent for the patient at this time and may help her feel better.  Plan: Medications:  Will d/c the novolog but continue lantus 6U qhs. Home glucose monitoring: Frequency:   Timing:   Instruction/counseling given: reminded to bring blood glucose meter & log to each visit and reminded to bring medications to each visit Educational resources provided: handout Self management tools provided: copy of home glucose meter download Other plans: Will re-check A1c and glucose readings at next visit

## 2015-03-11 NOTE — Assessment & Plan Note (Signed)
BP Readings from Last 3 Encounters:  03/11/15 135/59  02/27/15 139/54  02/18/15 130/55    Lab Results  Component Value Date   NA 145* 01/23/2015   K 4.5 01/23/2015   CREATININE 3.87* 01/23/2015    Assessment: Blood pressure control:  well-controlled Progress toward BP goal:   at goal Comments: on amlodipine, carvedilol, furosemide, hydralazine  Plan: Medications:  continue current medications Educational resources provided: handout Self management tools provided:   Other plans:

## 2015-03-11 NOTE — Assessment & Plan Note (Addendum)
Patient has had several visits over the past 2 months for similar complaints of intermittent difficulty breathing, persistent fatigue, and general malaise. Exam shows 2+ pitting pedal edema, without pronounced upper extremity, facial, or abdominal swelling as previously seen. Weight is relatively stable today at 111 (108 is her rough baseline). No orthopnea or crackles on exam with O2 sats 99% in clinic today. BMP shows progressively worsening CKD, with most recent Cr now 5. Patient has been taking only 1/2 pill of lasix once daily recently (20mg  total). Discussed goals of care with daughter today who says the patient does not want to initiate dialysis at this time. -Resume lasix 40mg  BID -D/c imdur today - likely little benefit at this point and may also be contributing to chronic fatigue -BMP today -Daily weights -Continue with HH; daughter has had significant caregiver fatigue and is much appreciative of these services -Cont f/u with nephro -Encouraged patient and daughter to discuss goals of care with family, provided advanced directive forms and worksheets for them with plans to continue discussions at next visit in ~2 weeks

## 2015-03-12 LAB — BMP8+ANION GAP
Anion Gap: 20 mmol/L — ABNORMAL HIGH (ref 10.0–18.0)
BUN / CREAT RATIO: 13 (ref 11–26)
BUN: 48 mg/dL — ABNORMAL HIGH (ref 8–27)
CO2: 17 mmol/L — ABNORMAL LOW (ref 18–29)
CREATININE: 3.63 mg/dL — AB (ref 0.57–1.00)
Calcium: 8.5 mg/dL — ABNORMAL LOW (ref 8.7–10.3)
Chloride: 104 mmol/L (ref 97–106)
GFR calc Af Amer: 13 mL/min/{1.73_m2} — ABNORMAL LOW (ref >59)
GFR calc non Af Amer: 11 mL/min/{1.73_m2} — ABNORMAL LOW (ref >59)
Glucose: 61 mg/dL — ABNORMAL LOW (ref 65–99)
Potassium: 4.3 mmol/L (ref 3.5–5.2)
Sodium: 141 mmol/L (ref 136–144)

## 2015-03-12 NOTE — Progress Notes (Signed)
Internal Medicine Clinic Attending  I saw and evaluated the patient.  I personally confirmed the key portions of the history and exam documented by Dr. Kyung RuddKennedy and I reviewed pertinent patient test results.  The assessment, diagnosis, and plan were formulated together and I agree with the documentation in the resident's note.  Patient with CKD 5 and progressive symptoms from uremia and volume overload. She has consulted with Dr. Hyman HopesWebb from nephrology in the past and the patient is not interested in pursuing dialysis. Given progressive renal decline, prognosis is likely months which I explained to her family. We will discontinue short acting insulin because it is causing hypoglycemia, and stop long acting nitrate because of fatigue. Her other medications are mostly comfort oriented. Previous visits lists DNR/DNI decision reached. I gave the daughter our living will documentation, and outpatient palliative care has been arranged to start soon. Increase lasix today for volume overload.

## 2015-03-13 ENCOUNTER — Encounter (HOSPITAL_COMMUNITY): Payer: Medicare Other

## 2015-03-14 ENCOUNTER — Other Ambulatory Visit: Payer: Self-pay | Admitting: *Deleted

## 2015-03-14 ENCOUNTER — Telehealth: Payer: Self-pay | Admitting: Internal Medicine

## 2015-03-14 MED ORDER — ALBUTEROL SULFATE HFA 108 (90 BASE) MCG/ACT IN AERS: INHALATION_SPRAY | RESPIRATORY_TRACT | Status: AC

## 2015-03-14 NOTE — Telephone Encounter (Signed)
RN from [facility] called requesting [facility] the [facility]'s [facility]  RN also would like to know if Dr. Rogelia BogaButcher will be the Attending to sign off on this patient or would Dr. Rogelia BogaButcher prefer their Medical Staff to sign off.  Patient's family would also like for these orders to start today due to patient's declined in health.

## 2015-03-14 NOTE — Telephone Encounter (Signed)
Spoke w/ grdaughter [place] in [place] and states family says pt "looks bad" today, called [facility] and ask for the nurse to make a prn visit, grdaughter states pt is hesitant to go to ED because a doctor told a family member that there was nothing they could do there, even though pt is DNR she would still like to be treated? Just spoke to tammy from hospice, family does want hospice, if you will be the attending hospice will do symptom management

## 2015-03-14 NOTE — Telephone Encounter (Signed)
Spoke w/ dr Midwifebutcher, dr rivet will remain pcp w/ dr Science writerbutcher cosigning all documents for care/ hospice, informed tammy, she wasn't sure that hospice could do that, triage explained again that [facility] for [facility], she is agreeable

## 2015-03-14 NOTE — Telephone Encounter (Signed)
Thank you :)

## 2015-03-18 ENCOUNTER — Other Ambulatory Visit: Payer: Self-pay | Admitting: Internal Medicine

## 2015-03-20 ENCOUNTER — Inpatient Hospital Stay (HOSPITAL_COMMUNITY): Admission: RE | Admit: 2015-03-20 | Payer: Medicare Other | Source: Ambulatory Visit

## 2015-03-24 ENCOUNTER — Other Ambulatory Visit: Payer: Self-pay | Admitting: Internal Medicine

## 2015-03-25 ENCOUNTER — Encounter: Payer: Medicare Other | Admitting: Internal Medicine

## 2015-03-31 ENCOUNTER — Other Ambulatory Visit: Payer: Self-pay | Admitting: Internal Medicine

## 2015-04-08 ENCOUNTER — Other Ambulatory Visit: Payer: Self-pay | Admitting: Internal Medicine

## 2015-04-24 ENCOUNTER — Other Ambulatory Visit: Payer: Self-pay | Admitting: Internal Medicine

## 2015-05-21 ENCOUNTER — Other Ambulatory Visit: Payer: Self-pay | Admitting: Internal Medicine

## 2015-05-22 ENCOUNTER — Encounter: Payer: Self-pay | Admitting: Internal Medicine

## 2015-05-29 ENCOUNTER — Telehealth: Payer: Self-pay | Admitting: Internal Medicine

## 2015-05-29 NOTE — Telephone Encounter (Signed)
Gave verbal to Bianca [name] to continue hospice services for pt, do you approve?

## 2015-05-29 NOTE — Telephone Encounter (Signed)
Hospice nurse Tammy called for a VO to continue services for the patient.

## 2015-05-29 NOTE — Telephone Encounter (Signed)
Yes, I agree. Thank you!

## 2015-06-10 ENCOUNTER — Telehealth: Payer: Self-pay | Admitting: Internal Medicine

## 2015-06-10 NOTE — Telephone Encounter (Signed)
Yes, I agree. Thank you!

## 2015-06-10 NOTE — Telephone Encounter (Signed)
Spoke w/ Bianca [name], gave verbal to continue hospice care, do you agree?

## 2015-06-10 NOTE — Telephone Encounter (Signed)
Tammy from hospice requesting verbal order to continue hospice care for pt. Please call back.

## 2015-06-13 ENCOUNTER — Encounter: Payer: Self-pay | Admitting: Internal Medicine

## 2015-07-28 ENCOUNTER — Telehealth: Payer: Self-pay | Admitting: *Deleted

## 2015-07-28 NOTE — Telephone Encounter (Signed)
Received call from Hospice that pt is deceased.Bianca AlvineGoldston, Bianca Baltazar Cassady3/27/201710:23 AM

## 2015-08-02 DEATH — deceased

## 2015-10-25 IMAGING — DX DG CHEST 2V
2 series · 2 of 2 positions shown · non-contrast
Comparison: 04/23/2014

CLINICAL DATA: Shortness of breath, centralized chest pain.

EXAM:
CHEST  2 VIEW

[chest pa]
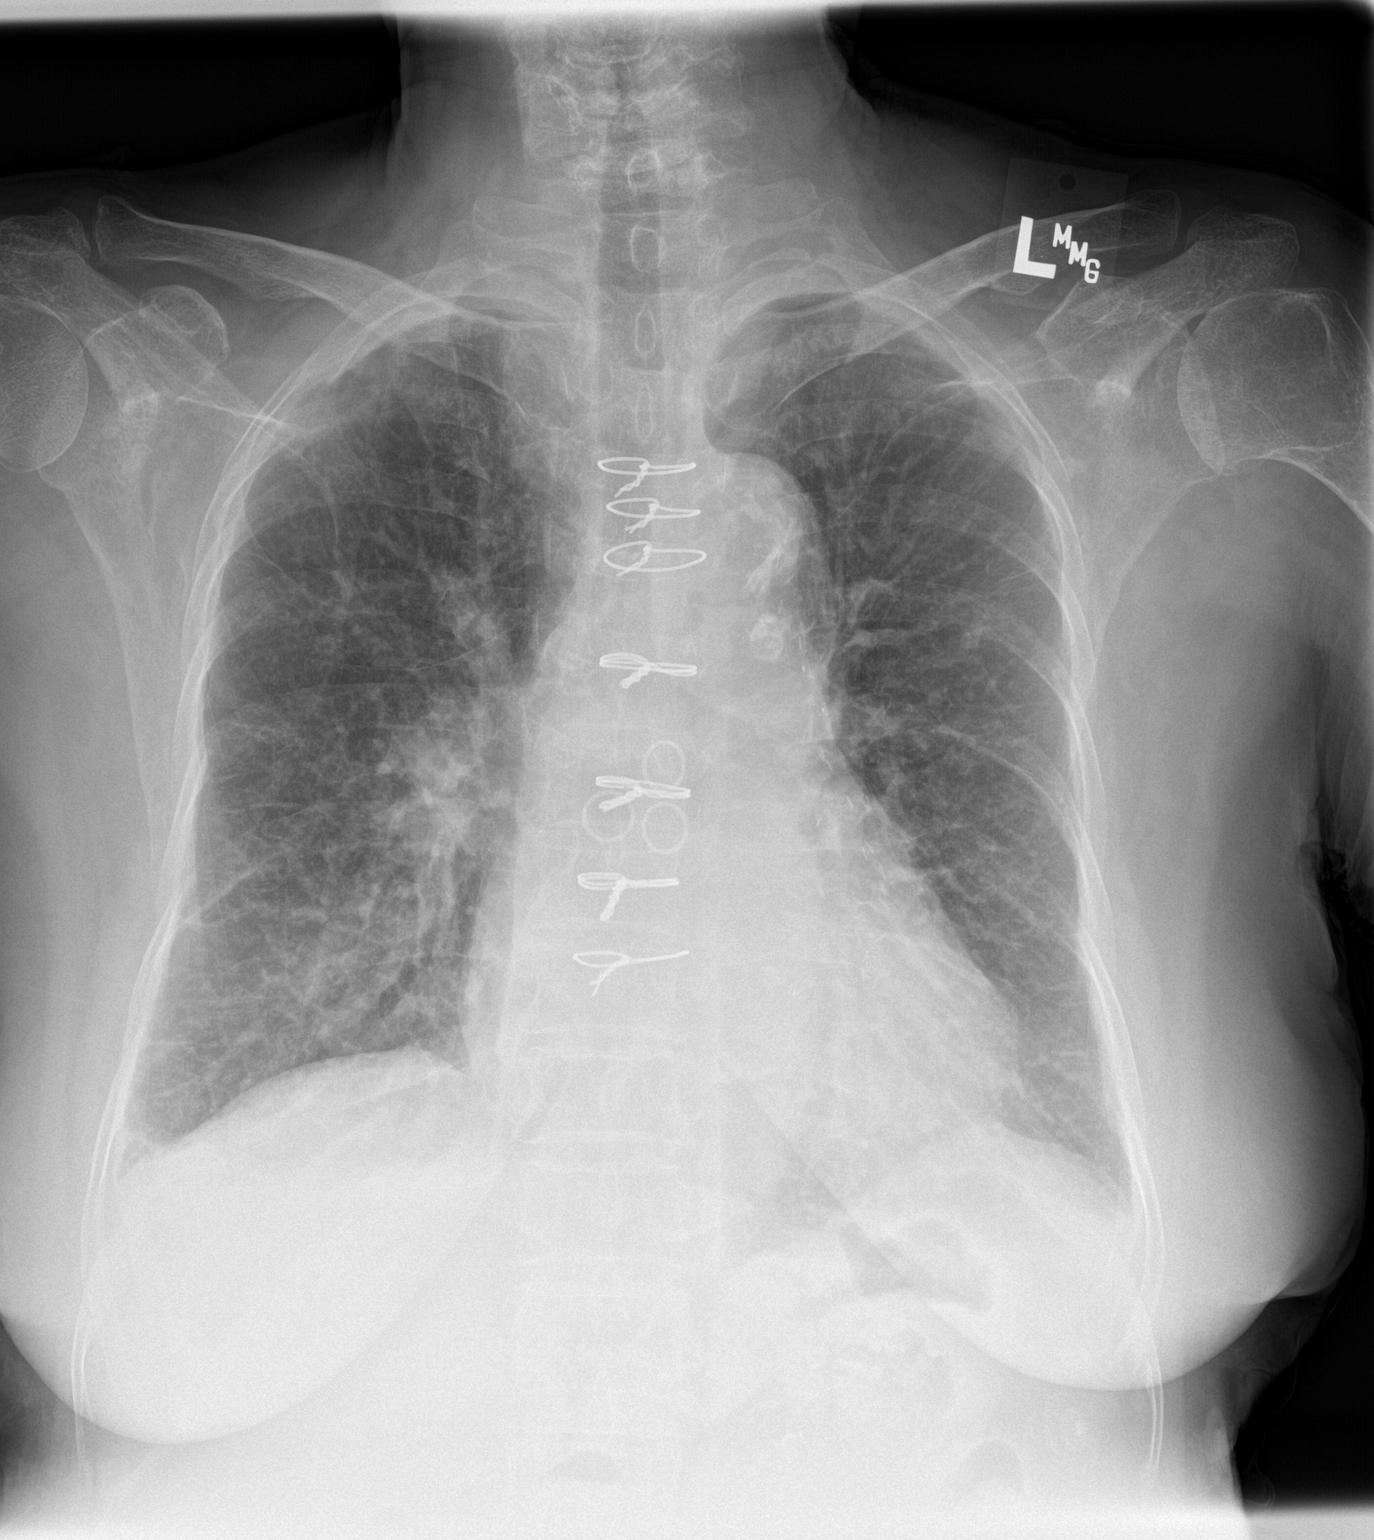

[chest lat]
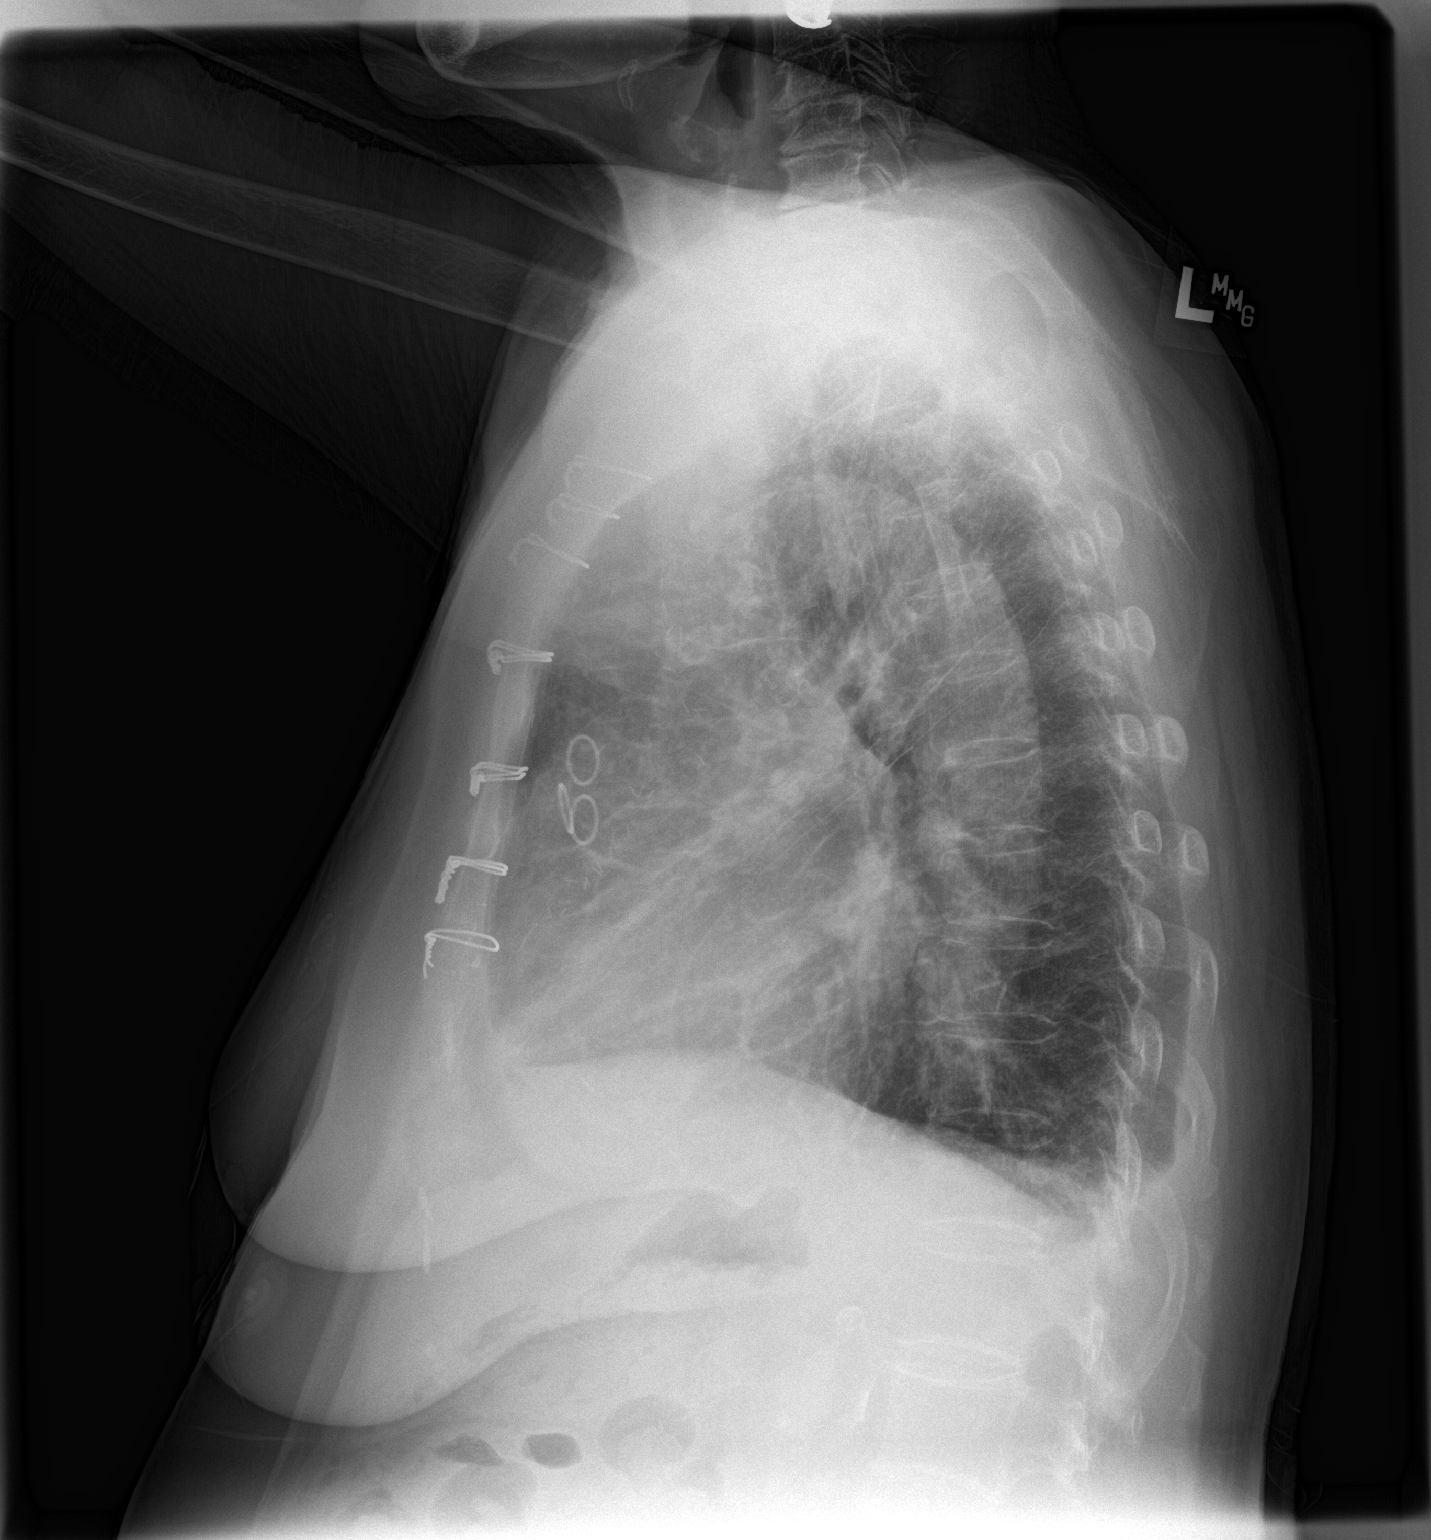

[2 of 2 positions shown; findings below may reference images not displayed]

FINDINGS: Prior CABG. Heart is borderline in size. Hyperinflation of the
lungs. Areas of scarring in the upper lobes and lung bases. No acute
airspace opacities or effusions No acute bony abnormality.
IMPRESSION: COPD/chronic changes.  No active disease.
